# Patient Record
Sex: Male | Born: 1955 | Race: White | Hispanic: No | Marital: Married | Smoking: Current every day smoker
Health system: Southern US, Community
[De-identification: ages and names within clinical notes are randomized; demographics above are authoritative.]

## PROBLEM LIST (undated history)

## (undated) DIAGNOSIS — L03114 Cellulitis of left upper limb: Secondary | ICD-10-CM

## (undated) DIAGNOSIS — R7401 Elevation of levels of liver transaminase levels: Secondary | ICD-10-CM

## (undated) DIAGNOSIS — F1010 Alcohol abuse, uncomplicated: Secondary | ICD-10-CM

## (undated) DIAGNOSIS — F4321 Adjustment disorder with depressed mood: Secondary | ICD-10-CM

## (undated) DIAGNOSIS — K769 Liver disease, unspecified: Secondary | ICD-10-CM

## (undated) DIAGNOSIS — N529 Male erectile dysfunction, unspecified: Secondary | ICD-10-CM

## (undated) DIAGNOSIS — G629 Polyneuropathy, unspecified: Secondary | ICD-10-CM

## (undated) DIAGNOSIS — J439 Emphysema, unspecified: Secondary | ICD-10-CM

## (undated) DIAGNOSIS — R7309 Other abnormal glucose: Secondary | ICD-10-CM

## (undated) DIAGNOSIS — J449 Chronic obstructive pulmonary disease, unspecified: Secondary | ICD-10-CM

## (undated) DIAGNOSIS — R740 Nonspecific elevation of levels of transaminase and lactic acid dehydrogenase [LDH]: Secondary | ICD-10-CM

## (undated) DIAGNOSIS — K219 Gastro-esophageal reflux disease without esophagitis: Secondary | ICD-10-CM

## (undated) DIAGNOSIS — E781 Pure hyperglyceridemia: Secondary | ICD-10-CM

## (undated) DIAGNOSIS — T7840XA Allergy, unspecified, initial encounter: Secondary | ICD-10-CM

## (undated) DIAGNOSIS — Z720 Tobacco use: Secondary | ICD-10-CM

## (undated) DIAGNOSIS — I10 Essential (primary) hypertension: Secondary | ICD-10-CM

## (undated) DIAGNOSIS — F419 Anxiety disorder, unspecified: Secondary | ICD-10-CM

## (undated) HISTORY — DX: Alcohol abuse, uncomplicated: F10.10

## (undated) HISTORY — DX: Cellulitis of left upper limb: L03.114

## (undated) HISTORY — DX: Elevation of levels of liver transaminase levels: R74.01

## (undated) HISTORY — DX: Allergy, unspecified, initial encounter: T78.40XA

## (undated) HISTORY — PX: KNEE SURGERY: SHX244

## (undated) HISTORY — DX: Essential (primary) hypertension: I10

## (undated) HISTORY — DX: Nonspecific elevation of levels of transaminase and lactic acid dehydrogenase (ldh): R74.0

## (undated) HISTORY — DX: Gastro-esophageal reflux disease without esophagitis: K21.9

## (undated) HISTORY — PX: TONSILLECTOMY: SUR1361

## (undated) HISTORY — PX: EYE SURGERY: SHX253

## (undated) HISTORY — DX: Male erectile dysfunction, unspecified: N52.9

## (undated) HISTORY — DX: Pure hyperglyceridemia: E78.1

## (undated) HISTORY — DX: Adjustment disorder with depressed mood: F43.21

## (undated) HISTORY — DX: Tobacco use: Z72.0

## (undated) HISTORY — DX: Other abnormal glucose: R73.09

---

## 2008-11-07 ENCOUNTER — Ambulatory Visit: Payer: Self-pay | Admitting: Internal Medicine

## 2012-07-21 ENCOUNTER — Inpatient Hospital Stay: Payer: Self-pay | Admitting: Internal Medicine

## 2012-07-21 LAB — BASIC METABOLIC PANEL
Co2: 26 mmol/L (ref 21–32)
Creatinine: 0.76 mg/dL (ref 0.60–1.30)
EGFR (African American): >60
Glucose: 97 mg/dL (ref 65–99)
Potassium: 3.9 mmol/L (ref 3.5–5.1)

## 2012-07-21 LAB — CBC
HGB: 15.1 g/dL (ref 13.0–18.0)
RDW: 12.5 % (ref 11.5–14.5)
WBC: 7.9 10*3/uL (ref 3.8–10.6)

## 2012-07-21 LAB — TROPONIN I: Troponin-I: <0.02 ng/mL

## 2014-04-05 HISTORY — PX: SEPTOPLASTY: SUR1290

## 2014-04-09 ENCOUNTER — Ambulatory Visit: Payer: Self-pay | Admitting: Unknown Physician Specialty

## 2014-06-25 NOTE — Discharge Summary (Signed)
PATIENT NAME:  Devin HusbandsLAUGHLIN, Vennie MR#:  [id number] DATE OF BIRTH:  Mar 28, 1955  DATE OF ADMISSION:  07/21/2012 DATE OF DISCHARGE:  07/22/2012  ADMISSION DIAGNOSIS: Acute chronic obstructive pulmonary disease exacerbation.   DISCHARGE DIAGNOSES: 1.  Acute chronic obstructive pulmonary disease exacerbation with acute respiratory failure.  2.  Tobacco dependence.  3.  Otitis externa.   CONSULTS: None.   LABORATORIES AT DISCHARGE: There were no new laboratories. Please refer to the laboratories from the [facility].   HOSPITAL COURSE: This is a very pleasant 59 year old male who presents for shortness of breath. Found to have acute exacerbation of COPD along with otitis externa.   For further details, please refer to Dr. Riley [facility]arw[facility]'s [facility].   Acute COPD exacerbation: The patient was started on IV steroids, nebulizers, O2 as needed, and inhalers. He actually did quite well. His lungs were clear to auscultation without any wheezing, with good air movement. He is not requiring any oxygen. His steroids are being weaned. He will continue his inhalers.   Acute otitis externa: We started Cipro eardrops, which he will need for a total of 7 days, with followup by his primary care physician in about a week.   Tobacco dependence: The patient did not want a nicotine patch at discharge, nor in the hospital. He was encouraged to try to quit to quit smoking.   Anxiety: The patient was discharged with his outpatient medications.   DISCHARGE MEDICATIONS: 1. Paxil 10 mg daily.  2.  Klonopin  0.5 mg b.i.d. p.r.n., anxiety.  3.  Prilosec 40 mg daily.  4.  Multivitamin 1 tablet daily.  5.  Prednisone taper starting at 60 mg taper x 10 mg every 2 days.  6.  Ciprofloxacin/dexamethasone, 4 drops to both ears.    ____________________________ Janyth ContesSital P. Juliene PinaMody, MD spm:dm D: 07/22/2012 14:20:17 ET T: 07/22/2012 14:53:57 ET JOB#: [id number]  cc: Tiant Peixoto P. Juliene PinaMody, MD, <Dictator> Janyth ContesSITAL P Caidence Kaseman MD ELECTRONICALLY SIGNED  07/23/2012 12:24

## 2014-06-25 NOTE — Discharge Summary (Signed)
PATIENT NAME:  Devin Bryant, Devin Bryant MR#:  [id number] DATE OF BIRTH:  [birth year]  DATE OF ADMISSION:  07/21/2012 DATE OF DISCHARGE:  07/22/2012  ADDENDUM:  This is a continuation of transaction [phone].   MEDICATIONS: (Starting again) 1.  Paxil 10 mg daily.  2.  Klonopin 0.5 mg b.i.d. p.r.n. anxiety.  3.  Prilosec 40 mg daily.  4.  Multivitamin 1 tablet daily.  5.  Prednisone starting at 60 mg, taper by 10 mg every 2 days.  6.  Ciprofloxacin dexamethasone 4 drops to both ears x 6 days b.i.d.  7.  Levaquin 500 mg 25 hours x 3 days.  8.  Fluticasone salmeterol 100/50, 1 puff b.i.d.   DISCHARGE DIET: Regular. Discharge consistency regular.   DISCHARGE ACTIVITY: As tolerated.   FOLLOWUP: The patient will need to follow up with Dr. Dossie Arbourrissman in 1 to 2 weeks.    ____________________________ Janyth ContesSital P. Juliene PinaMody, MD spm:cb D: 07/22/2012 14:22:20 ET T: 07/22/2012 15:35:45 ET JOB#: [id number]  cc: Brendy Ficek P. Juliene PinaMody, MD, <Dictator> Janyth ContesSITAL P Stepahnie Campo MD ELECTRONICALLY SIGNED 07/23/2012 12:26

## 2014-06-25 NOTE — H&P (Signed)
PATIENT NAME:  Devin Bryant, Devin Bryant MR#:  [id number] DATE OF BIRTH:  April 22, 1955  DATE OF ADMISSION:  07/21/2012  PRIMARY CARE PHYSICIAN: [facility].   REFERRING PHYSICIAN: Lucrezia Europe, MD   CHIEF COMPLAINT: Increased shortness of breath and wheezing.   HISTORY OF PRESENT ILLNESS: Devin Bryant is a 59 year old pleasant Caucasian male with history of chronic smoking.  He was in his usual state of health until the last couple of days when he started to have cough associated with increased shortness of breath which had exacerbated to develop wheezing and progressive shortness of breath. He has no fever, no chills, no chest pain. The patient was brought to the Emergency Department for evaluation. His chest x-ray was negative for pneumonia.  Treated with bronchodilator therapy.  However, he had partial response to the treatment. His O2 saturation on room air was 87%.   REVIEW OF SYSTEMS: CONSTITUTIONAL: Denies any fever. No chills. No fatigue.  EYES: No blurring of vision. No double vision.  ENT: No hearing impairment. No sore throat. No dysphagia.  CARDIOVASCULAR: No chest pain. No palpitations. No syncope.  RESPIRATORY: Reports shortness of breath, wheezing, progressive dyspnea and cough, mostly dry but with occasional sputum production. No hemoptysis.  GASTROENTEROLOGY:  No abdominal pain. No vomiting. No diarrhea.  GENITOURINARY: No dysuria. No frequency of urination.  MUSCULOSKELETAL: No joint pain or swelling. No muscular pain or swelling.  INTEGUMENTARY: No skin rash. No ulcers.  NEUROLOGY: No focal weakness. No seizure activity. No headache.  PSYCHIATRY: No depression but he has anxiety.  ENDOCRINE: No polyuria or polydipsia. No heat or cold intolerance.   PAST MEDICAL HISTORY: Chronic smoking, anxiety, gastroesophageal reflux disease.   PAST SURGICAL HISTORY: Arthroscopic knee surgery.   FAMILY HISTORY: His mother died at age of 17.  She suffered from diabetes mellitus.  His father died at the age of 72 after having heart attack.   SOCIAL HISTORY: He is married, living with his wife. He owns a [employer].  SOCIAL HABITS: Chronic smoker, 1-1/2 packs per day since age of 35. He drinks vodka, 2 drinks a day. No other drug abuse.   ADMISSION MEDICATIONS:  1.  Paxil 10 mg a day. 2.  Klonopin 0.5 mg twice a day as needed. 3.  Prilosec 40 mg a day. 4.  Multivitamin once a day.   ALLERGIES: No known drug allergies.   PHYSICAL EXAMINATION: VITAL SIGNS: Blood pressure 146/81, respiratory rate 24.  This had improved down to 20 at the time of my examination. Pulse is 80 per minute, temperature 98, oxygen saturation was 87% on room air. With oxygen it was 91 to 93%.  GENERAL APPEARANCE: Middle-aged male lying in bed in no acute distress.  HEAD: No pallor. No icterus. No cyanosis.  EARS: Normal hearing.  The right ear, external auditory meatus, showing redness and debris consistent with otitis externa.  NOSE AND THROAT:  Nose no discharge, no ulcers.  OROPHARYNGEAL:  Normal lips and tongue. No oral thrush. No exudate.  EYE:  Normal iris and conjunctivae. Pupils about 5 mm, equal, round and reactive to light.  NECK: Supple. Trachea at midline. No thyromegaly. No cervical lymphadenopathy. No masses.  HEART: Normal S1 and S2. Distant heart sounds. No murmur was appreciated. No gallop. No carotid bruits.  LUNGS: Normal breathing pattern at time of my examination without use of accessory muscles. No rales but he has diffuse wheezing, both inspiratory and expiratory, but mainly expiratory with prolonged expiratory phase. No rales.  ABDOMEN: Soft without tenderness.  No hepatosplenomegaly. No masses. No hernias.  SKIN: No ulcers. No subcutaneous nodules.  MUSCULOSKELETAL: No joint swelling. No clubbing.  NEUROLOGIC: Cranial nerves II through XII are intact. No focal motor deficit.  PSYCHIATRIC: The patient is alert and oriented x 3. Mood and affect were normal.   LABORATORY  AND DIAGNOSTICS:  Radiology:  Chest x-ray showed hyperinflation of the chest consistent with emphysema. No consultation. No effusion. Heart size was normal.   EKG showed normal rhythm at rate of 80 per minute. No ischemic abnormalities.   Serum glucose 97, BUN 10, creatinine 0.7, sodium 139, potassium 3.9, and calcium 8.9. Troponin less than 0.02. CBC showed white count of 7000, hemoglobin 15, hematocrit 43, and platelet count 201. MCV was slightly elevated at 102 with normal MCHC.   ASSESSMENT: 1.  Acute exacerbation of chronic obstructive pulmonary disease.  2.  Tobacco abuse.  3.  Otitis externa. 4.  Moderate alcoholism.  5.  Anxiety disorder.  6.  Gastroesophageal reflux disease.   PLAN: We will admit the patient to the medical floor, intensify treatment with bronchodilator therapy using DuoNebs q. 4 hours, IV Solu-Medrol, and intravenous Levaquin which will cover for the acute exacerbation of chronic obstructive pulmonary disease and also for his otitis externa. The patient needs to quit smoking and he was counseled with discussion for about 2 minutes and I spoke about modalities of smoking cessation and options. I also had discussion with his wife who was asking about modalities to help him quit smoking. I offered nicotine patch, but the patient declined.  He felt that he would be fine here in the hospital without need for nicotine patch, but he will buy later the electronic cigarette. We need to watch him for any signs of agitation or withdrawal from alcohol, although he feels that he is not drinking much and he did not anticipate that he will have any problems from alcohol withdrawal. Nevertheless, I understood that sometimes he may drink more than just 2 drinks a day. Continue home medications as listed above.   TIME SPENT: In evaluating this patient took more than 55 minutes.  ____________________________ Carney CornersAmir M. Rudene Rearwish, MD amd:sb D: 07/21/2012 06:34:09 ET T: 07/21/2012 08:19:05  ET JOB#: [id number]  cc: Carney CornersAmir M. Rudene Rearwish, MD, <Dictator> Zollie ScaleAMIR M Tam Delisle MD ELECTRONICALLY SIGNED 07/27/2012 23:32

## 2014-06-28 LAB — SURGICAL PATHOLOGY

## 2014-08-21 ENCOUNTER — Emergency Department
Admission: EM | Admit: 2014-08-21 | Discharge: 2014-08-21 | Disposition: A | Discharge status: Home / Self Care | Payer: BLUE CROSS/BLUE SHIELD | Attending: Emergency Medicine | Admitting: Emergency Medicine

## 2014-08-21 ENCOUNTER — Encounter: Payer: Self-pay | Admitting: Emergency Medicine

## 2014-08-21 DIAGNOSIS — S61306A Unspecified open wound of right little finger with damage to nail, initial encounter: Secondary | ICD-10-CM | POA: Insufficient documentation

## 2014-08-21 DIAGNOSIS — X58XXXA Exposure to other specified factors, initial encounter: Secondary | ICD-10-CM | POA: Diagnosis not present

## 2014-08-21 DIAGNOSIS — Z79899 Other long term (current) drug therapy: Secondary | ICD-10-CM | POA: Insufficient documentation

## 2014-08-21 DIAGNOSIS — Y999 Unspecified external cause status: Secondary | ICD-10-CM | POA: Diagnosis not present

## 2014-08-21 DIAGNOSIS — S61209A Unspecified open wound of unspecified finger without damage to nail, initial encounter: Secondary | ICD-10-CM

## 2014-08-21 DIAGNOSIS — Z720 Tobacco use: Secondary | ICD-10-CM | POA: Diagnosis not present

## 2014-08-21 DIAGNOSIS — Y929 Unspecified place or not applicable: Secondary | ICD-10-CM | POA: Insufficient documentation

## 2014-08-21 DIAGNOSIS — Z7951 Long term (current) use of inhaled steroids: Secondary | ICD-10-CM | POA: Diagnosis not present

## 2014-08-21 DIAGNOSIS — Z7982 Long term (current) use of aspirin: Secondary | ICD-10-CM | POA: Insufficient documentation

## 2014-08-21 DIAGNOSIS — Y939 Activity, unspecified: Secondary | ICD-10-CM | POA: Insufficient documentation

## 2014-08-21 DIAGNOSIS — S6991XA Unspecified injury of right wrist, hand and finger(s), initial encounter: Secondary | ICD-10-CM | POA: Diagnosis present

## 2014-08-21 HISTORY — DX: Chronic obstructive pulmonary disease, unspecified: J44.9

## 2014-08-21 MED ORDER — LIDOCAINE HCL (PF) 1 % IJ SOLN
INTRAMUSCULAR | Status: AC
  Filled 2014-08-21: qty 5

## 2014-08-21 NOTE — ED Provider Notes (Signed)
[facility] Emergency Department Provider Note  ____________________________________________  Time seen: Approximately 7:33 AM  I have reviewed the triage vital signs and the nursing notes.   HISTORY  Chief Complaint Laceration    HPI Devin Bryant is a 59 y.o. male presented with a laceration to the fifth digit patient is a papular last night. He said unable to control the bleeding which is small bruising. His police bleeding is secondary to being on aspirin. Patient state his tetanus shot is up-to-date. Patient rated his pain discomfort as a 5/10.  Past Medical History  Diagnosis Date  . COPD (chronic obstructive pulmonary disease)     There are no active problems to display for this patient.   Past Surgical History  Procedure Laterality Date  . Septoplasty      Current Outpatient Rx  Name  Route  Sig  Dispense  Refill  . albuterol (PROVENTIL HFA;VENTOLIN HFA) 108 (90 BASE) MCG/ACT inhaler   Inhalation   Inhale into the lungs every 6 (six) hours as needed for wheezing or shortness of breath.         Marland Kitchen aspirin 81 MG tablet   Oral   Take 81 mg by mouth daily.         . budesonide-formoterol (SYMBICORT) 160-4.5 MCG/ACT inhaler   Inhalation   Inhale 2 puffs into the lungs 2 (two) times daily.         . clonazePAM (KLONOPIN) 0.5 MG tablet   Oral   Take 0.5 mg by mouth 2 (two) times daily as needed for anxiety.         Marland Kitchen omeprazole (PRILOSEC) 20 MG capsule   Oral   Take 20 mg by mouth daily.         Marland Kitchen PARoxetine (PAXIL) 20 MG tablet   Oral   Take 20 mg by mouth daily.           Allergies Review of patient's allergies indicates no known allergies.  No family history on file.  Social History History  Substance Use Topics  . Smoking status: Current Every Day Smoker  . Smokeless tobacco: Not on file  . Alcohol Use: Yes    Review of Systems Constitutional: No fever/chills Eyes: No visual changes. ENT: No sore  throat. Cardiovascular: Denies chest pain. Respiratory: Denies shortness of breath. Gastrointestinal: No abdominal pain.  No nausea, no vomiting.  No diarrhea.  No constipation. Genitourinary: Negative for dysuria. Musculoskeletal: Negative for back pain. Skin: Avulsion injury to the fifth digit right hand. Neurological: Negative for headaches, focal weakness or numbness.  10-point ROS otherwise negative.  ____________________________________________   PHYSICAL EXAM:  VITAL SIGNS: ED Triage Vitals  Enc Vitals Group     BP 08/21/14 0717 145/91 mmHg     Pulse Rate 08/21/14 0717 105     Resp 08/21/14 0717 20     Temp 08/21/14 0717 98.2 F (36.8 C)     Temp Source 08/21/14 0717 Oral     SpO2 08/21/14 0717 96 %     Weight 08/21/14 0717 175 lb (79.379 kg)     Height 08/21/14 0717 5\' 10"  (1.778 m)     Head Cir --      Peak Flow --      Pain Score 08/21/14 0718 5     Pain Loc --      Pain Edu? --      Excl. in GC? --     Constitutional: Alert and oriented. Well appearing and in  no acute distress. Eyes: Conjunctivae are normal. PERRL. EOMI. Head: Atraumatic. Nose: No congestion/rhinnorhea. Mouth/Throat: Mucous membranes are moist.  Oropharynx non-erythematous. Neck: No stridor.  No deformity for nuchal range of motion Hematological/Lymphatic/Immunilogical: No cervical lymphadenopathy. Cardiovascular: Normal rate, regular rhythm. Grossly normal heart sounds.  Good peripheral circulation. Respiratory: Normal respiratory effort.  No retractions. Lungs CTAB. Gastrointestinal: Soft and nontender. No distention. No abdominal bruits. No CVA tenderness. Musculoskeletal: No lower extremity tenderness nor edema.  No joint effusions. Neurologic:  Normal speech and language. No gross focal neurologic deficits are appreciated. Speech is normal. No gait instability. Skin:  Skin is warm, dry and intact. No rash noted. Avulsion injury lateral aspect of the fifth finger Psychiatric: Mood and  affect are normal. Speech and behavior are normal.  ____________________________________________   LABS (all labs ordered are listed, but only abnormal results are displayed)  Labs Reviewed - No data to display ____________________________________________  EKG   ____________________________________________  RADIOLOGY   ____________________________________________   PROCEDURES  Procedure(s) performed: None  Critical Care performed: No  ____________________________________________   INITIAL IMPRESSION / ASSESSMENT AND PLAN / ED COURSE  Pertinent labs & imaging results that were available during my care of the patient were reviewed by me and considered in my medical decision making (see chart for details).  Avulsion injury fifth finger. Area clean, Surgicel applied and pressure dressing. ____________________________________________   FINAL CLINICAL IMPRESSION(S) / ED DIAGNOSES  Final diagnoses:  Avulsion of skin of finger, initial encounter      Joni Reining, PA-C 08/21/14 0756  Joni Reining, PA-C 08/21/14 1610  Darci Current, MD 08/23/14 2246

## 2014-08-21 NOTE — Discharge Instructions (Signed)
Deep Skin Avulsion Change outer dressing daily.  Wear glove when showering.  A deep skin avulsion is when all layers of the skin or parts of body structures have been torn away. This is usually a result of severe injury (trauma). A deep skin avulsion can include damage to important structures beneath the skin such as tendons, ligaments, nerves, or blood vessels.  CAUSES  Many injuries can lead to a deep skin avulsion. These include:   Crush injuries.  Bites.  Falls against jagged surfaces.  Gunshot wounds.  Severe burns and injuries involving dragging (such as those from a bicycle or motorcycle accident). TREATMENT   If the wound is small and there is no damage to vital structures like nerves and blood vessels, the damaged tissues may be removed. Then, the wound can be cleaned thoroughly and closed.  A skin graft may be performed. This is a procedure in which the outer layer of skin is removed from a different part of your body. That skin (skin graft) is used to cover the open wound. This can happen after damaged tissue is removed and repairs are completed.  Your caregiver may onlyapply a bandage (dressing) to the wound. The wound will be kept clean and allowed to heal. Healing can take weeks or months and usually leaves a large scar. This type of treatment is only done if your caregiver feels that skin grafting or a similar procedure would not work. You might need a tetanus shot if:  You cannot remember when you had your last tetanus shot.  You have never had a tetanus shot.  The injury broke your skin. If you got a tetanus shot, your arm may swell, get red, and feel warm to the touch. This is common and not a problem. If you need a tetanus shot and you choose not to have one, there is a rare chance of getting tetanus. Sickness from tetanus can be serious. HOME CARE INSTRUCTIONS   Only take over-the-counter or prescription medicines for pain, discomfort, or fever as directed by  your caregiver.  Gently wash the area with mild soap and water 2 times a day, or as directed. Rinse off the soap. Pat the area dry with a clean towel. Do not rub the wound. This may cause bleeding.  Follow your caregiver's instructions for how often you need to change the dressing.  Apply ointment and a dressing to the wound as directed.  If the dressing sticks, moisten it with soapy water and gently remove it.  Change the bandage right away if it becomes wet, dirty, or starts to smell bad.  Take showers. Do not take tub baths, swim, or do anything that may soak the wound until it is healed.  Use anti-itch medicine as directed by your caregiver. The wound may itch when it is healing. Do not pick or scratch at the wound.  Follow up with your caregiver for stitches (sutures), staple, or skin adhesive strip removal. SEEK MEDICAL CARE IF:   You have redness, swelling, or increasing pain in your wound.  A red streak or line extends away from the wound.  You have pus coming from the wound.  You notice a bad smell coming from thewound or dressing.  The wound breaks open (edges not staying together) after sutures have been removed.  You notice something coming out of the wound, such as a small piece of wood, glass, or metal.  You are unable to properly move a finger or toe if the wound  is on your hand or foot.  You have severe swelling around the wound that causes pain and numbness.  Your arm, hand, leg, or foot changes color. SEEK IMMEDIATE MEDICAL CARE IF:   Your pain becomes severe or is not adequately relieved with pain medicine.  You have a fever.  You have nausea and vomiting for more than 24 hours.  You feel lightheaded, weak, or faint.  You develop chest pain or difficulty breathing. MAKE SURE YOU:   Understand these instructions.  Will watch your condition.  Will get help right away if you are not doing well or get worse. Document Released: 04/17/2006 Document  Revised: 05/14/2011 Document Reviewed: 06/25/2010 [facility] Patient Information 2015 [place], [place]. This information is not intended to replace advice given to you by your health care provider. Make sure you discuss any questions you have with your health care provider.

## 2014-08-21 NOTE — ED Notes (Signed)
Patient to ER with laceration to right 5th finger. States happened last night, unable to control bleeding this am. Denies being on blood thinners other than low dose ASA.

## 2014-10-13 DIAGNOSIS — N529 Male erectile dysfunction, unspecified: Secondary | ICD-10-CM | POA: Insufficient documentation

## 2014-10-13 DIAGNOSIS — T7840XA Allergy, unspecified, initial encounter: Secondary | ICD-10-CM | POA: Insufficient documentation

## 2014-10-13 DIAGNOSIS — R7309 Other abnormal glucose: Secondary | ICD-10-CM | POA: Insufficient documentation

## 2014-10-13 DIAGNOSIS — Z720 Tobacco use: Secondary | ICD-10-CM | POA: Insufficient documentation

## 2014-10-13 DIAGNOSIS — I10 Essential (primary) hypertension: Secondary | ICD-10-CM | POA: Insufficient documentation

## 2014-10-13 DIAGNOSIS — R7401 Elevation of levels of liver transaminase levels: Secondary | ICD-10-CM | POA: Insufficient documentation

## 2014-10-13 DIAGNOSIS — E781 Pure hyperglyceridemia: Secondary | ICD-10-CM | POA: Insufficient documentation

## 2014-10-13 DIAGNOSIS — J449 Chronic obstructive pulmonary disease, unspecified: Secondary | ICD-10-CM | POA: Insufficient documentation

## 2014-10-13 DIAGNOSIS — R740 Nonspecific elevation of levels of transaminase and lactic acid dehydrogenase [LDH]: Secondary | ICD-10-CM

## 2014-10-13 DIAGNOSIS — F4321 Adjustment disorder with depressed mood: Secondary | ICD-10-CM | POA: Insufficient documentation

## 2014-10-15 ENCOUNTER — Ambulatory Visit (INDEPENDENT_AMBULATORY_CARE_PROVIDER_SITE_OTHER): Payer: BLUE CROSS/BLUE SHIELD | Admitting: Family Medicine

## 2014-10-15 ENCOUNTER — Encounter: Payer: Self-pay | Admitting: Family Medicine

## 2014-10-15 VITALS — BP 115/74 | HR 115 | Temp 97.9°F | Ht 70.2 in | Wt 171.0 lb

## 2014-10-15 DIAGNOSIS — F411 Generalized anxiety disorder: Secondary | ICD-10-CM

## 2014-10-15 DIAGNOSIS — J441 Chronic obstructive pulmonary disease with (acute) exacerbation: Secondary | ICD-10-CM | POA: Diagnosis not present

## 2014-10-15 DIAGNOSIS — B370 Candidal stomatitis: Secondary | ICD-10-CM

## 2014-10-15 MED ORDER — ALBUTEROL SULFATE (2.5 MG/3ML) 0.083% IN NEBU: 2.5000 mg | INHALATION_SOLUTION | RESPIRATORY_TRACT | Status: DC | PRN

## 2014-10-15 MED ORDER — CLONAZEPAM 0.5 MG PO TABS: 0.5000 mg | ORAL_TABLET | Freq: Two times a day (BID) | ORAL | Status: DC | PRN

## 2014-10-15 MED ORDER — UMECLIDINIUM BROMIDE 62.5 MCG/INH IN AEPB: 1.0000 | INHALATION_SPRAY | Freq: Every day | RESPIRATORY_TRACT | Status: DC

## 2014-10-15 MED ORDER — DOXYCYCLINE HYCLATE 100 MG PO TABS: 100.0000 mg | ORAL_TABLET | Freq: Two times a day (BID) | ORAL | Status: DC

## 2014-10-15 NOTE — Patient Instructions (Signed)
Start probiotics daily for about the next month Start the new antibiotic Use the new inhaler (sample) if needed while you are being sparing with the Advair Rest and hydration Vitamin C 1000 mg daily while you are ill Call or seek medication attention if needed

## 2014-10-15 NOTE — Progress Notes (Signed)
BP 115/74 mmHg  Pulse 115  Temp(Src) 97.9 F (36.6 C)  Ht 5' 10.2" (1.783 m)  Wt 171 lb (77.565 kg)  BMI 24.40 kg/m2  SpO2 91%   Subjective:    Patient ID: Devin Bryant, male    DOB: 05-Oct-1955, 59 y.o.   MRN: [id number]  HPI: Devin Bryant is a 59 y.o. male  Chief Complaint  Patient presents with  . URI    has been using neb at home  . Medication Refill   He has seen ENT and has thrush all over his vocal cords, it is all over; just covered; ENT started him on medicine He was on a breathing med that increased risk and ENT switched it No cancer and no nodules; getting better now Going on for 90 days or so [employer] made him switch inhalers before this happened He gave him 200 mg of diflucan for 14 days; he asked him to not use COPD medicine He is on the edge of bronchitis; using SABA 2-3 times a day; normally only 1x a week He is having yellow drainage and feels this like this infection No visits to nursing homes or hospitals He requested a refill of his anti-anxiety medicine  Relevant past medical, surgical, family and social history reviewed and updated as indicated. Interim medical history since our last visit reviewed. Allergies and medications reviewed and updated.  Review of Systems  Constitutional: Negative for fever.  HENT: Positive for sore throat, trouble swallowing and voice change.   Respiratory: Positive for cough, shortness of breath and wheezing.   Hematological: Positive for adenopathy (cervical range).   Per HPI unless specifically indicated above     Objective:    BP 115/74 mmHg  Pulse 115  Temp(Src) 97.9 F (36.6 C)  Ht 5' 10.2" (1.783 m)  Wt 171 lb (77.565 kg)  BMI 24.40 kg/m2  SpO2 91%  Wt Readings from Last 3 Encounters:  10/17/14 170 lb (77.111 kg)  10/15/14 171 lb (77.565 kg)  05/10/14 175 lb (79.379 kg)    Physical Exam  Constitutional: He appears well-developed and well-nourished. No distress.  HENT:  Head:  Normocephalic and atraumatic.  Eyes: EOM are normal. Right eye exhibits no discharge. Left eye exhibits no discharge. No scleral icterus.  Neck: Neck supple.  Cardiovascular: Regular rhythm.   No extrasystoles are present. Tachycardia present.   Pulmonary/Chest: Effort normal and breath sounds normal. No accessory muscle usage. No respiratory distress. He has no decreased breath sounds.  Lymphadenopathy:    He has no cervical adenopathy.  Skin: No rash noted. He is not diaphoretic. No pallor.  Psychiatric: His mood appears anxious (slightly anxious in appearance, but good eye contact with examiner).      Assessment & Plan:   Problem List Items Addressed This Visit      Respiratory   COPD with acute exacerbation - Primary    Will start antibiotics and add inhaler to use while he has stopped the inhaled corticosteroid; I opted against oral steroids given the extent of his thrush, and will only use that if absolutely necessary, obviously; he has rescue inhaler at home      Relevant Medications   ADVAIR HFA 115-21 MCG/ACT inhaler   Umeclidinium Bromide (INCRUSE ELLIPTA) 62.5 MCG/INH AEPB   albuterol (PROVENTIL) (2.5 MG/3ML) 0.083% nebulizer solution     Digestive   Oral thrush    Seen and diagnosed by ENT; continue diflucan per ENT's instructions; likely secondary to inhaled corticosteroid which has been stopped  for now      Relevant Medications   fluconazole (DIFLUCAN) 200 MG tablet     Other   Anxiety disorder    He promises he is not getting benzo from any other source; do not mix with other controlled substances; refill provided          Follow up plan: Return if symptoms worsen or fail to improve.

## 2014-10-17 ENCOUNTER — Emergency Department: Payer: BLUE CROSS/BLUE SHIELD

## 2014-10-17 ENCOUNTER — Inpatient Hospital Stay
Admission: EM | Admit: 2014-10-17 | Discharge: 2014-10-19 | DRG: 190 | Disposition: A | Discharge status: Home / Self Care | Payer: BLUE CROSS/BLUE SHIELD | Attending: Internal Medicine | Admitting: Internal Medicine

## 2014-10-17 DIAGNOSIS — F4321 Adjustment disorder with depressed mood: Secondary | ICD-10-CM | POA: Diagnosis present

## 2014-10-17 DIAGNOSIS — J9601 Acute respiratory failure with hypoxia: Secondary | ICD-10-CM

## 2014-10-17 DIAGNOSIS — J441 Chronic obstructive pulmonary disease with (acute) exacerbation: Secondary | ICD-10-CM | POA: Diagnosis present

## 2014-10-17 DIAGNOSIS — F411 Generalized anxiety disorder: Secondary | ICD-10-CM | POA: Insufficient documentation

## 2014-10-17 DIAGNOSIS — I10 Essential (primary) hypertension: Secondary | ICD-10-CM | POA: Diagnosis present

## 2014-10-17 DIAGNOSIS — F17210 Nicotine dependence, cigarettes, uncomplicated: Secondary | ICD-10-CM | POA: Diagnosis present

## 2014-10-17 DIAGNOSIS — J9600 Acute respiratory failure, unspecified whether with hypoxia or hypercapnia: Secondary | ICD-10-CM | POA: Diagnosis present

## 2014-10-17 DIAGNOSIS — Z7982 Long term (current) use of aspirin: Secondary | ICD-10-CM

## 2014-10-17 DIAGNOSIS — B370 Candidal stomatitis: Secondary | ICD-10-CM | POA: Diagnosis present

## 2014-10-17 DIAGNOSIS — E785 Hyperlipidemia, unspecified: Secondary | ICD-10-CM | POA: Diagnosis present

## 2014-10-17 DIAGNOSIS — Z833 Family history of diabetes mellitus: Secondary | ICD-10-CM

## 2014-10-17 DIAGNOSIS — K219 Gastro-esophageal reflux disease without esophagitis: Secondary | ICD-10-CM | POA: Diagnosis present

## 2014-10-17 DIAGNOSIS — Z801 Family history of malignant neoplasm of trachea, bronchus and lung: Secondary | ICD-10-CM

## 2014-10-17 DIAGNOSIS — Z720 Tobacco use: Secondary | ICD-10-CM | POA: Diagnosis present

## 2014-10-17 HISTORY — DX: Anxiety disorder, unspecified: F41.9

## 2014-10-17 LAB — CBC
HCT: 45.1 % (ref 40.0–52.0)
Hemoglobin: 15.2 g/dL (ref 13.0–18.0)
MCH: 36.6 pg — AB (ref 26.0–34.0)
MCHC: 33.7 g/dL (ref 32.0–36.0)
MCV: 108.5 fL — ABNORMAL HIGH (ref 80.0–100.0)
Platelets: 255 10*3/uL (ref 150–440)
RBC: 4.15 MIL/uL — AB (ref 4.40–5.90)
RDW: 14 % (ref 11.5–14.5)
WBC: 9.4 10*3/uL (ref 3.8–10.6)

## 2014-10-17 LAB — COMPREHENSIVE METABOLIC PANEL
ALT: 58 U/L (ref 17–63)
AST: 56 U/L — ABNORMAL HIGH (ref 15–41)
Albumin: 4.2 g/dL (ref 3.5–5.0)
Alkaline Phosphatase: 74 U/L (ref 38–126)
Anion gap: 10 (ref 5–15)
BUN: 6 mg/dL (ref 6–20)
CO2: 29 mmol/L (ref 22–32)
Calcium: 9.2 mg/dL (ref 8.9–10.3)
Chloride: 100 mmol/L — ABNORMAL LOW (ref 101–111)
Creatinine, Ser: 0.79 mg/dL (ref 0.61–1.24)
GLUCOSE: 128 mg/dL — AB (ref 65–99)
Potassium: 4.1 mmol/L (ref 3.5–5.1)
SODIUM: 139 mmol/L (ref 135–145)
Total Bilirubin: 0.8 mg/dL (ref 0.3–1.2)
Total Protein: 7.7 g/dL (ref 6.5–8.1)

## 2014-10-17 LAB — BLOOD GAS, ARTERIAL
ALLENS TEST (PASS/FAIL): POSITIVE — AB
Acid-Base Excess: 0.8 mmol/L (ref 0.0–3.0)
Bicarbonate: 26.6 mEq/L (ref 21.0–28.0)
FIO2: 0.3
O2 SAT: 91.4 %
PATIENT TEMPERATURE: 37
pCO2 arterial: 46 mmHg (ref 32.0–48.0)
pH, Arterial: 7.37 (ref 7.350–7.450)
pO2, Arterial: 64 mmHg — ABNORMAL LOW (ref 83.0–108.0)

## 2014-10-17 LAB — TROPONIN I: Troponin I: <0.03 ng/mL (ref <0.031)

## 2014-10-17 LAB — MAGNESIUM: Magnesium: 1.5 mg/dL — ABNORMAL LOW (ref 1.7–2.4)

## 2014-10-17 MED ORDER — FLUCONAZOLE 100 MG PO TABS
200.0000 mg | ORAL_TABLET | Freq: Every day | ORAL | Status: DC
  Administered 2014-10-17 – 2014-10-19 (×3): 200 mg via ORAL
  Filled 2014-10-17 (×3): qty 2
  Filled 2014-10-17: qty 1

## 2014-10-17 MED ORDER — NICOTINE 14 MG/24HR TD PT24
14.0000 mg | MEDICATED_PATCH | Freq: Every day | TRANSDERMAL | Status: DC
  Administered 2014-10-17 – 2014-10-19 (×3): 14 mg via TRANSDERMAL
  Filled 2014-10-17 (×3): qty 1

## 2014-10-17 MED ORDER — PREDNISONE 20 MG PO TABS
40.0000 mg | ORAL_TABLET | Freq: Every day | ORAL | Status: DC
  Administered 2014-10-19: 40 mg via ORAL
  Filled 2014-10-17: qty 2

## 2014-10-17 MED ORDER — ALBUTEROL SULFATE (2.5 MG/3ML) 0.083% IN NEBU: 3.0000 mL | INHALATION_SOLUTION | Freq: Four times a day (QID) | RESPIRATORY_TRACT | Status: DC | PRN

## 2014-10-17 MED ORDER — ALBUTEROL SULFATE (2.5 MG/3ML) 0.083% IN NEBU: 2.5000 mg | INHALATION_SOLUTION | RESPIRATORY_TRACT | Status: DC | PRN

## 2014-10-17 MED ORDER — HEPARIN SODIUM (PORCINE) 5000 UNIT/ML IJ SOLN
5000.0000 [IU] | Freq: Three times a day (TID) | INTRAMUSCULAR | Status: DC
  Administered 2014-10-17 – 2014-10-19 (×5): 5000 [IU] via SUBCUTANEOUS
  Filled 2014-10-17 (×5): qty 1

## 2014-10-17 MED ORDER — ACETAMINOPHEN 650 MG RE SUPP: 650.0000 mg | Freq: Four times a day (QID) | RECTAL | Status: DC | PRN

## 2014-10-17 MED ORDER — CLONAZEPAM 0.5 MG PO TABS
0.5000 mg | ORAL_TABLET | Freq: Two times a day (BID) | ORAL | Status: DC | PRN
  Administered 2014-10-17 – 2014-10-19 (×5): 0.5 mg via ORAL
  Filled 2014-10-17 (×6): qty 1

## 2014-10-17 MED ORDER — METHYLPREDNISOLONE SODIUM SUCC 125 MG IJ SOLR
60.0000 mg | Freq: Four times a day (QID) | INTRAMUSCULAR | Status: AC
  Administered 2014-10-17 – 2014-10-18 (×4): 60 mg via INTRAVENOUS
  Filled 2014-10-17 (×4): qty 2

## 2014-10-17 MED ORDER — IPRATROPIUM-ALBUTEROL 0.5-2.5 (3) MG/3ML IN SOLN: 3.0000 mL | RESPIRATORY_TRACT | Status: DC | PRN

## 2014-10-17 MED ORDER — GUAIFENESIN-CODEINE 100-10 MG/5ML PO SOLN
10.0000 mL | ORAL | Status: DC | PRN
  Administered 2014-10-17 – 2014-10-18 (×3): 10 mL via ORAL
  Filled 2014-10-17 (×3): qty 10

## 2014-10-17 MED ORDER — ONDANSETRON HCL 4 MG PO TABS: 4.0000 mg | ORAL_TABLET | Freq: Four times a day (QID) | ORAL | Status: DC | PRN

## 2014-10-17 MED ORDER — MOMETASONE FUROATE 0.1 % EX OINT: 1.0000 "application " | TOPICAL_OINTMENT | Freq: Every day | CUTANEOUS | Status: DC | PRN

## 2014-10-17 MED ORDER — ONDANSETRON HCL 4 MG/2ML IJ SOLN: 4.0000 mg | Freq: Four times a day (QID) | INTRAMUSCULAR | Status: DC | PRN

## 2014-10-17 MED ORDER — BUDESONIDE 0.25 MG/2ML IN SUSP
0.2500 mg | Freq: Two times a day (BID) | RESPIRATORY_TRACT | Status: DC
  Administered 2014-10-17 – 2014-10-18 (×2): 0.25 mg via RESPIRATORY_TRACT
  Filled 2014-10-17 (×2): qty 2

## 2014-10-17 MED ORDER — FLUTICASONE PROPIONATE 50 MCG/ACT NA SUSP
2.0000 | Freq: Every day | NASAL | Status: DC
  Administered 2014-10-17 – 2014-10-19 (×4): 2 via NASAL
  Filled 2014-10-17: qty 16

## 2014-10-17 MED ORDER — BENZONATATE 100 MG PO CAPS
100.0000 mg | ORAL_CAPSULE | Freq: Three times a day (TID) | ORAL | Status: DC | PRN
  Administered 2014-10-17: 100 mg via ORAL
  Filled 2014-10-17 (×2): qty 1

## 2014-10-17 MED ORDER — PAROXETINE HCL 20 MG PO TABS
20.0000 mg | ORAL_TABLET | Freq: Every day | ORAL | Status: DC
  Administered 2014-10-17 – 2014-10-19 (×3): 20 mg via ORAL
  Filled 2014-10-17 (×4): qty 1

## 2014-10-17 MED ORDER — IPRATROPIUM-ALBUTEROL 0.5-2.5 (3) MG/3ML IN SOLN
3.0000 mL | Freq: Four times a day (QID) | RESPIRATORY_TRACT | Status: DC
  Administered 2014-10-17 – 2014-10-18 (×3): 3 mL via RESPIRATORY_TRACT
  Filled 2014-10-17 (×3): qty 3

## 2014-10-17 MED ORDER — SODIUM CHLORIDE 0.9 % IJ SOLN: 3.0000 mL | INTRAMUSCULAR | Status: DC | PRN

## 2014-10-17 MED ORDER — ACETAMINOPHEN 325 MG PO TABS: 650.0000 mg | ORAL_TABLET | Freq: Four times a day (QID) | ORAL | Status: DC | PRN

## 2014-10-17 MED ORDER — PANTOPRAZOLE SODIUM 40 MG PO TBEC
40.0000 mg | DELAYED_RELEASE_TABLET | Freq: Every day | ORAL | Status: DC
  Administered 2014-10-17 – 2014-10-19 (×3): 40 mg via ORAL
  Filled 2014-10-17 (×3): qty 1

## 2014-10-17 MED ORDER — SODIUM CHLORIDE 0.9 % IV SOLN: 250.0000 mL | INTRAVENOUS | Status: DC | PRN

## 2014-10-17 MED ORDER — MOMETASONE FURO-FORMOTEROL FUM 100-5 MCG/ACT IN AERO
2.0000 | INHALATION_SPRAY | Freq: Two times a day (BID) | RESPIRATORY_TRACT | Status: DC
  Filled 2014-10-17: qty 8.8

## 2014-10-17 MED ORDER — MOMETASONE FUROATE 0.1 % EX CREA
TOPICAL_CREAM | Freq: Every day | CUTANEOUS | Status: DC | PRN
  Filled 2014-10-17: qty 15

## 2014-10-17 MED ORDER — LORATADINE 10 MG PO TABS
10.0000 mg | ORAL_TABLET | Freq: Every day | ORAL | Status: DC
  Administered 2014-10-17 – 2014-10-19 (×3): 10 mg via ORAL
  Filled 2014-10-17 (×3): qty 1

## 2014-10-17 MED ORDER — MAGNESIUM SULFATE 2 GM/50ML IV SOLN
2.0000 g | Freq: Once | INTRAVENOUS | Status: AC
  Administered 2014-10-17: 2 g via INTRAVENOUS
  Filled 2014-10-17: qty 50

## 2014-10-17 MED ORDER — ASPIRIN EC 81 MG PO TBEC
81.0000 mg | DELAYED_RELEASE_TABLET | Freq: Every day | ORAL | Status: DC
  Administered 2014-10-17 – 2014-10-19 (×3): 81 mg via ORAL
  Filled 2014-10-17 (×3): qty 1

## 2014-10-17 MED ORDER — TIOTROPIUM BROMIDE MONOHYDRATE 18 MCG IN CAPS
18.0000 ug | ORAL_CAPSULE | Freq: Every day | RESPIRATORY_TRACT | Status: DC
  Filled 2014-10-17: qty 5

## 2014-10-17 MED ORDER — UMECLIDINIUM BROMIDE 62.5 MCG/INH IN AEPB: 1.0000 | INHALATION_SPRAY | Freq: Every day | RESPIRATORY_TRACT | Status: DC

## 2014-10-17 MED ORDER — SODIUM CHLORIDE 0.9 % IJ SOLN
3.0000 mL | Freq: Two times a day (BID) | INTRAMUSCULAR | Status: DC
  Administered 2014-10-17 – 2014-10-18 (×3): 3 mL via INTRAVENOUS

## 2014-10-17 MED ORDER — IPRATROPIUM-ALBUTEROL 0.5-2.5 (3) MG/3ML IN SOLN
3.0000 mL | RESPIRATORY_TRACT | Status: AC
  Administered 2014-10-17 (×3): 3 mL via RESPIRATORY_TRACT
  Filled 2014-10-17 (×3): qty 3

## 2014-10-17 MED ORDER — AZITHROMYCIN 250 MG PO TABS
500.0000 mg | ORAL_TABLET | Freq: Every day | ORAL | Status: DC
  Administered 2014-10-17 – 2014-10-19 (×3): 500 mg via ORAL
  Filled 2014-10-17 (×3): qty 2

## 2014-10-17 NOTE — ED Notes (Signed)
Pt c/o of SOB apx 1 month long worsening severely this am. Has taken Levaquin and is currently taking doxycycline outpatient without relief. Hx COPD. Take albuterol treatments at home. Given 125 solumedrol and 2 duonebs PTA by EMS. Rescue squad reports 02 sat 82% initially, placed on NRB with improvement to 98%. Wheezing noted diffusely.

## 2014-10-17 NOTE — Progress Notes (Signed)
Reviewed use of home svn & mdi's.  Pt demonstrated good comprehension of their proper use.

## 2014-10-17 NOTE — Care Management Note (Signed)
Case Management Note  Patient Details  Name: Devin Bryant MRN: [id number] Date of Birth: Jun 12, 1955  Subjective/Objective:     59yo Mr Devin Bryant was admitted 10/17/14 with shortness of breath from a COPD exacerbation. PCP=Dr Lata at [facility]. Pharmacy[facility] [place]. Resides at home with his wife. Reports no home oxygen and no home assistive equipment. Drives himself to his appointments. Per 3 hospital admissions in the past 6 months, will need a COPD GOLD referral. Do not anticipate any home health needs at this time unless he is unable to wean off oxygen prior to discharge.               Action/Plan:   Expected Discharge Date:                  Expected Discharge Plan:     In-House Referral:     Discharge planning Services     Post Acute Care Choice:    Choice offered to:     DME Arranged:    DME Agency:     HH Arranged:    HH Agency:     Status of Service:     Medicare Important Message Given:    Date Medicare IM Given:    Medicare IM give by:    Date Additional Medicare IM Given:    Additional Medicare Important Message give by:     If discussed at Long Length of Stay Meetings, dates discussed:    Additional Comments:  Devin Bryant A, RN 10/17/2014, 5:08 PM

## 2014-10-17 NOTE — Assessment & Plan Note (Signed)
Will start antibiotics and add inhaler to use while he has stopped the inhaled corticosteroid; I opted against oral steroids given the extent of his thrush, and will only use that if absolutely necessary, obviously; he has rescue inhaler at home

## 2014-10-17 NOTE — Assessment & Plan Note (Signed)
Seen and diagnosed by ENT; continue diflucan per ENT's instructions; likely secondary to inhaled corticosteroid which has been stopped for now

## 2014-10-17 NOTE — Assessment & Plan Note (Signed)
He promises he is not getting benzo from any other source; do not mix with other controlled substances; refill provided

## 2014-10-17 NOTE — ED Provider Notes (Signed)
[facility] Emergency Department Provider Note  ____________________________________________  Time seen: Approximately 9:18 AM  I have reviewed the triage vital signs and the nursing notes.   HISTORY  Chief Complaint Shortness of Breath    HPI Devin Bryant is a 59 y.o. male with history of COPD with persistent tobacco abuse but he does not use oxygen at home.  He has had multiple episodes of COPD exacerbations/bronchitisin the last several weeks.  He is currently taking doxycycline.  He uses albuterol nebulizers and inhalers at home and has increased their usage recently but it is not helping.  He reports that his shortness of breath gets severely worse with even minimal exertion.  His wife states that she thinks he has been confused or "altered" because last night he was wandering around the house and left without turning off the alarm which is very out of character for him.  The symptoms today occurred fairly acutely after waking up this morning.  He has a pulse oximeter at home and his oxygen was in the low 80s.  EMS arrived and put him on a nonrebreather which improved the oxygenation to the upper 80s.  He was severely wheezing and retracting.  En route to the hospital he received Solu-Medrol 125 mg IV and 2 DuoNebs.  He is feeling a little bit better now and his oxygenation has improved on the nonrebreather to 98%, but he is still to In the 30s and retracting.  He denies chest pain, nausea/vomiting, and fever/chills.  He has seen his primary care doctor and an ENT doctor recently.   Past Medical History  Diagnosis Date  . Allergy   . Hypertension   . COPD (chronic obstructive pulmonary disease)   . GERD (gastroesophageal reflux disease)   . Adjustment disorder with depressed mood   . ED (erectile dysfunction)   . Tobacco use   . Hypertriglyceridemia   . Elevated glucose   . Elevated serum glutamic pyruvic transaminase (SGPT) level     Patient  Active Problem List   Diagnosis Date Noted  . Acute respiratory failure 10/17/2014  . Oral thrush 10/15/2014  . COPD with acute exacerbation 10/15/2014  . Allergy   . Hypertension   . COPD (chronic obstructive pulmonary disease)   . Adjustment disorder with depressed mood   . ED (erectile dysfunction)   . Tobacco use   . Hypertriglyceridemia   . Elevated glucose   . Elevated serum glutamic pyruvic transaminase (SGPT) level     Past Surgical History  Procedure Laterality Date  . Septoplasty  Feb 2016  . Knee surgery      arthroscopic    Current Outpatient Rx  Name  Route  Sig  Dispense  Refill  . ADVAIR HFA 115-21 MCG/ACT inhaler   Inhalation   Inhale 2 puffs into the lungs 2 (two) times daily.            Dispense as written.   Marland Kitchen albuterol (PROVENTIL HFA;VENTOLIN HFA) 108 (90 BASE) MCG/ACT inhaler   Inhalation   Inhale 2 puffs into the lungs every 6 (six) hours as needed for wheezing or shortness of breath.          Marland Kitchen albuterol (PROVENTIL) (2.5 MG/3ML) 0.083% nebulizer solution   Nebulization   Take 3 mLs (2.5 mg total) by nebulization every 4 (four) hours as needed for wheezing or shortness of breath.   50 mL   3   . aspirin EC 81 MG tablet   Oral  Take 81 mg by mouth daily.         . Cetirizine HCl (ZYRTEC ALLERGY) 10 MG CAPS   Oral   Take 10 mg by mouth daily.         . clonazePAM (KLONOPIN) 0.5 MG tablet   Oral   Take 1 tablet (0.5 mg total) by mouth 2 (two) times daily as needed for anxiety.   45 tablet   0   . doxycycline (VIBRA-TABS) 100 MG tablet   Oral   Take 1 tablet (100 mg total) by mouth 2 (two) times daily.   20 tablet   0   . esomeprazole (NEXIUM) 20 MG capsule   Oral   Take 20 mg by mouth daily at 12 noon.         . fluconazole (DIFLUCAN) 200 MG tablet   Oral   Take 200 mg by mouth daily.         . fluticasone (FLONASE) 50 MCG/ACT nasal spray   Each Nare   Place 2 sprays into both nostrils daily.          .  mometasone (ELOCON) 0.1 % ointment   Topical   Apply 1 application topically daily as needed (for ear.). Apply to each ear 1 drop daily as needed         . Multiple Vitamin (MULTIVITAMIN) tablet   Oral   Take 1 tablet by mouth daily.         Marland Kitchen PARoxetine (PAXIL) 20 MG tablet   Oral   Take 20 mg by mouth daily.         Marland Kitchen Umeclidinium Bromide (INCRUSE ELLIPTA) 62.5 MCG/INH AEPB   Inhalation   Inhale 1 Inhaler into the lungs daily.   1 each   0     Allergies Review of patient's allergies indicates no known allergies.  Family History  Problem Relation Age of Onset  . Diabetes Mother   . Cancer Father     lung    Social History Social History  Substance Use Topics  . Smoking status: Current Every Day Smoker    Types: Cigarettes  . Smokeless tobacco: Never Used  . Alcohol Use: Yes    Review of Systems Constitutional: No fever/chills Eyes: No visual changes. ENT: Is currently using nystatin for thrush Cardiovascular: Denies chest pain. Respiratory: Severe shortness of breath Gastrointestinal: No abdominal pain.  No nausea, no vomiting.  No diarrhea.  No constipation. Genitourinary: Negative for dysuria. Musculoskeletal: Negative for back pain. Skin: Negative for rash. Neurological: Negative for headaches, focal weakness or numbness.  Possible altered mental status recently  10-point ROS otherwise negative.  ____________________________________________   PHYSICAL EXAM:  ED Triage Vitals  Enc Vitals Group     BP 10/17/14 0921 160/91 mmHg     Pulse Rate 10/17/14 0921 116     Resp 10/17/14 0921 32     Temp 10/17/14 0921 98.2 F (36.8 C)     Temp Source 10/17/14 0921 Oral     SpO2 10/17/14 0921 94 %     Weight 10/17/14 0921 170 lb (77.111 kg)     Height 10/17/14 0921 5\' 10"  (1.778 m)     Head Cir --      Peak Flow --      Pain Score --      Pain Loc --      Pain Edu? --      Excl. in GC? --     Constitutional: Alert and oriented but  in moderate  distress with retractions Eyes: Conjunctivae are normal. PERRL. EOMI. Head: Atraumatic. Nose: No congestion/rhinnorhea. Mouth/Throat: Mucous membranes are dry.  Oropharynx non-erythematous. Neck: No stridor. Cardiovascular: Tachycardia in the 120s, regular rhythm. Grossly normal heart sounds.  Good peripheral circulation. Respiratory: Increased respiratory effort with tachypnea in the 30s, retractions, accessory muscle usage.  Severe wheezing throughout lung fields.  Prolonged expiratory phase Gastrointestinal: Soft and nontender. No distention. No abdominal bruits. No CVA tenderness. Musculoskeletal: No lower extremity tenderness nor edema.  No joint effusions. Neurologic:  Normal speech and language. No gross focal neurologic deficits are appreciated.  Skin:  Skin is warm, dry and intact. No rash noted. Psychiatric: Mood and affect are normal. Speech and behavior are normal.  ____________________________________________   LABS (all labs ordered are listed, but only abnormal results are displayed)  Labs Reviewed  CBC - Abnormal; Notable for the following:    RBC 4.15 (*)    MCV 108.5 (*)    MCH 36.6 (*)    All other components within normal limits  COMPREHENSIVE METABOLIC PANEL - Abnormal; Notable for the following:    Chloride 100 (*)    Glucose, Bld 128 (*)    AST 56 (*)    All other components within normal limits  BLOOD GAS, ARTERIAL - Abnormal; Notable for the following:    pO2, Arterial 64 (*)    Allens test (pass/fail) POSITIVE (*)    All other components within normal limits  MAGNESIUM - Abnormal; Notable for the following:    Magnesium 1.5 (*)    All other components within normal limits  TROPONIN I   ____________________________________________  EKG  ED ECG REPORT I, Nasreen Goedecke, the attending physician, personally viewed and interpreted this ECG.  Date: 10/17/2014 EKG Time: 09:23 Rate: 119 Rhythm: Sinus tachycardia QRS Axis: normal Intervals:  normal ST/T Wave abnormalities: Non-specific ST segment / T-wave changes, but no evidence of acute ischemia. Conduction Disutrbances: none Narrative Interpretation: unremarkable  ____________________________________________  RADIOLOGY  Dg Chest Port 1 View  10/17/2014   CLINICAL DATA:  Shortness of breath.  EXAM: PORTABLE CHEST - 1 VIEW  COMPARISON:  None.  FINDINGS: The heart size and mediastinal contours are within normal limits. Both lungs are clear. No pneumothorax or pleural effusion is noted. The visualized skeletal structures are unremarkable.  IMPRESSION: No acute cardiopulmonary abnormality seen.   Electronically Signed   By: Lupita Raider, M.D.   On: 10/17/2014 10:04    ____________________________________________   PROCEDURES  Procedure(s) performed: None  Critical Care performed: Yes, see critical care note(s)   CRITICAL CARE Performed by: Loleta Rose   Total critical care time: 45 minutes  Critical care time was exclusive of separately billable procedures and treating other patients.  Critical care was necessary to treat or prevent imminent or life-threatening deterioration.  Critical care was time spent personally by me on the following activities: development of treatment plan with patient and/or surrogate as well as nursing, discussions with consultants, evaluation of patient's response to treatment, examination of patient, obtaining history from patient or surrogate, ordering and performing treatments and interventions, ordering and review of laboratory studies, ordering and review of radiographic studies, pulse oximetry and re-evaluation of patient's condition.  ____________________________________________   INITIAL IMPRESSION / ASSESSMENT AND PLAN / ED COURSE  Pertinent labs & imaging results that were available during my care of the patient were reviewed by me and considered in my medical decision making (see chart for details).  The patient is in  moderate respiratory distress with retractions and severe wheezing after having to do in the field and Solu-Medrol.  His oxygenation has improved significantly but he was for family hypoxemic at 82% at home.  Given that he has been short of breath for quite some time and does appear somewhat fatigued, I will aggressively treat with the BiPAP per respiratory therapist, obtained an ABG to evaluate a true PCO2 given his possible altered mental status to evaluate for hypercapnia, and we will proceed with treatment for severe COPD exacerbation.  He has no infectious symptoms at this time and is not having any chest pain with no evidence of acute ischemia on EKG.  I am giving him another 3 DuoNeb labs.  He received Solu-Medrol prior to arrival.   (Note that documentation was delayed due to multiple ED patients requiring immediate care.)   Upon reassessment, the patient is breathing more comfortably on BiPAP with very minimal retractions.  He states that subjectively he feels better.  His heart rate has dropped down to around 100.  His ABG was generally reassuring with only slight hypercapnia.  He has less wheezing on auscultation.  I will continue treatment and admitted to the hospital for acute COPD exacerbation/respiratory failure with hypoxemia.   ____________________________________________  FINAL CLINICAL IMPRESSION(S) / ED DIAGNOSES  Final diagnoses:  Acute respiratory failure with hypoxemia  Acute exacerbation of chronic obstructive pulmonary disease (COPD)      NEW MEDICATIONS STARTED DURING THIS VISIT:  New Prescriptions   No medications on file     Loleta Rose, MD 10/17/14 1125

## 2014-10-17 NOTE — Progress Notes (Signed)
Consult received. Chart Reviewed COPD GOLD Full consult to follow within 24 hours by [facility]  A\P - 59 yo with AECOPD 1. Duonebs q6hrs scheduled, q2hr prn 2. IV steroid x 24hrs then oral prednisone 3. Hold inhalers for now 4. pulmicort BID 5. Sputum culture.  6. Cont with Azithromycin 7. Tobacco cessation 8. Bipap QHS, PRN Bipap during the day for 2 hrs in the AM and 2 hrs in the PM.  9. Avoid benzos and narcotics if possible.   Stephanie Acre, MD [facility] Pager [phone] (Please enter 7-digits)

## 2014-10-17 NOTE — H&P (Signed)
[facility] [place] [facility]   PATIENT NAME: Devin Bryant    MR#:  [id number]  DATE OF BIRTH:  1955-05-19  DATE OF ADMISSION:  10/17/2014  PRIMARY CARE PHYSICIAN: No primary care provider on file.   REQUESTING/REFERRING PHYSICIAN: Loleta Rose, MD  CHIEF COMPLAINT:   Chief Complaint  Patient presents with  . Shortness of Breath   shortness of breath for several weeks.  HISTORY OF PRESENT ILLNESS:  Devin Bryant  is a 59 y.o. male with a known history of COPD, hypertension, lipidemia and tobacco abuse. The patient has had multiple episodes of COPD exacerbation with bronchitis in the past several weeks. He has been taking Levaquin and doxycycline intermittently falls past several weeks. But he still has shortness of breath which has been worsening for the past few days. His wife stated that she thought the patient was confused last night. His oxygen level was in the low 80s this morning. EMS put him on nonbreather which improved oxygen to the upper 80s. Since he has severe respiratory distress and wheezing, he was treated with IV Solu-Medrol 125 mg IV and 2 doses of DuoNeb. He is put on BiPAP in the ED. The patient denies any fever, chills, chest pain, palpitation or leg edema.  PAST MEDICAL HISTORY:   Past Medical History  Diagnosis Date  . Allergy   . Hypertension   . COPD (chronic obstructive pulmonary disease)   . GERD (gastroesophageal reflux disease)   . Adjustment disorder with depressed mood   . ED (erectile dysfunction)   . Tobacco use   . Hypertriglyceridemia   . Elevated glucose   . Elevated serum glutamic pyruvic transaminase (SGPT) level     PAST SURGICAL HISTORY:   Past Surgical History  Procedure Laterality Date  . Septoplasty  Feb 2016  . Knee surgery      arthroscopic    SOCIAL HISTORY:   Social History  Substance Use Topics  . Smoking status: Current Every Day Smoker -- 1.50 packs/day for 40 years    Types:  Cigarettes  . Smokeless tobacco: Never Used  . Alcohol Use: Yes    FAMILY HISTORY:   Family History  Problem Relation Age of Onset  . Diabetes Mother   . Cancer Father     lung    DRUG ALLERGIES:  No Known Allergies  REVIEW OF SYSTEMS:  CONSTITUTIONAL: No fever, fatigue or weakness.  EYES: No blurred or double vision.  EARS, NOSE, AND THROAT: No tinnitus or ear pain.  RESPIRATORY: Has cough, shortness of breath, wheezing but no hemoptysis.  CARDIOVASCULAR: No chest pain, orthopnea, edema.  GASTROINTESTINAL: No nausea, vomiting, diarrhea or abdominal pain.  GENITOURINARY: No dysuria, hematuria.  ENDOCRINE: No polyuria, nocturia,  HEMATOLOGY: No anemia, easy bruising or bleeding SKIN: No rash or lesion. MUSCULOSKELETAL: No joint pain or arthritis.   NEUROLOGIC: No tingling, numbness, weakness.  PSYCHIATRY: No anxiety or depression.   MEDICATIONS AT HOME:   Prior to Admission medications   Medication Sig Start Date End Date Taking? Authorizing Provider  ADVAIR HFA 115-21 MCG/ACT inhaler Inhale 2 puffs into the lungs 2 (two) times daily.  10/11/14  Yes Historical Provider, MD  albuterol (PROVENTIL HFA;VENTOLIN HFA) 108 (90 BASE) MCG/ACT inhaler Inhale 2 puffs into the lungs every 6 (six) hours as needed for wheezing or shortness of breath.    Yes Historical Provider, MD  albuterol (PROVENTIL) (2.5 MG/3ML) 0.083% nebulizer solution Take 3 mLs (2.5 mg total) by nebulization every 4 (  four) hours as needed for wheezing or shortness of breath. 10/15/14  Yes Kerman Passey, MD  aspirin EC 81 MG tablet Take 81 mg by mouth daily.   Yes Historical Provider, MD  Cetirizine HCl (ZYRTEC ALLERGY) 10 MG CAPS Take 10 mg by mouth daily.   Yes Historical Provider, MD  clonazePAM (KLONOPIN) 0.5 MG tablet Take 1 tablet (0.5 mg total) by mouth 2 (two) times daily as needed for anxiety. 10/15/14  Yes Kerman Passey, MD  doxycycline (VIBRA-TABS) 100 MG tablet Take 1 tablet (100 mg total) by mouth 2 (two)  times daily. 10/15/14  Yes Kerman Passey, MD  esomeprazole (NEXIUM) 20 MG capsule Take 20 mg by mouth daily at 12 noon.   Yes Historical Provider, MD  fluconazole (DIFLUCAN) 200 MG tablet Take 200 mg by mouth daily.   Yes Historical Provider, MD  fluticasone (FLONASE) 50 MCG/ACT nasal spray Place 2 sprays into both nostrils daily.  07/23/14  Yes Historical Provider, MD  mometasone (ELOCON) 0.1 % ointment Apply 1 application topically daily as needed (for ear.). Apply to each ear 1 drop daily as needed   Yes Historical Provider, MD  Multiple Vitamin (MULTIVITAMIN) tablet Take 1 tablet by mouth daily.   Yes Historical Provider, MD  PARoxetine (PAXIL) 20 MG tablet Take 20 mg by mouth daily.   Yes Historical Provider, MD  Umeclidinium Bromide (INCRUSE ELLIPTA) 62.5 MCG/INH AEPB Inhale 1 Inhaler into the lungs daily. 10/15/14  Yes Kerman Passey, MD      VITAL SIGNS:  Blood pressure 143/90, pulse 112, temperature 98.2 F (36.8 C), temperature source Oral, resp. rate 19, height 5\' 10"  (1.778 m), weight 77.111 kg (170 lb), SpO2 97 %.  PHYSICAL EXAMINATION:  GENERAL:  59 y.o.-year-old patient lying in the bed on BiPAP.  EYES: Pupils equal, round, reactive to light and accommodation. No scleral icterus. Extraocular muscles intact.  HEENT: Head atraumatic, normocephalic. Moist oral mucosa. NECK:  Supple, no jugular venous distention. No thyroid enlargement, no tenderness.  LUNGS: Bilateral expiratory wheezing, no rales,rhonchi or crepitation. No use of accessory muscles of respiration.  CARDIOVASCULAR: S1, S2 normal. No murmurs, rubs, or gallops.  ABDOMEN: Soft, nontender, nondistended. Bowel sounds present. No organomegaly or mass.  EXTREMITIES: No pedal edema, cyanosis, or clubbing.  NEUROLOGIC: Cranial nerves II through XII are intact. Muscle strength 5/5 in all extremities. Sensation intact. Gait not checked.  PSYCHIATRIC: The patient is alert and oriented x 3.  SKIN: No obvious rash, lesion, or  ulcer.   LABORATORY PANEL:   CBC  Recent Labs Lab 10/17/14 0925  WBC 9.4  HGB 15.2  HCT 45.1  PLT 255   ------------------------------------------------------------------------------------------------------------------  Chemistries   Recent Labs Lab 10/17/14 0925  NA 139  K 4.1  CL 100*  CO2 29  GLUCOSE 128*  BUN 6  CREATININE 0.79  CALCIUM 9.2  MG 1.5*  AST 56*  ALT 58  ALKPHOS 74  BILITOT 0.8   ------------------------------------------------------------------------------------------------------------------  Cardiac Enzymes  Recent Labs Lab 10/17/14 0925  TROPONINI <0.03   ------------------------------------------------------------------------------------------------------------------  RADIOLOGY:  Dg Chest Port 1 View  10/17/2014   CLINICAL DATA:  Shortness of breath.  EXAM: PORTABLE CHEST - 1 VIEW  COMPARISON:  None.  FINDINGS: The heart size and mediastinal contours are within normal limits. Both lungs are clear. No pneumothorax or pleural effusion is noted. The visualized skeletal structures are unremarkable.  IMPRESSION: No acute cardiopulmonary abnormality seen.   Electronically Signed   By: Lupita Raider,  M.D.   On: 10/17/2014 10:04    EKG:   Orders placed or performed during the hospital encounter of 10/17/14  . EKG test  . EKG test    IMPRESSION AND PLAN:    Acute respiratory failure with hypoxia COPD exacerbation Hypomagnesemia Hypertension Hyperlipidemia Tobacco abuse  The patient will be admitted to medical floor. Try to wean off BiPAP and changed to oxygen by nasal cannular. Continue nebulizer treatment, start IV Solu-Medrol and Zithromax. Since the patient had 3 admissions for COPD exacerbation in the past 6 months, I will start GOLD protocol and request pulmonary consult. I will give magnesium IV and follow-up magnesium level tomorrow morning. Smoking cessation was counseled for 4-5 minutes and I will give nicotine  patch.  All the records are reviewed and case discussed with ED provider. Management plans discussed with the patient, his wife and daughters and they are in agreement.  CODE STATUS: Full code  TOTAL CRITICAL TIME TAKING CARE OF THIS PATIENT: 58 minutes.    Shaune Pollack M.D on 10/17/2014 at 11:51 AM  Between 7am to 6pm - Pager - [phone]  After 6pm go to [email or web address] - password [email or web address] [facility]  [facility]  Office  [phone]  CC: Primary care physician; No primary care provider on file.

## 2014-10-17 NOTE — ED Notes (Signed)
Admitting MD at bedside.

## 2014-10-18 DIAGNOSIS — J441 Chronic obstructive pulmonary disease with (acute) exacerbation: Secondary | ICD-10-CM

## 2014-10-18 LAB — CBC
HEMATOCRIT: 40.9 % (ref 40.0–52.0)
Hemoglobin: 13.9 g/dL (ref 13.0–18.0)
MCH: 37 pg — ABNORMAL HIGH (ref 26.0–34.0)
MCHC: 34.1 g/dL (ref 32.0–36.0)
MCV: 108.7 fL — AB (ref 80.0–100.0)
Platelets: 235 10*3/uL (ref 150–440)
RBC: 3.76 MIL/uL — AB (ref 4.40–5.90)
RDW: 13.6 % (ref 11.5–14.5)
WBC: 13 10*3/uL — AB (ref 3.8–10.6)

## 2014-10-18 LAB — BASIC METABOLIC PANEL
Anion gap: 11 (ref 5–15)
BUN: 8 mg/dL (ref 6–20)
CHLORIDE: 102 mmol/L (ref 101–111)
CO2: 26 mmol/L (ref 22–32)
Calcium: 8.8 mg/dL — ABNORMAL LOW (ref 8.9–10.3)
Creatinine, Ser: 0.7 mg/dL (ref 0.61–1.24)
GFR calc non Af Amer: >60 mL/min (ref >60)
Glucose, Bld: 154 mg/dL — ABNORMAL HIGH (ref 65–99)
POTASSIUM: 4.4 mmol/L (ref 3.5–5.1)
SODIUM: 139 mmol/L (ref 135–145)

## 2014-10-18 LAB — MAGNESIUM: Magnesium: 2.1 mg/dL (ref 1.7–2.4)

## 2014-10-18 MED ORDER — NICOTINE POLACRILEX 2 MG MT GUM
2.0000 mg | CHEWING_GUM | OROMUCOSAL | Status: DC | PRN
  Filled 2014-10-18: qty 1

## 2014-10-18 MED ORDER — BUDESONIDE 0.5 MG/2ML IN SUSP
0.5000 mg | Freq: Two times a day (BID) | RESPIRATORY_TRACT | Status: DC
  Administered 2014-10-18 – 2014-10-19 (×2): 0.5 mg via RESPIRATORY_TRACT
  Filled 2014-10-18 (×2): qty 2

## 2014-10-18 MED ORDER — ALBUTEROL SULFATE (2.5 MG/3ML) 0.083% IN NEBU
2.5000 mg | INHALATION_SOLUTION | RESPIRATORY_TRACT | Status: DC
  Administered 2014-10-18 – 2014-10-19 (×5): 2.5 mg via RESPIRATORY_TRACT
  Filled 2014-10-18 (×5): qty 3

## 2014-10-18 MED ORDER — TIOTROPIUM BROMIDE MONOHYDRATE 18 MCG IN CAPS
18.0000 ug | ORAL_CAPSULE | Freq: Every day | RESPIRATORY_TRACT | Status: DC
  Administered 2014-10-18 – 2014-10-19 (×2): 18 ug via RESPIRATORY_TRACT
  Filled 2014-10-18: qty 5

## 2014-10-18 MED ORDER — MOMETASONE FURO-FORMOTEROL FUM 200-5 MCG/ACT IN AERO
2.0000 | INHALATION_SPRAY | Freq: Two times a day (BID) | RESPIRATORY_TRACT | Status: DC
  Administered 2014-10-18 – 2014-10-19 (×2): 2 via RESPIRATORY_TRACT
  Filled 2014-10-18: qty 8.8

## 2014-10-18 MED ORDER — CETYLPYRIDINIUM CHLORIDE 0.05 % MT LIQD
7.0000 mL | Freq: Two times a day (BID) | OROMUCOSAL | Status: DC
  Administered 2014-10-18 (×2): 7 mL via OROMUCOSAL

## 2014-10-18 NOTE — Care Management Note (Signed)
Case Management Note  Patient Details  Name: Devin Bryant MRN: [id number] Date of Birth: Apr 25, 1955  Subjective/Objective:                  RNCM consult received for COPD Gold. Patient is from home with his wife. He is independent with mobility, drives and goes to [facility] medical treatment. He states he is able to afford him Rx at [facility]. He is new to O2.   Action/Plan:  RNCM will continue to follow for home O2 needs. RN to assess need for home O2.   Expected Discharge Date:                  Expected Discharge Plan:     In-House Referral:     Discharge planning Services  CM Consult  Post Acute Care Choice:    Choice offered to:  Patient  DME Arranged:    DME Agency:     HH Arranged:    HH Agency:     Status of Service:  Completed, signed off  Medicare Important Message Given:    Date Medicare IM Given:    Medicare IM give by:    Date Additional Medicare IM Given:    Additional Medicare Important Message give by:     If discussed at Long Length of Stay Meetings, dates discussed:    Additional Comments:  Collie Siad, RN 10/18/2014, 11:20 AM

## 2014-10-18 NOTE — Progress Notes (Signed)
[facility] -  at [facility]   PATIENT NAME: Devin Bryant    MR#:  [id number]  DATE OF BIRTH:  28-Sep-1955  SUBJECTIVE:  CHIEF COMPLAINT:   Chief Complaint  Patient presents with  . Shortness of Breath   SOB better. Cough. Wheezing On 3 L O2. REVIEW OF SYSTEMS:    Review of Systems  Constitutional: Positive for malaise/fatigue. Negative for fever and chills.  HENT: Negative for sore throat.   Eyes: Negative for blurred vision, double vision and pain.  Respiratory: Positive for cough, sputum production, shortness of breath and wheezing. Negative for hemoptysis.   Cardiovascular: Negative for chest pain, palpitations, orthopnea and leg swelling.  Gastrointestinal: Negative for heartburn, nausea, vomiting, abdominal pain, diarrhea and constipation.  Genitourinary: Negative for dysuria and hematuria.  Musculoskeletal: Negative for back pain and joint pain.  Skin: Negative for rash.  Neurological: Positive for weakness and headaches. Negative for sensory change, speech change and focal weakness.  Endo/Heme/Allergies: Does not bruise/bleed easily.  Psychiatric/Behavioral: Negative for depression. The patient is not nervous/anxious.       DRUG ALLERGIES:  No Known Allergies  VITALS:  Blood pressure 142/86, pulse 88, temperature 97.5 F (36.4 C), temperature source Oral, resp. rate 18, height  (1.778 m), weight 77.111 kg (170 lb), SpO2 97 %.  PHYSICAL EXAMINATION:   Physical Exam  GENERAL:  59 y.o.-year-old patient lying in the bed with no acute distress.  EYES: Pupils equal, round, reactive to light and accommodation. No scleral icterus. Extraocular muscles intact.  HEENT: Head atraumatic, normocephalic. Oropharynx and nasopharynx clear.  NECK:  Supple, no jugular venous distention. No thyroid enlargement, no tenderness.  LUNGS: Bilateral wheezing CARDIOVASCULAR: S1, S2 normal. No murmurs, rubs, or gallops.  ABDOMEN: Soft,  nontender, nondistended. Bowel sounds present. No organomegaly or mass.  EXTREMITIES: No cyanosis, clubbing or edema b/l.    NEUROLOGIC: Cranial nerves II through XII are intact. No focal Motor or sensory deficits b/l.   PSYCHIATRIC: The patient is alert and oriented x 3.  SKIN: No obvious rash, lesion, or ulcer.    LABORATORY PANEL:   CBC  Recent Labs Lab 10/18/14 0427  WBC 13.0*  HGB 13.9  HCT 40.9  PLT 235   ------------------------------------------------------------------------------------------------------------------  Chemistries   Recent Labs Lab 10/17/14 0925 10/18/14 0427  NA 139 139  K 4.1 4.4  CL 100* 102  CO2 29 26  GLUCOSE 128* 154*  BUN 6 8  CREATININE 0.79 0.70  CALCIUM 9.2 8.8*  MG 1.5* 2.1  AST 56*  --   ALT 58  --   ALKPHOS 74  --   BILITOT 0.8  --    ------------------------------------------------------------------------------------------------------------------  Cardiac Enzymes  Recent Labs Lab 10/17/14 0925  TROPONINI <0.03   ------------------------------------------------------------------------------------------------------------------  RADIOLOGY:  Dg Chest Port 1 View  10/17/2014   CLINICAL DATA:  Shortness of breath.  EXAM: PORTABLE CHEST - 1 VIEW  COMPARISON:  None.  FINDINGS: The heart size and mediastinal contours are within normal limits. Both lungs are clear. No pneumothorax or pleural effusion is noted. The visualized skeletal structures are unremarkable.  IMPRESSION: No acute cardiopulmonary abnormality seen.   Electronically Signed   By: Lupita Raider, M.D.   On: 10/17/2014 10:04     ASSESSMENT AND PLAN:   * COPD exacerbation -IV steroids, Antibiotics - Scheduled Nebulizers - Inhalers -Wean O2 as tolerated - Consult pulmonary if no improvement  * Acute resp failure due to above Wean O2 as tolerated. May  need home O2  * HTN Continue home medications  * Tobacco abuse  All the records are reviewed and case  discussed with Care Management/Social Workerr. Management plans discussed with the patient, family and they are in agreement.  CODE STATUS: FULL  DVT Prophylaxis: SCDs  TOTAL TIME TAKING CARE OF THIS PATIENT: 35 minutes.   POSSIBLE D/C IN 1-2 DAYS, DEPENDING ON CLINICAL CONDITION.   Milagros Loll R M.D on 10/18/2014 at 11:35 AM  Between 7am to 6pm - Pager - [phone]  After 6pm go to [email or web address] - password [email or web address] [facility]  [facility]  Office  [phone]  CC: Primary care physician; No primary care provider on file.

## 2014-10-18 NOTE — Evaluation (Signed)
Physical Therapy Evaluation Patient Details Name: Savvas Roper MRN: [id number] DOB: 1956-02-02 Today's Date: 10/18/2014   History of Present Illness  Kamare Caspers is a 59 y.o. male with a known history of COPD, hypertension, lipidemia and tobacco abuse. The patient has had multiple episodes of COPD exacerbation with bronchitis in the past several weeks. He has been taking Levaquin and doxycycline intermittently falls past several weeks. But he still has shortness of breath which has been worsening for the past few days. His wife stated that she thought the patient was confused last night. His oxygen level was in the low 80s this morning. EMS put him on nonbreather which improved oxygen to the upper 80s. Since he has severe respiratory distress and wheezing, he was treated with IV Solu-Medrol 125 mg IV and 2 doses of DuoNeb. He is put on BiPAP in the ED. The patient denies any fever, chills, chest pain, palpitation or leg edema. At baseline pt is fully indpendent with ADL/IADLs. Pt drives and works in [place] during the week. No falls over the last 12 months.   Clinical Impression  Pt is primarily limited by DOE with SaO2 dropping to 86% on 2L/min during ambulation. Poor cardiopulmonary endurance noted but otherwise strength and stability are excellent. Pt may benefit from pulmonary rehab at discharge to improve endurance and learn adaptive strategies to avoid future exacerbations/admissions. Pt will benefit from skilled PT services to address deficits in strength, balance, and mobility in order to return to full function at home.     Follow Up Recommendations Other (comment) (Pulmonary rehab)    Equipment Recommendations  None recommended by PT    Recommendations for Other Services       Precautions / Restrictions Precautions Precautions: None Restrictions Weight Bearing Restrictions: No      Mobility  Bed Mobility Overal bed mobility: Independent                 Transfers Overall transfer level: Independent Equipment used: None                Ambulation/Gait Ambulation/Gait assistance: Independent Ambulation Distance (Feet): 50 Feet Assistive device: None Gait Pattern/deviations: WFL(Within Functional Limits)   Gait velocity interpretation: >2.62 ft/sec, indicative of independent community ambulator General Gait Details: Good speed, cadence,and stability. Modified DGI 12/12. No evidence for imbalance. Pt becomes short of breath during exertion on 2L/min O2. SaO2 drops to 86% and HR increases to 113 from 100 at rest. Pt takes 30-45 seconds of pursed lip breathing for SaO2 to recover to >90%.  Stairs            Wheelchair Mobility    Modified Rankin (Stroke Patients Only)       Balance Overall balance assessment: Independent                                           Pertinent Vitals/Pain Pain Assessment: No/denies pain    Home Living Family/patient expects to be discharged to:: Private residence Living Arrangements: Spouse/significant other Available Help at Discharge: Family Type of Home: House Home Access: Stairs to enter Entrance Stairs-Rails: Doctor, general practice of Steps: 3 Home Layout: Two level Home Equipment: Grab bars - tub/shower (no assistive device, no BSC)      Prior Function Level of Independence: Independent               Hand Dominance  Extremity/Trunk Assessment   Upper Extremity Assessment: Overall WFL for tasks assessed           Lower Extremity Assessment: Overall WFL for tasks assessed         Communication   Communication: No difficulties  Cognition Arousal/Alertness: Awake/alert Behavior During Therapy: WFL for tasks assessed/performed Overall Cognitive Status: Within Functional Limits for tasks assessed                      General Comments      Exercises        Assessment/Plan    PT Assessment Patient  needs continued PT services  PT Diagnosis Difficulty walking   PT Problem List Cardiopulmonary status limiting activity  PT Treatment Interventions Other (comment);Therapeutic exercise (Cardiopulmonary endurance)   PT Goals (Current goals can be found in the Care Plan section) Acute Rehab PT Goals Patient Stated Goal: "I would like to get home" PT Goal Formulation: With patient/family Time For Goal Achievement: 11/01/14 Potential to Achieve Goals: Good    Frequency Min 2X/week   Barriers to discharge   20 steps from first to second level of home. Bedroom and bathroom on second level    Co-evaluation               End of Session Equipment Utilized During Treatment: Gait belt Activity Tolerance: Patient limited by fatigue (DOE) Patient left: in bed;with family/visitor present           Time: 1100-1115 PT Time Calculation (min) (ACUTE ONLY): 15 min   Charges:   PT Evaluation $Initial PT Evaluation Tier I: 1 Procedure     PT G Codes:       Sharalyn Ink Ewart Carrera PT, DPT   Elyssa Pendelton 10/18/2014, 11:35 AM

## 2014-10-18 NOTE — Progress Notes (Signed)
Initial Nutrition Assessment    INTERVENTION:  Meals and snacks: Cater to pt preferences    NUTRITION DIAGNOSIS:   Inadequate oral intake related to acute illness as evidenced by per patient/family report.    GOAL:   Patient will meet greater than or equal to 90% of their needs    MONITOR:    (Energy intake, Pulmonary profile)  REASON FOR ASSESSMENT:   Consult Assessment of nutrition requirement/status  ASSESSMENT:      Pt admitted with COPD excerbation.  Past Medical History  Diagnosis Date  . Allergy   . Hypertension   . COPD (chronic obstructive pulmonary disease)   . GERD (gastroesophageal reflux disease)   . Adjustment disorder with depressed mood   . ED (erectile dysfunction)   . Tobacco use   . Hypertriglyceridemia   . Elevated glucose   . Elevated serum glutamic pyruvic transaminase (SGPT) level   . Anxiety     Current Nutrition: eating 100% of meals per pt and I and O sheet  Food/Nutrition-Related History: pt reports decreased intake for some time now secondary to respiratory status    Medications: solumedrol  Electrolyte/Renal Profile and Glucose Profile:   Recent Labs Lab 10/17/14 0925 10/18/14 0427  NA 139 139  K 4.1 4.4  CL 100* 102  CO2 29 26  BUN 6 8  CREATININE 0.79 0.70  CALCIUM 9.2 8.8*  MG 1.5* 2.1  GLUCOSE 128* 154*   Protein Profile:  Recent Labs Lab 10/17/14 0925  ALBUMIN 4.2    Gastrointestinal Profile: Last BM: 8/14   Weight Change:3% in the last 5 months    Diet Order:  Diet Heart Room service appropriate?: Yes; Fluid consistency:: Thin  Skin:   reviewed   Height:   Ht Readings from Last 1 Encounters:  10/17/14  (1.778 m)    Weight:   Wt Readings from Last 1 Encounters:  10/17/14 170 lb (77.111 kg)     BMI:  Body mass index is 24.39 kg/(m^2).   EDUCATION NEEDS:   No education needs identified at this time  LOW Care Level  Karishma Unrein B. Freida Busman, RD, LDN [phone] (pager)

## 2014-10-18 NOTE — Evaluation (Signed)
Occupational Therapy Evaluation Patient Details Name: Devin Bryant MRN: [id number] DOB: March 24, 1955 Today's Date: 10/18/2014    History of Present Illness Devin Bryant is a 59 y.o. male with a known history of COPD, hypertension, lipidemia and tobacco abuse. The patient has had multiple episodes of COPD exacerbation with bronchitis in the past several weeks. He has been taking Levaquin and doxycycline intermittently falls past several weeks. But he still has shortness of breath which has been worsening for the past few days. His wife stated that she thought the patient was confused last night. His oxygen level was in the low 80s this morning. EMS put him on nonbreather which improved oxygen to the upper 80s. Since he has severe respiratory distress and wheezing, he was treated with IV Solu-Medrol 125 mg IV and 2 doses of DuoNeb. He is put on BiPAP in the ED. The patient denies any fever, chills, chest pain, palpitation or leg edema. At baseline pt is fully indpendent with ADL/IADLs. Pt drives and works in [place] during the week. No falls over the last 12 months.    Clinical Impression   Patient seen to assess ADL status and provide education in modifications and management of COPD.  Patient was able to move from lying to sitting and then to standing with supervision only.  He had a coughing episode that cause his whole body to tremor while sitting and few minutes to recover.  He presents with a mild resting tremor but is able to function during ADLs despite this.  Rec he consider purchasing a shower chair to have available to sit down if coughing episode comes on to prevent a fall.  Discussed purse lip breathing to manage COPD and how to do task analysis during the day since he is actively working and driving to [place] several times a week.  Patient has good strength in BUEs.  Evaluation and education to manage COPD completed and no further OT needed.    Follow Up Recommendations  No OT follow up    Equipment Recommendations   (rec purchasing a shower chair in case he has strong coughing attack while showering to prevent fall)    Recommendations for Other Services       Precautions / Restrictions Precautions Precautions: None Restrictions Weight Bearing Restrictions: No      Mobility Bed Mobility Overal bed mobility: Independent                Transfers Overall transfer level: Independent Equipment used: None                  Balance Overall balance assessment: Independent                                          ADL Overall ADL's : Modified independent                                       General ADL Comments: Pt able to sit and stand with supervision and reach feet for LB dressing without any difficulty and appears to be at baseline and is slighlty weaker over for ADLs.     Vision     Perception     Praxis      Pertinent Vitals/Pain Pain Assessment: No/denies pain     Hand  Dominance Right   Extremity/Trunk Assessment Upper Extremity Assessment Upper Extremity Assessment: Overall WFL for tasks assessed   Lower Extremity Assessment Lower Extremity Assessment: Overall WFL for tasks assessed       Communication Communication Communication: No difficulties   Cognition Arousal/Alertness: Awake/alert Behavior During Therapy: WFL for tasks assessed/performed Overall Cognitive Status: Within Functional Limits for tasks assessed                     General Comments       Exercises       Shoulder Instructions      Home Living Family/patient expects to be discharged to:: Private residence Living Arrangements: Spouse/significant other Available Help at Discharge: Family Type of Home: House Home Access: Stairs to enter Secretary/administrator of Steps: 3 Entrance Stairs-Rails: Right;Left Home Layout: Two level Alternate Level Stairs-Number of Steps: 20 Alternate Level  Stairs-Rails: Left Bathroom Shower/Tub: Producer, television/film/video: Standard     Home Equipment: Grab bars - tub/shower          Prior Functioning/Environment Level of Independence: Independent             OT Diagnosis: Generalized weakness   OT Problem List:     OT Treatment/Interventions:      OT Goals(Current goals can be found in the care plan section) Acute Rehab OT Goals Patient Stated Goal: "I would like to get home"  OT Frequency:     Barriers to D/C:            Co-evaluation              End of Session    Activity Tolerance: Patient tolerated treatment well Patient left: in bed;with call bell/phone within reach;with family/visitor present   Time: 1030-1058 OT Time Calculation (min): 28 min Charges:  OT General Charges $OT Visit: 1 Procedure OT Evaluation $Initial OT Evaluation Tier I: 1 Procedure OT Treatments $Self Care/Home Management : 8-22 mins G-Codes:    Wofford,Susan 11/11/2014, 1:54 PM    Susanne Borders, OTR/L

## 2014-10-18 NOTE — Consult Note (Signed)
Date: 10/18/2014,   MRN# [id number] Devin Bryant 12-Oct-1955 Code Status:     Code Status Orders        Start     Ordered   10/17/14 1431  Full code   Continuous     10/17/14 1431     Hosp day:@LENGTHOFSTAYDAYS @ Referring MD: @     PCP:      AdmissionWeight: 170 lb (77.111 kg)                 CurrentWeight: 170 lb (77.111 kg) Devin Bryant is a 59 y.o. old male seen in consultation for acute COPD exacerbation.    CHIEF COMPLAINT:   SOB and wheezing with cough   HISTORY OF PRESENT ILLNESS   59 yo white male seen today fro increased WOB and increased SOb with cough adn wheezing for the last several days, patient could NOT walk very far and had generalized weakness and dyspnea  Patient is a long tome smoker 40 pack year abuse, still smokes, patient states that he had been diagnosed with COPD several years ago but does not see a pulmonologist.  Patient states that he feels a little better since admission, patient had been on advair for a long time, recently diagnosed with oral thrush by ENT for hoarseness   PAST MEDICAL HISTORY   Past Medical History  Diagnosis Date  . Allergy   . Hypertension   . COPD (chronic obstructive pulmonary disease)   . GERD (gastroesophageal reflux disease)   . Adjustment disorder with depressed mood   . ED (erectile dysfunction)   . Tobacco use   . Hypertriglyceridemia   . Elevated glucose   . Elevated serum glutamic pyruvic transaminase (SGPT) level   . Anxiety      SURGICAL HISTORY   Past Surgical History  Procedure Laterality Date  . Septoplasty  Feb 2016  . Knee surgery      arthroscopic     FAMILY HISTORY   Family History  Problem Relation Age of Onset  . Diabetes Mother   . Cancer Father     lung     SOCIAL HISTORY   Social History  Substance Use Topics  . Smoking status: Current Every Day Smoker -- 1.50 packs/day for 40 years    Types: Cigarettes  . Smokeless tobacco: Never Used  . Alcohol Use:  Yes     Comment: drinks daily 2-3 cups of wine, beer, liquor     MEDICATIONS    Home Medication:  No current outpatient prescriptions on file.  Current Medication:  Current facility-administered medications:  .  0.9 %  sodium chloride infusion, 250 mL, Intravenous, PRN, Shaune Pollack, MD .  acetaminophen (TYLENOL) tablet 650 mg, 650 mg, Oral, Q6H PRN **OR** acetaminophen (TYLENOL) suppository 650 mg, 650 mg, Rectal, Q6H PRN, Shaune Pollack, MD .  antiseptic oral rinse (CPC / CETYLPYRIDINIUM CHLORIDE 0.05%) solution 7 mL, 7 mL, Mouth Rinse, BID, Charleen Kirks, RN, 7 mL at 10/18/14 1010 .  aspirin EC tablet 81 mg, 81 mg, Oral, Daily, Shaune Pollack, MD, 81 mg at 10/18/14 1008 .  azithromycin [facility]) tablet 500 mg, 500 mg, Oral, Daily, Shaune Pollack, MD, 500 mg at 10/18/14 1008 .  benzonatate (TESSALON) capsule 100 mg, 100 mg, Oral, TID PRN, Shaune Pollack, MD, 100 mg at 10/17/14 1516 .  budesonide (PULMICORT) nebulizer solution 0.25 mg, 0.25 mg, Nebulization, BID, Vishal Mungal, MD, 0.25 mg at 10/18/14 0834 .  clonazePAM (KLONOPIN) tablet 0.5 mg, 0.5 mg, Oral, BID PRN,  Shaune Pollack, MD, 0.5 mg at 10/18/14 1256 .  fluconazole (DIFLUCAN) tablet 200 mg, 200 mg, Oral, Daily, Shaune Pollack, MD, 200 mg at 10/18/14 1008 .  fluticasone (FLONASE) 50 MCG/ACT nasal spray 2 spray, 2 spray, Each Nare, Daily, Shaune Pollack, MD, 2 spray at 10/18/14 1009 .  guaiFENesin-codeine 100-10 MG/5ML solution 10 mL, 10 mL, Oral, Q4H PRN, Wyatt Haste, MD, 10 mL at 10/18/14 1019 .  heparin injection 5,000 Units, 5,000 Units, Subcutaneous, 3 times per day, Shaune Pollack, MD, 5,000 Units at 10/18/14 1256 .  ipratropium-albuterol (DUONEB) 0.5-2.5 (3) MG/3ML nebulizer solution 3 mL, 3 mL, Nebulization, Q6H, Vishal Mungal, MD, 3 mL at 10/18/14 0833 .  ipratropium-albuterol (DUONEB) 0.5-2.5 (3) MG/3ML nebulizer solution 3 mL, 3 mL, Nebulization, Q2H PRN, Vishal Mungal, MD .  loratadine (CLARITIN) tablet 10 mg, 10 mg, Oral, Daily, Shaune Pollack, MD, 10 mg at  10/18/14 1008 .  mometasone (ELOCON) 0.1 % cream, , Topical, Daily PRN, Shaune Pollack, MD .  nicotine (NICODERM CQ - dosed in mg/24 hours) patch 14 mg, 14 mg, Transdermal, Daily, Shaune Pollack, MD, 14 mg at 10/18/14 1010 .  nicotine polacrilex (NICORETTE) gum 2 mg, 2 mg, Oral, PRN, Erin Fulling, MD .  ondansetron (ZOFRAN) tablet 4 mg, 4 mg, Oral, Q6H PRN **OR** ondansetron (ZOFRAN) injection 4 mg, 4 mg, Intravenous, Q6H PRN, Shaune Pollack, MD .  pantoprazole (PROTONIX) EC tablet 40 mg, 40 mg, Oral, Daily, Shaune Pollack, MD, 40 mg at 10/18/14 1009 .  PARoxetine (PAXIL) tablet 20 mg, 20 mg, Oral, Daily, Shaune Pollack, MD, 20 mg at 10/18/14 1008 .  [START ON 10/19/2014] predniSONE (DELTASONE) tablet 40 mg, 40 mg, Oral, Q breakfast, Vishal Mungal, MD .  sodium chloride 0.9 % injection 3 mL, 3 mL, Intravenous, Q12H, Shaune Pollack, MD, 3 mL at 10/18/14 1009 .  sodium chloride 0.9 % injection 3 mL, 3 mL, Intravenous, PRN, Shaune Pollack, MD    ALLERGIES   Review of patient's allergies indicates no known allergies.     REVIEW OF SYSTEMS   Review of Systems  Constitutional: Positive for malaise/fatigue and diaphoresis. Negative for fever, chills and weight loss.  HENT: Positive for congestion and sore throat. Negative for hearing loss.   Eyes: Negative for blurred vision, double vision and photophobia.  Respiratory: Positive for cough, shortness of breath and wheezing. Negative for hemoptysis and sputum production.   Cardiovascular: Negative for chest pain, palpitations, orthopnea and leg swelling.  Gastrointestinal: Negative for heartburn, nausea, vomiting and abdominal pain.  Genitourinary: Negative for dysuria.  Musculoskeletal: Negative for back pain and neck pain.  Skin: Negative for itching and rash.  Neurological: Negative for dizziness, tingling and headaches.  Endo/Heme/Allergies: Negative for environmental allergies.  Psychiatric/Behavioral: The patient is nervous/anxious.      VS: BP 127/77 mmHg  Pulse 101   Temp(Src) 97.6 F (36.4 C) (Oral)  Resp 18  Ht 5\' 10"  (1.778 m)  Wt 170 lb (77.111 kg)  BMI 24.39 kg/m2  SpO2 93%     PHYSICAL EXAM  Physical Exam  Constitutional: He is oriented to person, place, and time. He appears well-developed and well-nourished. He appears distressed.  HENT:  Head: Normocephalic and atraumatic.  Mouth/Throat: Oropharynx is clear and moist.  Eyes: Conjunctivae and EOM are normal. Pupils are equal, round, and reactive to light. No scleral icterus.  Neck: Normal range of motion. Neck supple.  Cardiovascular: Normal rate, regular rhythm and normal heart sounds.   No murmur heard. Pulmonary/Chest: He has wheezes. He  has rales.  Abdominal: Soft. Bowel sounds are normal.  Musculoskeletal: Normal range of motion. He exhibits no edema.  Neurological: He is alert and oriented to person, place, and time.  Skin: Skin is warm. He is diaphoretic.  Psychiatric: He has a normal mood and affect.        LABS    Recent Labs     10/17/14  0925  10/18/14  0427  HGB  15.2  13.9  HCT  45.1  40.9  MCV  108.5*  108.7*  WBC  9.4  13.0*  BUN  6  8  CREATININE  0.79  0.70  GLUCOSE  128*  154*  CALCIUM  9.2  8.8*  ,    No results for input(s): PH in the last 72 hours.  Invalid input(s): PCO2, PO2, BASEEXCESS, BASEDEFICITE, TFT    CULTURE RESULTS   No results found for this or any previous visit (from the past 240 hour(s)).        IMAGING    No results found.       ASSESSMENT/PLAN   59 yo white male admitted for acute COPD exacerbation likely from acute bronchitis with active smoking  1.oxygen as needed 2.start spiriva 3.start dulera 4.albuterol nebs every 4 hrs and Pulmicort nebs bid 5.continue prednisone as prescribed 6.smoking cessation counseled, ordered nicotine gum    I have personally obtained a history, examined the patient, evaluated laboratory and independently reviewed imaging results, formulated the assessment and plan and  placed orders.  The Patient requires high complexity decision making for assessment and support, frequent evaluation and titration of therapies, application of advanced monitoring technologies and extensive interpretation of multiple databases. Time spent with patient 45 minutes.  Patient is satisfied with Plan of action and management.    Lucie Leather, M.D.  [facility]  Medical Director [facility] Medical Director [facility] Cardio-Pulmonary Department

## 2014-10-19 ENCOUNTER — Encounter: Payer: Self-pay | Admitting: Internal Medicine

## 2014-10-19 DIAGNOSIS — J441 Chronic obstructive pulmonary disease with (acute) exacerbation: Secondary | ICD-10-CM | POA: Diagnosis present

## 2014-10-19 DIAGNOSIS — Z720 Tobacco use: Secondary | ICD-10-CM | POA: Diagnosis present

## 2014-10-19 MED ORDER — PREDNISONE 20 MG PO TABS: 40.0000 mg | ORAL_TABLET | Freq: Every day | ORAL | Status: DC

## 2014-10-19 MED ORDER — TIOTROPIUM BROMIDE MONOHYDRATE 18 MCG IN CAPS: 18.0000 ug | ORAL_CAPSULE | Freq: Every day | RESPIRATORY_TRACT | Status: DC

## 2014-10-19 MED ORDER — AZITHROMYCIN 250 MG PO TABS: 250.0000 mg | ORAL_TABLET | Freq: Every day | ORAL | Status: DC

## 2014-10-19 NOTE — Discharge Instructions (Signed)
°  DIET:  °Cardiac diet ° °DISCHARGE CONDITION:  °Stable ° °ACTIVITY:  °Activity as tolerated ° °OXYGEN:  °Home Oxygen: No. °  °Oxygen Delivery: room air ° °DISCHARGE LOCATION:  °home  ° °If you experience worsening of your admission symptoms, develop shortness of breath, life threatening emergency, suicidal or homicidal thoughts you must seek medical attention immediately by calling 911 or calling your MD immediately  if symptoms less severe. ° °You Must read complete instructions/literature along with all the possible adverse reactions/side effects for all the Medicines you take and that have been prescribed to you. Take any new Medicines after you have completely understood and accpet all the possible adverse reactions/side effects.  ° °Please note ° °You were cared for by a hospitalist during your hospital stay. If you have any questions about your discharge medications or the care you received while you were in the hospital after you are discharged, you can call the unit and asked to speak with the hospitalist on call if the hospitalist that took care of you is not available. Once you are discharged, your primary care physician will handle any further medical issues. Please note that NO REFILLS for any discharge medications will be authorized once you are discharged, as it is imperative that you return to your primary care physician (or establish a relationship with a primary care physician if you do not have one) for your aftercare needs so that they can reassess your need for medications and monitor your lab values. ° °QUIT SMOKING °

## 2014-10-19 NOTE — Progress Notes (Signed)
Clinical Child psychotherapist (CSW) received COPD consult. CSW attempted to meet with patient to preform anxiety and depression screen however patient was not in the room. Per RN patient was discharged and already left.   Jetta Lout, LCSWA [phone]

## 2014-10-19 NOTE — Progress Notes (Signed)
O2 sat 90 on rm air. Placed back on 1 L

## 2014-10-27 NOTE — Discharge Summary (Signed)
[facility] [place] at [facility]   PATIENT NAME: Devin Bryant    MR#:  [id number]  DATE OF BIRTH:  06/25/1955  DATE OF ADMISSION:  10/17/2014 ADMITTING PHYSICIAN: Shaune Pollack, MD  DATE OF DISCHARGE: 10/19/2014 11:33 AM  PRIMARY CARE PHYSICIAN: No primary care provider on file.    ADMISSION DIAGNOSIS:  Acute exacerbation of chronic obstructive pulmonary disease (COPD) [J44.1] Acute respiratory failure with hypoxemia [J96.01]  DISCHARGE DIAGNOSIS:  Active Problems:   Acute respiratory failure   COPD exacerbation   Tobacco abuse   SECONDARY DIAGNOSIS:   Past Medical History  Diagnosis Date  . Allergy   . Hypertension   . COPD (chronic obstructive pulmonary disease)   . GERD (gastroesophageal reflux disease)   . Adjustment disorder with depressed mood   . ED (erectile dysfunction)   . Tobacco use   . Hypertriglyceridemia   . Elevated glucose   . Elevated serum glutamic pyruvic transaminase (SGPT) level   . Anxiety      ADMITTING HISTORY  Devin Bryant is a 59 y.o. male with a known history of COPD, hypertension, lipidemia and tobacco abuse. The patient has had multiple episodes of COPD exacerbation with bronchitis in the past several weeks. He has been taking Levaquin and doxycycline intermittently falls past several weeks. But he still has shortness of breath which has been worsening for the past few days. His wife stated that she thought the patient was confused last night. His oxygen level was in the low 80s this morning. EMS put him on nonbreather which improved oxygen to the upper 80s. Since he has severe respiratory distress and wheezing, he was treated with IV Solu-Medrol 125 mg IV and 2 doses of DuoNeb. He is put on BiPAP in the ED. The patient denies any fever, chills, chest pain, palpitation or leg edema.   HOSPITAL COURSE:   * COPD exacerbation -IV steroids, Antibiotics - Scheduled Nebulizers - Inhalers Patient improved well. By  the day of discharge he is back to baseline without any wheezing or shortness of breath. Continued on oral antibiotics, steroids and inhaled nebulizer at home.  * Acute resp failure due to aboveWeaned off oxygen by day of discharge. Resolved.  * HTN Continue home medications  * Tobacco abuse Counseled to quit smoking during the hospital stay.   Stable for discharge home and follow-up with his PCP.   CONSULTS OBTAINED:  Treatment Team:  Stephanie Acre, MD  DRUG ALLERGIES:  No Known Allergies  DISCHARGE MEDICATIONS:   Discharge Medication List as of 10/19/2014 11:02 AM    START taking these medications   Details  azithromycin (ZITHROMAX) 250 MG tablet Take 1 tablet (250 mg total) by mouth daily., Starting 10/19/2014, Until Discontinued, Normal    predniSONE (DELTASONE) 20 MG tablet Take 2 tablets (40 mg total) by mouth daily with breakfast., Starting 10/19/2014, Until Discontinued, Normal    tiotropium (SPIRIVA HANDIHALER) 18 MCG inhalation capsule Place 1 capsule (18 mcg total) into inhaler and inhale daily., Starting 10/19/2014, Until Discontinued, Normal      CONTINUE these medications which have NOT CHANGED   Details  ADVAIR HFA 115-21 MCG/ACT inhaler Inhale 2 puffs into the lungs 2 (two) times daily. , Starting 10/11/2014, Until Discontinued, Historical Med    albuterol (PROVENTIL HFA;VENTOLIN HFA) 108 (90 BASE) MCG/ACT inhaler Inhale 2 puffs into the lungs every 6 (six) hours as needed for wheezing or shortness of breath. , Until Discontinued, Historical Med    albuterol (PROVENTIL) (2.5 MG/3ML)  0.083% nebulizer solution Take 3 mLs (2.5 mg total) by nebulization every 4 (four) hours as needed for wheezing or shortness of breath., Starting 10/15/2014, Until Discontinued, Normal    aspirin EC 81 MG tablet Take 81 mg by mouth daily., Until Discontinued, Historical Med    Cetirizine HCl (ZYRTEC ALLERGY) 10 MG CAPS Take 10 mg by mouth daily., Until Discontinued, Historical Med     clonazePAM (KLONOPIN) 0.5 MG tablet Take 1 tablet (0.5 mg total) by mouth 2 (two) times daily as needed for anxiety., Starting 10/15/2014, Until Discontinued, Print    esomeprazole (NEXIUM) 20 MG capsule Take 20 mg by mouth daily at 12 noon., Until Discontinued, Historical Med    fluconazole (DIFLUCAN) 200 MG tablet Take 200 mg by mouth daily., Until Discontinued, Historical Med    fluticasone (FLONASE) 50 MCG/ACT nasal spray Place 2 sprays into both nostrils daily. , Starting 07/23/2014, Until Discontinued, Historical Med    mometasone (ELOCON) 0.1 % ointment Apply 1 application topically daily as needed (for ear.). Apply to each ear 1 drop daily as needed, Until Discontinued, Historical Med    Multiple Vitamin (MULTIVITAMIN) tablet Take 1 tablet by mouth daily., Until Discontinued, Historical Med    PARoxetine (PAXIL) 20 MG tablet Take 20 mg by mouth daily., Until Discontinued, Historical Med    Umeclidinium Bromide (INCRUSE ELLIPTA) 62.5 MCG/INH AEPB Inhale 1 Inhaler into the lungs daily., Starting 10/15/2014, Until Discontinued, Sample      STOP taking these medications     doxycycline (VIBRA-TABS) 100 MG tablet          Today    VITAL SIGNS:  Blood pressure 123/72, pulse 96, temperature 97.7 F (36.5 C), temperature source Oral, resp. rate 20, height  (1.778 m), weight 77.111 kg (170 lb), SpO2 97 %.  I/O:  No intake or output data in the 24 hours ending 10/27/14 1240  PHYSICAL EXAMINATION:  Physical Exam  GENERAL:  59 y.o.-year-old patient lying in the bed with no acute distress.  LUNGS: Normal breath sounds bilaterally, no wheezing, rales,rhonchi or crepitation. No use of accessory muscles of respiration.  CARDIOVASCULAR: S1, S2 normal. No murmurs, rubs, or gallops.  ABDOMEN: Soft, non-tender, non-distended. Bowel sounds present. No organomegaly or mass.  NEUROLOGIC: Moves all 4 extremities. PSYCHIATRIC: The patient is alert and oriented x 3.  SKIN: No obvious  rash, lesion, or ulcer.   DATA REVIEW:   CBC No results for input(s): WBC, HGB, HCT, PLT in the last 168 hours.  Chemistries  No results for input(s): NA, K, CL, CO2, GLUCOSE, BUN, CREATININE, CALCIUM, MG, AST, ALT, ALKPHOS, BILITOT in the last 168 hours.  Invalid input(s): GFRCGP  Cardiac Enzymes No results for input(s): TROPONINI in the last 168 hours.  Microbiology Results  No results found for this or any previous visit.  RADIOLOGY:  No results found.    Follow up with PCP in 1 week.  Management plans discussed with the patient, family and they are in agreement.  CODE STATUS:   TOTAL TIME TAKING CARE OF THIS PATIENT ON DAY OF DISCHARGE: more than 30 minutes.    Milagros Loll R M.D on 10/27/2014 at 12:40 PM  Between 7am to 6pm - Pager - [phone]  After 6pm go to [email or web address] - password [email or web address] [facility]  [facility]  Office  [phone]  CC: Primary care physician; No primary care provider on file.

## 2014-10-28 ENCOUNTER — Inpatient Hospital Stay: Payer: BLUE CROSS/BLUE SHIELD | Admitting: Family Medicine

## 2014-12-17 ENCOUNTER — Ambulatory Visit: Payer: BLUE CROSS/BLUE SHIELD | Admitting: Family Medicine

## 2015-02-20 ENCOUNTER — Emergency Department
Admission: EM | Admit: 2015-02-20 | Discharge: 2015-02-21 | Disposition: A | Discharge status: Home / Self Care | Payer: BLUE CROSS/BLUE SHIELD | Attending: Emergency Medicine | Admitting: Emergency Medicine

## 2015-02-20 ENCOUNTER — Encounter: Payer: Self-pay | Admitting: Emergency Medicine

## 2015-02-20 DIAGNOSIS — Z008 Encounter for other general examination: Secondary | ICD-10-CM | POA: Diagnosis present

## 2015-02-20 DIAGNOSIS — I10 Essential (primary) hypertension: Secondary | ICD-10-CM | POA: Diagnosis not present

## 2015-02-20 DIAGNOSIS — F1310 Sedative, hypnotic or anxiolytic abuse, uncomplicated: Secondary | ICD-10-CM | POA: Diagnosis not present

## 2015-02-20 DIAGNOSIS — Z79899 Other long term (current) drug therapy: Secondary | ICD-10-CM | POA: Diagnosis not present

## 2015-02-20 DIAGNOSIS — F17210 Nicotine dependence, cigarettes, uncomplicated: Secondary | ICD-10-CM | POA: Diagnosis not present

## 2015-02-20 DIAGNOSIS — Z7951 Long term (current) use of inhaled steroids: Secondary | ICD-10-CM | POA: Diagnosis not present

## 2015-02-20 DIAGNOSIS — Z7952 Long term (current) use of systemic steroids: Secondary | ICD-10-CM | POA: Insufficient documentation

## 2015-02-20 DIAGNOSIS — F1010 Alcohol abuse, uncomplicated: Secondary | ICD-10-CM | POA: Insufficient documentation

## 2015-02-20 DIAGNOSIS — Z7982 Long term (current) use of aspirin: Secondary | ICD-10-CM | POA: Diagnosis not present

## 2015-02-20 DIAGNOSIS — Z792 Long term (current) use of antibiotics: Secondary | ICD-10-CM | POA: Diagnosis not present

## 2015-02-20 LAB — CBC
HCT: 44.4 % (ref 40.0–52.0)
Hemoglobin: 15.3 g/dL (ref 13.0–18.0)
MCH: 37.5 pg — ABNORMAL HIGH (ref 26.0–34.0)
MCHC: 34.5 g/dL (ref 32.0–36.0)
MCV: 108.6 fL — ABNORMAL HIGH (ref 80.0–100.0)
PLATELETS: 196 10*3/uL (ref 150–440)
RBC: 4.09 MIL/uL — AB (ref 4.40–5.90)
RDW: 13.9 % (ref 11.5–14.5)
WBC: 6.2 10*3/uL (ref 3.8–10.6)

## 2015-02-20 LAB — COMPREHENSIVE METABOLIC PANEL
ALK PHOS: 60 U/L (ref 38–126)
ALT: 83 U/L — ABNORMAL HIGH (ref 17–63)
ANION GAP: 10 (ref 5–15)
AST: 77 U/L — ABNORMAL HIGH (ref 15–41)
Albumin: 4.2 g/dL (ref 3.5–5.0)
BILIRUBIN TOTAL: 0.3 mg/dL (ref 0.3–1.2)
BUN: 6 mg/dL (ref 6–20)
CALCIUM: 9.7 mg/dL (ref 8.9–10.3)
CO2: 27 mmol/L (ref 22–32)
Chloride: 103 mmol/L (ref 101–111)
Creatinine, Ser: 0.86 mg/dL (ref 0.61–1.24)
GFR calc non Af Amer: >60 mL/min (ref >60)
Glucose, Bld: 155 mg/dL — ABNORMAL HIGH (ref 65–99)
Potassium: 3.5 mmol/L (ref 3.5–5.1)
SODIUM: 140 mmol/L (ref 135–145)
TOTAL PROTEIN: 7.7 g/dL (ref 6.5–8.1)

## 2015-02-20 LAB — URINE DRUG SCREEN, QUALITATIVE (ARMC ONLY)
AMPHETAMINES, UR SCREEN: NOT DETECTED
Barbiturates, Ur Screen: NOT DETECTED
Benzodiazepine, Ur Scrn: POSITIVE — AB
Cannabinoid 50 Ng, Ur ~~LOC~~: NOT DETECTED
Cocaine Metabolite,Ur ~~LOC~~: NOT DETECTED
MDMA (ECSTASY) UR SCREEN: NOT DETECTED
METHADONE SCREEN, URINE: NOT DETECTED
Opiate, Ur Screen: NOT DETECTED
Phencyclidine (PCP) Ur S: NOT DETECTED
TRICYCLIC, UR SCREEN: POSITIVE — AB

## 2015-02-20 LAB — ETHANOL: Alcohol, Ethyl (B): 120 mg/dL — ABNORMAL HIGH (ref <5)

## 2015-02-20 MED ORDER — THIAMINE HCL 100 MG/ML IJ SOLN
100.0000 mg | Freq: Every day | INTRAMUSCULAR | Status: DC
  Administered 2015-02-20: 100 mg via INTRAVENOUS
  Filled 2015-02-20: qty 2

## 2015-02-20 MED ORDER — LORAZEPAM 2 MG/ML IJ SOLN
1.0000 mg | Freq: Once | INTRAMUSCULAR | Status: AC
  Administered 2015-02-20: 1 mg via INTRAVENOUS

## 2015-02-20 MED ORDER — SODIUM CHLORIDE 0.9 % IV BOLUS (SEPSIS)
1000.0000 mL | Freq: Once | INTRAVENOUS | Status: AC
  Administered 2015-02-20: 1000 mL via INTRAVENOUS

## 2015-02-20 MED ORDER — LORAZEPAM 2 MG PO TABS: 0.0000 mg | ORAL_TABLET | Freq: Two times a day (BID) | ORAL | Status: DC

## 2015-02-20 MED ORDER — LORAZEPAM 2 MG PO TABS: 0.0000 mg | ORAL_TABLET | Freq: Four times a day (QID) | ORAL | Status: DC

## 2015-02-20 MED ORDER — LORAZEPAM 2 MG/ML IJ SOLN
INTRAMUSCULAR | Status: AC
  Administered 2015-02-20: 1 mg via INTRAVENOUS
  Filled 2015-02-20: qty 1

## 2015-02-20 MED ORDER — LORAZEPAM 2 MG/ML IJ SOLN
0.0000 mg | Freq: Two times a day (BID) | INTRAMUSCULAR | Status: DC
Start: 2015-02-20 — End: 2015-02-21

## 2015-02-20 MED ORDER — LORAZEPAM 2 MG/ML IJ SOLN
0.0000 mg | Freq: Four times a day (QID) | INTRAMUSCULAR | Status: DC
  Administered 2015-02-20: 4 mg via INTRAVENOUS
  Administered 2015-02-21: 1 mg via INTRAVENOUS
  Filled 2015-02-20: qty 2

## 2015-02-20 MED ORDER — VITAMIN B-1 100 MG PO TABS: 100.0000 mg | ORAL_TABLET | Freq: Every day | ORAL | Status: DC

## 2015-02-20 NOTE — ED Notes (Signed)

## 2015-02-20 NOTE — ED Notes (Signed)
BEHAVIORAL HEALTH ROUNDING Patient sleeping: No. Patient alert and oriented: yes Behavior appropriate: Yes.  ; If no, describe:  Nutrition and fluids offered: Yes  Toileting and hygiene offered: Yes  Sitter present: not applicable Law enforcement present: No   

## 2015-02-20 NOTE — BH Specialist Note (Signed)
TTS spoke with Mr. Devin Bryant, who reports that he went to [facility] for detox, He states that he was told to that his BAL was too high for admission, He states that in addition, he had all his medications in one bottle and they needed him to have them in the original bottles.  He denied a history of seizures, DTs, or blackouts.  TTS attempted to contact [facility], but was unable to connect with staff at this time.

## 2015-02-20 NOTE — ED Provider Notes (Signed)
[facility] Emergency Department Provider Note   ____________________________________________  Time seen: 2040  I have reviewed the triage vital signs and the nursing notes.   HISTORY  Chief Complaint Medical Clearance   History limited by: Not Limited   HPI Devin Bryant is a 59 y.o. male who presents to the emergency department today because of desires for detox. The patient has been abusing alcohol. His drunk up to one fifth of alcohol a day. He did go to [facility] early today however they did not feel comfortable giving he had an alcohol level 0.21. Additionally he mixes all his medications and to one pill bottle. They sent him to the emergency department for slightly unclear reasons. The patient himself is not complaining of any recent fevers, chest pain or shortness breath. He has never had any complicated withdrawals. He specifically denies any withdrawal seizures or delirium tremens.   Past Medical History  Diagnosis Date  . Allergy   . Hypertension   . COPD (chronic obstructive pulmonary disease) (HCC)   . GERD (gastroesophageal reflux disease)   . Adjustment disorder with depressed mood   . ED (erectile dysfunction)   . Tobacco use   . Hypertriglyceridemia   . Elevated glucose   . Elevated serum glutamic pyruvic transaminase (SGPT) level   . Anxiety     Patient Active Problem List   Diagnosis Date Noted  . COPD exacerbation (HCC) 10/19/2014  . Tobacco abuse 10/19/2014  . Acute respiratory failure (HCC) 10/17/2014  . Anxiety disorder 10/17/2014  . Oral thrush 10/15/2014  . COPD with acute exacerbation (HCC) 10/15/2014  . Allergy   . Hypertension   . COPD (chronic obstructive pulmonary disease) (HCC)   . Adjustment disorder with depressed mood   . ED (erectile dysfunction)   . Tobacco use   . Hypertriglyceridemia   . Elevated glucose   . Elevated serum glutamic pyruvic transaminase (SGPT) level     Past Surgical History  Procedure  Laterality Date  . Septoplasty  Feb 2016  . Knee surgery      arthroscopic    Current Outpatient Rx  Name  Route  Sig  Dispense  Refill  . ADVAIR HFA 115-21 MCG/ACT inhaler   Inhalation   Inhale 2 puffs into the lungs 2 (two) times daily.            Dispense as written.   Marland Kitchen albuterol (PROVENTIL HFA;VENTOLIN HFA) 108 (90 BASE) MCG/ACT inhaler   Inhalation   Inhale 2 puffs into the lungs every 6 (six) hours as needed for wheezing or shortness of breath.          Marland Kitchen albuterol (PROVENTIL) (2.5 MG/3ML) 0.083% nebulizer solution   Nebulization   Take 3 mLs (2.5 mg total) by nebulization every 4 (four) hours as needed for wheezing or shortness of breath.   50 mL   3   . aspirin EC 81 MG tablet   Oral   Take 81 mg by mouth daily.         Marland Kitchen azithromycin (ZITHROMAX) 250 MG tablet   Oral   Take 1 tablet (250 mg total) by mouth daily.   5 each   0   . Cetirizine HCl (ZYRTEC ALLERGY) 10 MG CAPS   Oral   Take 10 mg by mouth daily.         . clonazePAM (KLONOPIN) 0.5 MG tablet   Oral   Take 1 tablet (0.5 mg total) by mouth 2 (two) times daily as  needed for anxiety.   45 tablet   0   . esomeprazole (NEXIUM) 20 MG capsule   Oral   Take 20 mg by mouth daily at 12 noon.         . fluconazole (DIFLUCAN) 200 MG tablet   Oral   Take 200 mg by mouth daily.         . fluticasone (FLONASE) 50 MCG/ACT nasal spray   Each Nare   Place 2 sprays into both nostrils daily.          . mometasone (ELOCON) 0.1 % ointment   Topical   Apply 1 application topically daily as needed (for ear.). Apply to each ear 1 drop daily as needed         . Multiple Vitamin (MULTIVITAMIN) tablet   Oral   Take 1 tablet by mouth daily.         Marland Kitchen. PARoxetine (PAXIL) 20 MG tablet   Oral   Take 20 mg by mouth daily.         . predniSONE (DELTASONE) 20 MG tablet   Oral   Take 2 tablets (40 mg total) by mouth daily with breakfast.   8 tablet   0   . tiotropium (SPIRIVA HANDIHALER) 18  MCG inhalation capsule   Inhalation   Place 1 capsule (18 mcg total) into inhaler and inhale daily.   30 capsule   0   . Umeclidinium Bromide (INCRUSE ELLIPTA) 62.5 MCG/INH AEPB   Inhalation   Inhale 1 Inhaler into the lungs daily.   1 each   0     Allergies Review of patient's allergies indicates no known allergies.  Family History  Problem Relation Age of Onset  . Diabetes Mother   . Cancer Father     lung    Social History Social History  Substance Use Topics  . Smoking status: Current Every Day Smoker -- 1.50 packs/day for 40 years    Types: Cigarettes  . Smokeless tobacco: Never Used  . Alcohol Use: Yes     Comment: drinks daily 6-7 cups of wine, beer, liquor    Review of Systems  Constitutional: Negative for fever. Cardiovascular: Negative for chest pain. Respiratory: Negative for shortness of breath. Gastrointestinal: Negative for abdominal pain, vomiting and diarrhea. Genitourinary: Negative for dysuria. Musculoskeletal: Negative for back pain. Skin: Negative for rash. Neurological: Negative for headaches, focal weakness or numbness.   10-point ROS otherwise negative.  ____________________________________________   PHYSICAL EXAM:  VITAL SIGNS: ED Triage Vitals  Enc Vitals Group     BP 02/20/15 1913 147/101 mmHg     Pulse Rate 02/20/15 1913 135     Resp 02/20/15 1913 18     Temp 02/20/15 1913 98 F (36.7 C)     Temp Source 02/20/15 1913 Oral     SpO2 02/20/15 1913 96 %     Weight 02/20/15 1913 180 lb (81.647 kg)     Height 02/20/15 1913 5\' 10"  (1.778 m)     Head Cir --      Peak Flow --      Pain Score 02/20/15 1914 4   Constitutional: Alert and oriented. Well appearing and in no distress. Slightly tremulous Eyes: Conjunctivae are normal. PERRL. Normal extraocular movements. ENT   Head: Normocephalic and atraumatic.   Nose: No congestion/rhinnorhea.   Mouth/Throat: Mucous membranes are moist.   Neck: No  stridor. Hematological/Lymphatic/Immunilogical: No cervical lymphadenopathy. Cardiovascular: Normal rate, regular rhythm.  No murmurs, rubs, or gallops. Respiratory:  Normal respiratory effort without tachypnea nor retractions. Breath sounds are clear and equal bilaterally. No wheezes/rales/rhonchi. Gastrointestinal: Soft and nontender. No distention.  Genitourinary: Deferred Musculoskeletal: Normal range of motion in all extremities. No joint effusions.  No lower extremity tenderness nor edema. Neurologic:  Normal speech and language. No gross focal neurologic deficits are appreciated.  Skin:  Skin is warm, dry and intact. No rash noted. Psychiatric: Mood and affect are normal. Speech and behavior are normal. Patient exhibits appropriate insight and judgment.  ____________________________________________    LABS (pertinent positives/negatives)  Labs Reviewed  COMPREHENSIVE METABOLIC PANEL - Abnormal; Notable for the following:    Glucose, Bld 155 (*)    AST 77 (*)    ALT 83 (*)    All other components within normal limits  ETHANOL - Abnormal; Notable for the following:    Alcohol, Ethyl (B) 120 (*)    All other components within normal limits  CBC - Abnormal; Notable for the following:    RBC 4.09 (*)    MCV 108.6 (*)    MCH 37.5 (*)    All other components within normal limits  URINE DRUG SCREEN, QUALITATIVE ([facility] ONLY) - Abnormal; Notable for the following:    Tricyclic, Ur Screen POSITIVE (*)    Benzodiazepine, Ur Scrn POSITIVE (*)    All other components within normal limits    ____________________________________________   EKG  I, Phineas Semen, attending physician, personally viewed and interpreted this EKG  EKG Time: 1922 Rate: 126 Rhythm: sinus tachycardia Axis: normal Intervals: qtc 437 QRS: narrow, q waves V1 ST changes: no st elevation Impression: abnormal ekg ____________________________________________     RADIOLOGY  None    ____________________________________________   PROCEDURES  Procedure(s) performed: None  Critical Care performed: No  ____________________________________________   INITIAL IMPRESSION / ASSESSMENT AND PLAN / ED COURSE  Pertinent labs & imaging results that were available during my care of the patient were reviewed by me and considered in my medical decision making (see chart for details).  Patient with history of alcohol abuse was sent here by [facility] for unclear reasons. The patient is tachycardic here likely secondary to dehydration and alcohol abuse. Alcohol level 120 today. Mildly elevated AST and ALT likely secondary to alcohol use. Physical exam is relatively benign just some slight tremors. Patient has no history of complicated withdrawal. Will give the patient some fluids to try to improve his heart rate. Will see what other options for detox are available.  ____________________________________________   FINAL CLINICAL IMPRESSION(S) / ED DIAGNOSES  Final diagnoses:  Alcohol abuse     Phineas Semen, MD 02/22/15 1511

## 2015-02-20 NOTE — ED Notes (Signed)
Pt presents tonight for detox; says he's a daily drinker of 6-7 liquor drinks or wine; went to [facility] today for admission for detox but was denied on account his blood alcohol level was 0.21; sent here for medical clearance; pt ambulatory with steady gait; talking in complete coherent sentences; pt says feeling "very jittery"; says he can only go about 4-5 hours before feeling withdrawal symptoms

## 2015-02-21 MED ORDER — SODIUM CHLORIDE 0.9 % IV BOLUS (SEPSIS)
1000.0000 mL | Freq: Once | INTRAVENOUS | Status: AC
Start: 2015-02-21 — End: 2015-02-21
  Administered 2015-02-21: 1000 mL via INTRAVENOUS

## 2015-02-21 MED ORDER — LORAZEPAM 2 MG PO TABS
2.0000 mg | ORAL_TABLET | Freq: Once | ORAL | Status: DC
  Filled 2015-02-21: qty 1

## 2015-02-21 MED ORDER — LORAZEPAM 2 MG/ML IJ SOLN
INTRAMUSCULAR | Status: AC
  Administered 2015-02-21: 1 mg via INTRAVENOUS
  Filled 2015-02-21: qty 1

## 2015-02-21 MED ORDER — CHLORDIAZEPOXIDE HCL 25 MG PO CAPS: ORAL_CAPSULE | ORAL | Status: DC

## 2015-02-21 NOTE — ED Notes (Signed)
BEHAVIORAL HEALTH ROUNDING  Patient sleeping: No.  Patient alert and oriented: yes  Behavior appropriate: Yes. ; If no, describe:  Nutrition and fluids offered: Yes  Toileting and hygiene offered: Yes  Sitter present: not applicable, Q 15 min safety rounds and observation via security camera. Law enforcement present: Yes [facility] ENVIRONMENTAL ASSESSMENT  Potentially harmful objects out of patient reach: Yes.  Personal belongings secured: Yes.  Patient dressed in hospital provided attire only: Yes.  Plastic bags out of patient reach: Yes.  Patient care equipment (cords, cables, call bells, lines, and drains) shortened, removed, or accounted for: Yes.  Equipment and supplies removed from bottom of stretcher: Yes.  Potentially toxic materials out of patient reach: Yes.  Sharps container removed or out of patient reach: Yes.

## 2015-02-21 NOTE — Discharge Instructions (Signed)
Please hold the Klonopin until after the librium taper is finished. Please seek medical attention for any high fevers, chest pain, shortness of breath, change in behavior, persistent vomiting, bloody stool or any other new or concerning symptoms.   Alcohol Use Disorder Alcohol use disorder is a mental disorder. It is not a one-time incident of heavy drinking. Alcohol use disorder is the excessive and uncontrollable use of alcohol over time that leads to problems with functioning in one or more areas of daily living. People with this disorder risk harming themselves and others when they drink to excess. Alcohol use disorder also can cause other mental disorders, such as mood and anxiety disorders, and serious physical problems. People with alcohol use disorder often misuse other drugs.  Alcohol use disorder is common and widespread. Some people with this disorder drink alcohol to cope with or escape from negative life events. Others drink to relieve chronic pain or symptoms of mental illness. People with a family history of alcohol use disorder are at higher risk of losing control and using alcohol to excess.  Drinking too much alcohol can cause injury, accidents, and health problems. One drink can be too much when you are:  Working.  Pregnant or breastfeeding.  Taking medicines. Ask your doctor.  Driving or planning to drive. SYMPTOMS  Signs and symptoms of alcohol use disorder may include the following:   Consumption ofalcohol inlarger amounts or over a longer period of time than intended.  Multiple unsuccessful attempts to cutdown or control alcohol use.   A great deal of time spent obtaining alcohol, using alcohol, or recovering from the effects of alcohol (hangover).  A strong desire or urge to use alcohol (cravings).   Continued use of alcohol despite problems at work, school, or home because of alcohol use.   Continued use of alcohol despite problems in relationships because  of alcohol use.  Continued use of alcohol in situations when it is physically hazardous, such as driving a car.  Continued use of alcohol despite awareness of a physical or psychological problem that is likely related to alcohol use. Physical problems related to alcohol use can involve the brain, heart, liver, stomach, and intestines. Psychological problems related to alcohol use include intoxication, depression, anxiety, psychosis, delirium, and dementia.   The need for increased amounts of alcohol to achieve the same desired effect, or a decreased effect from the consumption of the same amount of alcohol (tolerance).  Withdrawal symptoms upon reducing or stopping alcohol use, or alcohol use to reduce or avoid withdrawal symptoms. Withdrawal symptoms include:  Racing heart.  Hand tremor.  Difficulty sleeping.  Nausea.  Vomiting.  Hallucinations.  Restlessness.  Seizures. DIAGNOSIS Alcohol use disorder is diagnosed through an assessment by your health care provider. Your health care provider may start by asking three or four questions to screen for excessive or problematic alcohol use. To confirm a diagnosis of alcohol use disorder, at least two symptoms must be present within a 84-month period. The severity of alcohol use disorder depends on the number of symptoms:  Mild--two or three.  Moderate--four or five.  Severe--six or more. Your health care provider may perform a physical exam or use results from lab tests to see if you have physical problems resulting from alcohol use. Your health care provider may refer you to a mental health professional for evaluation. TREATMENT  Some people with alcohol use disorder are able to reduce their alcohol use to low-risk levels. Some people with alcohol use disorder need  to quit drinking alcohol. When necessary, mental health professionals with specialized training in substance use treatment can help. Your health care provider can help you  decide how severe your alcohol use disorder is and what type of treatment you need. The following forms of treatment are available:   Detoxification. Detoxification involves the use of prescription medicines to prevent alcohol withdrawal symptoms in the first week after quitting. This is important for people with a history of symptoms of withdrawal and for heavy drinkers who are likely to have withdrawal symptoms. Alcohol withdrawal can be dangerous and, in severe cases, cause death. Detoxification is usually provided in a hospital or in-patient substance use treatment facility.  Counseling or talk therapy. Talk therapy is provided by substance use treatment counselors. It addresses the reasons people use alcohol and ways to keep them from drinking again. The goals of talk therapy are to help people with alcohol use disorder find healthy activities and ways to cope with life stress, to identify and avoid triggers for alcohol use, and to handle cravings, which can cause relapse.  Medicines.Different medicines can help treat alcohol use disorder through the following actions:  Decrease alcohol cravings.  Decrease the positive reward response felt from alcohol use.  Produce an uncomfortable physical reaction when alcohol is used (aversion therapy).  Support groups. Support groups are run by people who have quit drinking. They provide emotional support, advice, and guidance. These forms of treatment are often combined. Some people with alcohol use disorder benefit from intensive combination treatment provided by specialized substance use treatment centers. Both inpatient and outpatient treatment programs are available.   This information is not intended to replace advice given to you by your health care provider. Make sure you discuss any questions you have with your health care provider.   Document Released: 03/29/2004 Document Revised: 03/12/2014 Document Reviewed: 05/29/2012 Elsevier Interactive  Patient Education [employer]2016 [employer]

## 2015-02-21 NOTE — ED Notes (Signed)
BEHAVIORAL HEALTH ROUNDING Patient sleeping: Yes.   Patient alert and oriented: not applicable SLEEPING Behavior appropriate: Yes.  ; If no, describe: SLEEPING Nutrition and fluids offered: No SLEEPING Toileting and hygiene offered: NoSLEEPING Sitter present: not applicable, Q 15 min safety rounds and observation via security camera. Law enforcement present: Yes [facility] 

## 2015-02-21 NOTE — BH Specialist Note (Signed)
The [facility] attempted to contact [employer]. The office was closed at this time.  The TTS created a list of facilities [place] that [employer] partners with for Detox/inpatient treatment for substance abuse and provided the list to Mr. Eileen StanfordLauglin and his wife.  The TTS further gave instruction to contact [employer] for additional information and resources in the morning after 8:00 a.m..Marland Kitchen

## 2015-03-11 ENCOUNTER — Encounter (HOSPITAL_COMMUNITY): Payer: Self-pay | Admitting: Emergency Medicine

## 2015-03-11 ENCOUNTER — Inpatient Hospital Stay (HOSPITAL_COMMUNITY)
Admission: EM | Admit: 2015-03-11 | Discharge: 2015-03-14 | DRG: 897 | Payer: BLUE CROSS/BLUE SHIELD | Attending: Internal Medicine | Admitting: Internal Medicine

## 2015-03-11 DIAGNOSIS — F4321 Adjustment disorder with depressed mood: Secondary | ICD-10-CM | POA: Diagnosis present

## 2015-03-11 DIAGNOSIS — F10239 Alcohol dependence with withdrawal, unspecified: Secondary | ICD-10-CM | POA: Diagnosis not present

## 2015-03-11 DIAGNOSIS — F17210 Nicotine dependence, cigarettes, uncomplicated: Secondary | ICD-10-CM | POA: Diagnosis present

## 2015-03-11 DIAGNOSIS — Z23 Encounter for immunization: Secondary | ICD-10-CM

## 2015-03-11 DIAGNOSIS — Z7982 Long term (current) use of aspirin: Secondary | ICD-10-CM | POA: Diagnosis not present

## 2015-03-11 DIAGNOSIS — J449 Chronic obstructive pulmonary disease, unspecified: Secondary | ICD-10-CM | POA: Diagnosis present

## 2015-03-11 DIAGNOSIS — Z720 Tobacco use: Secondary | ICD-10-CM | POA: Diagnosis not present

## 2015-03-11 DIAGNOSIS — F10230 Alcohol dependence with withdrawal, uncomplicated: Secondary | ICD-10-CM | POA: Diagnosis present

## 2015-03-11 DIAGNOSIS — F419 Anxiety disorder, unspecified: Secondary | ICD-10-CM | POA: Diagnosis present

## 2015-03-11 DIAGNOSIS — R112 Nausea with vomiting, unspecified: Secondary | ICD-10-CM | POA: Diagnosis present

## 2015-03-11 DIAGNOSIS — F10939 Alcohol use, unspecified with withdrawal, unspecified: Secondary | ICD-10-CM | POA: Diagnosis present

## 2015-03-11 DIAGNOSIS — K219 Gastro-esophageal reflux disease without esophagitis: Secondary | ICD-10-CM | POA: Diagnosis present

## 2015-03-11 DIAGNOSIS — J438 Other emphysema: Secondary | ICD-10-CM

## 2015-03-11 DIAGNOSIS — R000 Tachycardia, unspecified: Secondary | ICD-10-CM | POA: Diagnosis present

## 2015-03-11 DIAGNOSIS — I10 Essential (primary) hypertension: Secondary | ICD-10-CM | POA: Diagnosis present

## 2015-03-11 DIAGNOSIS — F10930 Alcohol use, unspecified with withdrawal, uncomplicated: Secondary | ICD-10-CM

## 2015-03-11 LAB — ETHANOL: Alcohol, Ethyl (B): <5 mg/dL (ref <5)

## 2015-03-11 LAB — CBC WITH DIFFERENTIAL/PLATELET
BASOS ABS: 0.1 10*3/uL (ref 0.0–0.1)
Basophils Relative: 1 %
Eosinophils Absolute: 0 10*3/uL (ref 0.0–0.7)
Eosinophils Relative: 0 %
HEMATOCRIT: 43.7 % (ref 39.0–52.0)
Hemoglobin: 14.8 g/dL (ref 13.0–17.0)
LYMPHS PCT: 6 %
Lymphs Abs: 0.6 10*3/uL — ABNORMAL LOW (ref 0.7–4.0)
MCH: 36.6 pg — ABNORMAL HIGH (ref 26.0–34.0)
MCHC: 33.9 g/dL (ref 30.0–36.0)
MCV: 108.2 fL — AB (ref 78.0–100.0)
MONO ABS: 0.5 10*3/uL (ref 0.1–1.0)
Monocytes Relative: 4 %
NEUTROS ABS: 9.2 10*3/uL — AB (ref 1.7–7.7)
Neutrophils Relative %: 89 %
Platelets: 242 10*3/uL (ref 150–400)
RBC: 4.04 MIL/uL — ABNORMAL LOW (ref 4.22–5.81)
RDW: 12.9 % (ref 11.5–15.5)
WBC: 10.4 10*3/uL (ref 4.0–10.5)

## 2015-03-11 LAB — COMPREHENSIVE METABOLIC PANEL
ALT: 54 U/L (ref 17–63)
AST: 55 U/L — AB (ref 15–41)
Albumin: 3.9 g/dL (ref 3.5–5.0)
Alkaline Phosphatase: 74 U/L (ref 38–126)
Anion gap: 13 (ref 5–15)
BILIRUBIN TOTAL: 0.7 mg/dL (ref 0.3–1.2)
BUN: 7 mg/dL (ref 6–20)
CALCIUM: 8.9 mg/dL (ref 8.9–10.3)
CO2: 25 mmol/L (ref 22–32)
CREATININE: 0.82 mg/dL (ref 0.61–1.24)
Chloride: 100 mmol/L — ABNORMAL LOW (ref 101–111)
GFR calc Af Amer: >60 mL/min (ref >60)
Glucose, Bld: 149 mg/dL — ABNORMAL HIGH (ref 65–99)
POTASSIUM: 4.1 mmol/L (ref 3.5–5.1)
Sodium: 138 mmol/L (ref 135–145)
TOTAL PROTEIN: 7.7 g/dL (ref 6.5–8.1)

## 2015-03-11 LAB — MAGNESIUM: MAGNESIUM: 1.8 mg/dL (ref 1.7–2.4)

## 2015-03-11 LAB — PHOSPHORUS: Phosphorus: 1.6 mg/dL — ABNORMAL LOW (ref 2.5–4.6)

## 2015-03-11 MED ORDER — ALBUTEROL SULFATE (2.5 MG/3ML) 0.083% IN NEBU: 2.5000 mg | INHALATION_SOLUTION | RESPIRATORY_TRACT | Status: DC | PRN

## 2015-03-11 MED ORDER — LORAZEPAM 2 MG/ML IJ SOLN
1.0000 mg | Freq: Once | INTRAMUSCULAR | Status: AC
  Administered 2015-03-11: 1 mg via INTRAVENOUS
  Filled 2015-03-11: qty 1

## 2015-03-11 MED ORDER — THIAMINE HCL 100 MG/ML IJ SOLN
100.0000 mg | Freq: Every day | INTRAMUSCULAR | Status: DC
  Administered 2015-03-11: 100 mg via INTRAVENOUS
  Filled 2015-03-11: qty 2

## 2015-03-11 MED ORDER — THIAMINE HCL 100 MG/ML IJ SOLN
100.0000 mg | Freq: Every day | INTRAMUSCULAR | Status: DC
  Filled 2015-03-11 (×4): qty 1

## 2015-03-11 MED ORDER — LORAZEPAM 2 MG/ML IJ SOLN: 1.0000 mg | INTRAMUSCULAR | Status: DC | PRN

## 2015-03-11 MED ORDER — FLURANDRENOLIDE 0.05 % EX LOTN: 1.0000 "application " | TOPICAL_LOTION | Freq: Two times a day (BID) | CUTANEOUS | Status: DC

## 2015-03-11 MED ORDER — THEOPHYLLINE ER 300 MG PO TB12
300.0000 mg | ORAL_TABLET | Freq: Two times a day (BID) | ORAL | Status: DC
  Administered 2015-03-11 – 2015-03-14 (×6): 300 mg via ORAL
  Filled 2015-03-11 (×7): qty 1

## 2015-03-11 MED ORDER — LORAZEPAM 2 MG/ML IJ SOLN: 1.0000 mg | Freq: Four times a day (QID) | INTRAMUSCULAR | Status: DC | PRN

## 2015-03-11 MED ORDER — ONDANSETRON HCL 4 MG PO TABS: 4.0000 mg | ORAL_TABLET | Freq: Four times a day (QID) | ORAL | Status: DC | PRN

## 2015-03-11 MED ORDER — MOMETASONE FUROATE 0.1 % EX OINT: 1.0000 "application " | TOPICAL_OINTMENT | Freq: Every day | CUTANEOUS | Status: DC | PRN

## 2015-03-11 MED ORDER — PANTOPRAZOLE SODIUM 40 MG PO TBEC
40.0000 mg | DELAYED_RELEASE_TABLET | Freq: Every day | ORAL | Status: DC
  Administered 2015-03-11 – 2015-03-14 (×4): 40 mg via ORAL
  Filled 2015-03-11 (×4): qty 1

## 2015-03-11 MED ORDER — SODIUM CHLORIDE 0.9 % IV SOLN
INTRAVENOUS | Status: DC
  Administered 2015-03-11 – 2015-03-14 (×4): via INTRAVENOUS

## 2015-03-11 MED ORDER — ENOXAPARIN SODIUM 40 MG/0.4ML ~~LOC~~ SOLN
40.0000 mg | SUBCUTANEOUS | Status: DC
  Administered 2015-03-12 – 2015-03-13 (×2): 40 mg via SUBCUTANEOUS
  Filled 2015-03-11 (×4): qty 0.4

## 2015-03-11 MED ORDER — FLUCONAZOLE 200 MG PO TABS: 200.0000 mg | ORAL_TABLET | Freq: Every day | ORAL | Status: DC | PRN

## 2015-03-11 MED ORDER — SODIUM CHLORIDE 0.9 % IJ SOLN
3.0000 mL | Freq: Two times a day (BID) | INTRAMUSCULAR | Status: DC
  Administered 2015-03-12 – 2015-03-13 (×2): 3 mL via INTRAVENOUS

## 2015-03-11 MED ORDER — ONDANSETRON HCL 4 MG/2ML IJ SOLN: 4.0000 mg | Freq: Four times a day (QID) | INTRAMUSCULAR | Status: DC | PRN

## 2015-03-11 MED ORDER — ASPIRIN EC 81 MG PO TBEC
81.0000 mg | DELAYED_RELEASE_TABLET | Freq: Every day | ORAL | Status: DC
  Administered 2015-03-11 – 2015-03-14 (×4): 81 mg via ORAL
  Filled 2015-03-11 (×4): qty 1

## 2015-03-11 MED ORDER — ONDANSETRON HCL 4 MG/2ML IJ SOLN
4.0000 mg | Freq: Once | INTRAMUSCULAR | Status: AC
  Administered 2015-03-11: 4 mg via INTRAVENOUS
  Filled 2015-03-11: qty 2

## 2015-03-11 MED ORDER — LORAZEPAM 1 MG PO TABS: 0.0000 mg | ORAL_TABLET | Freq: Two times a day (BID) | ORAL | Status: DC

## 2015-03-11 MED ORDER — VITAMIN B-1 100 MG PO TABS
100.0000 mg | ORAL_TABLET | Freq: Every day | ORAL | Status: DC
  Administered 2015-03-11 – 2015-03-14 (×4): 100 mg via ORAL
  Filled 2015-03-11 (×4): qty 1

## 2015-03-11 MED ORDER — FLUTICASONE PROPIONATE 50 MCG/ACT NA SUSP
1.0000 | Freq: Every day | NASAL | Status: DC
  Administered 2015-03-12 – 2015-03-14 (×3): 1 via NASAL
  Filled 2015-03-11: qty 16

## 2015-03-11 MED ORDER — FOLIC ACID 1 MG PO TABS
1.0000 mg | ORAL_TABLET | Freq: Every day | ORAL | Status: DC
  Administered 2015-03-11 – 2015-03-14 (×4): 1 mg via ORAL
  Filled 2015-03-11 (×4): qty 1

## 2015-03-11 MED ORDER — PAROXETINE HCL 20 MG PO TABS
20.0000 mg | ORAL_TABLET | Freq: Every day | ORAL | Status: DC
  Administered 2015-03-11 – 2015-03-14 (×4): 20 mg via ORAL
  Filled 2015-03-11 (×4): qty 1

## 2015-03-11 MED ORDER — LORAZEPAM 2 MG/ML IJ SOLN
0.0000 mg | Freq: Four times a day (QID) | INTRAMUSCULAR | Status: DC
  Administered 2015-03-11: 2 mg via INTRAVENOUS

## 2015-03-11 MED ORDER — SODIUM CHLORIDE 0.9 % IV BOLUS (SEPSIS)
1000.0000 mL | Freq: Once | INTRAVENOUS | Status: AC
  Administered 2015-03-11: 1000 mL via INTRAVENOUS

## 2015-03-11 MED ORDER — HYDRALAZINE HCL 20 MG/ML IJ SOLN
5.0000 mg | INTRAMUSCULAR | Status: DC | PRN
  Administered 2015-03-14: 5 mg via INTRAVENOUS
  Filled 2015-03-11: qty 1

## 2015-03-11 MED ORDER — LORAZEPAM 1 MG PO TABS: 0.0000 mg | ORAL_TABLET | Freq: Four times a day (QID) | ORAL | Status: DC

## 2015-03-11 MED ORDER — LORAZEPAM 1 MG PO TABS
1.0000 mg | ORAL_TABLET | Freq: Four times a day (QID) | ORAL | Status: DC | PRN
  Administered 2015-03-12 – 2015-03-13 (×3): 1 mg via ORAL
  Filled 2015-03-11 (×3): qty 1

## 2015-03-11 MED ORDER — TIOTROPIUM BROMIDE MONOHYDRATE 18 MCG IN CAPS
18.0000 ug | ORAL_CAPSULE | Freq: Every day | RESPIRATORY_TRACT | Status: DC
  Administered 2015-03-12 – 2015-03-14 (×3): 18 ug via RESPIRATORY_TRACT
  Filled 2015-03-11: qty 5

## 2015-03-11 MED ORDER — ADULT MULTIVITAMIN W/MINERALS CH
1.0000 | ORAL_TABLET | Freq: Every day | ORAL | Status: DC
  Administered 2015-03-11 – 2015-03-14 (×4): 1 via ORAL
  Filled 2015-03-11 (×4): qty 1

## 2015-03-11 MED ORDER — DESOXIMETASONE 0.25 % EX LIQD
Freq: Two times a day (BID) | CUTANEOUS | Status: DC | PRN
Start: 2015-03-11 — End: 2015-03-11

## 2015-03-11 MED ORDER — MOMETASONE FURO-FORMOTEROL FUM 100-5 MCG/ACT IN AERO
2.0000 | INHALATION_SPRAY | Freq: Two times a day (BID) | RESPIRATORY_TRACT | Status: DC
Start: 2015-03-11 — End: 2015-03-14
  Administered 2015-03-12 – 2015-03-14 (×5): 2 via RESPIRATORY_TRACT
  Filled 2015-03-11: qty 8.8

## 2015-03-11 MED ORDER — ALBUTEROL SULFATE HFA 108 (90 BASE) MCG/ACT IN AERS: 2.0000 | INHALATION_SPRAY | Freq: Four times a day (QID) | RESPIRATORY_TRACT | Status: DC | PRN

## 2015-03-11 MED ORDER — INFLUENZA VAC SPLIT QUAD 0.5 ML IM SUSY
0.5000 mL | PREFILLED_SYRINGE | INTRAMUSCULAR | Status: AC
  Administered 2015-03-12: 0.5 mL via INTRAMUSCULAR
  Filled 2015-03-11 (×3): qty 0.5

## 2015-03-11 MED ORDER — LORAZEPAM 2 MG/ML IJ SOLN
0.0000 mg | Freq: Two times a day (BID) | INTRAMUSCULAR | Status: DC
  Filled 2015-03-11: qty 1

## 2015-03-11 MED ORDER — VITAMIN B-1 100 MG PO TABS: 100.0000 mg | ORAL_TABLET | Freq: Every day | ORAL | Status: DC

## 2015-03-11 NOTE — ED Notes (Signed)
MD at bedside. 

## 2015-03-11 NOTE — ED Notes (Signed)
Bed: WA02 Expected date:  Expected time:  Means of arrival:  Comments: Hold for ETOH detox from [facility]

## 2015-03-11 NOTE — ED Notes (Signed)
Pt from home reports that [facility] and told he needed to be seen elsewhere for his conditions. Pt reports that he drinks 3 L and unknown amount of Vodka with his rx'd meds. Pt reports that he has been a heavy drinker for 40 years that his drinking has escalated over the years. Pt spouse reports that he drinks all day until he "passes out" and then wakes up in the morning a starts again/ Pt spouse adds that he is not eating lately. Pt states that he came in today because "I am ready." Pt is A&O and in NAD

## 2015-03-11 NOTE — ED Provider Notes (Signed)
CSN: [id number]     Arrival date & time 03/11/15  1144 History   First MD Initiated Contact with Patient 03/11/15 1204     Chief Complaint  Patient presents with  . Drug / Alcohol Assessment     (Consider location/radiation/quality/duration/timing/severity/associated sxs/prior Treatment) HPI  60 year old male who presents with concern for alcohol withdrawal. History of alcohol dependence, COPD, hypertension hyperlipidemia. States that he had presented to [facility] today for alcohol detox, but they were concerned about the severity of alcohol withdrawal that he was having so sent him to the ED for evaluation. He states that he normally drinks about 2 bottles of wine and up to one fifth of alcohol a day. States that his last drink was at around 3 AM today. This morning has also developed nausea and vomiting, tremors, and anxiety. Denies any seizures, hallucinations, suicidal or homicidal ideation. States that he has not had prior history of seizures with withdrawal or delirium tremens.  Past Medical History  Diagnosis Date  . Allergy   . Hypertension   . COPD (chronic obstructive pulmonary disease) (HCC)   . GERD (gastroesophageal reflux disease)   . Adjustment disorder with depressed mood   . ED (erectile dysfunction)   . Tobacco use   . Hypertriglyceridemia   . Elevated glucose   . Elevated serum glutamic pyruvic transaminase (SGPT) level   . Anxiety    Past Surgical History  Procedure Laterality Date  . Septoplasty  Feb 2016  . Knee surgery      arthroscopic   Family History  Problem Relation Age of Onset  . Diabetes Mother   . Cancer Father     lung   Social History  Substance Use Topics  . Smoking status: Current Every Day Smoker -- 1.50 packs/day for 40 years    Types: Cigarettes  . Smokeless tobacco: Never Used  . Alcohol Use: Yes     Comment: drinks daily 6-7 cups of wine, beer, liquor    Review of Systems 10/14 systems reviewed and are negative other than  those stated in the HPI    Allergies  Review of patient's allergies indicates no known allergies.  Home Medications   Prior to Admission medications   Medication Sig Start Date End Date Taking? Authorizing Provider  ADVAIR HFA 115-21 MCG/ACT inhaler Inhale 2 puffs into the lungs 2 (two) times daily.  10/11/14  Yes Historical Provider, MD  albuterol (PROVENTIL HFA;VENTOLIN HFA) 108 (90 BASE) MCG/ACT inhaler Inhale 2 puffs into the lungs every 6 (six) hours as needed for wheezing or shortness of breath.    Yes Historical Provider, MD  albuterol (PROVENTIL) (2.5 MG/3ML) 0.083% nebulizer solution Take 3 mLs (2.5 mg total) by nebulization every 4 (four) hours as needed for wheezing or shortness of breath. 10/15/14  Yes Kerman PasseyMelinda P Lada, MD  aspirin EC 81 MG tablet Take 81 mg by mouth daily.   Yes Historical Provider, MD  Cetirizine HCl (ZYRTEC ALLERGY) 10 MG CAPS Take 10 mg by mouth daily.   Yes Historical Provider, MD  clonazePAM (KLONOPIN) 0.5 MG tablet Take 1 tablet (0.5 mg total) by mouth 2 (two) times daily as needed for anxiety. 10/15/14  Yes Kerman PasseyMelinda P Lada, MD  esomeprazole (NEXIUM) 20 MG capsule Take 20 mg by mouth daily at 12 noon.   Yes Historical Provider, MD  fluconazole (DIFLUCAN) 200 MG tablet Take 200 mg by mouth daily as needed (thrus/ mouth pain.). Take 1 tablet daily= 200mg  for 14 days if needed for  thrush or mouth pain   Yes Historical Provider, MD  fluticasone (FLONASE) 50 MCG/ACT nasal spray Place 1 spray into both nostrils daily.  07/23/14  Yes Historical Provider, MD  HYDROcodone-homatropine (HYCODAN) 5-1.5 MG/5ML syrup Take 5 mLs by mouth every 6 (six) hours as needed. cough 12/28/14  Yes Historical Provider, MD  mometasone (ELOCON) 0.1 % ointment Apply 1 application topically daily as needed (for ear irritation/eczema in ear). Apply to each ear 1 drop daily as needed   Yes Historical Provider, MD  Multiple Vitamin (MULTIVITAMIN) tablet Take 1 tablet by mouth daily.   Yes Historical  Provider, MD  nystatin (MYCOSTATIN) 100000 UNIT/ML suspension Take 5 mLs by mouth 4 (four) times daily as needed. thrush 02/08/15  Yes Historical Provider, MD  PARoxetine (PAXIL) 20 MG tablet Take 20 mg by mouth daily.   Yes Historical Provider, MD  theophylline (THEODUR) 300 MG 12 hr tablet Take 300 mg by mouth 2 (two) times daily. 03/02/15  Yes Historical Provider, MD  tiotropium (SPIRIVA HANDIHALER) 18 MCG inhalation capsule Place 1 capsule (18 mcg total) into inhaler and inhale daily. 10/19/14  Yes Srikar Sudini, MD  azithromycin (ZITHROMAX) 250 MG tablet Take 1 tablet (250 mg total) by mouth daily. Patient not taking: Reported on 03/11/2015 10/19/14   Milagros Loll, MD  chlordiazePOXIDE (LIBRIUM) 25 MG capsule Day 1 - 50 mg PO every 6 hours Day 2 - 25 mg PO every 6 hours Day 3 - 25 mg PO BID Day 4 - 25 mg PO at night Patient not taking: Reported on 03/11/2015 02/21/15   Phineas Semen, MD  CORDRAN 0.05 % lotion Apply 1 application topically 2 (two) times daily. Apply to face 03/09/15   Historical Provider, MD  Desoximetasone (TOPICORT SPRAY EX) Apply 1 application topically 2 (two) times daily as needed (scorisis/irritation over body. Use all over except for on face.). Reported on 03/11/2015    Historical Provider, MD  predniSONE (DELTASONE) 20 MG tablet Take 2 tablets (40 mg total) by mouth daily with breakfast. Patient not taking: Reported on 03/11/2015 10/19/14   Milagros Loll, MD  Umeclidinium Bromide (INCRUSE ELLIPTA) 62.5 MCG/INH AEPB Inhale 1 Inhaler into the lungs daily. Patient not taking: Reported on 03/11/2015 10/15/14   Kerman Passey, MD   BP 145/86 mmHg  Pulse 111  Temp(Src) 98.1 F (36.7 C) (Oral)  Resp 13  SpO2 93% Physical Exam  Physical Exam  Nursing note and vitals reviewed. Constitutional: Well developed, well nourished, non-toxic, and in no acute distress, anxious appearing and tremulous Head: Normocephalic and atraumatic.  Mouth/Throat: Oropharynx is clear and moist.  Neck:  Normal range of motion. Neck supple.  Cardiovascular: Tachycardic rate and regular rhythm.   Pulmonary/Chest: Effort normal and breath sounds normal.  Abdominal: Soft. There is no tenderness. There is no rebound and no guarding.  Musculoskeletal: Normal range of motion.  Neurological: Alert, no facial droop, fluent speech, moves all extremities symmetrically Skin: Skin is warm and dry.  Psychiatric: Cooperative '  ED Course  Procedures (including critical care time) Labs Review Labs Reviewed  CBC WITH DIFFERENTIAL/PLATELET - Abnormal; Notable for the following:    RBC 4.04 (*)    MCV 108.2 (*)    MCH 36.6 (*)    Neutro Abs 9.2 (*)    Lymphs Abs 0.6 (*)    All other components within normal limits  COMPREHENSIVE METABOLIC PANEL - Abnormal; Notable for the following:    Chloride 100 (*)    Glucose, Bld 149 (*)  AST 55 (*)    All other components within normal limits  PHOSPHORUS - Abnormal; Notable for the following:    Phosphorus 1.6 (*)    All other components within normal limits  MAGNESIUM  ETHANOL    Imaging Review No results found. I have personally reviewed and evaluated these images and lab results as part of my medical decision-making.   EKG Interpretation   Date/Time:  Friday March 11 2015 12:00:43 EST Ventricular Rate:  129 PR Interval:  157 QRS Duration: 89 QT Interval:  302 QTC Calculation: 442 R Axis:   87 Text Interpretation:  Sinus tachycardia Borderline right axis deviation  Baseline wander in lead(s) V6 Aside from tachycardia no significant change   Confirmed by LIU MD, DANA ([id number]) on 03/11/2015 12:04:20 PM      MDM   Final diagnoses:  Alcohol withdrawal, uncomplicated (HCC)    Presenting with alcohol withdrawal. Is tremulous and anxious on presentation with tachycardia to 127. Is slightly hypertensive. With initial CT was score of 12 and received 2 g of Ativan to good effect. Has received 1 L of fluids as well. Heart rate downtrending to  110s, and he is sleepy but easily arousable and conversational. States that he feels significantly improved after treatment with Ativan. No significant metabolic or ultralight derangements noted on blood work. Discussed with Dr. Izola Price who will admit for alcohol withdrawal.    Lavera Guise, MD 03/11/15 1354

## 2015-03-11 NOTE — ED Notes (Signed)
Pt still very shaky. Per Admitting give 1 mg and continue to follow CIWA score 12

## 2015-03-11 NOTE — H&P (Addendum)
[facility] History and Physical  Devin HusbandsDavid Bryant ZOX:[id number] DOB: [birth year] DOA: 03/11/2015  Referring physician: ED physician,  Dr. Verdie MosherLiu  PCP: Baruch GoutyMelinda Lada, MD   Chief Complaint: alcohol withdrawal   HPI:  60 year old male with known alcohol use, COPD, presented earlier in the day to [facility] but was referred to the ED due significant withdrawal with nausea and vomiting, tremors. Pt reports he typically drinks about 2 bottles of wine and up to one fifth of alcohol a day. States that his last drink was at around 3 AM today. This morning has also developed nausea and vomiting, tremors, and anxiety. Denies any seizures, hallucinations, suicidal or homicidal ideation.   In ED, pt with tremors but hemodynamically stable, VSS except tachycardia up to 127. Blood work unremarkable. [facility] asked to admit to telemetry unit for further evaluation.   Assessment and Plan:  Principal Problem:   Alcohol withdrawal (HCC) - admit to telemetry unit - place on CIWA protocol - place on IVF, antiemetics as needed   Active Problems:   Tobacco abuse - counseled on cessation     COPD (chronic obstructive pulmonary disease) (HCC) - stable oxygen saturation  - allow BD's as needed   DVT prophylaxis - Lovenox SQ  Radiological Exams on Admission: No results found.   Code Status: Full Family Communication: Pt at bedside Disposition Plan: Admit for further evaluation    Devin Binderskra Devin Bryant Devin Bryant [phone]   Review of Systems:  Constitutional: Negative for fever, chills and malaise/fatigue. Negative for diaphoresis.  HENT: Negative for hearing loss, ear pain, nosebleeds, congestion Eyes: Negative for blurred vision, double vision, photophobia, pain, discharge and redness.  Respiratory: Negative for cough, hemoptysis, sputum production, shortness of breath, wheezing and stridor.   Cardiovascular: Negative for chest pain, palpitations, orthopnea, claudication and leg swelling.  Gastrointestinal:  Negative for abdominal pain.  Genitourinary: Negative for dysuria, urgency, frequency, hematuria and flank pain.  Musculoskeletal: Negative for myalgias, back pain, joint pain and falls.  Skin: Negative for itching and rash.  Neurological: Negative for dizziness and weakness.  Endo/Heme/Allergies: Negative for environmental allergies and polydipsia. Does not bruise/bleed easily.  Psychiatric/Behavioral: Negative for suicidal ideas. The patient is not nervous/anxious.      Past Medical History  Diagnosis Date  . Allergy   . Hypertension   . COPD (chronic obstructive pulmonary disease) (HCC)   . GERD (gastroesophageal reflux disease)   . Adjustment disorder with depressed mood   . ED (erectile dysfunction)   . Tobacco use   . Hypertriglyceridemia   . Elevated glucose   . Elevated serum glutamic pyruvic transaminase (SGPT) level   . Anxiety     Past Surgical History  Procedure Laterality Date  . Septoplasty  Feb 2016  . Knee surgery      arthroscopic    Social History:  reports that he has been smoking Cigarettes.  He has a 60 pack-year smoking history. He has never used smokeless tobacco. He reports that he drinks alcohol. He reports that he does not use illicit drugs.  No Known Allergies  Family History  Problem Relation Age of Onset  . Diabetes Mother   . Cancer Father     lung    Medication Sig  ADVAIR HFA 115-21 MCG/ACT inhaler Inhale 2 puffs into the lungs 2 (two) times daily.   albuterol (PROVENTIL HFA;VENTOLIN HFA) 108 (90 BASE) MCG/ACT inhaler Inhale 2 puffs into the lungs every 6 (six) hours as needed for wheezing or shortness of breath.   albuterol (PROVENTIL) (  2.5 MG/3ML) 0.083% nebulizer solution Take 3 mLs (2.5 mg total) by nebulization every 4 (four) hours as needed for wheezing or shortness of breath.  aspirin EC 81 MG tablet Take 81 mg by mouth daily.  Cetirizine HCl (ZYRTEC ALLERGY) 10 MG CAPS Take 10 mg by mouth daily.  clonazePAM (KLONOPIN) 0.5 MG  tablet Take 1 tablet (0.5 mg total) by mouth 2 (two) times daily as needed for anxiety.  esomeprazole (NEXIUM) 20 MG capsule Take 20 mg by mouth daily at 12 noon.  fluconazole (DIFLUCAN) 200 MG tablet Take 200 mg by mouth daily as needed (thrus/ mouth pain.). Take 1 tablet daily= 200mg  for 14 days if needed for thrush or mouth pain  fluticasone (FLONASE) 50 MCG/ACT nasal spray Place 1 spray into both nostrils daily.   HYDROcodone-homatropine (HYCODAN) 5-1.5 MG/5ML syrup Take 5 mLs by mouth every 6 (six) hours as needed. cough  mometasone (ELOCON) 0.1 % ointment Apply 1 application topically daily as needed (for ear irritation/eczema in ear). Apply to each ear 1 drop daily as needed  Multiple Vitamin (MULTIVITAMIN) tablet Take 1 tablet by mouth daily.  nystatin (MYCOSTATIN) 100000 UNIT/ML suspension Take 5 mLs by mouth 4 (four) times daily as needed. thrush  PARoxetine (PAXIL) 20 MG tablet Take 20 mg by mouth daily.  theophylline (THEODUR) 300 MG 12 hr tablet Take 300 mg by mouth 2 (two) times daily.  tiotropium (SPIRIVA HANDIHALER) 18 MCG inhalation capsule Place 1 capsule (18 mcg total) into inhaler and inhale daily.  azithromycin (ZITHROMAX) 250 MG tablet Take 1 tablet (250 mg total) by mouth daily. Patient not taking: Reported on 03/11/2015  chlordiazePOXIDE (LIBRIUM) 25 MG capsule Day 1 - 50 mg PO every 6 hours Day 2 - 25 mg PO every 6 hours Day 3 - 25 mg PO BID Day 4 - 25 mg PO at night Patient not taking: Reported on 03/11/2015  CORDRAN 0.05 % lotion Apply 1 application topically 2 (two) times daily. Apply to face  Desoximetasone (TOPICORT SPRAY EX) Apply 1 application topically 2 (two) times daily as needed (scorisis/irritation over body. Use all over except for on face.). Reported on 03/11/2015  predniSONE (DELTASONE) 20 MG tablet Take 2 tablets (40 mg total) by mouth daily with breakfast. Patient not taking: Reported on 03/11/2015  Umeclidinium Bromide (INCRUSE ELLIPTA) 62.5 MCG/INH AEPB  Inhale 1 Inhaler into the lungs daily. Patient not taking: Reported on 03/11/2015    Physical Exam: Filed Vitals:   03/11/15 1225 03/11/15 1226 03/11/15 1300 03/11/15 1330  BP: 162/92 162/92 150/80 145/86  Pulse: 127 127 118 111  Temp:      TempSrc:      Resp:   18 13  SpO2:   91% 93%    Physical Exam  Constitutional: Appears well-developed and well-nourished. No distress.  HENT: Normocephalic. External right and left ear normal. Dry MM Eyes: Conjunctivae and EOM are normal. PERRLA, no scleral icterus.  Neck: Normal ROM. Neck supple. No JVD. No tracheal deviation. No thyromegaly.  CVS: Regular rhythm, tachycardic, S1/S2 +, no murmurs, no gallops, no carotid bruit.  Pulmonary: Effort and breath sounds normal, no stridor, rhonchi, wheezes, rales.  Abdominal: Soft. BS +,  no distension, tenderness, rebound or guarding.  Musculoskeletal: Normal range of motion. No edema and no tenderness.  Lymphadenopathy: No lymphadenopathy noted, cervical, inguinal. Neuro: Alert. Normal reflexes, muscle tone coordination. Resting tremor noted  Skin: Skin is warm and dry. No rash noted. Not diaphoretic. No erythema. No pallor.  Psychiatric: Normal mood and  affect. Behavior, judgment, thought content normal.   Labs on Admission:  Basic Metabolic Panel:  Recent Labs Lab 03/11/15 1234  NA 138  K 4.1  CL 100*  CO2 25  GLUCOSE 149*  BUN 7  CREATININE 0.82  CALCIUM 8.9  MG 1.8  PHOS 1.6*   Liver Function Tests:  Recent Labs Lab 03/11/15 1234  AST 55*  ALT 54  ALKPHOS 74  BILITOT 0.7  PROT 7.7  ALBUMIN 3.9   CBC:  Recent Labs Lab 03/11/15 1234  WBC 10.4  NEUTROABS 9.2*  HGB 14.8  HCT 43.7  MCV 108.2*  PLT 242   EKG: Normal sinus rhythm, tachycardia   If 7PM-7AM, please contact night-coverage [email or web address] Password [email or web address] 03/11/2015, 1:56 PM

## 2015-03-12 LAB — CBC
HCT: 40.7 % (ref 39.0–52.0)
HEMOGLOBIN: 13.3 g/dL (ref 13.0–17.0)
MCH: 36.1 pg — AB (ref 26.0–34.0)
MCHC: 32.7 g/dL (ref 30.0–36.0)
MCV: 110.6 fL — AB (ref 78.0–100.0)
Platelets: 217 10*3/uL (ref 150–400)
RBC: 3.68 MIL/uL — AB (ref 4.22–5.81)
RDW: 13 % (ref 11.5–15.5)
WBC: 6.6 10*3/uL (ref 4.0–10.5)

## 2015-03-12 LAB — BASIC METABOLIC PANEL
ANION GAP: 6 (ref 5–15)
BUN: 6 mg/dL (ref 6–20)
CHLORIDE: 105 mmol/L (ref 101–111)
CO2: 30 mmol/L (ref 22–32)
CREATININE: 0.81 mg/dL (ref 0.61–1.24)
Calcium: 8.4 mg/dL — ABNORMAL LOW (ref 8.9–10.3)
GFR calc non Af Amer: >60 mL/min (ref >60)
GLUCOSE: 100 mg/dL — AB (ref 65–99)
Potassium: 4.7 mmol/L (ref 3.5–5.1)
Sodium: 141 mmol/L (ref 135–145)

## 2015-03-12 MED ORDER — K PHOS MONO-SOD PHOS DI & MONO 155-852-130 MG PO TABS
250.0000 mg | ORAL_TABLET | Freq: Two times a day (BID) | ORAL | Status: DC
  Administered 2015-03-12 – 2015-03-13 (×3): 250 mg via ORAL
  Filled 2015-03-12 (×4): qty 1

## 2015-03-12 MED ORDER — ZOLPIDEM TARTRATE 5 MG PO TABS
5.0000 mg | ORAL_TABLET | Freq: Once | ORAL | Status: AC
  Administered 2015-03-13: 5 mg via ORAL
  Filled 2015-03-12: qty 1

## 2015-03-12 MED ORDER — NICOTINE 21 MG/24HR TD PT24
21.0000 mg | MEDICATED_PATCH | Freq: Every day | TRANSDERMAL | Status: DC
  Administered 2015-03-12 – 2015-03-14 (×3): 21 mg via TRANSDERMAL
  Filled 2015-03-12 (×3): qty 1

## 2015-03-12 NOTE — Progress Notes (Signed)
Patient ID: Devin HusbandsDavid Hedman, male   DOB: Feb 15, 1956, 60 y.o.   MRN: [id number]  [facility] PROGRESS NOTE  Devin HusbandsDavid Lorio [id number] DOB: Feb 15, 1956 DOA: 03/11/2015 PCP: Baruch GoutyMelinda Lada, MD   Brief narrative:    60 year old male with known alcohol use, COPD, presented earlier in the day to [facility] but was referred to the ED due significant withdrawal with nausea and vomiting, tremors. Pt reports he typically drinks about 2 bottles of wine and up to one fifth of alcohol a day. States that his last drink was at around 3 AM today. This morning has also developed nausea and vomiting, tremors, and anxiety. Denies any seizures, hallucinations, suicidal or homicidal ideation.   In ED, pt with tremors but hemodynamically stable, VSS except tachycardia up to 127. Blood work unremarkable. [facility] asked to admit to telemetry unit for further evaluation.   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  Family Communication:  plan of care discussed with the patient Disposition Plan: Home when off CIWA  IV access:  Peripheral IV  Procedures and diagnostic studies:    No results found.  Medical Consultants:  None   Other Consultants:  None  IAnti-Infectives:   None  Debbora PrestoMAGICK-Lilya Smitherman, MD  [facility] Pager [phone]  If 7PM-7AM, please contact night-coverage [email or web address] Password [email or web address] 03/12/2015, 12:27 PM   LOS: 1 day    HPI/Subjective: No events overnight.   Objective: Filed Vitals:   03/11/15 1845 03/11/15 2153 03/12/15 0453 03/12/15 0846  BP: 155/86 156/81 134/76   Pulse:  96 86   Temp:  98.2 F (36.8 C) 97.9 F (36.6 C)   TempSrc:  Oral Oral   Resp:  20 16   Height:      Weight:      SpO2:  92% 96% 92%    Intake/Output Summary (Last 24 hours) at 03/12/15 1227 Last data filed at 03/12/15 0454  Gross per 24 hour  Intake      0 ml  Output    300 ml  Net   -300 ml    Exam:   General:  Pt is alert, follows commands appropriately,shaky at rest   Cardiovascular: Regular rate and rhythm, S1/S2, no murmurs, no rubs, no gallops  Respiratory: Clear to auscultation bilaterally, no wheezing, no crackles, no rhonchi  Abdomen: Soft, non tender, non distended, bowel sounds present, no guarding   Data Reviewed: Basic Metabolic Panel:  Recent Labs Lab 03/11/15 1234 03/12/15 0519  NA 138 141  K 4.1 4.7  CL 100* 105  CO2 25 30  GLUCOSE 149* 100*  BUN 7 6  CREATININE 0.82 0.81  CALCIUM 8.9 8.4*  MG 1.8  --   PHOS 1.6*  --    Liver Function Tests:  Recent Labs Lab 03/11/15 1234  AST 55*  ALT 54  ALKPHOS 74  BILITOT 0.7  PROT 7.7  ALBUMIN 3.9   CBC:  Recent Labs  Lab 03/11/15 1234 03/12/15 0519  WBC 10.4 6.6  NEUTROABS 9.2*  --   HGB 14.8 13.3  HCT 43.7 40.7  MCV 108.2* 110.6*  PLT 242 217   Scheduled Meds: . aspirin EC  81 mg Oral Daily  . enoxaparin  injection  40 mg Subcutaneous Q24H  . fluticasone  1 spray Each Nare Daily  . folic acid  1 mg Oral Daily  . mometasone-formoterol  2 puff Inhalation BID  . pantoprazole  40 mg Oral Daily  . PARoxetine  20 mg Oral Daily  . theophylline  300 mg Oral BID  . thiamine  100 mg Oral Daily  . tiotropium  18 mcg Inhalation Daily   Continuous Infusions: . sodium chloride 75 mL/hr at 03/12/15 [phone]

## 2015-03-13 LAB — COMPREHENSIVE METABOLIC PANEL
ALT: 41 U/L (ref 17–63)
AST: 43 U/L — ABNORMAL HIGH (ref 15–41)
Albumin: 3.3 g/dL — ABNORMAL LOW (ref 3.5–5.0)
Alkaline Phosphatase: 62 U/L (ref 38–126)
Anion gap: 11 (ref 5–15)
BILIRUBIN TOTAL: 0.6 mg/dL (ref 0.3–1.2)
BUN: 5 mg/dL — ABNORMAL LOW (ref 6–20)
CHLORIDE: 102 mmol/L (ref 101–111)
CO2: 29 mmol/L (ref 22–32)
CREATININE: 0.79 mg/dL (ref 0.61–1.24)
Calcium: 8.8 mg/dL — ABNORMAL LOW (ref 8.9–10.3)
Glucose, Bld: 105 mg/dL — ABNORMAL HIGH (ref 65–99)
Potassium: 3.5 mmol/L (ref 3.5–5.1)
Sodium: 142 mmol/L (ref 135–145)
TOTAL PROTEIN: 6.4 g/dL — AB (ref 6.5–8.1)

## 2015-03-13 LAB — CBC
HCT: 41.9 % (ref 39.0–52.0)
Hemoglobin: 13.9 g/dL (ref 13.0–17.0)
MCH: 36 pg — ABNORMAL HIGH (ref 26.0–34.0)
MCHC: 33.2 g/dL (ref 30.0–36.0)
MCV: 108.5 fL — AB (ref 78.0–100.0)
PLATELETS: 207 10*3/uL (ref 150–400)
RBC: 3.86 MIL/uL — AB (ref 4.22–5.81)
RDW: 12.7 % (ref 11.5–15.5)
WBC: 6.4 10*3/uL (ref 4.0–10.5)

## 2015-03-13 LAB — PHOSPHORUS: PHOSPHORUS: 4.1 mg/dL (ref 2.5–4.6)

## 2015-03-13 MED ORDER — ADULT MULTIVITAMIN W/MINERALS CH: 1.0000 | ORAL_TABLET | Freq: Every day | ORAL | Status: DC

## 2015-03-13 MED ORDER — LORAZEPAM 2 MG/ML IJ SOLN
0.0000 mg | Freq: Four times a day (QID) | INTRAMUSCULAR | Status: DC
Start: 2015-03-13 — End: 2015-03-14
  Administered 2015-03-13 – 2015-03-14 (×3): 1 mg via INTRAVENOUS
  Administered 2015-03-14: 2 mg via INTRAVENOUS
  Filled 2015-03-13 (×4): qty 1

## 2015-03-13 MED ORDER — THIAMINE HCL 100 MG/ML IJ SOLN: 100.0000 mg | Freq: Every day | INTRAMUSCULAR | Status: DC

## 2015-03-13 MED ORDER — ZOLPIDEM TARTRATE 5 MG PO TABS
5.0000 mg | ORAL_TABLET | Freq: Every evening | ORAL | Status: DC | PRN
  Administered 2015-03-13: 5 mg via ORAL
  Filled 2015-03-13: qty 1

## 2015-03-13 MED ORDER — FOLIC ACID 1 MG PO TABS: 1.0000 mg | ORAL_TABLET | Freq: Every day | ORAL | Status: DC

## 2015-03-13 MED ORDER — LORAZEPAM 2 MG/ML IJ SOLN: 0.0000 mg | Freq: Two times a day (BID) | INTRAMUSCULAR | Status: DC

## 2015-03-13 MED ORDER — LORAZEPAM 2 MG/ML IJ SOLN: 1.0000 mg | Freq: Four times a day (QID) | INTRAMUSCULAR | Status: DC | PRN

## 2015-03-13 MED ORDER — VITAMIN B-1 100 MG PO TABS: 100.0000 mg | ORAL_TABLET | Freq: Every day | ORAL | Status: DC

## 2015-03-13 MED ORDER — LORAZEPAM 1 MG PO TABS: 1.0000 mg | ORAL_TABLET | Freq: Four times a day (QID) | ORAL | Status: DC | PRN

## 2015-03-13 NOTE — Progress Notes (Signed)
Patient ID: Devin Bryant, male   DOB: 1955/11/11, 60 y.o.   MRN: [id number]  [facility] PROGRESS NOTE  Devin Bryant [id number] DOB: 06-10-1955 DOA: 03/11/2015 PCP: Baruch Gouty, MD   Brief narrative:    60 year old male with known alcohol use, COPD, presented earlier in the day to [facility] but was referred to the ED due significant withdrawal with nausea and vomiting, tremors. Pt reports he typically drinks about 2 bottles of wine and up to one fifth of alcohol a day. States that his last drink was at around 3 AM today. This morning has also developed nausea and vomiting, tremors, and anxiety. Denies any seizures, hallucinations, suicidal or homicidal ideation.   In ED, pt with tremors but hemodynamically stable, VSS except tachycardia up to 127. Blood work unremarkable. [facility] asked to admit to telemetry unit for further evaluation.   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  Family Communication:  plan of care discussed with the patient Disposition Plan: Home 03/14/2015  IV access:  Peripheral IV  Procedures and diagnostic studies:    No results found.  Medical Consultants:  None   Other Consultants:  None  IAnti-Infectives:   None  Debbora Presto, MD  [facility] Pager [phone]  If 7PM-7AM, please contact night-coverage [email or web address] Password [email or web address] 03/13/2015, 11:01  AM   LOS: 2 days   HPI/Subjective: No events overnight. Still shaky  Objective: Filed Vitals:   03/12/15 1745 03/12/15 2134 03/13/15 0546 03/13/15 0832  BP:  142/70 155/99   Pulse:  92 86   Temp:  98.6 F (37 C) 97.8 F (36.6 C)   TempSrc:  Oral Oral   Resp:  18 18   Height:      Weight: 80 kg (176 lb 5.9 oz)     SpO2:  96% 98% 96%    Intake/Output Summary (Last 24 hours) at 03/13/15 1101 Last data filed at 03/13/15 0600  Gross per 24 hour  Intake   1200 ml  Output    400 ml  Net    800 ml    Exam:   General:  Pt is alert, follows commands appropriately,shaky at rest   Cardiovascular: Regular rate and rhythm, S1/S2, no murmurs, no rubs, no gallops  Respiratory: Clear to auscultation bilaterally, no wheezing, no crackles, no rhonchi  Abdomen: Soft, non tender, non distended, bowel sounds present, no guarding   Data Reviewed: Basic Metabolic Panel:  Recent Labs Lab 03/11/15 1234 03/12/15 0519 03/13/15 0522  NA 138 141 142  K 4.1 4.7 3.5  CL 100* 105 102  CO2 25 30 29   GLUCOSE 149* 100* 105*  BUN 7 6 5*  CREATININE 0.82 0.81 0.79  CALCIUM 8.9 8.4* 8.8*  MG 1.8  --   --   PHOS 1.6*  --  4.1   Liver Function Tests:  Recent Labs Lab 03/11/15 1234 03/13/15 0522  AST 55* 43*  ALT 54 41  ALKPHOS 74 62  BILITOT 0.7 0.6  PROT 7.7 6.4*  ALBUMIN 3.9 3.3*   CBC:  Recent Labs Lab 03/11/15 1234 03/12/15 0519 03/13/15 0522  WBC 10.4 6.6 6.4  NEUTROABS 9.2*  --   --   HGB 14.8 13.3 13.9  HCT 43.7 40.7 41.9  MCV 108.2* 110.6* 108.5*  PLT 242 217 207   Scheduled Meds: . aspirin EC  81 mg Oral Daily  . enoxaparin  injection  40 mg Subcutaneous Q24H  . fluticasone  1 spray Each Nare Daily  . folic acid  1 mg Oral Daily  . mometasone-formoterol  2 puff Inhalation BID  . pantoprazole  40 mg Oral Daily  . PARoxetine  20 mg Oral Daily  . theophylline  300 mg Oral BID  . thiamine  100 mg Oral Daily  . tiotropium  18 mcg Inhalation Daily    Continuous Infusions: . sodium chloride 50 mL/hr at 03/12/15 1423

## 2015-03-13 NOTE — Progress Notes (Signed)
Earlier wife wanted someone to fax [facility] to check to see if they will still accept patient.  Message left with social worker on call.

## 2015-03-14 DIAGNOSIS — F10230 Alcohol dependence with withdrawal, uncomplicated: Principal | ICD-10-CM

## 2015-03-14 LAB — CBC
HCT: 40.6 % (ref 39.0–52.0)
Hemoglobin: 13.8 g/dL (ref 13.0–17.0)
MCH: 36.1 pg — ABNORMAL HIGH (ref 26.0–34.0)
MCHC: 34 g/dL (ref 30.0–36.0)
MCV: 106.3 fL — ABNORMAL HIGH (ref 78.0–100.0)
PLATELETS: 197 10*3/uL (ref 150–400)
RBC: 3.82 MIL/uL — AB (ref 4.22–5.81)
RDW: 12.6 % (ref 11.5–15.5)
WBC: 6.2 10*3/uL (ref 4.0–10.5)

## 2015-03-14 LAB — COMPREHENSIVE METABOLIC PANEL
ALBUMIN: 3.5 g/dL (ref 3.5–5.0)
ALT: 39 U/L (ref 17–63)
AST: 43 U/L — AB (ref 15–41)
Alkaline Phosphatase: 59 U/L (ref 38–126)
Anion gap: 8 (ref 5–15)
BUN: 5 mg/dL — AB (ref 6–20)
CHLORIDE: 104 mmol/L (ref 101–111)
CO2: 29 mmol/L (ref 22–32)
CREATININE: 0.81 mg/dL (ref 0.61–1.24)
Calcium: 9.1 mg/dL (ref 8.9–10.3)
GFR calc Af Amer: >60 mL/min (ref >60)
GFR calc non Af Amer: >60 mL/min (ref >60)
Glucose, Bld: 109 mg/dL — ABNORMAL HIGH (ref 65–99)
Potassium: 3.5 mmol/L (ref 3.5–5.1)
SODIUM: 141 mmol/L (ref 135–145)
Total Bilirubin: 0.9 mg/dL (ref 0.3–1.2)
Total Protein: 6.7 g/dL (ref 6.5–8.1)

## 2015-03-14 MED ORDER — CLONAZEPAM 0.5 MG PO TABS: 0.5000 mg | ORAL_TABLET | Freq: Two times a day (BID) | ORAL | Status: DC | PRN

## 2015-03-14 MED ORDER — ZOLPIDEM TARTRATE 5 MG PO TABS: 5.0000 mg | ORAL_TABLET | Freq: Every evening | ORAL | Status: DC | PRN

## 2015-03-14 MED ORDER — PAROXETINE HCL 40 MG PO TABS: 40.0000 mg | ORAL_TABLET | Freq: Every day | ORAL | Status: DC

## 2015-03-14 NOTE — Clinical Social Work Note (Addendum)
10:00am- CSW faxed pt information to [facility]- admissions [phone] in attempt to place pt. Per admissions department, they are holding a bed for the pt. CSW will continue to follow.   11:25am- Pt was accepted by Do[facility]andra at [facility]. Admission time is scheduled for 12:30pm. Pt's daughter is on her way from [place], [place] to provide transportation. ETA is noon.     Etta QuillSarah Gonzalez-[place], LCSW [phone] Hospital psychiatric & 5E, 5W 31-35 Licensed Clinical Social Worker

## 2015-03-14 NOTE — Discharge Instructions (Signed)
Alcohol Withdrawal  When a person who drinks a lot of alcohol stops drinking, he or she may go through alcohol withdrawal. Alcohol withdrawal causes problems. It can make you feel:  · Tired (fatigued).  · Sad (depressed).  · Fearful (anxious).  · Grouchy (irritable).  · Not hungry.  · Sick to your stomach (nauseous).  · Shaky.  It can also make you have:  · Nightmares.  · Trouble sleeping.  · Trouble thinking clearly.  · Mood swings.  · Clammy skin.  · Very bad sweating.  · A very fast heartbeat.  · Shaking that you cannot control (tremor).  · Having a fever.  · A fit of movements that you cannot control (seizure).  · Confusion.  · Throwing up (vomiting).  · Feeling or seeing things that are not there (hallucinations).  HOME CARE  · Take medicines and vitamins only as told by your doctor.  · Do not drink alcohol.  · Have someone around in case you need help.  · Drink enough fluid to keep your pee (urine) clear or pale yellow.  · Think about joining a group to help you stop drinking.  GET HELP IF:  · Your problems get worse.  · Your problems do not go away.  · You cannot eat or drink without throwing up.  · You are having a hard time not drinking alcohol.  · You cannot stop drinking alcohol.  GET HELP RIGHT AWAY IF:  · You feel your heart beating differently than usual.  · Your chest hurts.  · You have trouble breathing.  · You have very bad problems, like:    A fever.    A fit of movements that you cannot control.    Being very confused.    Feeling or seeing things that are not there.     This information is not intended to replace advice given to you by your health care provider. Make sure you discuss any questions you have with your health care provider.     Document Released: 08/08/2007 Document Revised: 03/12/2014 Document Reviewed: 12/08/2013  Elsevier Interactive Patient Education ©2016 Elsevier Inc.

## 2015-03-14 NOTE — Clinical Social Work Note (Signed)
CSW met with pt and daughter at bedside. Pt has changed his mind about [facility]; he no longer wants to go. Pt states he wants to go to [facility] in [place], [place]. Pt states he has been there before and spoke to the director today who states he can come tomorrow. Pt has requested the CSW fax over his d/c summary to [facility] for staff to review.   CSW explains the risk of going home instead of going directly to [facility]. Pt was polite, but refuses to go to [facility] for treatment. He will stay with his daughter and wife until going to [place] tomorrow morning. CSW explains that he hasn't been officially accepted at [facility]; pt states he understands this risk. CSW agrees to fax d/c summary today.    Gonzalez-Graham, LCSW [phone] Hospital psychiatric & 5E, 5W 31-35 Licensed Clinical Social Worker   

## 2015-03-14 NOTE — Discharge Summary (Addendum)
Physician Discharge Summary  Devin Bryant ZOX:[id number] DOB: 1956-01-10 DOA: 03/11/2015  PCP: Baruch Gouty, MD  Admit date: 03/11/2015 Discharge date: 03/14/2015  Recommendations for Outpatient Follow-up:  1. Pt will need to follow up with PCP in 2-3 weeks post discharge 2. Please obtain BMP to evaluate electrolytes and kidney function, liver function tests   Discharge Diagnoses:  Principal Problem:   Alcohol withdrawal (HCC) Active Problems:   Tobacco abuse   COPD (chronic obstructive pulmonary disease) (HCC)  Discharge Condition: Stable  Diet recommendation: Heart healthy diet discussed in details    Brief narrative:    60 year old male with known alcohol use, COPD, presented earlier in the day to [facility] but was referred to the ED due significant withdrawal with nausea and vomiting, tremors. Pt reports he typically drinks about 2 bottles of wine and up to one fifth of alcohol a day. States that his last drink was at around 3 AM today. This morning has also developed nausea and vomiting, tremors, and anxiety. Denies any seizures, hallucinations, suicidal or homicidal ideation.   In ED, pt with tremors but hemodynamically stable, VSS except tachycardia up to 127. Blood work unremarkable. TRH asked to admit to telemetry unit for further evaluation.   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  Family Communication: plan of care discussed with the  patient Disposition Plan: Home vs [facility]   IV access:  Peripheral IV  Procedures and diagnostic studies:    Imaging Results    No results found.    Medical Consultants:  None   Other Consultants:  None  IAnti-Infectives:   None     Discharge Exam: Filed Vitals:   03/13/15 2127 03/14/15 0605  BP: 158/91 159/99  Pulse: 102 84  Temp: 98.3 F (36.8 C) 98 F (36.7 C)  Resp: 18 19   Filed Vitals:   03/13/15 1432 03/13/15 2127 03/14/15 0605 03/14/15 0608  BP: 157/87 158/91 159/99   Pulse: 103 102 84   Temp: 97.9 F (36.6 C) 98.3 F (36.8 C) 98 F (36.7 C)   TempSrc: Oral Oral Oral   Resp: 18 18 19    Height:      Weight:      SpO2: 97% 96% 96% 96%    General: Pt is alert, follows commands appropriately, not in acute distress Cardiovascular: Regular rate and rhythm, S1/S2 +, no murmurs, no rubs, no gallops Respiratory: Clear to auscultation bilaterally, no wheezing, no crackles, no rhonchi Abdominal: Soft, non tender, non distended, bowel sounds +, no guarding Extremities: no edema, no cyanosis, pulses palpable bilaterally DP and PT Neuro: Grossly nonfocal  Discharge Instructions  Discharge Instructions    Diet - low sodium heart healthy    Complete by:  As directed      Increase activity slowly    Complete by:  As directed             Medication List    STOP taking these medications  azithromycin 250 MG tablet  Commonly known as:  ZITHROMAX     chlordiazePOXIDE 25 MG capsule  Commonly known as:  LIBRIUM     fluconazole 200 MG tablet  Commonly known as:  DIFLUCAN     HYDROcodone-homatropine 5-1.5 MG/5ML syrup  Commonly known as:  HYCODAN     predniSONE 20 MG tablet  Commonly known as:  DELTASONE      TAKE these medications        ADVAIR HFA 115-21 MCG/ACT inhaler  Generic drug:  fluticasone-salmeterol  Inhale 2 puffs into the lungs 2 (two) times daily.     albuterol 108 (90 Base) MCG/ACT inhaler  Commonly  known as:  PROVENTIL HFA;VENTOLIN HFA  Inhale 2 puffs into the lungs every 6 (six) hours as needed for wheezing or shortness of breath.     albuterol (2.5 MG/3ML) 0.083% nebulizer solution  Commonly known as:  PROVENTIL  Take 3 mLs (2.5 mg total) by nebulization every 4 (four) hours as needed for wheezing or shortness of breath.     aspirin EC 81 MG tablet  Take 81 mg by mouth daily.     clonazePAM 0.5 MG tablet  Commonly known as:  KLONOPIN  Take 1 tablet (0.5 mg total) by mouth 2 (two) times daily as needed for anxiety.     CORDRAN 0.05 % lotion  Generic drug:  flurandrenolide  Apply 1 application topically 2 (two) times daily. Apply to face     esomeprazole 20 MG capsule  Commonly known as:  NEXIUM  Take 20 mg by mouth daily at 12 noon.     fluticasone 50 MCG/ACT nasal spray  Commonly known as:  FLONASE  Place 1 spray into both nostrils daily.     mometasone 0.1 % ointment  Commonly known as:  ELOCON  Apply 1 application topically daily as needed (for ear irritation/eczema in ear). Apply to each ear 1 drop daily as needed     multivitamin tablet  Take 1 tablet by mouth daily.     nystatin 100000 UNIT/ML suspension  Commonly known as:  MYCOSTATIN  Take 5 mLs by mouth 4 (four) times daily as needed. thrush     PARoxetine 40 MG tablet  Commonly known as:  PAXIL  Take 1 tablet (40 mg total) by mouth daily.     theophylline 300 MG 12 hr tablet  Commonly known as:  THEODUR  Take 300 mg by mouth 2 (two) times daily.     tiotropium 18 MCG inhalation capsule  Commonly known as:  SPIRIVA HANDIHALER  Place 1 capsule (18 mcg total) into inhaler and inhale daily.     TOPICORT SPRAY EX  Apply 1 application topically 2 (two) times daily as needed (scorisis/irritation over body. Use all over except for on face.). Reported on 03/11/2015     Umeclidinium Bromide 62.5 MCG/INH Aepb  Commonly known as:  INCRUSE ELLIPTA  Inhale 1 Inhaler into the lungs daily.     zolpidem 5 MG  tablet  Commonly known as:  AMBIEN  Take 1 tablet (5 mg total) by mouth at bedtime as needed for sleep.     ZYRTEC ALLERGY 10 MG Caps  Generic drug:  Cetirizine HCl  Take 10 mg by mouth daily.            Follow-up Information    Follow up with Baruch GoutyMelinda Lada, MD.   Specialty:  Family Medicine   Contact information:   [address] [place] [place] [address] [phone]  Call Debbora Presto, MD.   Specialty:  Internal Medicine   Why:  As needed call my cell phone [phone]   Contact information:   [address] [place] [address] [phone]        The results of significant diagnostics from this hospitalization (including imaging, microbiology, ancillary and laboratory) are listed below for reference.     Microbiology: No results found for this or any previous visit (from the past 240 hour(s)).   Labs: Basic Metabolic Panel:  Recent Labs Lab 03/11/15 1234 03/12/15 0519 03/13/15 0522 03/14/15 0548  NA 138 141 142 141  K 4.1 4.7 3.5 3.5  CL 100* 105 102 104  CO2 25 30 29 29   GLUCOSE 149* 100* 105* 109*  BUN 7 6 5* 5*  CREATININE 0.82 0.81 0.79 0.81  CALCIUM 8.9 8.4* 8.8* 9.1  MG 1.8  --   --   --   PHOS 1.6*  --  4.1  --    Liver Function Tests:  Recent Labs Lab 03/11/15 1234 03/13/15 0522 03/14/15 0548  AST 55* 43* 43*  ALT 54 41 39  ALKPHOS 74 62 59  BILITOT 0.7 0.6 0.9  PROT 7.7 6.4* 6.7  ALBUMIN 3.9 3.3* 3.5   CBC:  Recent Labs Lab 03/11/15 1234 03/12/15 0519 03/13/15 0522 03/14/15 0548  WBC 10.4 6.6 6.4 6.2  NEUTROABS 9.2*  --   --   --   HGB 14.8 13.3 13.9 13.8  HCT 43.7 40.7 41.9 40.6  MCV 108.2* 110.6* 108.5* 106.3*  PLT 242 217 207 197    SIGNED: Time coordinating discharge: 30 minutes  MAGICK-MYERS, ISKRA, MD  [facility] 03/14/2015, 9:08 AM Pager [phone]  If 7PM-7AM, please contact night-coverage [email or web address] Password [email or web address]

## 2015-03-17 ENCOUNTER — Telehealth: Payer: Self-pay | Admitting: Family Medicine

## 2015-03-17 NOTE — Telephone Encounter (Signed)
This was not a phone encounter, but wanted this communication documented in the MEDICAL RECORD NUMBER Patient Demographics     Patient Name Sex DOB SSN Address Phone    Devin Bryant, Devin Bryant Male November 28, 1955 [id number] [address] [place] [id number] [phone] [facility](Home) [phone] (Mobile)      RE: Thank you so much for caring for my patient  Received: 5 days ago    Dorothea OgleIskra M Myers, MD  Kerman PasseyMelinda P Lada, MD           Thank you for letting me know.  iskra myers       Previous Messages     ----- Message -----   From: Kerman PasseyMelinda P Lada, MD   Sent: 03/11/2015  9:29 PM    To: Dorothea OgleIskra M Myers, MD  Subject: Thank you so much for caring for my patient    Greetings! Thank you for caring for Mr. Ludwig LeanLaughlin. I see that he is getting help for alcoholism. I have not seen him since August, so I'll be looking forward to helping him in the office when he is released.   If you would, please remove the clonazepam from his current med list. I have not prescribed that for him since August, and I will not be refilling that, obviously, when he is discharged. I just don't want him to be continued on that when he leaves with anyone expecting me to pick that back up. If he does need some sort of benzo taper because of alcohol detox, please have psychiatry manage that.   I'll be back in the office on Monday, reachable at [phone], or feel free to write me back.   [place],  [place]

## 2015-04-04 ENCOUNTER — Encounter: Payer: Self-pay | Admitting: Family Medicine

## 2015-04-04 ENCOUNTER — Ambulatory Visit (INDEPENDENT_AMBULATORY_CARE_PROVIDER_SITE_OTHER): Payer: BLUE CROSS/BLUE SHIELD | Admitting: Family Medicine

## 2015-04-04 VITALS — BP 143/79 | HR 104 | Temp 97.8°F | Ht 70.0 in | Wt 176.0 lb

## 2015-04-04 DIAGNOSIS — F411 Generalized anxiety disorder: Secondary | ICD-10-CM

## 2015-04-04 DIAGNOSIS — D7589 Other specified diseases of blood and blood-forming organs: Secondary | ICD-10-CM

## 2015-04-04 DIAGNOSIS — Z23 Encounter for immunization: Secondary | ICD-10-CM

## 2015-04-04 DIAGNOSIS — F1020 Alcohol dependence, uncomplicated: Secondary | ICD-10-CM

## 2015-04-04 DIAGNOSIS — F1021 Alcohol dependence, in remission: Secondary | ICD-10-CM

## 2015-04-04 NOTE — Progress Notes (Signed)
BP 143/79 mmHg  Pulse 104  Temp(Src) 97.8 F (36.6 C)  Ht 5\' 10"  (1.778 m)  Wt 176 lb (79.833 kg)  BMI 25.25 kg/m2  SpO2 98%   Subjective:    Patient ID: Devin Bryant, male    DOB: [birth year], 60 y.o.   MRN: [id number]  HPI: Devin Bryant is a 60 y.o. male  Chief Complaint  Patient presents with  . Alcohol Problem    he states he just went thru rehab, but still drinking every day  . URI    just started a couple days ago, he has COPD  . Referral    he'd like to be referred to a psychiatrist with knowledge in addiction  . Immunizations    he'd like get a teatnus shot today   He has been having cold symptoms; little stiffness in the joints, no bad body aches; nasal congestion; no night sweats or fever at home   He was at [facility] for alcoholism, and then went to [place], [place] and could only stay for 10 days; he says he has had one slip; that occurred when he left [facility] while waiting to get into [place]; he is dealing with issues; sleeping like a baby on the zolpidem; he does not have a psychiatrist now, was getting medicine from psych at [facility]; he has huge issue with anxiety; still getting klonopin and ambien from doctor at [facility]; at [facility], they added gabepentin; he is changing insurances Wednesday  He got the clonazepam and ambien from the [facility] system doctor; I explained that I had written to her and did not want to continue him on any controlled substance and that would have to come through psychiatrist after discharge Ambien is from Dr. Danie Binder, filled 03/14/15, 30 pills 8 pills remaining, long oblong red pills Clonazepam 0.5 mg from Dr. Sherlon Handing filled 03/14/15, 60 pills with 16.5 pills remaining Gabapentin 300 mg BID now, filled 03/21/14, 60 caps  He has had just one slip in the last 30 days; March 14, 2015 He got very depressed with the diagnosis of COPD, got depressed with that dx She increased the paxil to 40 mg and that  is working well he says  He is aware of risk of overdose, unintentional fatal overdose if mixing ambien plus clonazepam with alcohol  Depression screen [facility] 2/9 04/04/2015  Decreased Interest 2  Down, Depressed, Hopeless 1  PHQ - 2 Score 3  Altered sleeping 0  Tired, decreased energy 0  Change in appetite 0  Feeling bad or failure about yourself  1  Trouble concentrating 0  Moving slowly or fidgety/restless 0  Suicidal thoughts 0  PHQ-9 Score 4  Difficult doing work/chores Not difficult at all   GAD 7 : Generalized Anxiety Score 04/04/2015  Nervous, Anxious, on Edge 2  Control/stop worrying 2  Worry too much - different things 2  Trouble relaxing 2  Restless 1  Easily annoyed or irritable 1  Afraid - awful might happen 1  Total GAD 7 Score 11  Anxiety Difficulty Somewhat difficult   Relevant past medical, surgical, family and social history reviewed and updated as indicated Interim medical history since our last visit reviewed.  Allergies and medications reviewed and updated.  Review of Systems Per HPI unless specifically indicated above     Objective:    BP 143/79 mmHg  Pulse 104  Temp(Src) 97.8 F (36.6 C)  Ht 5\' 10"  (1.778 m)  Wt 176 lb (79.833 kg)  BMI  25.25 kg/m2  SpO2 98%  Wt Readings from Last 3 Encounters:  04/04/15 176 lb (79.833 kg)  03/12/15 176 lb 5.9 oz (80 kg)  02/20/15 180 lb (81.647 kg)    Physical Exam  Constitutional: He appears well-developed and well-nourished.  Eyes: EOM are normal. No scleral icterus.  Neck: No JVD present.  Cardiovascular: Regular rhythm.  Tachycardia present.   Pulmonary/Chest: Effort normal and breath sounds normal.  Abdominal: He exhibits no distension.  Musculoskeletal: He exhibits no edema.  Neurological: He is alert.  Skin: Skin is warm.  Psychiatric: His mood appears anxious. His speech is not delayed and not slurred. He is not agitated, not hyperactive, not slowed and not withdrawn. Cognition and memory are  normal. He does not exhibit a depressed mood.    Results for orders placed or performed during the hospital encounter of 03/11/15  CBC with Differential  Result Value Ref Range   WBC 10.4 4.0 - 10.5 K/uL   RBC 4.04 (L) 4.22 - 5.81 MIL/uL   Hemoglobin 14.8 13.0 - 17.0 g/dL   HCT 16.1 09.6 - 04.5 %   MCV 108.2 (H) 78.0 - 100.0 fL   MCH 36.6 (H) 26.0 - 34.0 pg   MCHC 33.9 30.0 - 36.0 g/dL   RDW 40.9 81.1 - 91.4 %   Platelets 242 150 - 400 K/uL   Neutrophils Relative % 89 %   Neutro Abs 9.2 (H) 1.7 - 7.7 K/uL   Lymphocytes Relative 6 %   Lymphs Abs 0.6 (L) 0.7 - 4.0 K/uL   Monocytes Relative 4 %   Monocytes Absolute 0.5 0.1 - 1.0 K/uL   Eosinophils Relative 0 %   Eosinophils Absolute 0.0 0.0 - 0.7 K/uL   Basophils Relative 1 %   Basophils Absolute 0.1 0.0 - 0.1 K/uL  Comprehensive metabolic panel  Result Value Ref Range   Sodium 138 135 - 145 mmol/L   Potassium 4.1 3.5 - 5.1 mmol/L   Chloride 100 (L) 101 - 111 mmol/L   CO2 25 22 - 32 mmol/L   Glucose, Bld 149 (H) 65 - 99 mg/dL   BUN 7 6 - 20 mg/dL   Creatinine, Ser 7.82 0.61 - 1.24 mg/dL   Calcium 8.9 8.9 - 95.6 mg/dL   Total Protein 7.7 6.5 - 8.1 g/dL   Albumin 3.9 3.5 - 5.0 g/dL   AST 55 (H) 15 - 41 U/L   ALT 54 17 - 63 U/L   Alkaline Phosphatase 74 38 - 126 U/L   Total Bilirubin 0.7 0.3 - 1.2 mg/dL   GFR calc non Af Amer >60 >60 mL/min   GFR calc Af Amer >60 >60 mL/min   Anion gap 13 5 - 15  Magnesium  Result Value Ref Range   Magnesium 1.8 1.7 - 2.4 mg/dL  Phosphorus  Result Value Ref Range   Phosphorus 1.6 (L) 2.5 - 4.6 mg/dL  Ethanol  Result Value Ref Range   Alcohol, Ethyl (B) <5 <5 mg/dL  Basic metabolic panel  Result Value Ref Range   Sodium 141 135 - 145 mmol/L   Potassium 4.7 3.5 - 5.1 mmol/L   Chloride 105 101 - 111 mmol/L   CO2 30 22 - 32 mmol/L   Glucose, Bld 100 (H) 65 - 99 mg/dL   BUN 6 6 - 20 mg/dL   Creatinine, Ser 2.13 0.61 - 1.24 mg/dL   Calcium 8.4 (L) 8.9 - 10.3 mg/dL   GFR calc non Af  Amer >60 >60 mL/min  GFR calc Af Amer >60 >60 mL/min   Anion gap 6 5 - 15  CBC  Result Value Ref Range   WBC 6.6 4.0 - 10.5 K/uL   RBC 3.68 (L) 4.22 - 5.81 MIL/uL   Hemoglobin 13.3 13.0 - 17.0 g/dL   HCT 16.1 09.6 - 04.5 %   MCV 110.6 (H) 78.0 - 100.0 fL   MCH 36.1 (H) 26.0 - 34.0 pg   MCHC 32.7 30.0 - 36.0 g/dL   RDW 40.9 81.1 - 91.4 %   Platelets 217 150 - 400 K/uL  Comprehensive metabolic panel  Result Value Ref Range   Sodium 142 135 - 145 mmol/L   Potassium 3.5 3.5 - 5.1 mmol/L   Chloride 102 101 - 111 mmol/L   CO2 29 22 - 32 mmol/L   Glucose, Bld 105 (H) 65 - 99 mg/dL   BUN 5 (L) 6 - 20 mg/dL   Creatinine, Ser 7.82 0.61 - 1.24 mg/dL   Calcium 8.8 (L) 8.9 - 10.3 mg/dL   Total Protein 6.4 (L) 6.5 - 8.1 g/dL   Albumin 3.3 (L) 3.5 - 5.0 g/dL   AST 43 (H) 15 - 41 U/L   ALT 41 17 - 63 U/L   Alkaline Phosphatase 62 38 - 126 U/L   Total Bilirubin 0.6 0.3 - 1.2 mg/dL   GFR calc non Af Amer >60 >60 mL/min   GFR calc Af Amer >60 >60 mL/min   Anion gap 11 5 - 15  CBC  Result Value Ref Range   WBC 6.4 4.0 - 10.5 K/uL   RBC 3.86 (L) 4.22 - 5.81 MIL/uL   Hemoglobin 13.9 13.0 - 17.0 g/dL   HCT 95.6 21.3 - 08.6 %   MCV 108.5 (H) 78.0 - 100.0 fL   MCH 36.0 (H) 26.0 - 34.0 pg   MCHC 33.2 30.0 - 36.0 g/dL   RDW 57.8 46.9 - 62.9 %   Platelets 207 150 - 400 K/uL  Phosphorus  Result Value Ref Range   Phosphorus 4.1 2.5 - 4.6 mg/dL  CBC  Result Value Ref Range   WBC 6.2 4.0 - 10.5 K/uL   RBC 3.82 (L) 4.22 - 5.81 MIL/uL   Hemoglobin 13.8 13.0 - 17.0 g/dL   HCT 52.8 41.3 - 24.4 %   MCV 106.3 (H) 78.0 - 100.0 fL   MCH 36.1 (H) 26.0 - 34.0 pg   MCHC 34.0 30.0 - 36.0 g/dL   RDW 01.0 27.2 - 53.6 %   Platelets 197 150 - 400 K/uL  Comprehensive metabolic panel  Result Value Ref Range   Sodium 141 135 - 145 mmol/L   Potassium 3.5 3.5 - 5.1 mmol/L   Chloride 104 101 - 111 mmol/L   CO2 29 22 - 32 mmol/L   Glucose, Bld 109 (H) 65 - 99 mg/dL   BUN 5 (L) 6 - 20 mg/dL   Creatinine,  Ser 6.44 0.61 - 1.24 mg/dL   Calcium 9.1 8.9 - 03.4 mg/dL   Total Protein 6.7 6.5 - 8.1 g/dL   Albumin 3.5 3.5 - 5.0 g/dL   AST 43 (H) 15 - 41 U/L   ALT 39 17 - 63 U/L   Alkaline Phosphatase 59 38 - 126 U/L   Total Bilirubin 0.9 0.3 - 1.2 mg/dL   GFR calc non Af Amer >60 >60 mL/min   GFR calc Af Amer >60 >60 mL/min   Anion gap 8 5 - 15      Assessment & Plan:  Problem List Items Addressed This Visit      Other   Anxiety disorder    Continue 40 mg Paxil; patient was discharged from [facility] on a benzodiazepine that I had no intention of continuing as an outpatient and I communicated that to the hospitalist caring for him while he was hospitalized; I need to get him in to an addiction specialist; I want to talk with the doctor who cared for him at [facility] to see if continuing two different controlled substances were indeed part of the plan, and did he receive those during his stay there; discussed risk of fatal, unintentional overdose if he drinks alcohol and takes ambien plus/minus clonazepam; even mixing the ambien plus clonazepam can result in fatal OD; patient has enough medicine to last until next week; in the meantime, we will try to get him in to an addiction specialist and communicate with his psych at [facility]      Alcoholism in recovery [facility]) - Primary    Sobriety date of January 9th per patient; he needs to get in to work with an addiction/recovery specialist; I am reluctant to continue any controlled substances, and I showed him the communication I had with his hospitalist; I need to talk to the psychiatrist who treated him in [place] to see if continuing the [place] and clonazepam were supposed to be part of his treatment plan; I am putting in a high level referral to an addiction psychiatrist      Relevant Orders   Ambulatory referral to Psychiatry   Macrocytosis without anemia    Numbers already starting to improve in the hospital; reviewed with him, explained  toxicity of the alcohol       Other Visit Diagnoses    Need for Tdap vaccination        offered, given today    Relevant Orders    Tdap vaccine greater than or equal to 7yo IM (Completed)        Follow up plan: Return in about 7 days (around 04/11/2015) for follow-up.  An after-visit summary was printed and given to the patient at check-out.  Please see the patient instructions which may contain other information and recommendations beyond what is mentioned above in the assessment and plan.

## 2015-04-04 NOTE — Patient Instructions (Addendum)
I need permission to speak with treating addiction specialist/counselor at [facility] about medicine and plans at discharge please, Devin Bryant, so please get release; I JUST want permission to speak with psychiatrist/counselor about discharge plans and medicines and to get a discharge summary, discharge instructions  I have put in an urgent referral to an addiction specialist / psychiatrist here  I will see you back in one week for reassessment if you have not been seen by psychiatrist to take over your medicines You have 8 days of medicines left in your bottles, so you will not run out of this medicine before your next appointment  I encourage you to quit drinking, and I am here to help you There is a walk-in alcohol counselor who can meet with you right now; his name is Rob and he works at [employer] Address: [address], [place], [place] [address]  Phone: [phone]  [facility] AA Meeting List  Including [place] and [place]  HOTLINE: [phone]  [facility] in [place] takes private insurance and you can walk-in there today or any time Address: [address] [place], [place] [address]  Phone: [phone]   Alcohol Abuse and Nutrition Alcohol abuse is any pattern of alcohol consumption that harms your health, relationships, or work. Alcohol abuse can affect how your body breaks down and absorbs nutrients from food by causing your liver to work abnormally. Additionally, many people who abuse alcohol do not eat enough carbohydrates, protein, fat, vitamins, and minerals. This can cause poor nutrition (malnutrition) and a lack of nutrients (nutrient deficiencies), which can lead to further complications. Nutrients that are commonly lacking (deficient) among people who abuse alcohol include:  Vitamins.  Vitamin A. This is stored in your liver. It is important for your vision, metabolism, and ability to fight off infections (immunity).  B  vitamins. These include vitamins such as folate, thiamin, and niacin. These are important in new cell growth and maintenance.  Vitamin C. This plays an important role in iron absorption, wound healing, and immunity.  Vitamin D. This is produced by your liver, but you can also get vitamin D from food. Vitamin D is necessary for your body to absorb and use calcium.  Minerals.  Calcium. This is important for your bones and your heart and blood vessel (cardiovascular) function.  Iron. This is important for blood, muscle, and nervous system functioning.  Magnesium. This plays an important role in muscle and nerve function, and it helps to control blood sugar and blood pressure.  Zinc. This is important for the normal function of your nervous system and digestive system (gastrointestinal tract). Nutrition is an essential component of therapy for alcohol abuse. Your health care provider or dietitian will work with you to design a plan that can help restore nutrients to your body and prevent potential complications. WHAT IS MY PLAN? Your dietitian may develop a specific diet plan that is based on your condition and any other complications you may have. A diet plan will commonly include:  A balanced diet.  Grains: 6-8 oz per day.  Vegetables: 2-3 cups per day.  Fruits: 1-2 cups per day.  Meat and other protein: 5-6 oz per day.  Dairy: 2-3 cups per day.  Vitamin and mineral supplements. WHAT DO I NEED TO KNOW ABOUT ALCOHOL AND NUTRITION?  Consume foods that are high in antioxidants, such as grapes, berries, nuts, green tea, and dark green and orange vegetables. This can help to counteract some of  the stress that is placed on your liver by consuming alcohol.  Avoid food and drinks that are high in fat and sugar. Foods such as sugared soft drinks, salty snack foods, and candy contain empty calories. This means that they lack important nutrients such as protein, fiber, and vitamins.  Eat  frequent meals and snacks. Try to eat 5-6 small meals each day.  Eat a variety of fresh fruits and vegetables each day. This will help you get plenty of water, fiber, and vitamins in your diet.  Drink plenty of water and other clear fluids. Try to drink at least 48-64 oz (1.5-2 L) of water per day.  If you are a vegetarian, eat a variety of protein-rich foods. Pair whole grains with plant-based proteins at meals and snacks to obtain the greatest nutrient benefit from your food. For example, eat rice with beans, put peanut butter on whole-grain toast, or eat oatmeal with sunflower seeds.  Soak beans and whole grains overnight before cooking. This can help your body to absorb the nutrients more easily.  Include foods fortified with vitamins and minerals in your diet. Commonly fortified foods include milk, orange juice, cereal, and bread.  If you are malnourished, your dietitian may recommend a high-protein, high-calorie diet. This may include:  2,000-3,000 calories (kilocalories) per day.  70-100 grams of protein per day.  Your health care provider may recommend a complete nutritional supplement beverage. This can help to restore calories, protein, and vitamins to your body. Depending on your condition, you may be advised to consume this instead of or in addition to meals.  Limit your intake of caffeine. Replace drinks like coffee and black tea with decaffeinated coffee and herbal tea.  Eat a variety of foods that are high in omega fatty acids. These include fish, nuts and seeds, and soybeans. These foods may help your liver to recover and may also stabilize your mood.  Certain medicines may cause changes in your appetite, taste, and weight. Work with your health care provider and dietitian to make any adjustments to your medicines and diet plan.  Include other healthy lifestyle choices in your daily routine.  Be physically active.  Get enough sleep.  Spend time doing activities that  you enjoy.  If you are unable to take in enough food and calories by mouth, your health care provider may recommend a feeding tube. This is a tube that passes through your nose and throat, directly into your stomach. Nutritional supplement beverages can be given to you through the feeding tube to help you get the nutrients you need.  Take vitamin or mineral supplements as recommended by your health care provider. WHAT FOODS CAN I EAT? Grains Enriched pasta. Enriched rice. Fortified whole-grain bread. Fortified whole-grain cereal. Barley. Brown rice. Quinoa. Millet. Vegetables All fresh, frozen, and canned vegetables. Spinach. Kale. Artichoke. Carrots. Winter squash and pumpkin. Sweet potatoes. Broccoli. Cabbage. Cucumbers. Tomatoes. Sweet peppers. Green beans. Peas. Corn. Fruits All fresh and frozen fruits. Berries. Grapes. Mango. Papaya. Guava. Cherries. Apples. Bananas. Peaches. Plums. Pineapple. Watermelon. Cantaloupe. Oranges. Avocado. Meats and Other Protein Sources Beef liver. Lean beef. Pork. Fresh and canned chicken. Fresh fish. Oysters. Sardines. Canned tuna. Shrimp. Eggs with yolks. Nuts and seeds. Peanut butter. Beans and lentils. Soybeans. Tofu. Dairy Whole, low-fat, and nonfat milk. Whole, low-fat, and nonfat yogurt. Cottage cheese. Sour cream. Hard and soft cheeses. Beverages Water. Herbal tea. Decaffeinated coffee. Decaffeinated green tea. 100% fruit juice. 100% vegetable juice. Instant breakfast shakes. Condiments Ketchup. Mayonnaise. Mustard. Salad  dressing. Barbecue sauce. Sweets and Desserts Sugar-free ice cream. Sugar-free pudding. Sugar-free gelatin. Fats and Oils Butter. Vegetable oil, flaxseed oil, olive oil, and walnut oil. Other Complete nutrition shakes. Protein bars. Sugar-free gum. The items listed above may not be a complete list of recommended foods or beverages. Contact your dietitian for more options. WHAT FOODS ARE NOT RECOMMENDED? Grains Sugar-sweetened  breakfast cereals. Flavored instant oatmeal. Fried breads. Vegetables Breaded or deep-fried vegetables. Fruits Dried fruit with added sugar. Candied fruit. Canned fruit in syrup. Meats and Other Protein Sources Breaded or deep-fried meats. Dairy Flavored milks. Fried cheese curds or fried cheese sticks. Beverages Alcohol. Sugar-sweetened soft drinks. Sugar-sweetened tea. Caffeinated coffee and tea. Condiments Sugar. Honey. Agave nectar. Molasses. Sweets and Desserts Chocolate. Cake. Cookies. Candy. Other Potato chips. Pretzels. Salted nuts. Candied nuts. The items listed above may not be a complete list of foods and beverages to avoid. Contact your dietitian for more information.   This information is not intended to replace advice given to you by your health care provider. Make sure you discuss any questions you have with your health care provider.   Document Released: 12/14/2004 Document Revised: 03/12/2014 Document Reviewed: 09/22/2013 Elsevier Interactive Patient Education 2016 Elsevier Inc. Generalized Anxiety Disorder Generalized anxiety disorder (GAD) is a mental disorder. It interferes with life functions, including relationships, work, and school. GAD is different from normal anxiety, which everyone experiences at some point in their lives in response to specific life events and activities. Normal anxiety actually helps [place] prepare for and get through these life events and activities. Normal anxiety goes away after the event or activity is over.  GAD causes anxiety that is not necessarily related to specific events or activities. It also causes excess anxiety in proportion to specific events or activities. The anxiety associated with GAD is also difficult to control. GAD can vary from mild to severe. People with severe GAD can have intense waves of anxiety with physical symptoms (panic attacks).  SYMPTOMS The anxiety and worry associated with GAD are difficult to control. This  anxiety and worry are related to many life events and activities and also occur more days than not for 6 months or longer. People with GAD also have three or more of the following symptoms (one or more in children):  Restlessness.   Fatigue.  Difficulty concentrating.   Irritability.  Muscle tension.  Difficulty sleeping or unsatisfying sleep. DIAGNOSIS GAD is diagnosed through an assessment by your health care provider. Your health care provider will ask you questions aboutyour mood,physical symptoms, and events in your life. Your health care provider may ask you about your medical history and use of alcohol or drugs, including prescription medicines. Your health care provider may also do a physical exam and blood tests. Certain medical conditions and the use of certain substances can cause symptoms similar to those associated with GAD. Your health care provider may refer you to a mental health specialist for further evaluation. TREATMENT The following therapies are usually used to treat GAD:   Medication. Antidepressant medication usually is prescribed for long-term daily control. Antianxiety medicines may be added in severe cases, especially when panic attacks occur.   Talk therapy (psychotherapy). Certain types of talk therapy can be helpful in treating GAD by providing support, education, and guidance. A form of talk therapy called cognitive behavioral therapy can teach you healthy ways to think about and react to daily life events and activities.  Stress managementtechniques. These include yoga, meditation, and exercise and can be  very helpful when they are practiced regularly. A mental health specialist can help determine which treatment is best for you. Some people see improvement with one therapy. However, other people require a combination of therapies.   This information is not intended to replace advice given to you by your health care provider. Make sure you discuss any  questions you have with your health care provider.   Document Released: 06/16/2012 Document Revised: 03/12/2014 Document Reviewed: 06/16/2012 Elsevier Interactive Patient Education [employer]

## 2015-04-11 ENCOUNTER — Telehealth: Payer: Self-pay | Admitting: Family Medicine

## 2015-04-11 ENCOUNTER — Encounter: Payer: Self-pay | Admitting: Family Medicine

## 2015-04-11 ENCOUNTER — Ambulatory Visit (INDEPENDENT_AMBULATORY_CARE_PROVIDER_SITE_OTHER): Payer: BLUE CROSS/BLUE SHIELD | Admitting: Family Medicine

## 2015-04-11 VITALS — BP 123/79 | HR 116 | Temp 97.5°F | Wt 176.0 lb

## 2015-04-11 DIAGNOSIS — F1021 Alcohol dependence, in remission: Secondary | ICD-10-CM

## 2015-04-11 DIAGNOSIS — D7589 Other specified diseases of blood and blood-forming organs: Secondary | ICD-10-CM | POA: Insufficient documentation

## 2015-04-11 DIAGNOSIS — R740 Nonspecific elevation of levels of transaminase and lactic acid dehydrogenase [LDH]: Secondary | ICD-10-CM

## 2015-04-11 DIAGNOSIS — F1020 Alcohol dependence, uncomplicated: Secondary | ICD-10-CM

## 2015-04-11 DIAGNOSIS — J441 Chronic obstructive pulmonary disease with (acute) exacerbation: Secondary | ICD-10-CM | POA: Diagnosis not present

## 2015-04-11 DIAGNOSIS — R7401 Elevation of levels of liver transaminase levels: Secondary | ICD-10-CM

## 2015-04-11 MED ORDER — CLONAZEPAM 0.5 MG PO TABS: 0.5000 mg | ORAL_TABLET | Freq: Two times a day (BID) | ORAL | Status: DC | PRN

## 2015-04-11 MED ORDER — AZITHROMYCIN 250 MG PO TABS: ORAL_TABLET | ORAL | Status: DC

## 2015-04-11 MED ORDER — ZOLPIDEM TARTRATE 5 MG PO TABS: 5.0000 mg | ORAL_TABLET | Freq: Every evening | ORAL | Status: DC | PRN

## 2015-04-11 NOTE — Telephone Encounter (Signed)
[facility] psychiatrist called me back; she says to stop the Ambien after these 3 pills; continue the clonazepam I still need to talk with the other psychiatrist if possible [place]

## 2015-04-11 NOTE — Assessment & Plan Note (Signed)
Start azithromycin which has worked well for patient in the past; may continue neb SABA; continue other inhalers; call if getting worse; rest, hydration, vit C, green tea

## 2015-04-11 NOTE — Assessment & Plan Note (Signed)
Check CBC 

## 2015-04-11 NOTE — Assessment & Plan Note (Signed)
Encouragement given; will be talking to addiction specialist soon, I hope; 3 days of medicine refills given, but he is aware that I do not want to do any harm, but also realize that I can't have him withdraw and possibly suffer a seizure

## 2015-04-11 NOTE — Assessment & Plan Note (Signed)
Numbers already starting to improve in the hospital; reviewed with him, explained toxicity of the alcohol

## 2015-04-11 NOTE — Assessment & Plan Note (Signed)
Check today 

## 2015-04-11 NOTE — Telephone Encounter (Signed)
I need to have records from [place] and I need to be able to speak to the psychiatrist who cared for him during his stay in January Patient has a 1:00 pm appointment today I really need these to happen BEFORE I see the patient

## 2015-04-11 NOTE — Assessment & Plan Note (Signed)
Continue 40 mg Paxil; patient was discharged from [facility] on a benzodiazepine that I had no intention of continuing as an outpatient and I communicated that to the hospitalist caring for him while he was hospitalized; I need to get him in to an addiction specialist; I want to talk with the doctor who cared for him at [facility] to see if continuing two different controlled substances were indeed part of the plan, and did he receive those during his stay there; discussed risk of fatal, unintentional overdose if he drinks alcohol and takes ambien plus/minus clonazepam; even mixing the ambien plus clonazepam can result in fatal OD; patient has enough medicine to last until next week; in the meantime, we will try to get him in to an addiction specialist and communicate with his psych at [facility]

## 2015-04-11 NOTE — Progress Notes (Signed)
BP 123/79 mmHg  Pulse 116  Temp(Src) 97.5 F (36.4 C)  Wt 176 lb (79.833 kg)  SpO2 96%   Subjective:    Patient ID: Devin Bryant, male    DOB: [birth year], 60 y.o.   MRN: [id number]  HPI: Devin Bryant is a 60 y.o. male  Chief Complaint  Patient presents with  . Alcoholism in Recovery    follow up  . Anxiety    follow up   Cough with lots of yellow green sputum; no body aches, COPD; using nebulizer; head congestion; wheezing; no fevers  No decongestants, did have some coffee and did neb 2 hours ago Usually heart rate around 100 anyway; no palpitations Plain zyrtec  GAD 7 : Generalized Anxiety Score 04/11/2015 04/04/2015  Nervous, Anxious, on Edge 2 2  Control/stop worrying 2 2  Worry too much - different things 2 2  Trouble relaxing 2 2  Restless 1 1  Easily annoyed or irritable 1 1  Afraid - awful might happen 1 1  Total GAD 7 Score 11 11  Anxiety Difficulty Somewhat difficult Somewhat difficult   On paroxetine 40 mg daily for about 2 weeks now; does not have appt yet with psychiatrist and I have not heard anything from [place] psych[place]rehab program  Lung doctor does check theophylline level; he sees him for COPD; has had exacerbations of COPD in the past  He is not getting any feeling of wanting to take more of the benzo than prescribed; he does believe it is helping calm his anxiety; no alcohol since last visit  Relevant past medical, surgical, family and social history reviewed and updated as indicated. Interim medical history since our last visit reviewed. Allergies and medications reviewed and updated.  Review of Systems Per HPI unless specifically indicated above     Objective:    BP 123/79 mmHg  Pulse 116  Temp(Src) 97.5 F (36.4 C)  Wt 176 lb (79.833 kg)  SpO2 96%  Wt Readings from Last 3 Encounters:  04/11/15 176 lb (79.833 kg)  04/04/15 176 lb (79.833 kg)  03/12/15 176 lb 5.9 oz (80 kg)  Recheck vitals:  O2 96%, heart rate 110  Physical Exam   Constitutional: He appears well-developed and well-nourished.  HENT:  Right Ear: Tympanic membrane is not erythematous.  Left Ear: Tympanic membrane is not erythematous.  Nose: No rhinorrhea.  Mouth/Throat: Oropharynx is clear and moist and mucous membranes are normal.  Eyes: EOM are normal. No scleral icterus.  Neck: No JVD present.  Cardiovascular: Regular rhythm.   No extrasystoles are present. Tachycardia present.   Pulmonary/Chest: Effort normal. No accessory muscle usage. No respiratory distress. He has wheezes.  Occasional cough; no focal rhonchi  Abdominal: He exhibits no distension.  Musculoskeletal: He exhibits no edema.  Lymphadenopathy:    He has no cervical adenopathy.  Neurological: He is alert.  Skin: Skin is warm. No bruising and no ecchymosis noted. He is not diaphoretic. No pallor.  Psychiatric: His mood appears anxious. His affect is not blunt and not inappropriate. His speech is not delayed and not slurred. He is not agitated, not hyperactive, not slowed and not withdrawn. Cognition and memory are normal. He does not exhibit a depressed mood.  Good eye contact      Assessment & Plan:   Problem List Items Addressed This Visit      Respiratory   COPD with acute exacerbation (HCC) - Primary    Start azithromycin which has worked well for patient in the past;  may continue neb SABA; continue other inhalers; call if getting worse; rest, hydration, vit C, green tea      Relevant Medications   azithromycin (ZITHROMAX) 250 MG tablet     Other   Elevated serum glutamic pyruvic transaminase (SGPT) level    Check today      Relevant Orders   ALT (Completed)   AST (Completed)   Alcoholism in recovery [facility])    Encouragement given; will be talking to addiction specialist soon, I hope; 3 days of medicine refills given, but he is aware that I do not want to do any harm, but also realize that I can't have him withdraw and possibly suffer a seizure      Macrocytosis  without anemia    Check CBC      Relevant Orders   CBC with Differential/Platelet (Completed)      Follow up plan: Return in about 2 weeks (around 04/25/2015) for follow-up.  Meds ordered this encounter  Medications  . azithromycin (ZITHROMAX) 250 MG tablet    Sig: Two pills by mouth today, and then one pill daily for four more days    Dispense:  6 tablet    Refill:  0  . zolpidem (AMBIEN) 5 MG tablet    Sig: Take 1 tablet (5 mg total) by mouth at bedtime as needed for sleep.    Dispense:  3 tablet    Refill:  0  . clonazePAM (KLONOPIN) 0.5 MG tablet    Sig: Take 1 tablet (0.5 mg total) by mouth 2 (two) times daily as needed for anxiety.    Dispense:  6 tablet    Refill:  0   An after-visit summary was printed and given to the patient at check-out.  Please see the patient instructions which may contain other information and recommendations beyond what is mentioned above in the assessment and plan.

## 2015-04-11 NOTE — Patient Instructions (Addendum)
Try vitamin C (orange juice if not diabetic or vitamin C tablets) and drink green tea to help your immune system during your illness Get plenty of rest and hydration Start the new antibiotic We'll contact you just as soon as we hear from my colleagues Keep up your efforts, one day at a time I do encourage you to quit smoking Call [phone] to sign up for smoking cessation classes You can call 1-800-QUIT-NOW to talk with a smoking cessation coach Chronic Obstructive Pulmonary Disease Exacerbation Chronic obstructive pulmonary disease (COPD) is a common lung problem. In COPD, the flow of air from the lungs is limited. COPD exacerbations are times that breathing gets worse and you need extra treatment. Without treatment they can be life threatening. If they happen often, your lungs can become more damaged. If your COPD gets worse, your doctor may treat you with:  Medicines.  Oxygen.  Different ways to clear your airway, such as using a mask. HOME CARE  Do not smoke.  Avoid tobacco smoke and other things that bother your lungs.  If given, take your antibiotic medicine as told. Finish the medicine even if you start to feel better.  Only take medicines as told by your doctor.  Drink enough fluids to keep your pee (urine) clear or pale yellow (unless your doctor has told you not to).  Use a cool mist machine (vaporizer).  If you use oxygen or a machine that turns liquid medicine into a mist (nebulizer), continue to use them as told.  Keep up with shots (vaccinations) as told by your doctor.  Exercise regularly.  Eat healthy foods.  Keep all doctor visits as told. GET HELP RIGHT AWAY IF:  You are very short of breath and it gets worse.  You have trouble talking.  You have bad chest pain.  You have blood in your spit (sputum).  You have a fever.  You keep throwing up (vomiting).  You feel weak, or you pass out (faint).  You feel confused.  You keep getting  worse. MAKE SURE YOU:  Understand these instructions.  Will watch your condition.  Will get help right away if you are not doing well or get worse.   This information is not intended to replace advice given to you by your health care provider. Make sure you discuss any questions you have with your health care provider.   Document Released: 02/08/2011 Document Revised: 03/12/2014 Document Reviewed: 10/24/2012 Elsevier Interactive Patient Education [employer]

## 2015-04-11 NOTE — Assessment & Plan Note (Signed)
Sobriety date of January 9th per patient; he needs to get in to work with an addiction/recovery specialist; I am reluctant to continue any controlled substances, and I showed him the communication I had with his hospitalist; I need to talk to the psychiatrist who treated him in [place] to see if continuing the [place] and clonazepam were supposed to be part of his treatment plan; I am putting in a high level referral to an addiction psychiatrist

## 2015-04-11 NOTE — Telephone Encounter (Signed)
I spoke with [facility], they did get our release. They have let the psychiatrist know and they will call me back.

## 2015-04-12 LAB — CBC WITH DIFFERENTIAL/PLATELET
Basophils Absolute: 0.1 10*3/uL (ref 0.0–0.2)
Basos: 1 %
EOS (ABSOLUTE): 0.5 10*3/uL — AB (ref 0.0–0.4)
Eos: 4 %
Hematocrit: 44.4 % (ref 37.5–51.0)
Hemoglobin: 15 g/dL (ref 12.6–17.7)
IMMATURE GRANULOCYTES: 1 %
Immature Grans (Abs): 0.1 10*3/uL (ref 0.0–0.1)
Lymphocytes Absolute: 1.8 10*3/uL (ref 0.7–3.1)
Lymphs: 16 %
MCH: 34.9 pg — ABNORMAL HIGH (ref 26.6–33.0)
MCHC: 33.8 g/dL (ref 31.5–35.7)
MCV: 103 fL — ABNORMAL HIGH (ref 79–97)
MONOS ABS: 1.1 10*3/uL — AB (ref 0.1–0.9)
Monocytes: 9 %
NEUTROS PCT: 69 %
Neutrophils Absolute: 8.1 10*3/uL — ABNORMAL HIGH (ref 1.4–7.0)
PLATELETS: 254 10*3/uL (ref 150–379)
RBC: 4.3 x10E6/uL (ref 4.14–5.80)
RDW: 13.4 % (ref 12.3–15.4)
WBC: 11.5 10*3/uL — AB (ref 3.4–10.8)

## 2015-04-12 LAB — ALT: ALT: 26 IU/L (ref 0–44)

## 2015-04-12 LAB — AST: AST: 26 IU/L (ref 0–40)

## 2015-04-14 NOTE — Telephone Encounter (Signed)
It sounds like we're not getting anywhere with the phone call Please request records -- I need a discharge medication list and a discharge summary please

## 2015-04-24 NOTE — Telephone Encounter (Signed)
Please see note below and request records; thank you

## 2015-04-25 ENCOUNTER — Ambulatory Visit: Payer: BLUE CROSS/BLUE SHIELD | Admitting: Family Medicine

## 2015-04-27 ENCOUNTER — Encounter: Payer: Self-pay | Admitting: Family Medicine

## 2015-05-02 NOTE — Telephone Encounter (Signed)
Spoke with Misty Stanley and asked that they send [place] a copy of his medical records. She stated she'd look into it and call me back.

## 2015-05-05 NOTE — Telephone Encounter (Signed)
[facility] called back and stated that they needed the faxed release again. [phone]) Upon looking in patient's chart, it doesn't seem as if the release form was scanned into his chart.

## 2015-05-09 NOTE — Telephone Encounter (Signed)
The original release was supposed to have been obtained by Almira CoasterGina at the Jan 30th visit Previous note on Feb 6th below says [facility] actually got our release that we faxed (?)  Ask patient to come back by and sign another release if we don't have one, and we can't find it and they can't find it; I just really would like to get the information I requested; thank you

## 2015-05-10 NOTE — Telephone Encounter (Signed)
Left message for patient to call. We need [facility] to [facility].

## 2015-05-12 NOTE — Telephone Encounter (Signed)
Patient notified, he will come by and sign a release.

## 2015-05-22 ENCOUNTER — Other Ambulatory Visit: Payer: Self-pay | Admitting: Family Medicine

## 2015-05-23 NOTE — Telephone Encounter (Signed)
Left message to call.

## 2015-05-23 NOTE — Telephone Encounter (Signed)
Patient was referred to psychiatrist in January; please ask him to get refills of the Paxil now from his psychiatrist; we hope he is doing well; thanks

## 2015-05-25 MED ORDER — PAROXETINE HCL 40 MG PO TABS: 40.0000 mg | ORAL_TABLET | Freq: Every day | ORAL | Status: DC

## 2015-05-25 NOTE — Telephone Encounter (Signed)
He no showed for our appt on Feb 20th and we sent a letter asking him to reschedule asap Did he get the letter Put him on for next week and I'll approve limited paxil

## 2015-05-25 NOTE — Telephone Encounter (Signed)
I spoke with patient, he has not been able to get in to see psych yet. I gave him the numbers of [facility], [facility], [facility], and [facility]. He's going to call them and get an appointment. Can you refill his paxil until he can be seen?

## 2015-05-27 NOTE — Telephone Encounter (Signed)
Left message to call. I put him on the schedule for 05/31/15 at 8:30am.

## 2015-05-30 NOTE — Telephone Encounter (Signed)
Patient has appointment scheduled for tomorrow morning.

## 2015-05-31 ENCOUNTER — Ambulatory Visit: Payer: Self-pay | Admitting: Family Medicine

## 2015-05-31 NOTE — Telephone Encounter (Signed)
Left message to call.

## 2015-06-01 ENCOUNTER — Encounter: Payer: Self-pay | Admitting: Family Medicine

## 2015-06-27 ENCOUNTER — Emergency Department: Payer: BLUE CROSS/BLUE SHIELD

## 2015-06-27 ENCOUNTER — Inpatient Hospital Stay
Admission: EM | Admit: 2015-06-27 | Discharge: 2015-06-29 | DRG: 189 | Disposition: A | Discharge status: Home / Self Care | Payer: BLUE CROSS/BLUE SHIELD | Attending: Internal Medicine | Admitting: Internal Medicine

## 2015-06-27 ENCOUNTER — Encounter: Payer: Self-pay | Admitting: Emergency Medicine

## 2015-06-27 DIAGNOSIS — Z801 Family history of malignant neoplasm of trachea, bronchus and lung: Secondary | ICD-10-CM | POA: Diagnosis not present

## 2015-06-27 DIAGNOSIS — K219 Gastro-esophageal reflux disease without esophagitis: Secondary | ICD-10-CM | POA: Diagnosis present

## 2015-06-27 DIAGNOSIS — F419 Anxiety disorder, unspecified: Secondary | ICD-10-CM | POA: Diagnosis present

## 2015-06-27 DIAGNOSIS — Z825 Family history of asthma and other chronic lower respiratory diseases: Secondary | ICD-10-CM

## 2015-06-27 DIAGNOSIS — Z7951 Long term (current) use of inhaled steroids: Secondary | ICD-10-CM

## 2015-06-27 DIAGNOSIS — J441 Chronic obstructive pulmonary disease with (acute) exacerbation: Secondary | ICD-10-CM

## 2015-06-27 DIAGNOSIS — Z716 Tobacco abuse counseling: Secondary | ICD-10-CM

## 2015-06-27 DIAGNOSIS — R0902 Hypoxemia: Secondary | ICD-10-CM | POA: Diagnosis present

## 2015-06-27 DIAGNOSIS — Z8249 Family history of ischemic heart disease and other diseases of the circulatory system: Secondary | ICD-10-CM

## 2015-06-27 DIAGNOSIS — J9621 Acute and chronic respiratory failure with hypoxia: Principal | ICD-10-CM | POA: Diagnosis present

## 2015-06-27 DIAGNOSIS — F17210 Nicotine dependence, cigarettes, uncomplicated: Secondary | ICD-10-CM | POA: Diagnosis present

## 2015-06-27 DIAGNOSIS — I10 Essential (primary) hypertension: Secondary | ICD-10-CM | POA: Diagnosis present

## 2015-06-27 DIAGNOSIS — J9620 Acute and chronic respiratory failure, unspecified whether with hypoxia or hypercapnia: Secondary | ICD-10-CM | POA: Diagnosis present

## 2015-06-27 DIAGNOSIS — Z9889 Other specified postprocedural states: Secondary | ICD-10-CM

## 2015-06-27 DIAGNOSIS — Z7982 Long term (current) use of aspirin: Secondary | ICD-10-CM | POA: Diagnosis not present

## 2015-06-27 DIAGNOSIS — Z833 Family history of diabetes mellitus: Secondary | ICD-10-CM

## 2015-06-27 DIAGNOSIS — Z5181 Encounter for therapeutic drug level monitoring: Secondary | ICD-10-CM | POA: Diagnosis not present

## 2015-06-27 HISTORY — DX: Emphysema, unspecified: J43.9

## 2015-06-27 LAB — CBC WITH DIFFERENTIAL/PLATELET
Basophils Absolute: 0.1 10*3/uL (ref 0–0.1)
Basophils Relative: 1 %
EOS PCT: 10 %
Eosinophils Absolute: 0.9 10*3/uL — ABNORMAL HIGH (ref 0–0.7)
HEMATOCRIT: 44.3 % (ref 40.0–52.0)
Hemoglobin: 15 g/dL (ref 13.0–18.0)
LYMPHS PCT: 14 %
Lymphs Abs: 1.3 10*3/uL (ref 1.0–3.6)
MCH: 35.1 pg — AB (ref 26.0–34.0)
MCHC: 33.8 g/dL (ref 32.0–36.0)
MCV: 103.8 fL — AB (ref 80.0–100.0)
MONO ABS: 1.1 10*3/uL — AB (ref 0.2–1.0)
MONOS PCT: 12 %
NEUTROS ABS: 5.7 10*3/uL (ref 1.4–6.5)
Neutrophils Relative %: 63 %
Platelets: 203 10*3/uL (ref 150–440)
RBC: 4.26 MIL/uL — ABNORMAL LOW (ref 4.40–5.90)
RDW: 15.2 % — AB (ref 11.5–14.5)
WBC: 9.1 10*3/uL (ref 3.8–10.6)

## 2015-06-27 LAB — BASIC METABOLIC PANEL
ANION GAP: 10 (ref 5–15)
BUN: 8 mg/dL (ref 6–20)
CO2: 29 mmol/L (ref 22–32)
Calcium: 9.4 mg/dL (ref 8.9–10.3)
Chloride: 100 mmol/L — ABNORMAL LOW (ref 101–111)
Creatinine, Ser: 0.73 mg/dL (ref 0.61–1.24)
GFR calc Af Amer: >60 mL/min (ref >60)
GFR calc non Af Amer: >60 mL/min (ref >60)
GLUCOSE: 123 mg/dL — AB (ref 65–99)
POTASSIUM: 4.2 mmol/L (ref 3.5–5.1)
SODIUM: 139 mmol/L (ref 135–145)

## 2015-06-27 LAB — TROPONIN I: Troponin I: <0.03 ng/mL (ref <0.031)

## 2015-06-27 MED ORDER — GABAPENTIN 300 MG PO CAPS
300.0000 mg | ORAL_CAPSULE | Freq: Two times a day (BID) | ORAL | Status: DC
  Administered 2015-06-27 – 2015-06-29 (×5): 300 mg via ORAL
  Filled 2015-06-27 (×6): qty 1

## 2015-06-27 MED ORDER — ACETAMINOPHEN 325 MG PO TABS: 650.0000 mg | ORAL_TABLET | Freq: Four times a day (QID) | ORAL | Status: DC | PRN

## 2015-06-27 MED ORDER — HYDROCODONE-ACETAMINOPHEN 5-325 MG PO TABS: 1.0000 | ORAL_TABLET | ORAL | Status: DC | PRN

## 2015-06-27 MED ORDER — IPRATROPIUM-ALBUTEROL 0.5-2.5 (3) MG/3ML IN SOLN
3.0000 mL | RESPIRATORY_TRACT | Status: DC
  Administered 2015-06-27 – 2015-06-28 (×7): 3 mL via RESPIRATORY_TRACT
  Filled 2015-06-27 (×7): qty 3

## 2015-06-27 MED ORDER — METHYLPREDNISOLONE SODIUM SUCC 125 MG IJ SOLR
60.0000 mg | Freq: Two times a day (BID) | INTRAMUSCULAR | Status: DC
  Administered 2015-06-28 – 2015-06-29 (×3): 60 mg via INTRAVENOUS
  Filled 2015-06-27 (×3): qty 2

## 2015-06-27 MED ORDER — GUAIFENESIN-CODEINE 100-10 MG/5ML PO SOLN
5.0000 mL | ORAL | Status: DC | PRN
  Administered 2015-06-27 – 2015-06-29 (×5): 5 mL via ORAL
  Filled 2015-06-27 (×5): qty 5

## 2015-06-27 MED ORDER — ZOLPIDEM TARTRATE 5 MG PO TABS
5.0000 mg | ORAL_TABLET | Freq: Every evening | ORAL | Status: DC | PRN
  Administered 2015-06-27 – 2015-06-28 (×2): 5 mg via ORAL
  Filled 2015-06-27 (×2): qty 1

## 2015-06-27 MED ORDER — DOCUSATE SODIUM 100 MG PO CAPS
100.0000 mg | ORAL_CAPSULE | Freq: Two times a day (BID) | ORAL | Status: DC
  Administered 2015-06-27 – 2015-06-28 (×3): 100 mg via ORAL
  Filled 2015-06-27 (×4): qty 1

## 2015-06-27 MED ORDER — LEVOFLOXACIN IN D5W 500 MG/100ML IV SOLN
500.0000 mg | INTRAVENOUS | Status: DC
  Filled 2015-06-27: qty 100

## 2015-06-27 MED ORDER — MOMETASONE FURO-FORMOTEROL FUM 200-5 MCG/ACT IN AERO
2.0000 | INHALATION_SPRAY | Freq: Two times a day (BID) | RESPIRATORY_TRACT | Status: DC
  Administered 2015-06-27 – 2015-06-29 (×4): 2 via RESPIRATORY_TRACT
  Filled 2015-06-27: qty 8.8

## 2015-06-27 MED ORDER — NICOTINE 14 MG/24HR TD PT24
14.0000 mg | MEDICATED_PATCH | Freq: Every day | TRANSDERMAL | Status: DC
  Administered 2015-06-27 – 2015-06-28 (×2): 14 mg via TRANSDERMAL
  Filled 2015-06-27 (×2): qty 1

## 2015-06-27 MED ORDER — LEVOFLOXACIN IN D5W 500 MG/100ML IV SOLN
500.0000 mg | Freq: Once | INTRAVENOUS | Status: AC
  Administered 2015-06-27: 500 mg via INTRAVENOUS
  Filled 2015-06-27: qty 100

## 2015-06-27 MED ORDER — LORATADINE 10 MG PO TABS
10.0000 mg | ORAL_TABLET | Freq: Every day | ORAL | Status: DC
  Administered 2015-06-28 – 2015-06-29 (×2): 10 mg via ORAL
  Filled 2015-06-27 (×2): qty 1

## 2015-06-27 MED ORDER — ONDANSETRON HCL 4 MG PO TABS: 4.0000 mg | ORAL_TABLET | Freq: Four times a day (QID) | ORAL | Status: DC | PRN

## 2015-06-27 MED ORDER — ONDANSETRON HCL 4 MG/2ML IJ SOLN: 4.0000 mg | Freq: Four times a day (QID) | INTRAMUSCULAR | Status: DC | PRN

## 2015-06-27 MED ORDER — ALBUTEROL (5 MG/ML) CONTINUOUS INHALATION SOLN
5.0000 mg/h | INHALATION_SOLUTION | RESPIRATORY_TRACT | Status: DC
  Filled 2015-06-27: qty 20

## 2015-06-27 MED ORDER — TIOTROPIUM BROMIDE MONOHYDRATE 18 MCG IN CAPS
18.0000 ug | ORAL_CAPSULE | Freq: Every day | RESPIRATORY_TRACT | Status: DC
  Administered 2015-06-28 – 2015-06-29 (×2): 18 ug via RESPIRATORY_TRACT
  Filled 2015-06-27: qty 5

## 2015-06-27 MED ORDER — FLUTICASONE PROPIONATE 50 MCG/ACT NA SUSP
1.0000 | Freq: Every day | NASAL | Status: DC
  Administered 2015-06-28 – 2015-06-29 (×2): 1 via NASAL
  Filled 2015-06-27: qty 16

## 2015-06-27 MED ORDER — METHYLPREDNISOLONE SODIUM SUCC 125 MG IJ SOLR
60.0000 mg | Freq: Once | INTRAMUSCULAR | Status: AC
  Administered 2015-06-27: 60 mg via INTRAVENOUS
  Filled 2015-06-27: qty 2

## 2015-06-27 MED ORDER — CLONAZEPAM 0.5 MG PO TABS
0.5000 mg | ORAL_TABLET | Freq: Every day | ORAL | Status: DC
  Filled 2015-06-27: qty 1

## 2015-06-27 MED ORDER — PANTOPRAZOLE SODIUM 40 MG PO TBEC
40.0000 mg | DELAYED_RELEASE_TABLET | Freq: Every day | ORAL | Status: DC
  Administered 2015-06-28 – 2015-06-29 (×2): 40 mg via ORAL
  Filled 2015-06-27 (×2): qty 1

## 2015-06-27 MED ORDER — PAROXETINE HCL 20 MG PO TABS
20.0000 mg | ORAL_TABLET | Freq: Every day | ORAL | Status: DC
  Administered 2015-06-28 – 2015-06-29 (×2): 20 mg via ORAL
  Filled 2015-06-27 (×2): qty 1

## 2015-06-27 MED ORDER — IPRATROPIUM-ALBUTEROL 0.5-2.5 (3) MG/3ML IN SOLN
RESPIRATORY_TRACT | Status: AC
  Administered 2015-06-27: 3 mL
  Filled 2015-06-27: qty 6

## 2015-06-27 MED ORDER — BISACODYL 5 MG PO TBEC: 5.0000 mg | DELAYED_RELEASE_TABLET | Freq: Every day | ORAL | Status: DC | PRN

## 2015-06-27 MED ORDER — ASPIRIN EC 81 MG PO TBEC
81.0000 mg | DELAYED_RELEASE_TABLET | Freq: Every day | ORAL | Status: DC
  Administered 2015-06-28 – 2015-06-29 (×2): 81 mg via ORAL
  Filled 2015-06-27 (×2): qty 1

## 2015-06-27 MED ORDER — ENOXAPARIN SODIUM 40 MG/0.4ML ~~LOC~~ SOLN
30.0000 mg | SUBCUTANEOUS | Status: DC
  Administered 2015-06-27: 30 mg via SUBCUTANEOUS
  Filled 2015-06-27: qty 0.4

## 2015-06-27 MED ORDER — THEOPHYLLINE ER 300 MG PO TB12
300.0000 mg | ORAL_TABLET | Freq: Two times a day (BID) | ORAL | Status: DC
  Administered 2015-06-27 – 2015-06-29 (×5): 300 mg via ORAL
  Filled 2015-06-27 (×6): qty 1

## 2015-06-27 MED ORDER — ACETAMINOPHEN 650 MG RE SUPP: 650.0000 mg | Freq: Four times a day (QID) | RECTAL | Status: DC | PRN

## 2015-06-27 NOTE — ED Notes (Signed)
Pt continuous duoneb complete, pt placed on 4L nasal cannula.  Pt O2 sat 94% at this time.

## 2015-06-27 NOTE — ED Provider Notes (Signed)
[facility] Emergency Department Provider Note   ____________________________________________  Time seen: Upon ED arrival by EMS I have reviewed the triage vital signs and the triage nursing note.  HISTORY  Chief Complaint Shortness of Breath   Historian Patient  HPI Devin Bryant is a 60 y.o. male with a history of COPD, who wears home oxygen as needed, reports increased wheezing and trouble breathing since Thursday, about 4 days ago at this point. He started wearing home oxygen and increased to 5 L over the weekend. No specific chest pain. No cardiac history. No lower extremity swelling. No fevers. Coughing which is nonproductive. States in the past Augmentin has worked for bronchitis, and Z-Pak has not worked.  Nothing really makes it worse or better. He took an albuterol treatment at home before EMS arrived, and he received 3 DuoNeb treatments on the way to the hospital and had respiratory rate improvement from 45 down to 30, and EMS reports that when they came to him he was wearing 5 L nasal cannula and satting 85%.  Symptoms are moderate to severe.    Past Medical History  Diagnosis Date  . Allergy   . Hypertension   . COPD (chronic obstructive pulmonary disease) (HCC)   . GERD (gastroesophageal reflux disease)   . Adjustment disorder with depressed mood   . ED (erectile dysfunction)   . Tobacco use   . Hypertriglyceridemia   . Elevated glucose   . Elevated serum glutamic pyruvic transaminase (SGPT) level   . Anxiety   . Emphysema lung [facility])     Patient Active Problem List   Diagnosis Date Noted  . Macrocytosis without anemia 04/11/2015  . Alcoholism in recovery (HCC) 04/04/2015  . Alcohol withdrawal (HCC) 03/11/2015  . COPD exacerbation (HCC) 10/19/2014  . Tobacco abuse 10/19/2014  . Anxiety disorder 10/17/2014  . COPD with acute exacerbation (HCC) 10/15/2014  . Allergy   . Hypertension   . COPD (chronic obstructive pulmonary disease)  (HCC)   . Adjustment disorder with depressed mood   . ED (erectile dysfunction)   . Tobacco use   . Hypertriglyceridemia   . Elevated glucose   . Elevated serum glutamic pyruvic transaminase (SGPT) level     Past Surgical History  Procedure Laterality Date  . Septoplasty  Feb 2016  . Knee surgery      arthroscopic    Current Outpatient Rx  Name  Route  Sig  Dispense  Refill  . ADVAIR HFA 115-21 MCG/ACT inhaler   Inhalation   Inhale 2 puffs into the lungs 2 (two) times daily.            Dispense as written.   Marland Kitchen albuterol (PROVENTIL HFA;VENTOLIN HFA) 108 (90 BASE) MCG/ACT inhaler   Inhalation   Inhale 2 puffs into the lungs every 6 (six) hours as needed for wheezing or shortness of breath.          Marland Kitchen albuterol (PROVENTIL) (2.5 MG/3ML) 0.083% nebulizer solution   Nebulization   Take 3 mLs (2.5 mg total) by nebulization every 4 (four) hours as needed for wheezing or shortness of breath.   50 mL   3   . aspirin EC 81 MG tablet   Oral   Take 81 mg by mouth daily.         . Cetirizine HCl (ZYRTEC ALLERGY) 10 MG CAPS   Oral   Take 10 mg by mouth daily.         . Clobetasol Propionate 0.05 %  shampoo   Topical   Apply 1 application topically as needed.          . clonazePAM (KLONOPIN) 0.5 MG tablet   Oral   Take 1 tablet (0.5 mg total) by mouth 2 (two) times daily as needed for anxiety.   6 tablet   0   . CORDRAN 0.05 % lotion   Topical   Apply 1 application topically 2 (two) times daily. Reported on 04/04/2015           Dispense as written.   . Desoximetasone (TOPICORT SPRAY EX)   Topical   Apply 1 application topically 2 (two) times daily as needed (scorisis/irritation over body. Use all over except for on face.). Reported on 04/04/2015         . esomeprazole (NEXIUM) 20 MG capsule   Oral   Take 20 mg by mouth daily at 12 noon.         . fluticasone (FLONASE) 50 MCG/ACT nasal spray   Each Nare   Place 1 spray into both nostrils daily.           Marland Kitchen. gabapentin (NEURONTIN) 300 MG capsule   Oral   Take 300 mg by mouth 2 (two) times daily.         Marland Kitchen. ketoconazole (NIZORAL) 2 % cream      1 APPLICATION APPLY ON THE SKIN DAILY      3   . mometasone (ELOCON) 0.1 % ointment   Topical   Apply 1 application topically daily as needed (for ear irritation/eczema in ear). Apply to each ear 1 drop daily as needed         . Multiple Vitamin (MULTIVITAMIN) tablet   Oral   Take 1 tablet by mouth daily.         Marland Kitchen. nystatin (MYCOSTATIN) 100000 UNIT/ML suspension   Oral   Take 5 mLs by mouth 4 (four) times daily as needed. thrush      13   . PARoxetine (PAXIL) 20 MG tablet   Oral   Take 20 mg by mouth daily.         . theophylline (THEODUR) 300 MG 12 hr tablet   Oral   Take 300 mg by mouth 2 (two) times daily.      11   . tiotropium (SPIRIVA HANDIHALER) 18 MCG inhalation capsule   Inhalation   Place 1 capsule (18 mcg total) into inhaler and inhale daily.   30 capsule   0   . zolpidem (AMBIEN) 5 MG tablet   Oral   Take 1 tablet (5 mg total) by mouth at bedtime as needed for sleep.   3 tablet   0     Allergies Review of patient's allergies indicates no known allergies.  Family History  Problem Relation Age of Onset  . Diabetes Mother   . Cancer Father     lung  . Heart disease Father   . Heart attack Father   . COPD Sister   . Heart disease Brother   . Heart attack Brother   . COPD Brother   . Hypertension Neg Hx   . Stroke Neg Hx     Social History Social History  Substance Use Topics  . Smoking status: Current Every Day Smoker -- 1.00 packs/day for 40 years    Types: Cigarettes  . Smokeless tobacco: Never Used  . Alcohol Use: No     Comment: none since Mar 14, 2015    Review of Systems  Constitutional: Negative for fever. Eyes: Negative for visual changes. ENT: Negative for sore throat. Cardiovascular: Negative for chest pain. Respiratory: Positive for shortness of  breath. Gastrointestinal: Negative for abdominal pain, vomiting and diarrhea. Genitourinary: Negative for dysuria. Musculoskeletal: Negative for back pain. Skin: Negative for rash. Neurological: Negative for headache. 10 point Review of Systems otherwise negative ____________________________________________   PHYSICAL EXAM:  VITAL SIGNS: ED Triage Vitals  Enc Vitals Group     BP --      Pulse --      Resp --      Temp --      Temp src --      SpO2 --      Weight --      Height --      Head Cir --      Peak Flow --      Pain Score --      Pain Loc --      Pain Edu? --      Excl. in GC? --      Constitutional: Alert and oriented. Well appearing and in no distress. HEENT   Head: Normocephalic and atraumatic.      Eyes: Conjunctivae are normal. PERRL. Normal extraocular movements.      Ears:         Nose: No congestion/rhinnorhea.   Mouth/Throat: Mucous membranes are moist.   Neck: No stridor. Cardiovascular/Chest: Tachycardic, regular rhythm.  No murmurs, rubs, or gallops. Respiratory: Moderate respiratory distress, talking in few word persons. Severe wheezing in all fields, and expiratory. No rhonchi or rales noted. Gastrointestinal: Soft. No distention, no guarding, no rebound. Nontender.    Genitourinary/rectal:Deferred Musculoskeletal: Nontender with normal range of motion in all extremities. No joint effusions.  No lower extremity tenderness.  No edema. Neurologic:  Normal speech and language. No gross or focal neurologic deficits are appreciated. Skin:  Skin is warm, dry and intact. No rash noted. Psychiatric: Mood and affect are normal. Speech and behavior are normal. Patient exhibits appropriate insight and judgment.  ____________________________________________   EKG I, Governor Rooks, MD, the attending physician have personally viewed and interpreted all ECGs.  111 bpm. Sinus tachycardia. Narrow QRS. Normal axis. Nonspecific  T-wave ____________________________________________  LABS (pertinent positives/negatives)  Basic metabolic panel without significant abnormalities White blood count 9.1, hemoglobin 50.0 white count with 3 Troponin less than 0.03  ____________________________________________  RADIOLOGY All Xrays were viewed by me. Imaging interpreted by Radiologist.  Chest: No acute findings __________________________________________  PROCEDURES  Procedure(s) performed: None  Critical Care performed: CRITICAL CARE Performed by: Governor Rooks   Total critical care time: 30 minutes  Critical care time was exclusive of separately billable procedures and treating other patients.  Critical care was necessary to treat or prevent imminent or life-threatening deterioration.  Critical care was time spent personally by me on the following activities: development of treatment plan with patient and/or surrogate as well as nursing, discussions with consultants, evaluation of patient's response to treatment, examination of patient, obtaining history from patient or surrogate, ordering and performing treatments and interventions, ordering and review of laboratory studies, ordering and review of radiographic studies, pulse oximetry and re-evaluation of patient's condition.   ____________________________________________   ED COURSE / ASSESSMENT AND PLAN  Pertinent labs & imaging results that were available during my care of the patient were reviewed by me and considered in my medical decision making (see chart for details).  Patient received 62.5 mg of Solu-Medrol in route with EMS, I will add  60 mg additional IV dose.  Patient on a nonrebreather upon arrival, and satting 100%. Given his history of COPD Casimiro Needle oxygenation is somewhere between 90 and 94%, patient will be moved back to nasal cannula to see how he tolerates.  Given the still increased respiratory rate and severe wheezing I am cannot place  the patient on continuous albuterol nebulizer.  He is not reporting cardiac symptoms or history of coronary artery disease/heart issues such as congestive heart failure.  ----------------------------------------- 1:17 PM on 06/27/2015 -----------------------------------------  Chest x-ray without signs of pneumonia. Patient ready for hospital admission.    CONSULTATIONS:  Hospitalist for admission.  Patient / Family / Caregiver informed of clinical course, medical decision-making process, and agree with plan.     ___________________________________________   FINAL CLINICAL IMPRESSION(S) / ED DIAGNOSES   Final diagnoses:  COPD exacerbation (HCC)  Hypoxia              Note: This dictation was prepared with Dragon dictation. Any transcriptional errors that result from this process are unintentional   Governor Rooks, MD 06/27/15 1320

## 2015-06-27 NOTE — Progress Notes (Signed)
ANTIBIOTIC CONSULT NOTE - INITIAL  Pharmacy Consult for Levaquin  Indication: CAP  No Known Allergies  Patient Measurements: Height: 5\' 10"  (177.8 cm) Weight: 180 lb (81.647 kg) IBW/kg (Calculated) : 73 Adjusted Body Weight:   Vital Signs: Temp: 97.7 F (36.5 C) (04/24 1540) Temp Source: Oral (04/24 1540) BP: 164/95 mmHg (04/24 1540) Pulse Rate: 105 (04/24 1540) Intake/Output from previous day:   Intake/Output from this shift:    Labs:  Recent Labs  06/27/15 1122  WBC 9.1  HGB 15.0  PLT 203  CREATININE 0.73   Estimated Creatinine Clearance: 98.9 mL/min (by C-G formula based on Cr of 0.73). No results for input(s): VANCOTROUGH, VANCOPEAK, VANCORANDOM, GENTTROUGH, GENTPEAK, GENTRANDOM, TOBRATROUGH, TOBRAPEAK, TOBRARND, AMIKACINPEAK, AMIKACINTROU, AMIKACIN in the last 72 hours.   Microbiology: No results found for this or any previous visit (from the past 720 hour(s)).  Medical History: Past Medical History  Diagnosis Date  . Allergy   . Hypertension   . COPD (chronic obstructive pulmonary disease) (HCC)   . GERD (gastroesophageal reflux disease)   . Adjustment disorder with depressed mood   . ED (erectile dysfunction)   . Tobacco use   . Hypertriglyceridemia   . Elevated glucose   . Elevated serum glutamic pyruvic transaminase (SGPT) level   . Anxiety   . Emphysema lung (HCC)     Medications:  Prescriptions prior to admission  Medication Sig Dispense Refill Last Dose  . ADVAIR HFA 115-21 MCG/ACT inhaler Inhale 2 puffs into the lungs 2 (two) times daily.    Past Week at Unknown time  . albuterol (PROVENTIL HFA;VENTOLIN HFA) 108 (90 BASE) MCG/ACT inhaler Inhale 2 puffs into the lungs every 6 (six) hours as needed for wheezing or shortness of breath.    prn at prn  . albuterol (PROVENTIL) (2.5 MG/3ML) 0.083% nebulizer solution Take 3 mLs (2.5 mg total) by nebulization every 4 (four) hours as needed for wheezing or shortness of breath. 50 mL 3 prn at prn  .  Cetirizine HCl (ZYRTEC ALLERGY) 10 MG CAPS Take 10 mg by mouth daily.   prn at prn  . Clobetasol Propionate 0.05 % shampoo Apply 1 application topically as needed.    prn at prn  . clonazePAM (KLONOPIN) 0.5 MG tablet Take 1 tablet (0.5 mg total) by mouth 2 (two) times daily as needed for anxiety. 6 tablet 0 prn at prn  . CORDRAN 0.05 % lotion Apply 1 application topically 2 (two) times daily. Reported on 04/04/2015   prn at prn  . Desoximetasone (TOPICORT SPRAY EX) Apply 1 application topically 2 (two) times daily as needed (scorisis/irritation over body. Use all over except for on face.). Reported on 04/04/2015   prn at prn  . esomeprazole (NEXIUM) 20 MG capsule Take 20 mg by mouth daily at 12 noon.   06/27/2015 at Unknown time  . fluticasone (FLONASE) 50 MCG/ACT nasal spray Place 1 spray into both nostrils daily.    06/27/2015 at Unknown time  . gabapentin (NEURONTIN) 300 MG capsule Take 300 mg by mouth 2 (two) times daily.   06/27/2015 at Unknown time  . ketoconazole (NIZORAL) 2 % cream 1 APPLICATION APPLY ON THE SKIN DAILY  3 prn at prn  . mometasone (ELOCON) 0.1 % ointment Apply 1 application topically daily as needed (for ear irritation/eczema in ear). Apply to each ear 1 drop daily as needed   prn at prn  . Multiple Vitamin (MULTIVITAMIN) tablet Take 1 tablet by mouth daily.   06/27/2015 at Unknown  time  . nystatin (MYCOSTATIN) 100000 UNIT/ML suspension Take 5 mLs by mouth 4 (four) times daily as needed. thrush  13 prn at prn  . PARoxetine (PAXIL) 20 MG tablet Take 20 mg by mouth daily.   06/27/2015 at Unknown time  . theophylline (THEODUR) 300 MG 12 hr tablet Take 300 mg by mouth 2 (two) times daily.  11 06/27/2015 at Unknown time  . tiotropium (SPIRIVA HANDIHALER) 18 MCG inhalation capsule Place 1 capsule (18 mcg total) into inhaler and inhale daily. 30 capsule 0 06/27/2015 at Unknown time  . zolpidem (AMBIEN) 5 MG tablet Take 1 tablet (5 mg total) by mouth at bedtime as needed for sleep. 3 tablet 0  Past Month at Unknown time  . aspirin EC 81 MG tablet Take 81 mg by mouth daily. Reported on 06/27/2015   Not Taking at Unknown time   Assessment: CrCl = 98.9 ml/min  Goal of Therapy:  resolution of infection  Plan:  Expected duration 7 days with resolution of temperature and/or normalization of WBC   Levaquin 500 mg IV X 1 ordered to be given on 4/24 followed by levaquin 500 mg IV Q24H to start on 4/25 @ 18:00.   Devin Bryant D 06/27/2015,5:18 PM

## 2015-06-27 NOTE — ED Notes (Signed)
Pt in via EMS with complaints of shortness of breath since Thursday; pt with home albuterol treatments and O2 PRN, pt using 5L home O2 since Thursday with worsening shortness of breath over the last few days.  Pt with hx of COPD, emphysema.  Pt receiving third duoneb upon arrival.  Pt placed on 6L nasal cannula post duo neb to maintain sat of 92%.  Pt A/Ox4, hypertensive, tachypneic upon arrival.  MD at bedside.

## 2015-06-27 NOTE — H&P (Signed)
[facility] - [place] at [facility]   PATIENT NAME: Devin Bryant    MR#:  [id number]  DATE OF BIRTH:  May 20, 1955  DATE OF ADMISSION:  06/27/2015  PRIMARY CARE PHYSICIAN: Baruch Gouty, MD   REQUESTING/REFERRING PHYSICIAN: Dr. Governor Rooks  CHIEF COMPLAINT: Shortness of breath   Chief Complaint  Patient presents with  . Shortness of Breath    HISTORY OF PRESENT ILLNESS:  Devin Bryant  is a 60 y.o. male with a known history of COPD, emphysema on 5 L of oxygen came in because of shortness of breath, wheezing. Has been having shortness of breath for last 4 days did not get better with nebulizers, oxygen saturations were in low 80s on 5 L of oxygen. At home. Today shortness of breath got worse unable to breathe ,so he called ambulance. Patient received the results of nebulizers in the ER. Now he says he feels little better than when he came in and able to talk  Full sentences.  PAST MEDICAL HISTORY:   Past Medical History  Diagnosis Date  . Allergy   . Hypertension   . COPD (chronic obstructive pulmonary disease) (HCC)   . GERD (gastroesophageal reflux disease)   . Adjustment disorder with depressed mood   . ED (erectile dysfunction)   . Tobacco use   . Hypertriglyceridemia   . Elevated glucose   . Elevated serum glutamic pyruvic transaminase (SGPT) level   . Anxiety   . Emphysema lung (HCC)     PAST SURGICAL HISTOIRY:   Past Surgical History  Procedure Laterality Date  . Septoplasty  Feb 2016  . Knee surgery      arthroscopic    SOCIAL HISTORY:   Social History  Substance Use Topics  . Smoking status: Current Every Day Smoker -- 1.00 packs/day for 40 years    Types: Cigarettes  . Smokeless tobacco: Never Used  . Alcohol Use: No     Comment: none since Mar 14, 2015    FAMILY HISTORY:   Family History  Problem Relation Age of Onset  . Diabetes Mother   . Cancer Father     lung  . Heart disease Father   . Heart attack Father   .  COPD Sister   . Heart disease Brother   . Heart attack Brother   . COPD Brother   . Hypertension Neg Hx   . Stroke Neg Hx     DRUG ALLERGIES:  No Known Allergies  REVIEW OF SYSTEMS:  CONSTITUTIONAL: No fever, fatigue or weakness.  EYES: No blurred or double vision.  EARS, NOSE, AND THROAT: No tinnitus or ear pain.  RESPIRATORY:  cough, shortness of breath, wheezing for 4 days  CARDIOVASCULAR: No chest pain, orthopnea, edema.  GASTROINTESTINAL: No nausea, vomiting, diarrhea or abdominal pain.  GENITOURINARY: No dysuria, hematuria.  ENDOCRINE: No polyuria, nocturia,  HEMATOLOGY: No anemia, easy bruising or bleeding SKIN: No rash or lesion. MUSCULOSKELETAL: No joint pain or arthritis.   NEUROLOGIC: No tingling, numbness, weakness.  PSYCHIATRY: No anxiety or depression.   MEDICATIONS AT HOME:   Prior to Admission medications   Medication Sig Start Date End Date Taking? Authorizing Provider  ADVAIR HFA 115-21 MCG/ACT inhaler Inhale 2 puffs into the lungs 2 (two) times daily.  10/11/14  Yes Historical Provider, MD  albuterol (PROVENTIL HFA;VENTOLIN HFA) 108 (90 BASE) MCG/ACT inhaler Inhale 2 puffs into the lungs every 6 (six) hours as needed for wheezing or shortness of breath.    Yes Historical  Provider, MD  albuterol (PROVENTIL) (2.5 MG/3ML) 0.083% nebulizer solution Take 3 mLs (2.5 mg total) by nebulization every 4 (four) hours as needed for wheezing or shortness of breath. 10/15/14  Yes Kerman PasseyMelinda P Lada, MD  aspirin EC 81 MG tablet Take 81 mg by mouth daily.   Yes Historical Provider, MD  Cetirizine HCl (ZYRTEC ALLERGY) 10 MG CAPS Take 10 mg by mouth daily.   Yes Historical Provider, MD  Clobetasol Propionate 0.05 % shampoo Apply 1 application topically as needed.  03/31/15  Yes Historical Provider, MD  clonazePAM (KLONOPIN) 0.5 MG tablet Take 1 tablet (0.5 mg total) by mouth 2 (two) times daily as needed for anxiety. 04/11/15  Yes Kerman PasseyMelinda P Lada, MD  CORDRAN 0.05 % lotion Apply 1  application topically 2 (two) times daily. Reported on 04/04/2015 03/09/15  Yes Historical Provider, MD  Desoximetasone (TOPICORT SPRAY EX) Apply 1 application topically 2 (two) times daily as needed (scorisis/irritation over body. Use all over except for on face.). Reported on 04/04/2015   Yes Historical Provider, MD  esomeprazole (NEXIUM) 20 MG capsule Take 20 mg by mouth daily at 12 noon.   Yes Historical Provider, MD  fluticasone (FLONASE) 50 MCG/ACT nasal spray Place 1 spray into both nostrils daily.  07/23/14  Yes Historical Provider, MD  gabapentin (NEURONTIN) 300 MG capsule Take 300 mg by mouth 2 (two) times daily. 03/22/15  Yes Historical Provider, MD  ketoconazole (NIZORAL) 2 % cream 1 APPLICATION APPLY ON THE SKIN DAILY 03/30/15  Yes Historical Provider, MD  mometasone (ELOCON) 0.1 % ointment Apply 1 application topically daily as needed (for ear irritation/eczema in ear). Apply to each ear 1 drop daily as needed   Yes Historical Provider, MD  Multiple Vitamin (MULTIVITAMIN) tablet Take 1 tablet by mouth daily.   Yes Historical Provider, MD  nystatin (MYCOSTATIN) 100000 UNIT/ML suspension Take 5 mLs by mouth 4 (four) times daily as needed. thrush 02/08/15  Yes Historical Provider, MD  PARoxetine (PAXIL) 20 MG tablet Take 20 mg by mouth daily.   Yes Historical Provider, MD  theophylline (THEODUR) 300 MG 12 hr tablet Take 300 mg by mouth 2 (two) times daily. 03/02/15  Yes Historical Provider, MD  tiotropium (SPIRIVA HANDIHALER) 18 MCG inhalation capsule Place 1 capsule (18 mcg total) into inhaler and inhale daily. 10/19/14  Yes Srikar Sudini, MD  zolpidem (AMBIEN) 5 MG tablet Take 1 tablet (5 mg total) by mouth at bedtime as needed for sleep. 04/11/15  Yes Kerman PasseyMelinda P Lada, MD      VITAL SIGNS:  Blood pressure 142/86, pulse 102, temperature 97.7 F (36.5 C), temperature source Oral, resp. rate 17, height 5\' 10"  (1.778 m), weight 81.647 kg (180 lb), SpO2 93 %.  PHYSICAL EXAMINATION:  GENERAL:  60  y.o.-year-old patient lying in the bed with no acute distress.  EYES: Pupils equal, round, reactive to light and accommodation. No scleral icterus. Extraocular muscles intact.  HEENT: Head atraumatic, normocephalic. Oropharynx and nasopharynx clear.  NECK:  Supple, no jugular venous distention. No thyroid enlargement, no tenderness.  LUNGS: Faint expiratory wheezing bilaterally. No rales,rhonchi or crepitation. No use of accessory muscles of respiration.  CARDIOVASCULAR: S1, S2 normal. No murmurs, rubs, or gallops.  ABDOMEN: Soft, nontender, nondistended. Bowel sounds present. No organomegaly or mass.  EXTREMITIES: No pedal edema, cyanosis, or clubbing.  NEUROLOGIC: Cranial nerves II through XII are intact. Muscle strength 5/5 in all extremities. Sensation intact. Gait not checked.  PSYCHIATRIC: The patient is alert and oriented x 3.  SKIN: No obvious rash, lesion, or ulcer.   LABORATORY PANEL:   CBC  Recent Labs Lab 06/27/15 1122  WBC 9.1  HGB 15.0  HCT 44.3  PLT 203   ------------------------------------------------------------------------------------------------------------------  Chemistries   Recent Labs Lab 06/27/15 1122  NA 139  K 4.2  CL 100*  CO2 29  GLUCOSE 123*  BUN 8  CREATININE 0.73  CALCIUM 9.4   ------------------------------------------------------------------------------------------------------------------  Cardiac Enzymes  Recent Labs Lab 06/27/15 1122  TROPONINI <0.03   ------------------------------------------------------------------------------------------------------------------  RADIOLOGY:  Dg Chest Port 1 View  06/27/2015  CLINICAL DATA:  Shortness of breath for 4 days EXAM: PORTABLE CHEST 1 VIEW COMPARISON:  10/17/2014 FINDINGS: Hyperinflation consistent with COPD. The heart size and vascular pattern are normal. No consolidation or effusion. IMPRESSION: No acute findings Electronically Signed   By: Esperanza Heir M.D.   On: 06/27/2015  11:55    EKG:   Orders placed or performed during the hospital encounter of 06/27/15  . ED EKG  . ED EKG  . EKG 12-Lead  . EKG 12-Lead   sinus tachycardia 1 11 bpm  IMPRESSION AND PLAN:  #1 acute on chronic respiratory failure secondary to COPD exacerbation: Admit, continue oxygen to keep sats more than 90%, continue D on nebs every 4 hours, IV Solu-Medrol, empiric antibiotics with Levaquin. Follow closely secondary to severe emphysema and advanced lung disease. #2 GERD: Continue PPIs. #3 hypertension: 4, anxiety: Continue Klonopin 5 chronic respiratory failure, emphysema: Continue theophylline that he was taking before, patient follows up with pulmonology Dr. Meredeth Ide.  All the records are reviewed and case discussed with ED provider. Management plans discussed with the patient, family and they are in agreement.  CODE STATUS: Full  TOTAL TIME TAKING CARE OF THIS PATIENT: .    Katha Hamming M.D on 06/27/2015 at 2:37 PM  Between 7am to 6pm - Pager - [phone]  After 6pm go to [email or web address] - password [email or web address]  [facility]  Office  [phone]  CC: Primary care physician; Baruch Gouty, MD  Note: This dictation was prepared with Dragon dictation along with smaller phrase technology. Any transcriptional errors that result from this process are unintentional.

## 2015-06-27 NOTE — Progress Notes (Signed)
Pt c/o of cough and requesting meds. Dr.Willis notified and orders placed.

## 2015-06-28 LAB — BASIC METABOLIC PANEL WITH GFR
Anion gap: 12 (ref 5–15)
BUN: 10 mg/dL (ref 6–20)
CO2: 28 mmol/L (ref 22–32)
Calcium: 9.4 mg/dL (ref 8.9–10.3)
Chloride: 99 mmol/L — ABNORMAL LOW (ref 101–111)
Creatinine, Ser: 0.77 mg/dL (ref 0.61–1.24)
GFR calc Af Amer: >60 mL/min
GFR calc non Af Amer: >60 mL/min
Glucose, Bld: 128 mg/dL — ABNORMAL HIGH (ref 65–99)
Potassium: 3.8 mmol/L (ref 3.5–5.1)
Sodium: 139 mmol/L (ref 135–145)

## 2015-06-28 LAB — CBC
HCT: 42.6 % (ref 40.0–52.0)
Hemoglobin: 14.6 g/dL (ref 13.0–18.0)
MCH: 35.7 pg — ABNORMAL HIGH (ref 26.0–34.0)
MCHC: 34.3 g/dL (ref 32.0–36.0)
MCV: 104.3 fL — ABNORMAL HIGH (ref 80.0–100.0)
Platelets: 188 K/uL (ref 150–440)
RBC: 4.09 MIL/uL — ABNORMAL LOW (ref 4.40–5.90)
RDW: 14.9 % — ABNORMAL HIGH (ref 11.5–14.5)
WBC: 10.7 K/uL — ABNORMAL HIGH (ref 3.8–10.6)

## 2015-06-28 LAB — GLUCOSE, CAPILLARY: GLUCOSE-CAPILLARY: 132 mg/dL — AB (ref 65–99)

## 2015-06-28 MED ORDER — CLONAZEPAM 0.5 MG PO TABS
0.5000 mg | ORAL_TABLET | Freq: Two times a day (BID) | ORAL | Status: DC | PRN
  Administered 2015-06-28 (×2): 0.5 mg via ORAL
  Filled 2015-06-28: qty 1

## 2015-06-28 MED ORDER — ENOXAPARIN SODIUM 40 MG/0.4ML ~~LOC~~ SOLN
40.0000 mg | SUBCUTANEOUS | Status: DC
  Administered 2015-06-28: 40 mg via SUBCUTANEOUS
  Filled 2015-06-28: qty 0.4

## 2015-06-28 MED ORDER — IPRATROPIUM-ALBUTEROL 0.5-2.5 (3) MG/3ML IN SOLN: 3.0000 mL | Freq: Four times a day (QID) | RESPIRATORY_TRACT | Status: DC | PRN

## 2015-06-28 MED ORDER — IPRATROPIUM-ALBUTEROL 0.5-2.5 (3) MG/3ML IN SOLN
3.0000 mL | Freq: Three times a day (TID) | RESPIRATORY_TRACT | Status: DC
  Administered 2015-06-28 – 2015-06-29 (×2): 3 mL via RESPIRATORY_TRACT
  Filled 2015-06-28 (×2): qty 3

## 2015-06-28 MED ORDER — LEVOFLOXACIN 500 MG PO TABS
500.0000 mg | ORAL_TABLET | Freq: Every day | ORAL | Status: DC
  Administered 2015-06-28: 500 mg via ORAL
  Filled 2015-06-28 (×2): qty 1

## 2015-06-28 MED ORDER — IPRATROPIUM-ALBUTEROL 0.5-2.5 (3) MG/3ML IN SOLN: 3.0000 mL | Freq: Four times a day (QID) | RESPIRATORY_TRACT | Status: DC

## 2015-06-28 NOTE — Progress Notes (Signed)
Pt. Scored a 4 on the RT assessment protocol. The nebulizer treatments will be changed to TID and Q4 prn.

## 2015-06-28 NOTE — Progress Notes (Signed)
[facility] - [place] at [facility]   PATIENT NAME: Devin HusbandsDavid Gapinski    MR#:  [id number]  DATE OF BIRTH:  06-10-1955  SUBJECTIVE:  CHIEF COMPLAINT:   Chief Complaint  Patient presents with  . Shortness of Breath   Still cough, wheezing and shortness of breath REVIEW OF SYSTEMS:  CONSTITUTIONAL: No fever, fatigue or weakness.  EYES: No blurred or double vision.  EARS, NOSE, AND THROAT: No tinnitus or ear pain.  RESPIRATORY: has cough, shortness of breath, wheezing, no hemoptysis.  CARDIOVASCULAR: No chest pain, orthopnea, edema.  GASTROINTESTINAL: No nausea, vomiting, diarrhea or abdominal pain.  GENITOURINARY: No dysuria, hematuria.  ENDOCRINE: No polyuria, nocturia,  HEMATOLOGY: No anemia, easy bruising or bleeding SKIN: No rash or lesion. MUSCULOSKELETAL: No joint pain or arthritis.   NEUROLOGIC: No tingling, numbness, weakness.  PSYCHIATRY: No anxiety or depression.   DRUG ALLERGIES:  No Known Allergies  VITALS:  Blood pressure 140/78, pulse 99, temperature 97 F (36.1 C), temperature source Oral, resp. rate 20, height 5\' 10"  (1.778 m), weight 82.827 kg (182 lb 9.6 oz), SpO2 94 %.  PHYSICAL EXAMINATION:  GENERAL:  60 y.o.-year-old patient lying in the bed with no acute distress.  EYES: Pupils equal, round, reactive to light and accommodation. No scleral icterus. Extraocular muscles intact.  HEENT: Head atraumatic, normocephalic. Oropharynx and nasopharynx clear.  NECK:  Supple, no jugular venous distention. No thyroid enlargement, no tenderness.  LUNGS:  diminished breath sounds bilaterally, expiratory  wheezing, no rales,rhonchi or crepitation. No use of accessory muscles of respiration.  CARDIOVASCULAR: S1, S2 normal. No murmurs, rubs, or gallops.  ABDOMEN: Soft, nontender, nondistended. Bowel sounds present. No organomegaly or mass.  EXTREMITIES: No pedal edema, cyanosis, or clubbing.  NEUROLOGIC: Cranial nerves II through XII are intact.  Muscle strength 5/5 in all extremities. Sensation intact. Gait not checked.  PSYCHIATRIC: The patient is alert and oriented x 3.  SKIN: No obvious rash, lesion, or ulcer.    LABORATORY PANEL:   CBC  Recent Labs Lab 06/28/15 0354  WBC 10.7*  HGB 14.6  HCT 42.6  PLT 188   ------------------------------------------------------------------------------------------------------------------  Chemistries   Recent Labs Lab 06/28/15 0354  NA 139  K 3.8  CL 99*  CO2 28  GLUCOSE 128*  BUN 10  CREATININE 0.77  CALCIUM 9.4   ------------------------------------------------------------------------------------------------------------------  Cardiac Enzymes  Recent Labs Lab 06/27/15 1122  TROPONINI <0.03   ------------------------------------------------------------------------------------------------------------------  RADIOLOGY:  Dg Chest Port 1 View  06/27/2015  CLINICAL DATA:  Shortness of breath for 4 days EXAM: PORTABLE CHEST 1 VIEW COMPARISON:  10/17/2014 FINDINGS: Hyperinflation consistent with COPD. The heart size and vascular pattern are normal. No consolidation or effusion. IMPRESSION: No acute findings Electronically Signed   By: Esperanza Heiraymond  Rubner M.D.   On: 06/27/2015 11:55    EKG:   Orders placed or performed during the hospital encounter of 06/27/15  . ED EKG  . ED EKG  . EKG 12-Lead  . EKG 12-Lead    ASSESSMENT AND PLAN:   #1 acute on chronic respiratory failure secondary to COPD exacerbation and emphysema: Continue oxygen by nasal cannula, duonebs every 4 hours, IV Solu-Medrol, Levaquin. Continue theophylline and Spiriva. Robitussin when necessary.   #2 GERD: Continue PPIs. #3 hypertension: 4, anxiety: Continue Klonopin when necessary.    tobacco abuse. Smoking cessation was counseled, give nicotine patch.    All the records are reviewed and case discussed with Care Management/Social Workerr. Management plans discussed with the patient, family and  they are in agreement.  CODE STATUS: full code   TOTAL TIME TAKING CARE OF THIS PATIENT:39 minutes.  Greater than 50% time was spent on coordination of care and face-to-face counseling.  POSSIBLE D/C IN 3 DAYS, DEPENDING ON CLINICAL CONDITION.   Shaune Pollack M.D on 06/28/2015 at 2:02 PM  Between 7am to 6pm - Pager - [phone]  After 6pm go to [email or web address] - password [email or web address] [facility]  [place] [facility]  Office  [phone]  CC: Primary care physician; Baruch Gouty, MD

## 2015-06-29 LAB — GLUCOSE, CAPILLARY: Glucose-Capillary: 136 mg/dL — ABNORMAL HIGH (ref 65–99)

## 2015-06-29 MED ORDER — GUAIFENESIN-CODEINE 100-10 MG/5ML PO SOLN: 5.0000 mL | ORAL | Status: DC | PRN

## 2015-06-29 MED ORDER — ZOLPIDEM TARTRATE 5 MG PO TABS: 5.0000 mg | ORAL_TABLET | Freq: Every evening | ORAL | Status: DC | PRN

## 2015-06-29 MED ORDER — PREDNISONE 10 MG PO TABS: ORAL_TABLET | ORAL | Status: DC

## 2015-06-29 NOTE — Discharge Summary (Signed)
[facility] Physicians - [place] at [facility]   PATIENT NAME: Devin Bryant    MR#:  [id number]  DATE OF BIRTH:  11/08/1955  DATE OF ADMISSION:  06/27/2015 ADMITTING PHYSICIAN: Katha Hamming, MD  DATE OF DISCHARGE: 06/29/2015 11:34 AM  PRIMARY CARE PHYSICIAN: Baruch Gouty, MD    ADMISSION DIAGNOSIS:  Hypoxia [R09.02] COPD exacerbation (HCC) [J44.1]   DISCHARGE DIAGNOSIS:    acute on chronic respiratory failure secondary to COPD exacerbation and emphysema SECONDARY DIAGNOSIS:   Past Medical History  Diagnosis Date  . Allergy   . Hypertension   . COPD (chronic obstructive pulmonary disease) (HCC)   . GERD (gastroesophageal reflux disease)   . Adjustment disorder with depressed mood   . ED (erectile dysfunction)   . Tobacco use   . Hypertriglyceridemia   . Elevated glucose   . Elevated serum glutamic pyruvic transaminase (SGPT) level   . Anxiety   . Emphysema lung [facility])     HOSPITAL COURSE:   #1 acute on chronic respiratory failure secondary to COPD exacerbation and emphysema: He was treated with oxygen by nasal cannula, duonebs every 4 hours, IV Solu-Medrol, Levaquin. Continue theophylline and Spiriva. Robitussin when necessary. His symptoms is much better, oxygen by nasal cannular was weaned down to 2 L by nasal cannula.  IV Solu-Medrol was discontinued and changed to prednisone taper.  #2 GERD: Continue PPIs. #3 hypertension: 4, anxiety: Continue Klonopin when necessary.   tobacco abuse. Smoking cessation was counseled, give nicotine patch.   DISCHARGE CONDITIONS:   Stable, discharged home today.  CONSULTS OBTAINED:     DRUG ALLERGIES:  No Known Allergies  DISCHARGE MEDICATIONS:   Discharge Medication List as of 06/29/2015  8:53 AM    START taking these medications   Details  guaiFENesin-codeine 100-10 MG/5ML syrup Take 5 mLs by mouth every 4 (four) hours as needed for cough., Starting 06/29/2015, Until Discontinued, Print     predniSONE (DELTASONE) 10 MG tablet 40 mg po daily for 2 days, 20 mg po daily for 2 days, 10 mg po daily for 2 days,, Print      CONTINUE these medications which have CHANGED   Details  zolpidem (AMBIEN) 5 MG tablet Take 1 tablet (5 mg total) by mouth at bedtime as needed for sleep., Starting 06/29/2015, Until Discontinued, Print      CONTINUE these medications which have NOT CHANGED   Details  ADVAIR HFA 115-21 MCG/ACT inhaler Inhale 2 puffs into the lungs 2 (two) times daily. , Starting 10/11/2014, Until Discontinued, Historical Med    albuterol (PROVENTIL HFA;VENTOLIN HFA) 108 (90 BASE) MCG/ACT inhaler Inhale 2 puffs into the lungs every 6 (six) hours as needed for wheezing or shortness of breath. , Until Discontinued, Historical Med    albuterol (PROVENTIL) (2.5 MG/3ML) 0.083% nebulizer solution Take 3 mLs (2.5 mg total) by nebulization every 4 (four) hours as needed for wheezing or shortness of breath., Starting 10/15/2014, Until Discontinued, Normal    Cetirizine HCl (ZYRTEC ALLERGY) 10 MG CAPS Take 10 mg by mouth daily., Until Discontinued, Historical Med    Clobetasol Propionate 0.05 % shampoo Apply 1 application topically as needed. , Starting 03/31/2015, Until Discontinued, Historical Med    clonazePAM (KLONOPIN) 0.5 MG tablet Take 1 tablet (0.5 mg total) by mouth 2 (two) times daily as needed for anxiety., Starting 04/11/2015, Until Discontinued, Print    CORDRAN 0.05 % lotion Apply 1 application topically 2 (two) times daily. Reported on 04/04/2015, Starting 03/09/2015, Until Discontinued, Historical Med  Desoximetasone (TOPICORT SPRAY EX) Apply 1 application topically 2 (two) times daily as needed (scorisis/irritation over body. Use all over except for on face.). Reported on 04/04/2015, Until Discontinued, Historical Med    esomeprazole (NEXIUM) 20 MG capsule Take 20 mg by mouth daily at 12 noon., Until Discontinued, Historical Med    fluticasone (FLONASE) 50 MCG/ACT nasal spray  Place 1 spray into both nostrils daily. , Starting 07/23/2014, Until Discontinued, Historical Med    gabapentin (NEURONTIN) 300 MG capsule Take 300 mg by mouth 2 (two) times daily., Starting 03/22/2015, Until Discontinued, Historical Med    ketoconazole (NIZORAL) 2 % cream 1 APPLICATION APPLY ON THE SKIN DAILY, Historical Med    mometasone (ELOCON) 0.1 % ointment Apply 1 application topically daily as needed (for ear irritation/eczema in ear). Apply to each ear 1 drop daily as needed, Until Discontinued, Historical Med    Multiple Vitamin (MULTIVITAMIN) tablet Take 1 tablet by mouth daily., Until Discontinued, Historical Med    nystatin (MYCOSTATIN) 100000 UNIT/ML suspension Take 5 mLs by mouth 4 (four) times daily as needed. thrush, Starting 02/08/2015, Until Discontinued, Historical Med    PARoxetine (PAXIL) 20 MG tablet Take 20 mg by mouth daily., Until Discontinued, Historical Med    theophylline (THEODUR) 300 MG 12 hr tablet Take 300 mg by mouth 2 (two) times daily., Starting 03/02/2015, Until Discontinued, Historical Med    tiotropium (SPIRIVA HANDIHALER) 18 MCG inhalation capsule Place 1 capsule (18 mcg total) into inhaler and inhale daily., Starting 10/19/2014, Until Discontinued, Normal    aspirin EC 81 MG tablet Take 81 mg by mouth daily. Reported on 06/27/2015, Until Discontinued, Historical Med         DISCHARGE INSTRUCTIONS:    If you experience worsening of your admission symptoms, develop shortness of breath, life threatening emergency, suicidal or homicidal thoughts you must seek medical attention immediately by calling 911 or calling your MD immediately  if symptoms less severe.  You Must read complete instructions/literature along with all the possible adverse reactions/side effects for all the Medicines you take and that have been prescribed to you. Take any new Medicines after you have completely understood and accept all the possible adverse reactions/side effects.    Please note  You were cared for by a hospitalist during your hospital stay. If you have any questions about your discharge medications or the care you received while you were in the hospital after you are discharged, you can call the unit and asked to speak with the hospitalist on call if the hospitalist that took care of you is not available. Once you are discharged, your primary care physician will handle any further medical issues. Please note that NO REFILLS for any discharge medications will be authorized once you are discharged, as it is imperative that you return to your primary care physician (or establish a relationship with a primary care physician if you do not have one) for your aftercare needs so that they can reassess your need for medications and monitor your lab values.    Today   SUBJECTIVE   Has cough, but no wheezing or shortness of breath. On oxygen and it cannula 2 L.   VITAL SIGNS:  Blood pressure 143/95, pulse 93, temperature 97.6 F (36.4 C), temperature source Oral, resp. rate 24, height  (1.778 m), weight 81.194 kg (179 lb), SpO2 90 %.  I/O:   Intake/Output Summary (Last 24 hours) at 06/29/15 1445 Last data filed at 06/29/15 0900  Gross per 24 hour  Intake    240 ml  Output   2550 ml  Net  -2310 ml    PHYSICAL EXAMINATION:  GENERAL:  60 y.o.-year-old patient lying in the bed with no acute distress.  EYES: Pupils equal, round, reactive to light and accommodation. No scleral icterus. Extraocular muscles intact.  HEENT: Head atraumatic, normocephalic. Oropharynx and nasopharynx clear.  NECK:  Supple, no jugular venous distention. No thyroid enlargement, no tenderness.  LUNGS: Diminished  breath sounds bilaterally, no wheezing, rales,rhonchi or crepitation. No use of accessory muscles of respiration.  CARDIOVASCULAR: S1, S2 normal. No murmurs, rubs, or gallops.  ABDOMEN: Soft, non-tender, non-distended. Bowel sounds present. No organomegaly or mass.   EXTREMITIES: No pedal edema, cyanosis, or clubbing.  NEUROLOGIC: Cranial nerves II through XII are intact. Muscle strength 5/5 in all extremities. Sensation intact. Gait not checked.  PSYCHIATRIC: The patient is alert and oriented x 3.  SKIN: No obvious rash, lesion, or ulcer.   DATA REVIEW:   CBC  Recent Labs Lab 06/28/15 0354  WBC 10.7*  HGB 14.6  HCT 42.6  PLT 188    Chemistries   Recent Labs Lab 06/28/15 0354  NA 139  K 3.8  CL 99*  CO2 28  GLUCOSE 128*  BUN 10  CREATININE 0.77  CALCIUM 9.4    Cardiac Enzymes  Recent Labs Lab 06/27/15 1122  TROPONINI <0.03    Microbiology Results  No results found for this or any previous visit.  RADIOLOGY:  No results found.      Management plans discussed with the patient, family and they are in agreement.  CODE STATUS:  Code Status History    Date Active Date Inactive Code Status Order ID Comments User Context   06/27/2015  2:36 PM 06/29/2015  2:34 PM Full Code [id number]  Katha HammingSnehalatha Konidena, MD ED   03/11/2015  5:29 PM 03/14/2015  3:59 PM Full Code [id number]  Dorothea OgleIskra M Myers, MD Inpatient   10/17/2014  2:31 PM 10/19/2014  2:34 PM Full Code [id number]  Shaune PollackQing Karam Dunson, MD Inpatient      TOTAL TIME TAKING CARE OF THIS PATIENT:35 minutes.    Shaune Pollackhen, Natan Hartog M.D on 06/29/2015 at 2:45 PM  Between 7am to 6pm - Pager - [phone]  After 6pm go to [email or web address] - password [email or web address] [facility]  [place] [facility] [place] [facility]  Office  [phone]  CC: Primary care physician; Baruch GoutyMelinda Lada, MD

## 2015-06-29 NOTE — Progress Notes (Signed)
IV was removed. Discharge instructions, follow-up appointments, and prescriptions were provided to the pt. All questions answered. The pt walked downstairs to meet daughter.

## 2015-06-29 NOTE — Discharge Instructions (Signed)
Heart healthy diet. °Activity as tolerated. °Continue home O2 [place] 2L. °

## 2015-06-29 NOTE — Care Management (Signed)
Patient admitted with acute on chronic respiratory failure. Patient to be discharged today.  Patient states that he ah chronic O2 through [employer].  Patient has portable tanks at bedside for discharge.

## 2015-07-12 ENCOUNTER — Ambulatory Visit: Payer: BLUE CROSS/BLUE SHIELD | Admitting: Family Medicine

## 2015-08-19 ENCOUNTER — Encounter: Payer: BLUE CROSS/BLUE SHIELD | Admitting: Family Medicine

## 2015-08-22 ENCOUNTER — Other Ambulatory Visit: Payer: Self-pay | Admitting: Family Medicine

## 2015-08-22 ENCOUNTER — Ambulatory Visit
Admission: RE | Admit: 2015-08-22 | Discharge: 2015-08-22 | Disposition: A | Discharge status: Home / Self Care | Payer: BLUE CROSS/BLUE SHIELD | Source: Ambulatory Visit | Attending: Family Medicine | Admitting: Family Medicine

## 2015-08-22 DIAGNOSIS — M7989 Other specified soft tissue disorders: Secondary | ICD-10-CM

## 2015-10-05 ENCOUNTER — Emergency Department: Payer: BLUE CROSS/BLUE SHIELD

## 2015-10-05 ENCOUNTER — Encounter: Payer: Self-pay | Admitting: Emergency Medicine

## 2015-10-05 ENCOUNTER — Inpatient Hospital Stay
Admission: EM | Admit: 2015-10-05 | Discharge: 2015-10-10 | DRG: 433 | Disposition: A | Discharge status: Home Health | Payer: BLUE CROSS/BLUE SHIELD | Attending: Internal Medicine | Admitting: Internal Medicine

## 2015-10-05 DIAGNOSIS — Z811 Family history of alcohol abuse and dependence: Secondary | ICD-10-CM

## 2015-10-05 DIAGNOSIS — Z7982 Long term (current) use of aspirin: Secondary | ICD-10-CM

## 2015-10-05 DIAGNOSIS — K7011 Alcoholic hepatitis with ascites: Secondary | ICD-10-CM | POA: Diagnosis present

## 2015-10-05 DIAGNOSIS — I248 Other forms of acute ischemic heart disease: Secondary | ICD-10-CM | POA: Diagnosis present

## 2015-10-05 DIAGNOSIS — E785 Hyperlipidemia, unspecified: Secondary | ICD-10-CM | POA: Diagnosis present

## 2015-10-05 DIAGNOSIS — Z833 Family history of diabetes mellitus: Secondary | ICD-10-CM | POA: Diagnosis not present

## 2015-10-05 DIAGNOSIS — K219 Gastro-esophageal reflux disease without esophagitis: Secondary | ICD-10-CM | POA: Diagnosis present

## 2015-10-05 DIAGNOSIS — R778 Other specified abnormalities of plasma proteins: Secondary | ICD-10-CM

## 2015-10-05 DIAGNOSIS — F17210 Nicotine dependence, cigarettes, uncomplicated: Secondary | ICD-10-CM | POA: Diagnosis present

## 2015-10-05 DIAGNOSIS — K7460 Unspecified cirrhosis of liver: Secondary | ICD-10-CM | POA: Diagnosis present

## 2015-10-05 DIAGNOSIS — L409 Psoriasis, unspecified: Secondary | ICD-10-CM | POA: Diagnosis present

## 2015-10-05 DIAGNOSIS — R531 Weakness: Secondary | ICD-10-CM

## 2015-10-05 DIAGNOSIS — F329 Major depressive disorder, single episode, unspecified: Secondary | ICD-10-CM

## 2015-10-05 DIAGNOSIS — E860 Dehydration: Secondary | ICD-10-CM | POA: Diagnosis present

## 2015-10-05 DIAGNOSIS — E871 Hypo-osmolality and hyponatremia: Secondary | ICD-10-CM | POA: Diagnosis present

## 2015-10-05 DIAGNOSIS — G4700 Insomnia, unspecified: Secondary | ICD-10-CM | POA: Diagnosis present

## 2015-10-05 DIAGNOSIS — B370 Candidal stomatitis: Secondary | ICD-10-CM | POA: Diagnosis present

## 2015-10-05 DIAGNOSIS — R17 Unspecified jaundice: Secondary | ICD-10-CM | POA: Diagnosis present

## 2015-10-05 DIAGNOSIS — K7682 Hepatic encephalopathy: Secondary | ICD-10-CM

## 2015-10-05 DIAGNOSIS — F32A Depression, unspecified: Secondary | ICD-10-CM

## 2015-10-05 DIAGNOSIS — Z8249 Family history of ischemic heart disease and other diseases of the circulatory system: Secondary | ICD-10-CM | POA: Diagnosis not present

## 2015-10-05 DIAGNOSIS — Z825 Family history of asthma and other chronic lower respiratory diseases: Secondary | ICD-10-CM

## 2015-10-05 DIAGNOSIS — F411 Generalized anxiety disorder: Secondary | ICD-10-CM | POA: Diagnosis present

## 2015-10-05 DIAGNOSIS — F10239 Alcohol dependence with withdrawal, unspecified: Secondary | ICD-10-CM

## 2015-10-05 DIAGNOSIS — K760 Fatty (change of) liver, not elsewhere classified: Secondary | ICD-10-CM | POA: Diagnosis present

## 2015-10-05 DIAGNOSIS — F419 Anxiety disorder, unspecified: Secondary | ICD-10-CM

## 2015-10-05 DIAGNOSIS — J449 Chronic obstructive pulmonary disease, unspecified: Secondary | ICD-10-CM | POA: Diagnosis present

## 2015-10-05 DIAGNOSIS — F418 Other specified anxiety disorders: Secondary | ICD-10-CM | POA: Diagnosis present

## 2015-10-05 DIAGNOSIS — Z801 Family history of malignant neoplasm of trachea, bronchus and lung: Secondary | ICD-10-CM

## 2015-10-05 DIAGNOSIS — E876 Hypokalemia: Secondary | ICD-10-CM | POA: Diagnosis present

## 2015-10-05 DIAGNOSIS — K7290 Hepatic failure, unspecified without coma: Secondary | ICD-10-CM | POA: Diagnosis present

## 2015-10-05 DIAGNOSIS — E872 Acidosis, unspecified: Secondary | ICD-10-CM

## 2015-10-05 DIAGNOSIS — I10 Essential (primary) hypertension: Secondary | ICD-10-CM | POA: Diagnosis present

## 2015-10-05 DIAGNOSIS — Z79899 Other long term (current) drug therapy: Secondary | ICD-10-CM | POA: Diagnosis not present

## 2015-10-05 DIAGNOSIS — Z809 Family history of malignant neoplasm, unspecified: Secondary | ICD-10-CM

## 2015-10-05 DIAGNOSIS — R7989 Other specified abnormal findings of blood chemistry: Secondary | ICD-10-CM

## 2015-10-05 DIAGNOSIS — Z716 Tobacco abuse counseling: Secondary | ICD-10-CM

## 2015-10-05 LAB — CBC
HEMATOCRIT: 36.7 % — AB (ref 40.0–52.0)
HEMOGLOBIN: 12.7 g/dL — AB (ref 13.0–18.0)
MCH: 41.3 pg — AB (ref 26.0–34.0)
MCHC: 34.5 g/dL (ref 32.0–36.0)
MCV: 119.7 fL — AB (ref 80.0–100.0)
Platelets: 165 10*3/uL (ref 150–440)
RBC: 3.07 MIL/uL — AB (ref 4.40–5.90)
RDW: 18.5 % — ABNORMAL HIGH (ref 11.5–14.5)
WBC: 13.1 10*3/uL — ABNORMAL HIGH (ref 3.8–10.6)

## 2015-10-05 LAB — COMPREHENSIVE METABOLIC PANEL
ALT: 237 U/L — ABNORMAL HIGH (ref 17–63)
ANION GAP: 15 (ref 5–15)
AST: 435 U/L — ABNORMAL HIGH (ref 15–41)
Albumin: 2.7 g/dL — ABNORMAL LOW (ref 3.5–5.0)
Alkaline Phosphatase: 353 U/L — ABNORMAL HIGH (ref 38–126)
BUN: <5 mg/dL — ABNORMAL LOW (ref 6–20)
CHLORIDE: 89 mmol/L — AB (ref 101–111)
CO2: 26 mmol/L (ref 22–32)
Calcium: 9 mg/dL (ref 8.9–10.3)
Creatinine, Ser: <0.3 mg/dL — ABNORMAL LOW (ref 0.61–1.24)
Glucose, Bld: 126 mg/dL — ABNORMAL HIGH (ref 65–99)
Potassium: 2.7 mmol/L — CL (ref 3.5–5.1)
SODIUM: 130 mmol/L — AB (ref 135–145)
Total Bilirubin: 23.9 mg/dL (ref 0.3–1.2)
Total Protein: 7.3 g/dL (ref 6.5–8.1)

## 2015-10-05 LAB — URINALYSIS COMPLETE WITH MICROSCOPIC (ARMC ONLY)
Bacteria, UA: NONE SEEN
RBC / HPF: NONE SEEN RBC/hpf (ref 0–5)
Specific Gravity, Urine: 1.025 (ref 1.005–1.030)

## 2015-10-05 LAB — LACTIC ACID, PLASMA
LACTIC ACID, VENOUS: 2 mmol/L — AB (ref 0.5–1.9)
Lactic Acid, Venous: 1.5 mmol/L (ref 0.5–1.9)

## 2015-10-05 LAB — TROPONIN I
Troponin I: 0.06 ng/mL (ref <0.03)
Troponin I: 0.03 ng/mL (ref <0.03)
Troponin I: 0.04 ng/mL (ref <0.03)

## 2015-10-05 LAB — PROTIME-INR
INR: 0.98
Prothrombin Time: 13 seconds (ref 11.4–15.2)

## 2015-10-05 LAB — ETHANOL

## 2015-10-05 LAB — THEOPHYLLINE LEVEL: Theophylline Lvl: 7 — ABNORMAL LOW (ref 8.0–20.0)

## 2015-10-05 LAB — MAGNESIUM: Magnesium: 1.3 mg/dL — ABNORMAL LOW (ref 1.7–2.4)

## 2015-10-05 LAB — LIPASE, BLOOD: LIPASE: 66 U/L — AB (ref 11–51)

## 2015-10-05 LAB — APTT: aPTT: 33 seconds (ref 24–36)

## 2015-10-05 MED ORDER — FLUOCINONIDE 0.05 % EX OINT: TOPICAL_OINTMENT | Freq: Every day | CUTANEOUS | Status: DC | PRN

## 2015-10-05 MED ORDER — ZOLPIDEM TARTRATE 5 MG PO TABS
5.0000 mg | ORAL_TABLET | Freq: Every evening | ORAL | Status: DC | PRN
  Administered 2015-10-06 – 2015-10-07 (×2): 5 mg via ORAL
  Filled 2015-10-05 (×2): qty 1

## 2015-10-05 MED ORDER — LORAZEPAM 2 MG/ML IJ SOLN
1.0000 mg | Freq: Four times a day (QID) | INTRAMUSCULAR | Status: DC | PRN
  Administered 2015-10-06 – 2015-10-08 (×4): 1 mg via INTRAVENOUS
  Filled 2015-10-05 (×3): qty 1

## 2015-10-05 MED ORDER — FOLIC ACID 1 MG PO TABS
1.0000 mg | ORAL_TABLET | Freq: Every day | ORAL | Status: DC
  Administered 2015-10-05 – 2015-10-10 (×6): 1 mg via ORAL
  Filled 2015-10-05 (×7): qty 1

## 2015-10-05 MED ORDER — ADULT MULTIVITAMIN W/MINERALS CH
1.0000 | ORAL_TABLET | Freq: Every day | ORAL | Status: DC
  Administered 2015-10-05 – 2015-10-10 (×6): 1 via ORAL
  Filled 2015-10-05 (×7): qty 1

## 2015-10-05 MED ORDER — NICOTINE 14 MG/24HR TD PT24
14.0000 mg | MEDICATED_PATCH | Freq: Every day | TRANSDERMAL | Status: DC
  Administered 2015-10-05 – 2015-10-09 (×5): 14 mg via TRANSDERMAL
  Filled 2015-10-05 (×6): qty 1

## 2015-10-05 MED ORDER — VITAMIN B-1 100 MG PO TABS
100.0000 mg | ORAL_TABLET | Freq: Every day | ORAL | Status: DC
  Administered 2015-10-05 – 2015-10-10 (×5): 100 mg via ORAL
  Filled 2015-10-05 (×5): qty 1

## 2015-10-05 MED ORDER — LORAZEPAM 2 MG/ML IJ SOLN
1.0000 mg | Freq: Four times a day (QID) | INTRAMUSCULAR | Status: DC | PRN
  Administered 2015-10-05: 1 mg via INTRAVENOUS
  Filled 2015-10-05: qty 1

## 2015-10-05 MED ORDER — MOMETASONE FUROATE 0.1 % EX OINT: 1.0000 "application " | TOPICAL_OINTMENT | Freq: Every day | CUTANEOUS | Status: DC | PRN

## 2015-10-05 MED ORDER — SODIUM CHLORIDE 0.9 % IV SOLN
INTRAVENOUS | Status: DC
  Administered 2015-10-05 – 2015-10-06 (×2): via INTRAVENOUS

## 2015-10-05 MED ORDER — MAGNESIUM SULFATE 2 GM/50ML IV SOLN: 2.0000 g | Freq: Once | INTRAVENOUS | Status: DC

## 2015-10-05 MED ORDER — PANTOPRAZOLE SODIUM 40 MG PO TBEC
40.0000 mg | DELAYED_RELEASE_TABLET | Freq: Every day | ORAL | Status: DC
  Administered 2015-10-05 – 2015-10-10 (×6): 40 mg via ORAL
  Filled 2015-10-05 (×6): qty 1

## 2015-10-05 MED ORDER — THEOPHYLLINE ER 300 MG PO TB12
300.0000 mg | ORAL_TABLET | Freq: Two times a day (BID) | ORAL | Status: DC
  Administered 2015-10-05 – 2015-10-10 (×10): 300 mg via ORAL
  Filled 2015-10-05 (×12): qty 1

## 2015-10-05 MED ORDER — TIOTROPIUM BROMIDE MONOHYDRATE 18 MCG IN CAPS
18.0000 ug | ORAL_CAPSULE | Freq: Every day | RESPIRATORY_TRACT | Status: DC
  Administered 2015-10-06 – 2015-10-10 (×5): 18 ug via RESPIRATORY_TRACT
  Filled 2015-10-05: qty 5

## 2015-10-05 MED ORDER — HEPARIN SODIUM (PORCINE) 5000 UNIT/ML IJ SOLN
5000.0000 [IU] | Freq: Three times a day (TID) | INTRAMUSCULAR | Status: DC
  Administered 2015-10-05 – 2015-10-07 (×6): 5000 [IU] via SUBCUTANEOUS
  Filled 2015-10-05 (×6): qty 1

## 2015-10-05 MED ORDER — POTASSIUM CHLORIDE 10 MEQ/100ML IV SOLN
10.0000 meq | INTRAVENOUS | Status: AC
  Administered 2015-10-05 (×4): 10 meq via INTRAVENOUS
  Filled 2015-10-05 (×5): qty 100

## 2015-10-05 MED ORDER — FLUTICASONE PROPIONATE 50 MCG/ACT NA SUSP
1.0000 | Freq: Every day | NASAL | Status: DC
  Administered 2015-10-06 – 2015-10-10 (×5): 1 via NASAL
  Filled 2015-10-05: qty 16

## 2015-10-05 MED ORDER — LORAZEPAM 1 MG PO TABS: 1.0000 mg | ORAL_TABLET | Freq: Four times a day (QID) | ORAL | Status: DC | PRN

## 2015-10-05 MED ORDER — LORAZEPAM 2 MG/ML IJ SOLN
0.5000 mg | Freq: Once | INTRAMUSCULAR | Status: DC
  Filled 2015-10-05 (×2): qty 1

## 2015-10-05 MED ORDER — POTASSIUM CHLORIDE 10 MEQ/100ML IV SOLN
10.0000 meq | Freq: Once | INTRAVENOUS | Status: AC
  Administered 2015-10-05: 23:00:00 10 meq via INTRAVENOUS
  Filled 2015-10-05: qty 100

## 2015-10-05 MED ORDER — NYSTATIN 100000 UNIT/ML MT SUSP
5.0000 mL | Freq: Four times a day (QID) | OROMUCOSAL | Status: DC
  Administered 2015-10-05 – 2015-10-10 (×17): 500000 [IU] via ORAL
  Filled 2015-10-05 (×16): qty 5

## 2015-10-05 MED ORDER — LORAZEPAM 1 MG PO TABS
1.0000 mg | ORAL_TABLET | ORAL | Status: DC | PRN
  Administered 2015-10-06 – 2015-10-08 (×3): 1 mg via ORAL
  Filled 2015-10-05 (×3): qty 1

## 2015-10-05 MED ORDER — POTASSIUM CHLORIDE CRYS ER 20 MEQ PO TBCR
40.0000 meq | EXTENDED_RELEASE_TABLET | Freq: Once | ORAL | Status: AC
  Administered 2015-10-05: 40 meq via ORAL
  Filled 2015-10-05: qty 2

## 2015-10-05 MED ORDER — ENOXAPARIN SODIUM 40 MG/0.4ML ~~LOC~~ SOLN: 40.0000 mg | SUBCUTANEOUS | Status: DC

## 2015-10-05 MED ORDER — CLONAZEPAM 0.5 MG PO TABS
0.5000 mg | ORAL_TABLET | Freq: Two times a day (BID) | ORAL | Status: DC | PRN
  Administered 2015-10-06 – 2015-10-07 (×3): 0.5 mg via ORAL
  Filled 2015-10-05 (×3): qty 1

## 2015-10-05 MED ORDER — ONE-DAILY MULTI VITAMINS PO TABS: 1.0000 | ORAL_TABLET | Freq: Every day | ORAL | Status: DC

## 2015-10-05 MED ORDER — MOMETASONE FURO-FORMOTEROL FUM 200-5 MCG/ACT IN AERO
2.0000 | INHALATION_SPRAY | Freq: Two times a day (BID) | RESPIRATORY_TRACT | Status: DC
  Administered 2015-10-05 – 2015-10-10 (×10): 2 via RESPIRATORY_TRACT
  Filled 2015-10-05: qty 8.8

## 2015-10-05 MED ORDER — MAGNESIUM SULFATE 2 GM/50ML IV SOLN
2.0000 g | Freq: Once | INTRAVENOUS | Status: AC
  Administered 2015-10-05: 2 g via INTRAVENOUS
  Filled 2015-10-05 (×2): qty 50

## 2015-10-05 MED ORDER — SODIUM CHLORIDE 0.9% FLUSH
3.0000 mL | Freq: Two times a day (BID) | INTRAVENOUS | Status: DC
  Administered 2015-10-05 – 2015-10-10 (×8): 3 mL via INTRAVENOUS

## 2015-10-05 MED ORDER — POTASSIUM CHLORIDE 10 MEQ/100ML IV SOLN
10.0000 meq | INTRAVENOUS | Status: DC
  Filled 2015-10-05 (×5): qty 100

## 2015-10-05 MED ORDER — LORATADINE 10 MG PO TABS
10.0000 mg | ORAL_TABLET | Freq: Every day | ORAL | Status: DC
  Administered 2015-10-06 – 2015-10-10 (×5): 10 mg via ORAL
  Filled 2015-10-05 (×5): qty 1

## 2015-10-05 MED ORDER — ALBUTEROL SULFATE (2.5 MG/3ML) 0.083% IN NEBU: 2.5000 mg | INHALATION_SOLUTION | RESPIRATORY_TRACT | Status: DC | PRN

## 2015-10-05 MED ORDER — THIAMINE HCL 100 MG/ML IJ SOLN
100.0000 mg | Freq: Every day | INTRAMUSCULAR | Status: DC
  Administered 2015-10-07: 11:00:00 100 mg via INTRAVENOUS
  Filled 2015-10-05: qty 2

## 2015-10-05 MED ORDER — PAROXETINE HCL 20 MG PO TABS
20.0000 mg | ORAL_TABLET | Freq: Every day | ORAL | Status: DC
  Administered 2015-10-05 – 2015-10-08 (×4): 20 mg via ORAL
  Filled 2015-10-05 (×5): qty 1

## 2015-10-05 MED ORDER — CLOBETASOL PROPIONATE 0.05 % EX SHAM: 1.0000 "application " | MEDICATED_SHAMPOO | CUTANEOUS | Status: DC | PRN

## 2015-10-05 MED ORDER — TRIAMCINOLONE ACETONIDE 0.1 % EX OINT
TOPICAL_OINTMENT | Freq: Every day | CUTANEOUS | Status: DC | PRN
  Filled 2015-10-05: qty 15

## 2015-10-05 NOTE — ED Notes (Signed)
DG at bedside. 

## 2015-10-05 NOTE — ED Notes (Signed)
Patient's daughter reports that patient has been drinking "heavily" for the past three years with an increase over the past 6 months.  States patient will most likely not admit to drinking and wishes that patient not be told she provided this information.

## 2015-10-05 NOTE — ED Provider Notes (Addendum)
[facility] Emergency Department Provider Note   ____________________________________________   First MD Initiated Contact with Patient 10/05/15 1206     (approximate)  I have reviewed the triage vital signs and the nursing notes.   HISTORY  Chief Complaint abdominal distention and Anorexia    HPI Devin Bryant is a 60 y.o. male who comes in complaining of jaundiced for a week not feeling well for about 4 weeks and a lot of belly distention. He also reports his right foot has been swollen and then getting better off and on for approximately a year. Patient reports his stool was white about a day ago.   Past Medical History:  Diagnosis Date  . Adjustment disorder with depressed mood   . Allergy   . Anxiety   . COPD (chronic obstructive pulmonary disease) (HCC)   . ED (erectile dysfunction)   . Elevated glucose   . Elevated serum glutamic pyruvic transaminase (SGPT) level   . Emphysema lung (HCC)   . GERD (gastroesophageal reflux disease)   . Hypertension   . Hypertriglyceridemia   . Tobacco use     Patient Active Problem List   Diagnosis Date Noted  . Acute on chronic respiratory failure (HCC) 06/27/2015  . Macrocytosis without anemia 04/11/2015  . Alcoholism in recovery (HCC) 04/04/2015  . Alcohol withdrawal (HCC) 03/11/2015  . COPD exacerbation (HCC) 10/19/2014  . Tobacco abuse 10/19/2014  . Anxiety disorder 10/17/2014  . COPD with acute exacerbation (HCC) 10/15/2014  . Allergy   . Hypertension   . COPD (chronic obstructive pulmonary disease) (HCC)   . Adjustment disorder with depressed mood   . ED (erectile dysfunction)   . Tobacco use   . Hypertriglyceridemia   . Elevated glucose   . Elevated serum glutamic pyruvic transaminase (SGPT) level     Past Surgical History:  Procedure Laterality Date  . KNEE SURGERY     arthroscopic  . SEPTOPLASTY  Feb 2016    Prior to Admission medications   Medication Sig Start Date End Date  Taking? Authorizing Provider  ADVAIR HFA 115-21 MCG/ACT inhaler Inhale 2 puffs into the lungs 2 (two) times daily.  10/11/14   Historical Provider, MD  albuterol (PROVENTIL HFA;VENTOLIN HFA) 108 (90 BASE) MCG/ACT inhaler Inhale 2 puffs into the lungs every 6 (six) hours as needed for wheezing or shortness of breath.     Historical Provider, MD  albuterol (PROVENTIL) (2.5 MG/3ML) 0.083% nebulizer solution Take 3 mLs (2.5 mg total) by nebulization every 4 (four) hours as needed for wheezing or shortness of breath. 10/15/14   Kerman Passey, MD  aspirin EC 81 MG tablet Take 81 mg by mouth daily. Reported on 06/27/2015    Historical Provider, MD  Cetirizine HCl (ZYRTEC ALLERGY) 10 MG CAPS Take 10 mg by mouth daily.    Historical Provider, MD  Clobetasol Propionate 0.05 % shampoo Apply 1 application topically as needed.  03/31/15   Historical Provider, MD  clonazePAM (KLONOPIN) 0.5 MG tablet Take 1 tablet (0.5 mg total) by mouth 2 (two) times daily as needed for anxiety. 04/11/15   Kerman Passey, MD  CORDRAN 0.05 % lotion Apply 1 application topically 2 (two) times daily. Reported on 04/04/2015 03/09/15   Historical Provider, MD  Desoximetasone (TOPICORT SPRAY EX) Apply 1 application topically 2 (two) times daily as needed (scorisis/irritation over body. Use all over except for on face.). Reported on 04/04/2015    Historical Provider, MD  esomeprazole (NEXIUM) 20 MG capsule Take  20 mg by mouth daily at 12 noon.    Historical Provider, MD  fluticasone (FLONASE) 50 MCG/ACT nasal spray Place 1 spray into both nostrils daily.  07/23/14   Historical Provider, MD  gabapentin (NEURONTIN) 300 MG capsule Take 300 mg by mouth 2 (two) times daily. 03/22/15   Historical Provider, MD  guaiFENesin-codeine 100-10 MG/5ML syrup Take 5 mLs by mouth every 4 (four) hours as needed for cough. 06/29/15   Shaune Pollack, MD  ketoconazole (NIZORAL) 2 % cream 1 APPLICATION APPLY ON THE SKIN DAILY 03/30/15   Historical Provider, MD  mometasone  (ELOCON) 0.1 % ointment Apply 1 application topically daily as needed (for ear irritation/eczema in ear). Apply to each ear 1 drop daily as needed    Historical Provider, MD  Multiple Vitamin (MULTIVITAMIN) tablet Take 1 tablet by mouth daily.    Historical Provider, MD  nystatin (MYCOSTATIN) 100000 UNIT/ML suspension Take 5 mLs by mouth 4 (four) times daily as needed. thrush 02/08/15   Historical Provider, MD  PARoxetine (PAXIL) 20 MG tablet Take 20 mg by mouth daily.    Historical Provider, MD  predniSONE (DELTASONE) 10 MG tablet 40 mg po daily for 2 days, 20 mg po daily for 2 days, 10 mg po daily for 2 days, 06/29/15   Shaune Pollack, MD  theophylline (THEODUR) 300 MG 12 hr tablet Take 300 mg by mouth 2 (two) times daily. 03/02/15   Historical Provider, MD  tiotropium (SPIRIVA HANDIHALER) 18 MCG inhalation capsule Place 1 capsule (18 mcg total) into inhaler and inhale daily. 10/19/14   Srikar Sudini, MD  zolpidem (AMBIEN) 5 MG tablet Take 1 tablet (5 mg total) by mouth at bedtime as needed for sleep. 06/29/15   Shaune Pollack, MD    Allergies Review of patient's allergies indicates no known allergies.  Family History  Problem Relation Age of Onset  . Diabetes Mother   . Cancer Father     lung  . Heart disease Father   . Heart attack Father   . COPD Sister   . Heart disease Brother   . Heart attack Brother   . COPD Brother   . Hypertension Neg Hx   . Stroke Neg Hx     Social History Social History  Substance Use Topics  . Smoking status: Current Every Day Smoker    Packs/day: 1.00    Years: 40.00    Types: Cigarettes  . Smokeless tobacco: Never Used  . Alcohol use Yes     Comment: daughter states patient has been drinking heavily for the past three years with increased drinking over the past 6 months.    Review of Systems Constitutional: No fever/chills Eyes: No visual changes. ENT: No sore throat. Cardiovascular: Denies chest pain. Respiratory: Chronic shortness of breath due to  COPD per patient. Gastrointestinal: See history of present illness Genitourinary: Negative for dysuria. Musculoskeletal: Negative for back pain. Skin: Negative for rash. Neurological: Negative for headaches, focal weakness or numbness.  10-point ROS otherwise negative.  ____________________________________________   PHYSICAL EXAM:  VITAL SIGNS: ED Triage Vitals  Enc Vitals Group     BP 10/05/15 1132 (!) 148/93     Pulse Rate 10/05/15 1132 (!) 132     Resp 10/05/15 1132 20     Temp 10/05/15 1132 98.6 F (37 C)     Temp src --      SpO2 10/05/15 1132 96 %     Weight 10/05/15 1132 (!) 1482 lb (672.2 kg)  Height 10/05/15 1132  (1.778 m)     Head Circumference --      Peak Flow --      Pain Score 10/05/15 1133 0     Pain Loc --      Pain Edu? --      Excl. in GC? --     Constitutional: Alert and oriented. Well appearing and in no acute distress. Eyes: Conjunctivae are normal. PERRL. EOMI.John Tillis Head: Atraumatic. Nose: No congestion/rhinnorhea. Mouth/Throat: Mucous membranes are moist.  Oropharynx non-erythematous. Neck: No stridor. Cardiovascular: Normal rate, regular rhythm. Grossly normal heart sounds.  Good peripheral circulation. Respiratory: Normal respiratory effort.  No retractions. Lungs CTAB. Gastrointestinal: Soft and nontender to palpation but feels uncomfortable due to the size patient reports.  distention. No abdominal bruits. No CVA tenderness. Musculoskeletal: No lower extremity tenderness nor edema.  No joint effusions. Neurologic:  Normal speech and language. No gross focal neurologic deficits are appreciated. No gait instability. Skin:  Skin is warm, dry and intact. No rash noted. John test Psychiatric: Mood and affect are normal. Speech and behavior are normal.  ____________________________________________   LABS (all labs ordered are listed, but only abnormal results are displayed)  Labs Reviewed  LIPASE, BLOOD - Abnormal; Notable for  the following:       Result Value   Lipase 66 (*)    All other components within normal limits  COMPREHENSIVE METABOLIC PANEL - Abnormal; Notable for the following:    Sodium 130 (*)    Potassium 2.7 (*)    Chloride 89 (*)    Glucose, Bld 126 (*)    BUN <5 (*)    Creatinine, Ser <0.30 (*)    Albumin 2.7 (*)    AST 435 (*)    ALT 237 (*)    Alkaline Phosphatase 353 (*)    Total Bilirubin 23.9 (*)    All other components within normal limits  CBC - Abnormal; Notable for the following:    WBC 13.1 (*)    RBC 3.07 (*)    Hemoglobin 12.7 (*)    HCT 36.7 (*)    MCV 119.7 (*)    MCH 41.3 (*)    RDW 18.5 (*)    All other components within normal limits  URINALYSIS COMPLETEWITH MICROSCOPIC ([facility] ONLY) - Abnormal; Notable for the following:    Color, Urine BROWN (*)    APPearance CLOUDY (*)    Glucose, UA   (*)    Value: TEST NOT REPORTED DUE TO COLOR INTERFERENCE OF URINE PIGMENT   Bilirubin Urine   (*)    Value: TEST NOT REPORTED DUE TO COLOR INTERFERENCE OF URINE PIGMENT   Ketones, ur   (*)    Value: TEST NOT REPORTED DUE TO COLOR INTERFERENCE OF URINE PIGMENT   Hgb urine dipstick   (*)    Value: TEST NOT REPORTED DUE TO COLOR INTERFERENCE OF URINE PIGMENT   Protein, ur   (*)    Value: TEST NOT REPORTED DUE TO COLOR INTERFERENCE OF URINE PIGMENT   Nitrite   (*)    Value: TEST NOT REPORTED DUE TO COLOR INTERFERENCE OF URINE PIGMENT   Leukocytes, UA   (*)    Value: TEST NOT REPORTED DUE TO COLOR INTERFERENCE OF URINE PIGMENT   Squamous Epithelial / LPF 0-5 (*)    All other components within normal limits  LACTIC ACID, PLASMA - Abnormal; Notable for the following:    Lactic Acid, Venous 2.0 (*)  All other components within normal limits  TROPONIN I - Abnormal; Notable for the following:    Troponin I 0.06 (*)    All other components within normal limits  ETHANOL  LACTIC ACID, PLASMA  PROTIME-INR   ____________________________________________  EKG  EKG read and  interpreted by me shows sinus tachycardia rate of 126 appears to be a small amount of ST depression inferiorly otherwise no acute changes ____________________________________________  RADIOLOGY CLINICAL DATA:  Weakness.  Abdominal pain, distention  EXAM: ABDOMEN ULTRASOUND COMPLETE  COMPARISON:  None.  FINDINGS: Gallbladder: Mild wall thickening, 5 mm. No visible stones. Negative sonographic Murphy's.  Common bile duct: Diameter: Normal caliber, 3 mm  Liver: Increased echotexture compatible with fatty infiltration or intrinsic liver disease. Liver appears enlarged, with a craniocaudal length of 19 cm.  IVC: No abnormality visualized.  Pancreas: Not well visualized due to overlying bowel gas.  Spleen: Size and appearance within normal limits.  Right Kidney: Length: 12.2 cm. Echogenicity within normal limits. No mass or hydronephrosis visualized.  Left Kidney: Length: 12.0 cm. Echogenicity within normal limits. No mass or hydronephrosis visualized.  Abdominal aorta: Mildly ectatic at 2.9 cm within the mid aorta.  Other findings: None.  IMPRESSION: Diffusely increased echotexture throughout the liver compatible with fatty infiltration or intrinsic liver disease. Liver appears enlarged.  Mild gallbladder wall thickening without visible stones or sonographic Murphy's sign. This may be related to liver disease.   Electronically Signed   By: Charlett Nose M.D.   On: 10/05/2015 13:54  ____________________________________________   PROCEDURES  Procedure(s) performed:   Procedures  Critical Care performed:  ____________________________________________   INITIAL IMPRESSION / ASSESSMENT AND PLAN / ED COURSE  Pertinent labs & imaging results that were available during my care of the patient were reviewed by me and considered in my medical decision making (see chart for details).    Clinical Course      ____________________________________________   FINAL CLINICAL IMPRESSION(S) / ED DIAGNOSES  Final diagnoses:  Elevated troponin  Jaundice      NEW MEDICATIONS STARTED DURING THIS VISIT:  New Prescriptions   No medications on file     Note:  This document was prepared using Dragon voice recognition software and may include unintentional dictation errors.    Arnaldo Natal, MD 10/05/15 [phone] Family comes out to tell me about the drinking. Patient is getting a little shaky they said. Patient has had withdrawal before. I have are to put in the see what but I will give him a little bit of Ativan now.   Arnaldo Natal, MD 10/05/15 [phone]

## 2015-10-05 NOTE — ED Notes (Signed)
Patient transported to Ultrasound 

## 2015-10-05 NOTE — ED Notes (Addendum)
Dr Darnelle Catalan notified of K 2.7, bilirubin 23.9

## 2015-10-05 NOTE — ED Triage Notes (Signed)
Patient states "I have felt terrible for four weeks.  I have been jaundiced for a week and I have an extremely distended belly".  Heart rate has been elevated.

## 2015-10-05 NOTE — H&P (Signed)
[facility] [place] at [facility]   PATIENT NAME: Devin Bryant    MR#:  [id number]  DATE OF BIRTH:  [birth year]  DATE OF ADMISSION:  10/05/2015  PRIMARY CARE PHYSICIAN: Baruch Gouty, MD. Patient sees Dr. Hilda Blades at [place] [facility].  REQUESTING/REFERRING PHYSICIAN: Dr Dorothea Glassman  CHIEF COMPLAINT:   Chief Complaint  Patient presents with  . abdominal distention  . Anorexia    HISTORY OF PRESENT ILLNESS:  Devin Bryant  is a 60 y.o. male. He states for the last 4 weeks he has not been feeling good. He has no energy, no appetite, not sleeping well. He is also had distention in his abdomen but he lost 8 pounds. His legs have been swollen. He's had no bowel movement in 3 days but when he does have a bowel movement it's white. He's been lethargic. Over the last week his family has noticed him being jaundiced. He drinks 5 alcohol drinks a day and sometimes can go through withdrawal if he doesn't drink. In the ER he was found to be jaundiced with a total bilirubin of 23.9. Hospitalist services were contacted for further evaluation.  PAST MEDICAL HISTORY:   Past Medical History:  Diagnosis Date  . Adjustment disorder with depressed mood   . Allergy   . Anxiety   . COPD (chronic obstructive pulmonary disease) (HCC)   . ED (erectile dysfunction)   . Elevated glucose   . Elevated serum glutamic pyruvic transaminase (SGPT) level   . Emphysema lung (HCC)   . GERD (gastroesophageal reflux disease)   . Hypertension   . Hypertriglyceridemia   . Tobacco use     PAST SURGICAL HISTORY:   Past Surgical History:  Procedure Laterality Date  . KNEE SURGERY     arthroscopic  . SEPTOPLASTY  Feb 2016  . TONSILLECTOMY      SOCIAL HISTORY:   Social History  Substance Use Topics  . Smoking status: Current Every Day Smoker    Packs/day: 0.50    Years: 40.00    Types: Cigarettes  . Smokeless tobacco: Never Used  . Alcohol use Yes     Comment: daughter  states patient has been drinking heavily for the past three years with increased drinking over the past 6 months.    FAMILY HISTORY:   Family History  Problem Relation Age of Onset  . Diabetes Mother   . CAD Mother   . Cancer Father     lung  . Heart disease Father   . Heart attack Father   . COPD Sister   . Heart disease Brother   . Heart attack Brother   . COPD Brother   . Hypertension Neg Hx   . Stroke Neg Hx     DRUG ALLERGIES:  No Known Allergies  REVIEW OF SYSTEMS:  CONSTITUTIONAL: No fever. Positive for chills and sweats. Positive for weight loss. Positive for fatigue and weakness.  EYES: No blurred or double vision. Wears glasses. EARS, NOSE, AND THROAT: No tinnitus or ear pain. No sore throat. Positive for runny nose RESPIRATORY: Occasional cough, positive for shortness of breath, no wheezing or hemoptysis.  CARDIOVASCULAR: No chest pain, orthopnea, edema.  GASTROINTESTINAL: Positive for nausea and vomiting last week, no diarrhea or abdominal pain. No blood in bowel movements. Positive for abdominal distention and constipation. GENITOURINARY: No dysuria, hematuria.  ENDOCRINE: No polyuria, nocturia,  HEMATOLOGY: No anemia, easy bruising or bleeding SKIN: No rash or lesion. MUSCULOSKELETAL: No joint pain or arthritis.  NEUROLOGIC: No tingling, numbness, weakness. Positive for dizziness. PSYCHIATRY: Positive for anxiety.   MEDICATIONS AT HOME:   Prior to Admission medications   Medication Sig Start Date End Date Taking? Authorizing Provider  ADVAIR HFA 115-21 MCG/ACT inhaler Inhale 2 puffs into the lungs 2 (two) times daily.  10/11/14   Historical Provider, MD  albuterol (PROVENTIL HFA;VENTOLIN HFA) 108 (90 BASE) MCG/ACT inhaler Inhale 2 puffs into the lungs every 6 (six) hours as needed for wheezing or shortness of breath.     Historical Provider, MD  albuterol (PROVENTIL) (2.5 MG/3ML) 0.083% nebulizer solution Take 3 mLs (2.5 mg total) by nebulization every 4  (four) hours as needed for wheezing or shortness of breath. 10/15/14   Kerman Passey, MD  aspirin EC 81 MG tablet Take 81 mg by mouth daily. Reported on 06/27/2015    Historical Provider, MD  Cetirizine HCl (ZYRTEC ALLERGY) 10 MG CAPS Take 10 mg by mouth daily.    Historical Provider, MD  Clobetasol Propionate 0.05 % shampoo Apply 1 application topically as needed.  03/31/15   Historical Provider, MD  clonazePAM (KLONOPIN) 0.5 MG tablet Take 1 tablet (0.5 mg total) by mouth 2 (two) times daily as needed for anxiety. 04/11/15   Kerman Passey, MD  CORDRAN 0.05 % lotion Apply 1 application topically 2 (two) times daily. Reported on 04/04/2015 03/09/15   Historical Provider, MD  Desoximetasone (TOPICORT SPRAY EX) Apply 1 application topically 2 (two) times daily as needed (scorisis/irritation over body. Use all over except for on face.). Reported on 04/04/2015    Historical Provider, MD  esomeprazole (NEXIUM) 20 MG capsule Take 20 mg by mouth daily at 12 noon.    Historical Provider, MD  fluticasone (FLONASE) 50 MCG/ACT nasal spray Place 1 spray into both nostrils daily.  07/23/14   Historical Provider, MD  gabapentin (NEURONTIN) 300 MG capsule Take 300 mg by mouth 2 (two) times daily. 03/22/15   Historical Provider, MD  guaiFENesin-codeine 100-10 MG/5ML syrup Take 5 mLs by mouth every 4 (four) hours as needed for cough. 06/29/15   Shaune Pollack, MD  ketoconazole (NIZORAL) 2 % cream 1 APPLICATION APPLY ON THE SKIN DAILY 03/30/15   Historical Provider, MD  mometasone (ELOCON) 0.1 % ointment Apply 1 application topically daily as needed (for ear irritation/eczema in ear). Apply to each ear 1 drop daily as needed    Historical Provider, MD  Multiple Vitamin (MULTIVITAMIN) tablet Take 1 tablet by mouth daily.    Historical Provider, MD  nystatin (MYCOSTATIN) 100000 UNIT/ML suspension Take 5 mLs by mouth 4 (four) times daily as needed. thrush 02/08/15   Historical Provider, MD  PARoxetine (PAXIL) 20 MG tablet Take 20 mg by  mouth daily.    Historical Provider, MD  predniSONE (DELTASONE) 10 MG tablet 40 mg po daily for 2 days, 20 mg po daily for 2 days, 10 mg po daily for 2 days, 06/29/15   Shaune Pollack, MD  theophylline (THEODUR) 300 MG 12 hr tablet Take 300 mg by mouth 2 (two) times daily. 03/02/15   Historical Provider, MD  tiotropium (SPIRIVA HANDIHALER) 18 MCG inhalation capsule Place 1 capsule (18 mcg total) into inhaler and inhale daily. 10/19/14   Srikar Sudini, MD  zolpidem (AMBIEN) 5 MG tablet Take 1 tablet (5 mg total) by mouth at bedtime as needed for sleep. 06/29/15   Shaune Pollack, MD      VITAL SIGNS:  Blood pressure 129/77, pulse (!) 119, temperature 98.6 F (37 C), resp. rate  16, height 5\' 10"  (1.778 m), weight (!) 672.2 kg (1482 lb), SpO2 94 %. The patient's weight was entered wrong in the computer system. PHYSICAL EXAMINATION:  GENERAL:  60 y.o.-year-old patient lying in the bed with no acute distress.  EYES: Pupils equal, round, reactive to light and accommodation. Positive for scleral icterus. Extraocular muscles intact.  HEENT: Head atraumatic, normocephalic. Oropharynx and nasopharynx clear. Slight thrush seen NECK:  Supple, no jugular venous distention. No thyroid enlargement, no tenderness.  LUNGS: Decreased breath sounds bilaterally, no wheezing, rales,rhonchi or crepitation. No use of accessory muscles of respiration.  CARDIOVASCULAR: S1, S2 normal. No murmurs, rubs, or gallops.  ABDOMEN: Soft, nontender, distended. Bowel sounds present. Positive for hepatomegaly. EXTREMITIES: 2+ pedal edema. No cyanosis, or clubbing.  NEUROLOGIC: Cranial nerves II through XII are intact. Muscle strength 5/5 in all extremities. Sensation intact. Gait not checked.  PSYCHIATRIC: The patient is alert and oriented x 3.  SKIN: Jaundiced throughout. Patient also has psoriasis face and arms.  LABORATORY PANEL:   CBC  Recent Labs Lab 10/05/15 1156  WBC 13.1*  HGB 12.7*  HCT 36.7*  PLT 165    ------------------------------------------------------------------------------------------------------------------  Chemistries   Recent Labs Lab 10/05/15 1156  NA 130*  K 2.7*  CL 89*  CO2 26  GLUCOSE 126*  BUN <5*  CREATININE <0.30*  CALCIUM 9.0  MG 1.3*  AST 435*  ALT 237*  ALKPHOS 353*  BILITOT 23.9*   ------------------------------------------------------------------------------------------------------------------  Cardiac Enzymes  Recent Labs Lab 10/05/15 1156  TROPONINI 0.06*   ------------------------------------------------------------------------------------------------------------------  RADIOLOGY:  US Abdomen Complete  Result Date: 10/05/2015 CLINICAL DATA:  Weakness.  Abdominal pain, distention EXAM: ABDOMEN ULTRASOUND COMPLETE COMPARISON:  None. FINDINGS: Gallbladder: Mild wall thickening, 5 mm. No visible stones. Negative sonographic Murphy's. Common bile duct: Diameter: Normal caliber, 3 mm Liver: Increased echotexture compatible with fatty infiltration or intrinsic liver disease. Liver appears enlarged, with a craniocaudal length of 19 cm. IVC: No abnormality visualized. Pancreas: Not well visualized due to overlying bowel gas. Spleen: Size and appearance within normal limits. Right Kidney: Length: 12.2 cm. Echogenicity within normal limits. No mass or hydronephrosis visualized. Left Kidney: Length: 12.0 cm. Echogenicity within normal limits. No mass or hydronephrosis visualized. Abdominal aorta: Mildly ectatic at 2.9 cm within the mid aorta. Other findings: None. IMPRESSION: Diffusely increased echotexture throughout the liver compatible with fatty infiltration or intrinsic liver disease. Liver appears enlarged. Mild gallbladder wall thickening without visible stones or sonographic Murphy's sign. This may be related to liver disease. Electronically Signed   By: Charlett Nose M.D.   On: 10/05/2015 13:54   Dg Chest Portable 1 View  Result Date:  10/05/2015 CLINICAL DATA:  Jaundiced, distended abdomen. Symptoms for 4 weeks. EXAM: PORTABLE CHEST 1 VIEW COMPARISON:  None. FINDINGS: The heart size and mediastinal contours are within normal limits. Both lungs are clear. The visualized skeletal structures are unremarkable. IMPRESSION: No active disease. Electronically Signed   By: Elsie Stain M.D.   On: 10/05/2015 13:16    EKG:   Sinus tachycardia 126 bpm, left atrial enlargement, PVCs.  IMPRESSION AND PLAN:   1. Painless jaundice. Ultrasound of the abdomen showing no visible stones in the ducts. Likely fatty infiltration of liver or intrinsic liver disease. I will get an MRI of the abdomen including MRCP to further evaluate the liver, pancreas and ducts. This also can be due to alcoholic liver disease. I advised the patient he cannot drink any longer. 2. Alcoholic hepatitis. Continued alcohol abuse. Check acute hepatitis  profiles. If MRI of the abdomen comes back negative for stones or mass I will start prednisolone 40 mg daily for alcoholic liver disease. Patient advised that he can no longer drink. In on PT INR and PTT.  Ciwa protocol ordered. 3. Hypokalemia replace potassium IV and orally. Check a magnesium and replace that also. 4. Hyponatremia. Start normal saline. Likely dehydration related. 5. Elevated liver function test. Likely related to alcohol continue to trend. Check hepatitis profiles. 6. COPD. Continue inhalers and nebulizer treatments. 7. Psoriasis the patient states that he takes [place] at home. 8. Anxiety depression continue Paxil and when necessary Klonopin. 9. Thrush nystatin swish and swallow 10. Tobacco abuse smoking cessation counseling done 3 minutes by me and nicotine patch applied 11. Lactic acidosis. Likely because of all the things that are going on. Try to hold off on antibiotics if possible. 12. Elevated troponin likely demand ischemia from everything that's going on. Check a few more troponins. Try to hold off  on aspirin just in case procedure needed. Patient not complaining of any chest pain.  All the records are reviewed and case discussed with ED provider. Management plans discussed with the patient, family and they are in agreement.  CODE STATUS: Full code  TOTAL TIME TAKING CARE OF THIS PATIENT: 55 minutes.    Alford Highland M.D on 10/05/2015 at 3:20 PM  Between 7am to 6pm - Pager - [phone]  After 6pm call admission pager [phone]  [facility] Office  [phone]  CC: Primary care physician; Baruch Gouty, MD

## 2015-10-06 ENCOUNTER — Inpatient Hospital Stay: Payer: BLUE CROSS/BLUE SHIELD

## 2015-10-06 LAB — CBC
HCT: 30.8 % — ABNORMAL LOW (ref 40.0–52.0)
Hemoglobin: 11 g/dL — ABNORMAL LOW (ref 13.0–18.0)
MCH: 42.9 pg — ABNORMAL HIGH (ref 26.0–34.0)
MCHC: 35.7 g/dL (ref 32.0–36.0)
MCV: 120.2 fL — AB (ref 80.0–100.0)
PLATELETS: 145 10*3/uL — AB (ref 150–440)
RBC: 2.56 MIL/uL — AB (ref 4.40–5.90)
RDW: 18.5 % — AB (ref 11.5–14.5)
WBC: 10.8 10*3/uL — AB (ref 3.8–10.6)

## 2015-10-06 LAB — COMPREHENSIVE METABOLIC PANEL
ALBUMIN: 2.2 g/dL — AB (ref 3.5–5.0)
ALT: 178 U/L — ABNORMAL HIGH (ref 17–63)
AST: 321 U/L — AB (ref 15–41)
Alkaline Phosphatase: 292 U/L — ABNORMAL HIGH (ref 38–126)
Anion gap: 7 (ref 5–15)
BUN: 6 mg/dL (ref 6–20)
CHLORIDE: 98 mmol/L — AB (ref 101–111)
CO2: 30 mmol/L (ref 22–32)
Calcium: 8.2 mg/dL — ABNORMAL LOW (ref 8.9–10.3)
Creatinine, Ser: <0.3 mg/dL — ABNORMAL LOW (ref 0.61–1.24)
Glucose, Bld: 93 mg/dL (ref 65–99)
POTASSIUM: 3.5 mmol/L (ref 3.5–5.1)
SODIUM: 135 mmol/L (ref 135–145)
Total Bilirubin: 21.8 mg/dL (ref 0.3–1.2)
Total Protein: 6 g/dL — ABNORMAL LOW (ref 6.5–8.1)

## 2015-10-06 LAB — MAGNESIUM: MAGNESIUM: 1.7 mg/dL (ref 1.7–2.4)

## 2015-10-06 LAB — HEPATITIS PANEL, ACUTE
HEP A IGM: NEGATIVE
HEP B C IGM: NEGATIVE
HEP B S AG: NEGATIVE

## 2015-10-06 LAB — PHOSPHORUS: PHOSPHORUS: UNDETERMINED mg/dL (ref 2.5–4.6)

## 2015-10-06 LAB — POTASSIUM: POTASSIUM: 3.2 mmol/L — AB (ref 3.5–5.1)

## 2015-10-06 MED ORDER — GADOBENATE DIMEGLUMINE 529 MG/ML IV SOLN
20.0000 mL | Freq: Once | INTRAVENOUS | Status: AC | PRN
  Administered 2015-10-06: 11:00:00 17 mL via INTRAVENOUS

## 2015-10-06 MED ORDER — POTASSIUM CHLORIDE 10 MEQ/100ML IV SOLN
10.0000 meq | INTRAVENOUS | Status: AC
  Administered 2015-10-06 – 2015-10-07 (×4): 10 meq via INTRAVENOUS
  Filled 2015-10-06 (×4): qty 100

## 2015-10-06 MED ORDER — SODIUM CHLORIDE 0.9 % IV SOLN
INTRAVENOUS | Status: DC
  Administered 2015-10-06 – 2015-10-07 (×2): via INTRAVENOUS

## 2015-10-06 NOTE — Progress Notes (Signed)
Pharmacy Consult for electrolyte supplementation Indication: hypokalemia  No Known Allergies  Patient Measurements: Height: 5\' 10"  (177.8 cm) Weight: 181 lb 8 oz (82.3 kg) IBW/kg (Calculated) : 73  Vital Signs: Temp: 98.1 F (36.7 C) (08/03 1356) Temp Source: Oral (08/03 1356) BP: 135/85 (08/03 1356) Pulse Rate: 103 (08/03 1356) Intake/Output from previous day: 08/02 0701 - 08/03 0700 In: 613.3 [I.V.:613.3] Out: -  Intake/Output from this shift: No intake/output data recorded.  Labs:  Recent Labs  10/05/15 1137 10/05/15 1156 10/06/15 0453  WBC  --  13.1* 10.8*  HGB  --  12.7* 11.0*  HCT  --  36.7* 30.8*  PLT  --  165 145*  APTT 33  --   --   INR 0.98  --   --      Recent Labs  10/05/15 1156 10/06/15 0453  NA 130* 135  K 2.7* 3.5  CL 89* 98*  CO2 26 30  GLUCOSE 126* 93  BUN <5* 6  CREATININE <0.30* <0.30*  CALCIUM 9.0 8.2*  MG 1.3*  --   PROT 7.3 6.0*  ALBUMIN 2.7* 2.2*  AST 435* 321*  ALT 237* 178*  ALKPHOS 353* 292*  BILITOT 23.9* 21.8*   CrCl cannot be calculated (This lab value cannot be used to calculate CrCl because it is not a number: <0.30).   No results for input(s): GLUCAP in the last 72 hours.  Assessment: Pharmacy consulted to monitor/replace electrolytes in this 60 year old male with hypokalemia admitted for alcoholic hepatitis  Plan:  Potassium and magnesium replaced overnight. Morning sample hemolyzed. Will recheck electrolytes this evening and replace as needed  Dayzee Trower C 10/06/2015,2:21 PM

## 2015-10-06 NOTE — Progress Notes (Signed)
[facility] - [place] at [facility]   PATIENT NAME: Devin Bryant    MR#:  [id number]  DATE OF BIRTH:  02-12-1956  SUBJECTIVE:  CHIEF COMPLAINT:   Chief Complaint  Patient presents with  . abdominal distention  . Anorexia  Feels better than yesterday.  Still feeling weak REVIEW OF SYSTEMS:  Review of Systems  Constitutional: Positive for malaise/fatigue. Negative for chills, fever and weight loss.  HENT: Negative for nosebleeds and sore throat.   Eyes: Negative for blurred vision.  Respiratory: Negative for cough, shortness of breath and wheezing.   Cardiovascular: Negative for chest pain, orthopnea, leg swelling and PND.  Gastrointestinal: Negative for abdominal pain, constipation, diarrhea, heartburn, nausea and vomiting.  Genitourinary: Negative for dysuria and urgency.  Musculoskeletal: Negative for back pain.  Skin: Negative for rash.  Neurological: Positive for weakness. Negative for dizziness, speech change, focal weakness and headaches.  Endo/Heme/Allergies: Does not bruise/bleed easily.  Psychiatric/Behavioral: Negative for depression.    DRUG ALLERGIES:  No Known Allergies VITALS:  Blood pressure 140/80, pulse (!) 105, temperature 99.3 F (37.4 C), temperature source Oral, resp. rate 18, height  (1.778 m), weight 82.3 kg (181 lb 8 oz), SpO2 97 %. PHYSICAL EXAMINATION:  Physical Exam  Constitutional: He is oriented to person, place, and time and well-developed, well-nourished, and in no distress.  HENT:  Head: Normocephalic and atraumatic.  Eyes: Conjunctivae and EOM are normal. Pupils are equal, round, and reactive to light.  Neck: Normal range of motion. Neck supple. No tracheal deviation present. No thyromegaly present.  Cardiovascular: Normal rate, regular rhythm and normal heart sounds.   Pulmonary/Chest: Effort normal and breath sounds normal. No respiratory distress. He has no wheezes. He exhibits no tenderness.  Abdominal: Soft.  Bowel sounds are normal. He exhibits no distension. There is no tenderness.  Musculoskeletal: Normal range of motion.  Neurological: He is alert and oriented to person, place, and time. No cranial nerve deficit.  Skin: Skin is warm, dry and intact. No rash noted.  Jaundiced throughout. Patient also has psoriasis face and arms  Psychiatric: Mood and affect normal.  Nursing note and vitals reviewed.  LABORATORY PANEL:   CBC  Recent Labs Lab 10/06/15 0453  WBC 10.8*  HGB 11.0*  HCT 30.8*  PLT 145*   ------------------------------------------------------------------------------------------------------------------ Chemistries   Recent Labs Lab 10/05/15 1156 10/06/15 0453  NA 130* 135  K 2.7* 3.5  CL 89* 98*  CO2 26 30  GLUCOSE 126* 93  BUN <5* 6  CREATININE <0.30* <0.30*  CALCIUM 9.0 8.2*  MG 1.3*  --   AST 435* 321*  ALT 237* 178*  ALKPHOS 353* 292*  BILITOT 23.9* 21.8*   RADIOLOGY:  US Abdomen Complete  Result Date: 10/05/2015 CLINICAL DATA:  Weakness.  Abdominal pain, distention EXAM: ABDOMEN ULTRASOUND COMPLETE COMPARISON:  None. FINDINGS: Gallbladder: Mild wall thickening, 5 mm. No visible stones. Negative sonographic Murphy's. Common bile duct: Diameter: Normal caliber, 3 mm Liver: Increased echotexture compatible with fatty infiltration or intrinsic liver disease. Liver appears enlarged, with a craniocaudal length of 19 cm. IVC: No abnormality visualized. Pancreas: Not well visualized due to overlying bowel gas. Spleen: Size and appearance within normal limits. Right Kidney: Length: 12.2 cm. Echogenicity within normal limits. No mass or hydronephrosis visualized. Left Kidney: Length: 12.0 cm. Echogenicity within normal limits. No mass or hydronephrosis visualized. Abdominal aorta: Mildly ectatic at 2.9 cm within the mid aorta. Other findings: None. IMPRESSION: Diffusely increased echotexture throughout the liver compatible with  fatty infiltration or intrinsic liver  disease. Liver appears enlarged. Mild gallbladder wall thickening without visible stones or sonographic Murphy's sign. This may be related to liver disease. Electronically Signed   By: Charlett Nose M.D.   On: 10/05/2015 13:54   Dg Chest Portable 1 View  Result Date: 10/05/2015 CLINICAL DATA:  Jaundiced, distended abdomen. Symptoms for 4 weeks. EXAM: PORTABLE CHEST 1 VIEW COMPARISON:  None. FINDINGS: The heart size and mediastinal contours are within normal limits. Both lungs are clear. The visualized skeletal structures are unremarkable. IMPRESSION: No active disease. Electronically Signed   By: Elsie Stain M.D.   On: 10/05/2015 13:16   Mr Roe Coombs W/wo Cm/mrcp  Result Date: 10/06/2015 CLINICAL DATA:  60 year old male with history of generalize mall lays for the past 4 weeks, during which time he has lost 8 pounds. Leg swelling. White bowel movements. Lethargy. Jaundice. Alcohol abuse. Elevated total bilirubin. EXAM: MRI ABDOMEN WITHOUT AND WITH CONTRAST (INCLUDING MRCP) TECHNIQUE: Multiplanar multisequence MR imaging of the abdomen was performed both before and after the administration of intravenous contrast. Heavily T2-weighted images of the biliary and pancreatic ducts were obtained, and three-dimensional MRCP images were rendered by post processing. CONTRAST:  63mL MULTIHANCE GADOBENATE DIMEGLUMINE 529 MG/ML IV SOLN COMPARISON:  No priors. FINDINGS: Comment: Study is limited by considerable patient respiratory motion. Lower chest: Signal intensity in the right lower lobe favored to reflect subsegmental atelectasis. Trace right pleural effusion lying dependently. Hepatobiliary: Diffuse loss of signal intensity throughout the hepatic parenchyma, compatible with hepatic steatosis. Liver is enlarged measuring 27.7 cm craniocaudally. No suspicious cystic or solid hepatic lesions. No intra or extrahepatic biliary ductal dilatation noted on MRCP images. Common bile duct measures 5 mm in the porta hepatis. No  filling defects in the common bile duct to suggest choledocholithiasis. Gallbladder is normal in appearance. Pancreas: No pancreatic mass. No pancreatic ductal dilatation. No pancreatic or peripancreatic fluid or inflammatory changes. Spleen: Unremarkable.  Specifically, normal in size. Adrenals/Urinary Tract: Bilateral kidneys and bilateral adrenal glands are normal in appearance. No hydroureteronephrosis in the visualized abdomen. Stomach/Bowel: Visualized portions are unremarkable. Vascular/Lymphatic: No aneurysm noted in the visualized portions of the abdomen. Portal vein is patent and borderline dilated measuring 14 mm in diameter. Other: Trace volume of ascites. Musculoskeletal: No aggressive osseous lesions are noted in the visualized portions of the skeleton. IMPRESSION: 1. Hepatomegaly with hepatic steatosis. 2. No suspicious hepatic lesions. 3. No evidence of biliary tract obstruction. 4. Trace volume of ascites. 5. Trace right pleural effusion with probable subsegmental atelectasis in the right lower lobe. Electronically Signed   By: Trudie Reed M.D.   On: 10/06/2015 10:53   ASSESSMENT AND PLAN:  60 year old male with a known history of anxiety, COPD, GERD is admitted for  1. Painless jaundice. Ultrasound of the abdomen showing no visible stones in the ducts. Likely fatty infiltration of liver or intrinsic liver disease. - MRI of the abdomen shows Hepatomegaly with hepatic steatosis.  GI consult  2. Alcoholic hepatitis. Continued alcohol abuse.  Pending hepatitis profiles. - will start prednisolone 40 mg daily for alcoholic liver disease. Patient advised that he can no longer drink. Ciwa protocol ordered.  3. Hypokalemia - Replete and recheck  4. Hyponatremia: - Resolved with IV hydration, Likely dehydration related.  5. Elevated liver function test. Likely related to alcohol continue to trend.  Pending hepatitis profiles.  6. COPD. Continue inhalers and nebulizer treatments.  7.  Psoriasis: Continue [place] at home.  8. Anxiety/depression: continue Paxil and when  necessary Klonopin.  9. Thrush: nystatin swish and swallow  10. Tobacco abuse smoking cessation counseling done 3 minutes by Dr Renae Gloss and nicotine patch applied  11. Lactic acidosis: resolved  12. Elevated troponin likely demand ischemia     All the records are reviewed and case discussed with Care Management/Social Worker. Management plans discussed with the patient, family and they are in agreement.  CODE STATUS: Full code  TOTAL TIME TAKING CARE OF THIS PATIENT: 35 minutes.   More than 50% of the time was spent in counseling/coordination of care: YES  POSSIBLE D/C IN 1-2 DAYS, DEPENDING ON CLINICAL CONDITION.   [facility], Daaiel Starlin M.D on 10/06/2015 at 11:57 AM  Between 7am to 6pm - Pager - [phone]  After 6pm go to [email or web address] - Social research officer, government  [facility] [place] [facility]  Office  [phone]  CC: Primary care physician; Baruch Gouty, MD  Note: This dictation was prepared with Dragon dictation along with smaller phrase technology. Any transcriptional errors that result from this process are unintentional.

## 2015-10-06 NOTE — Progress Notes (Addendum)
Pharmacy Consult for electrolyte supplementation Indication: hypokalemia  No Known Allergies  Patient Measurements: Height: 5\' 10"  (177.8 cm) Weight: 181 lb 8 oz (82.3 kg) IBW/kg (Calculated) : 73  Vital Signs: Temp: 98.1 F (36.7 C) (08/03 1356) Temp Source: Oral (08/03 1356) BP: 135/85 (08/03 1356) Pulse Rate: 103 (08/03 1356) Intake/Output from previous day: 08/02 0701 - 08/03 0700 In: 613.3 [I.V.:613.3] Out: -  Intake/Output from this shift: No intake/output data recorded.  Labs:  Recent Labs  10/05/15 1137 10/05/15 1156 10/06/15 0453  WBC  --  13.1* 10.8*  HGB  --  12.7* 11.0*  HCT  --  36.7* 30.8*  PLT  --  165 145*  APTT 33  --   --   INR 0.98  --   --      Recent Labs  10/05/15 1156 10/06/15 0453 10/06/15 1824  NA 130* 135  --   K 2.7* 3.5 3.2*  CL 89* 98*  --   CO2 26 30  --   GLUCOSE 126* 93  --   BUN <5* 6  --   CREATININE <0.30* <0.30*  --   CALCIUM 9.0 8.2*  --   MG 1.3*  --  1.7  PHOS  --   --   UNABLE TO REPORT DUE TO ICTERUS  PROT 7.3 6.0*  --   ALBUMIN 2.7* 2.2*  --   AST 435* 321*  --   ALT 237* 178*  --   ALKPHOS 353* 292*  --   BILITOT 23.9* 21.8*  --    CrCl cannot be calculated (This lab value cannot be used to calculate CrCl because it is not a number: <0.30).   No results for input(s): GLUCAP in the last 72 hours.  Assessment: Pharmacy consulted to monitor/replace electrolytes in this 60 year old male with hypokalemia admitted for alcoholic hepatitis  Plan:  Potassium and magnesium replaced overnight. Morning sample hemolyzed. Will recheck electrolytes this evening and replace as needed  8/3 @ 18:00 :  K = 3.2, Mag = 1.7, Phos = "unable to be reported" due to icterus.   Will order KCl 10 mEq IV X 4 and recheck K on 8/4 with AM labs.   Robbins,Jason D 10/06/2015,7:51 PM  0804 0448 K 3, Mg and phos not reported. Ordered potassium chloride 40 mEq IV x 1, will recheck electrolytes after infusion. Marilouise Densmore A. [place],  [place].D., BCPS

## 2015-10-07 LAB — CBC
HEMATOCRIT: 31.4 % — AB (ref 40.0–52.0)
Hemoglobin: 11 g/dL — ABNORMAL LOW (ref 13.0–18.0)
MCH: 43.2 pg — ABNORMAL HIGH (ref 26.0–34.0)
MCHC: 35.1 g/dL (ref 32.0–36.0)
MCV: 123 fL — ABNORMAL HIGH (ref 80.0–100.0)
Platelets: 148 10*3/uL — ABNORMAL LOW (ref 150–440)
RBC: 2.55 MIL/uL — AB (ref 4.40–5.90)
RDW: 18.6 % — AB (ref 11.5–14.5)
WBC: 12.8 10*3/uL — AB (ref 3.8–10.6)

## 2015-10-07 LAB — COMPREHENSIVE METABOLIC PANEL
ALBUMIN: 2.1 g/dL — AB (ref 3.5–5.0)
ALT: 141 U/L — AB (ref 17–63)
AST: 242 U/L — AB (ref 15–41)
Alkaline Phosphatase: 265 U/L — ABNORMAL HIGH (ref 38–126)
Anion gap: 7 (ref 5–15)
BILIRUBIN TOTAL: 20.6 mg/dL — AB (ref 0.3–1.2)
CHLORIDE: 101 mmol/L (ref 101–111)
CO2: 24 mmol/L (ref 22–32)
Calcium: 7.7 mg/dL — ABNORMAL LOW (ref 8.9–10.3)
Creatinine, Ser: <0.3 mg/dL — ABNORMAL LOW (ref 0.61–1.24)
GLUCOSE: 80 mg/dL (ref 65–99)
POTASSIUM: 3 mmol/L — AB (ref 3.5–5.1)
Sodium: 132 mmol/L — ABNORMAL LOW (ref 135–145)
TOTAL PROTEIN: 5.8 g/dL — AB (ref 6.5–8.1)

## 2015-10-07 LAB — BASIC METABOLIC PANEL
Anion gap: 7 (ref 5–15)
CHLORIDE: 102 mmol/L (ref 101–111)
CO2: 23 mmol/L (ref 22–32)
Calcium: 7.8 mg/dL — ABNORMAL LOW (ref 8.9–10.3)
Creatinine, Ser: <0.3 mg/dL — ABNORMAL LOW (ref 0.61–1.24)
GLUCOSE: 78 mg/dL (ref 65–99)
POTASSIUM: 3.1 mmol/L — AB (ref 3.5–5.1)
Sodium: 132 mmol/L — ABNORMAL LOW (ref 135–145)

## 2015-10-07 MED ORDER — PREDNISONE 20 MG PO TABS
30.0000 mg | ORAL_TABLET | Freq: Every day | ORAL | Status: DC
  Administered 2015-10-08 – 2015-10-10 (×3): 30 mg via ORAL
  Filled 2015-10-07 (×3): qty 1

## 2015-10-07 MED ORDER — ENOXAPARIN SODIUM 40 MG/0.4ML ~~LOC~~ SOLN
40.0000 mg | SUBCUTANEOUS | Status: DC
  Administered 2015-10-07 – 2015-10-09 (×3): 40 mg via SUBCUTANEOUS
  Filled 2015-10-07 (×3): qty 0.4

## 2015-10-07 MED ORDER — SODIUM CHLORIDE 0.45 % IV SOLN
Freq: Once | INTRAVENOUS | Status: AC
  Administered 2015-10-07: 12:00:00 via INTRAVENOUS
  Filled 2015-10-07 (×2): qty 500

## 2015-10-07 MED ORDER — SPIRONOLACTONE 25 MG PO TABS
25.0000 mg | ORAL_TABLET | Freq: Every day | ORAL | Status: DC
  Administered 2015-10-07 – 2015-10-10 (×4): 25 mg via ORAL
  Filled 2015-10-07 (×4): qty 1

## 2015-10-07 MED ORDER — POTASSIUM CHLORIDE CRYS ER 20 MEQ PO TBCR
40.0000 meq | EXTENDED_RELEASE_TABLET | Freq: Once | ORAL | Status: AC
  Administered 2015-10-07: 40 meq via ORAL
  Filled 2015-10-07: qty 2

## 2015-10-07 MED ORDER — GABAPENTIN 300 MG PO CAPS
300.0000 mg | ORAL_CAPSULE | Freq: Two times a day (BID) | ORAL | Status: DC
  Administered 2015-10-07 – 2015-10-10 (×7): 300 mg via ORAL
  Filled 2015-10-07 (×7): qty 1

## 2015-10-07 NOTE — Plan of Care (Signed)
Problem: Health Behavior/Discharge Planning: Goal: Ability to manage health-related needs will improve Outcome: Not Progressing Pt with elevated CIWA at beginning of shift but then slept for most of the night.  CIWA during day to determine ability to manage health-related needs.

## 2015-10-07 NOTE — Care Management (Signed)
Admitted to this facility with the diagnosis of jaundice. Lives with wife, Dois Davenport [phone]), Chronic home oxygen through [employer] x 1 year. Dr. Sherie Don is primary care physician at [facility] in [place]. Counseling for alcohol abuse through primary care physician in the past. Prescriptions are filled at [facility] in [place] CIWA  Precautions continue Gwenette Greet RN MSN CCM Care Management [phone]

## 2015-10-07 NOTE — Progress Notes (Signed)
Initial Nutrition Assessment  DOCUMENTATION CODES:   Not applicable  INTERVENTION:  -Monitor intake and cater to pt preferences -Encouraged lean sources of protein and low sodium food choices.  Discussed options with pt. Will need follow-up information.     NUTRITION DIAGNOSIS:   Inadequate oral intake related to poor appetite as evidenced by per patient/family report.    GOAL:   Patient will meet greater than or equal to 90% of their needs    MONITOR:   PO intake, Labs  REASON FOR ASSESSMENT:   Malnutrition Screening Tool    ASSESSMENT:      Pt admitted with abdominal pain, distention, jaundice, etoh use. GI consulted. Pt also with alcoholic hepatitis, hypokalemia, hyponatremia, elevated liver function  Past Medical History:  Diagnosis Date  . Adjustment disorder with depressed mood   . Allergy   . Anxiety   . COPD (chronic obstructive pulmonary disease) (HCC)   . ED (erectile dysfunction)   . Elevated glucose   . Elevated serum glutamic pyruvic transaminase (SGPT) level   . Emphysema lung (HCC)   . GERD (gastroesophageal reflux disease)   . Hypertension   . Hypertriglyceridemia   . Tobacco use    Pt reports no appetite for about 4 weeks prior to admission, eating bites.  No taste for food, reports abdominal distention.    Medications reviewed:folic acid, MVI, nystatin, b1, NS at 19ml/hr Labs reviewed: Na 132, K 3.1, glucose 148  Nutrition-Focused physical exam completed. Findings are no fat depletion, moderate in thigh area other areas normal muscle depletion, and no edema.     Diet Order:  Diet regular Room service appropriate? Yes; Fluid consistency: Thin  Skin:  Reviewed, no issues  Last BM:  8/2  Height:   Ht Readings from Last 1 Encounters:  10/05/15 5\' 10"  (1.778 m)    Weight: Pt reports wt loss of 6 pounds during the last 4 weeks but noted wt gain per wt encounters. (3% wt loss in the last 4 weeks)  Wt Readings from Last 1  Encounters:  10/05/15 181 lb 8 oz (82.3 kg)    Ideal Body Weight:     BMI:  Body mass index is 26.04 kg/m.  Estimated Nutritional Needs:   Kcal:  0600-4599 kcal/d  Protein:  98-123 g/d  Fluid:  >/= 2 L/d  EDUCATION NEEDS:   No education needs identified at this time  Veto Macqueen B. Freida Busman, RD, LDN [phone] (pager) Weekend/On-Call pager [phone])

## 2015-10-07 NOTE — Consult Note (Signed)
Patient with alcoholism with wine and vodka.  He has very elevated LFT's and jaundice.  I talked with him about the damage he is doing to his liver and cutting his life short.  Agree with oral steroids for alcohol hepatitis.  Will follow with you.

## 2015-10-07 NOTE — Progress Notes (Addendum)
Pharmacy Consult for electrolyte supplementation Indication: hypokalemia  No Known Allergies  Patient Measurements: Height: 5\' 10"  (177.8 cm) Weight: 181 lb 8 oz (82.3 kg) IBW/kg (Calculated) : 73  Vital Signs: Temp: 98 F (36.7 C) (08/04 1317) Temp Source: Oral (08/04 1317) BP: 131/77 (08/04 1317) Pulse Rate: 110 (08/04 1317) Intake/Output from previous day: 08/03 0701 - 08/04 0700 In: 725 [I.V.:725] Out: 200 [Urine:200] Intake/Output from this shift: No intake/output data recorded.  Labs:  Recent Labs  10/05/15 1137 10/05/15 1156 10/06/15 0453 10/07/15 0448  WBC  --  13.1* 10.8* 12.8*  HGB  --  12.7* 11.0* 11.0*  HCT  --  36.7* 30.8* 31.4*  PLT  --  165 145* 148*  APTT 33  --   --   --   INR 0.98  --   --   --      Recent Labs  10/05/15 1156 10/06/15 0453 10/06/15 1824 10/07/15 0448 10/07/15 0728  NA 130* 135  --  132* 132*  K 2.7* 3.5 3.2* 3.0* 3.1*  CL 89* 98*  --  101 102  CO2 26 30  --  24 23  GLUCOSE 126* 93  --  80 78  BUN <5* 6  --  <5* <5*  CREATININE <0.30* <0.30*  --  <0.30* <0.30*  CALCIUM 9.0 8.2*  --  7.7* 7.8*  MG 1.3*  --  1.7  --   --   PHOS  --   --   UNABLE TO REPORT DUE TO ICTERUS  --   --   PROT 7.3 6.0*  --  5.8*  --   ALBUMIN 2.7* 2.2*  --  2.1*  --   AST 435* 321*  --  242*  --   ALT 237* 178*  --  141*  --   ALKPHOS 353* 292*  --  265*  --   BILITOT 23.9* 21.8*  --  20.6*  --    CrCl cannot be calculated (This lab value cannot be used to calculate CrCl because it is not a number: <0.30).   No results for input(s): GLUCAP in the last 72 hours.  Assessment: Pharmacy consulted to monitor/replace electrolytes in this 60 year old male with hypokalemia admitted for alcoholic hepatitis  Plan:  Potassium and magnesium replaced overnight. Morning sample hemolyzed. Will recheck electrolytes this evening and replace as needed  8/3 @ 18:00 :  K = 3.2, Mag = 1.7, Phos = "unable to be reported" due to icterus.   Will order KCl 10  mEq IV X 4 and recheck K on 8/4 with AM labs.   Dominik Lauricella D 10/07/2015,8:03 PM  0804 0448 K 3, Mg and phos not reported. Ordered potassium chloride 40 mEq IV x 1, will recheck electrolytes after infusion. Nathan A. [place], [place].D., BCPS  [phone] Electrolytes not reported , presumably due to elevated bilirubin.   Will recheck electrolytes on 8/5 with AM labs.

## 2015-10-07 NOTE — Progress Notes (Signed)
[facility] [place] [facility]   PATIENT NAME: Devin Bryant    MR#:  [id number]  DATE OF BIRTH:  1955-07-24  SUBJECTIVE:  CHIEF COMPLAINT:   Chief Complaint  Patient presents with  . abdominal distention  . Anorexia  Slowly improving. Was somewhat agitated y'day and started on CIWA, calm now. REVIEW OF SYSTEMS:  Review of Systems  Constitutional: Positive for malaise/fatigue. Negative for chills, fever and weight loss.  HENT: Negative for nosebleeds and sore throat.   Eyes: Negative for blurred vision.  Respiratory: Negative for cough, shortness of breath and wheezing.   Cardiovascular: Negative for chest pain, orthopnea, leg swelling and PND.  Gastrointestinal: Negative for abdominal pain, constipation, diarrhea, heartburn, nausea and vomiting.  Genitourinary: Negative for dysuria and urgency.  Musculoskeletal: Negative for back pain.  Skin: Negative for rash.  Neurological: Positive for weakness. Negative for dizziness, speech change, focal weakness and headaches.  Endo/Heme/Allergies: Does not bruise/bleed easily.  Psychiatric/Behavioral: Negative for depression.    DRUG ALLERGIES:  No Known Allergies VITALS:  Blood pressure 131/77, pulse (!) 110, temperature 98 F (36.7 C), temperature source Oral, resp. rate 18, height 5\' 10"  (1.778 m), weight 82.3 kg (181 lb 8 oz), SpO2 97 %. PHYSICAL EXAMINATION:  Physical Exam  Constitutional: He is oriented to person, place, and time and well-developed, well-nourished, and in no distress.  HENT:  Head: Normocephalic and atraumatic.  Eyes: Conjunctivae and EOM are normal. Pupils are equal, round, and reactive to light.  Neck: Normal range of motion. Neck supple. No tracheal deviation present. No thyromegaly present.  Cardiovascular: Normal rate, regular rhythm and normal heart sounds.   Pulmonary/Chest: Effort normal and breath sounds normal. No respiratory distress. He has no wheezes. He exhibits no  tenderness.  Abdominal: Soft. Bowel sounds are normal. He exhibits no distension. There is no tenderness.  Musculoskeletal: Normal range of motion.  Neurological: He is alert and oriented to person, place, and time. No cranial nerve deficit.  Skin: Skin is warm, dry and intact. No rash noted.  Jaundiced throughout. Patient also has psoriasis face and arms  Psychiatric: Mood and affect normal.  Nursing note and vitals reviewed.  LABORATORY PANEL:   CBC  Recent Labs Lab 10/07/15 0448  WBC 12.8*  HGB 11.0*  HCT 31.4*  PLT 148*   ------------------------------------------------------------------------------------------------------------------ Chemistries   Recent Labs Lab 10/06/15 1824 10/07/15 0448 10/07/15 0728  NA  --  132* 132*  K 3.2* 3.0* 3.1*  CL  --  101 102  CO2  --  24 23  GLUCOSE  --  80 78  BUN  --  <5* <5*  CREATININE  --  <0.30* <0.30*  CALCIUM  --  7.7* 7.8*  MG 1.7  --   --   AST  --  242*  --   ALT  --  141*  --   ALKPHOS  --  265*  --   BILITOT  --  20.6*  --    RADIOLOGY:  No results found. ASSESSMENT AND PLAN:  60 year old male with a known history of anxiety, COPD, GERD is admitted for  1. Painless jaundice. Ultrasound of the abdomen showing no visible stones in the ducts. Likely fatty infiltration of liver or intrinsic liver disease. - MRI of the abdomen shows Hepatomegaly with hepatic steatosis. Appreciate GI consult input - likely will need outpt Hepatology eval - LFTs improving slowly - trending down (T bili 23.9--> 20.6, AST 435-->242).   2. Alcoholic hepatitis. Continued  alcohol abuse.  Neg hepatitis panel. -  Patient advised that he can no longer drink. Ciwa protocol ordered. - started aldactone  qd for ascites/LE edema.  - also Prednisone  qd for alcoholic hepatitis per GI  3. Hypokalemia - Replete and recheck - check mg  4. Hyponatremia: - Resolved with IV hydration, Likely dehydration related.  5. Elevated liver  function test. Likely related to alcohol continue to trend.  Pending hepatitis profiles.  6. COPD. Continue inhalers and nebulizer treatments.  7. Psoriasis: Continue Otezla at home.  8. Anxiety/depression: continue Paxil and when necessary Klonopin.  9. Thrush: nystatin swish and swallow  10. Tobacco abuse smoking cessation counseling done 3 minutes by Dr Renae Gloss and nicotine patch applied  11. Lactic acidosis: resolved  12. Elevated troponin likely demand ischemia  13. Alcohol withdrawal: On CIWA    All the records are reviewed and case discussed with Care Management/Social Worker. Management plans discussed with the patient, family and they are in agreement.  CODE STATUS: Full code  TOTAL TIME TAKING CARE OF THIS PATIENT: 35 minutes.   More than 50% of the time was spent in counseling/coordination of care: YES  POSSIBLE D/C IN 1-2 DAYS, DEPENDING ON CLINICAL CONDITION.   [facility], Devin Bryant M.D on 10/07/2015 at 3:11 PM  Between 7am to 6pm - Pager - [phone]  After 6pm go to [email or web address] - Social research officer, government  [facility]  Office  [phone]  CC: Primary care physician; Devin Gouty, MD  Note: This dictation was prepared with Dragon dictation along with smaller phrase technology. Any transcriptional errors that result from this process are unintentional.

## 2015-10-07 NOTE — Consult Note (Signed)
GI Inpatient Consult Note  Reason for Consult:   Attending Requesting Consult:  History of Present Illness: Devin Bryant is a 60 y.o. male seen for evaluation of elevated LFTS at the request of Dr. Sherryll Burger. PMhx of COPD, anxiety, GERD, HTN, HLD.   He was admitted on 8/2 for abdominal distention and jaundice.   He reports about 4 weeks ago developing diffuse malaise, fatigue, poor appetite, mild nausea. Associated with 6 lb weight loss despite feeling very distended in abdominal area. He denies any abdominal pain. He had 2 days of vomiting 2 weeks ago but this resolved quickly. Generally eating less than normal over past few weeks.  About 1 week ago he developed jaundice, this was first episode of jaundice. He also had a day of white stools 4 days ago which prompted him to come to ER. No chronic GI complaints except GERD which is well controlled on Neixum.   He usually drinks 5-6 drinks/night - mainly wine & liquor. 30+ year alcohol abuse history. Has been to substance abuse treatment prior but quickly relapsed. Reports increased stress related to work over past 6 months and drinking more than normal for him. He denies any heavy tylenol use, recent ABX, Iv/intranasal drug use, new OTC/herbal supplements. Only new medication is Otezla for psoriasis, began few weeks ago. Father had cirrhosis from alcohol.    Hx of COPD - wears O2 at night and sometime in mornings. History of edema in lower extremities although none since admitted.   Last Colonoscopy: None prior  Last Endoscopy: None prior    Past Medical History:  Past Medical History:  Diagnosis Date  . Adjustment disorder with depressed mood   . Allergy   . Anxiety   . COPD (chronic obstructive pulmonary disease) (HCC)   . ED (erectile dysfunction)   . Elevated glucose   . Elevated serum glutamic pyruvic transaminase (SGPT) level   . Emphysema lung (HCC)   . GERD (gastroesophageal reflux disease)   . Hypertension   .  Hypertriglyceridemia   . Tobacco use     Problem List: Patient Active Problem List   Diagnosis Date Noted  . Jaundice 10/05/2015  . Acute on chronic respiratory failure (HCC) 06/27/2015  . Macrocytosis without anemia 04/11/2015  . Alcoholism in recovery (HCC) 04/04/2015  . Alcohol withdrawal (HCC) 03/11/2015  . COPD exacerbation (HCC) 10/19/2014  . Tobacco abuse 10/19/2014  . Anxiety disorder 10/17/2014  . COPD with acute exacerbation (HCC) 10/15/2014  . Allergy   . Hypertension   . COPD (chronic obstructive pulmonary disease) (HCC)   . Adjustment disorder with depressed mood   . ED (erectile dysfunction)   . Tobacco use   . Hypertriglyceridemia   . Elevated glucose   . Elevated serum glutamic pyruvic transaminase (SGPT) level     Past Surgical History: Past Surgical History:  Procedure Laterality Date  . KNEE SURGERY     arthroscopic  . SEPTOPLASTY  Feb 2016  . TONSILLECTOMY      Allergies: No Known Allergies  Home Medications: Prescriptions Prior to Admission  Medication Sig Dispense Refill Last Dose  . ADVAIR HFA 115-21 MCG/ACT inhaler Inhale 2 puffs into the lungs 2 (two) times daily.    Past Week at Unknown time  . albuterol (PROVENTIL HFA;VENTOLIN HFA) 108 (90 BASE) MCG/ACT inhaler Inhale 2 puffs into the lungs every 6 (six) hours as needed for wheezing or shortness of breath.    prn at prn  . albuterol (PROVENTIL) (2.5 MG/3ML) 0.083% nebulizer solution  Take 3 mLs (2.5 mg total) by nebulization every 4 (four) hours as needed for wheezing or shortness of breath. 50 mL 3 prn at prn  . aspirin EC 81 MG tablet Take 81 mg by mouth daily. Reported on 06/27/2015   Not Taking at Unknown time  . Cetirizine HCl (ZYRTEC ALLERGY) 10 MG CAPS Take 10 mg by mouth daily.   prn at prn  . Clobetasol Propionate 0.05 % shampoo Apply 1 application topically as needed.    prn at prn  . clonazePAM (KLONOPIN) 0.5 MG tablet Take 1 tablet (0.5 mg total) by mouth 2 (two) times daily as needed  for anxiety. 6 tablet 0 prn at prn  . CORDRAN 0.05 % lotion Apply 1 application topically 2 (two) times daily. Reported on 04/04/2015   prn at prn  . Desoximetasone (TOPICORT SPRAY EX) Apply 1 application topically 2 (two) times daily as needed (scorisis/irritation over body. Use all over except for on face.). Reported on 04/04/2015   prn at prn  . esomeprazole (NEXIUM) 20 MG capsule Take 20 mg by mouth daily at 12 noon.   06/27/2015 at Unknown time  . fluticasone (FLONASE) 50 MCG/ACT nasal spray Place 1 spray into both nostrils daily.    06/27/2015 at Unknown time  . gabapentin (NEURONTIN) 300 MG capsule Take 300 mg by mouth 2 (two) times daily.   06/27/2015 at Unknown time  . guaiFENesin-codeine 100-10 MG/5ML syrup Take 5 mLs by mouth every 4 (four) hours as needed for cough. 180 mL 0   . ketoconazole (NIZORAL) 2 % cream 1 APPLICATION APPLY ON THE SKIN DAILY  3 prn at prn  . mometasone (ELOCON) 0.1 % ointment Apply 1 application topically daily as needed (for ear irritation/eczema in ear). Apply to each ear 1 drop daily as needed   prn at prn  . Multiple Vitamin (MULTIVITAMIN) tablet Take 1 tablet by mouth daily.   06/27/2015 at Unknown time  . nystatin (MYCOSTATIN) 100000 UNIT/ML suspension Take 5 mLs by mouth 4 (four) times daily as needed. thrush  13 prn at prn  . PARoxetine (PAXIL) 20 MG tablet Take 20 mg by mouth daily.   06/27/2015 at Unknown time  . predniSONE (DELTASONE) 10 MG tablet 40 mg po daily for 2 days, 20 mg po daily for 2 days, 10 mg po daily for 2 days, 14 tablet 0   . theophylline (THEODUR) 300 MG 12 hr tablet Take 300 mg by mouth 2 (two) times daily.  11 06/27/2015 at Unknown time  . tiotropium (SPIRIVA HANDIHALER) 18 MCG inhalation capsule Place 1 capsule (18 mcg total) into inhaler and inhale daily. 30 capsule 0 06/27/2015 at Unknown time  . zolpidem (AMBIEN) 5 MG tablet Take 1 tablet (5 mg total) by mouth at bedtime as needed for sleep. 6 tablet 0    Home medication reconciliation  was completed with the patient.   Scheduled Inpatient Medications:   . fluticasone  1 spray Each Nare Daily  . folic acid  1 mg Oral Daily  . heparin subcutaneous  5,000 Units Subcutaneous Q8H  . loratadine  10 mg Oral Daily  . LORazepam  0.5 mg Intravenous Once  . mometasone-formoterol  2 puff Inhalation BID  . multivitamin with minerals  1 tablet Oral Daily  . nicotine  14 mg Transdermal Daily  . nystatin  5 mL Oral QID  . pantoprazole  40 mg Oral Daily  . PARoxetine  20 mg Oral Daily  . potassium chloride 40 mEq  in sodium chloride 0.45% 500 mL infusion   Intravenous Once  . sodium chloride flush  3 mL Intravenous Q12H  . theophylline  300 mg Oral BID  . thiamine  100 mg Oral Daily   Or  . thiamine  100 mg Intravenous Daily  . tiotropium  18 mcg Inhalation Daily    Continuous Inpatient Infusions:   . sodium chloride 50 mL/hr at 10/06/15 1808    PRN Inpatient Medications:  albuterol, clonazePAM, LORazepam **OR** LORazepam, triamcinolone ointment, zolpidem  Family History: family history includes CAD in his mother; COPD in his brother and sister; Cancer in his father; Diabetes in his mother; Heart attack in his brother and father; Heart disease in his brother and father. Father w/ alcoholic cirrhosis.    Social History:   reports that he has been smoking Cigarettes.  He has a 20.00 pack-year smoking history. He has never used smokeless tobacco. He reports that he drinks alcohol. He reports that he uses drugs, including Marijuana.   Review of Systems: Constitutional: Weight is stable.  Eyes: No changes in vision. ENT: No oral lesions, sore throat.  GI: see HPI.  Heme/Lymph: No easy bruising.  CV: No chest pain.  GU: No hematuria.  Integumentary: No rashes.  Neuro: No headaches.  Psych: No depression/anxiety.  Endocrine: No heat/cold intolerance.  Allergic/Immunologic: No urticaria.  Resp: No cough, SOB.  Musculoskeletal: No joint swelling.    Physical  Examination: BP (!) 150/97 (BP Location: Right Arm)   Pulse (!) 116   Temp 99.3 F (37.4 C) (Oral)   Resp 16   Ht  (1.778 m)   Wt 82.3 kg (181 lb 8 oz)   SpO2 97%   BMI 26.04 kg/m  Gen: NAD, alert and oriented x 4, obvious jaundice.  HEENT: PEERLA, EOMI, Neck: supple, no JVD or thyromegaly Chest: CTA bilaterally, no wheezes, crackles, or other adventitious sounds CV: RRR, no m/g/c/r Abd: NT, ND, +BS in all four quadrants; no HSM, guarding, ridigity, or rebound tenderness. Minimal abd distention.  Ext: no edema, well perfused with 2+ pulses, Skin: no rash or lesions noted Lymph: no LAD  Data: Lab Results  Component Value Date   WBC 12.8 (H) 10/07/2015   HGB 11.0 (L) 10/07/2015   HCT 31.4 (L) 10/07/2015   MCV 123.0 (H) 10/07/2015   PLT 148 (L) 10/07/2015    Recent Labs Lab 10/05/15 1156 10/06/15 0453 10/07/15 0448  HGB 12.7* 11.0* 11.0*   Lab Results  Component Value Date   NA 132 (L) 10/07/2015   K 3.1 (L) 10/07/2015   CL 102 10/07/2015   CO2 23 10/07/2015   BUN <5 (L) 10/07/2015   CREATININE <0.30 (L) 10/07/2015   Lab Results  Component Value Date   ALT 141 (H) 10/07/2015   AST 242 (H) 10/07/2015   ALKPHOS 265 (H) 10/07/2015   BILITOT 20.6 (HH) 10/07/2015    Recent Labs Lab 10/05/15 1137  APTT 33  INR 0.98    MRCP - IMPRESSION: 1. Hepatomegaly with hepatic steatosis. 2. No suspicious hepatic lesions. 3. No evidence of biliary tract obstruction. 4. Trace volume of ascites. 5. Trace right pleural effusion with probable subsegmental atelectasis in the right lower lobe.  Complete Abd Ultrasound: Diffusely increased echotexture throughout the liver compatible with fatty infiltration or intrinsic liver disease. Liver appears enlarged. Mild gallbladder wall thickening without visible stones or sonographic Murphy's sign.   Assessment/Plan: Mr. Amedee is a 60 y.o. male admitted for LFTS.   1. Abnormal  LFTS - trending down (T bili 23.9--> 20.6,  AST 435-->242). AST was 2x ALT on admission supporting alcoholic liver disease. INR normal. Borderline thrombocytopenia. Viral hepatitis panel normal. MELD 19. DF on admission was 32 but has decreased to 28, per MAR prednisolone not started. Will start prednisone 30mg . Alcohol abstinence critical to long term survival.   2. Ascites/lower extremity edema - traces ascites on MRCP and he reports significant swelling in his bilateral extremities, hesitant for lasix given hypokalemia, will start aldactone 25mg , can increase as needed.    Recommendations:  1. Follow LFTS and INR daily 2. Start aldactone 25mg  qd for ascites/LE edema.  3. Prednisone 30mg  qd for alcoholic hepatitis   *Case discussed and treatment plan formulated w/ Dr. Mechele Collin.   Thank you for the consult. Please call with questions or concerns.  Bluford Kaufmann, PA-C [facility] GI

## 2015-10-08 ENCOUNTER — Inpatient Hospital Stay
Admit: 2015-10-08 | Discharge: 2015-10-08 | Disposition: A | Discharge status: Home / Self Care | Payer: BLUE CROSS/BLUE SHIELD | Attending: Internal Medicine | Admitting: Internal Medicine

## 2015-10-08 DIAGNOSIS — F10239 Alcohol dependence with withdrawal, unspecified: Secondary | ICD-10-CM

## 2015-10-08 LAB — BASIC METABOLIC PANEL
Anion gap: 6 (ref 5–15)
CALCIUM: 7.9 mg/dL — AB (ref 8.9–10.3)
CHLORIDE: 106 mmol/L (ref 101–111)
CO2: 23 mmol/L (ref 22–32)
Glucose, Bld: 112 mg/dL — ABNORMAL HIGH (ref 65–99)
Potassium: 3.5 mmol/L (ref 3.5–5.1)
SODIUM: 135 mmol/L (ref 135–145)

## 2015-10-08 LAB — COMPREHENSIVE METABOLIC PANEL
ALBUMIN: 2.2 g/dL — AB (ref 3.5–5.0)
ALT: 132 U/L — AB (ref 17–63)
ANION GAP: 8 (ref 5–15)
AST: 212 U/L — ABNORMAL HIGH (ref 15–41)
Alkaline Phosphatase: 301 U/L — ABNORMAL HIGH (ref 38–126)
BUN: <5 mg/dL — ABNORMAL LOW (ref 6–20)
CHLORIDE: 102 mmol/L (ref 101–111)
CO2: 24 mmol/L (ref 22–32)
Calcium: 7.9 mg/dL — ABNORMAL LOW (ref 8.9–10.3)
Creatinine, Ser: <0.3 mg/dL — ABNORMAL LOW (ref 0.61–1.24)
GLUCOSE: 111 mg/dL — AB (ref 65–99)
Potassium: 3 mmol/L — ABNORMAL LOW (ref 3.5–5.1)
SODIUM: 134 mmol/L — AB (ref 135–145)
Total Bilirubin: 22.7 mg/dL (ref 0.3–1.2)
Total Protein: 6.1 g/dL — ABNORMAL LOW (ref 6.5–8.1)

## 2015-10-08 LAB — CBC
HCT: 33.5 % — ABNORMAL LOW (ref 40.0–52.0)
HEMOGLOBIN: 11.5 g/dL — AB (ref 13.0–18.0)
MCH: 42.6 pg — AB (ref 26.0–34.0)
MCHC: 34.4 g/dL (ref 32.0–36.0)
MCV: 123.8 fL — ABNORMAL HIGH (ref 80.0–100.0)
PLATELETS: 154 10*3/uL (ref 150–440)
RBC: 2.71 MIL/uL — AB (ref 4.40–5.90)
RDW: 18.9 % — ABNORMAL HIGH (ref 11.5–14.5)
WBC: 11.7 10*3/uL — ABNORMAL HIGH (ref 3.8–10.6)

## 2015-10-08 LAB — AMMONIA: AMMONIA: 66 umol/L — AB (ref 9–35)

## 2015-10-08 MED ORDER — PAROXETINE HCL 10 MG PO TABS
10.0000 mg | ORAL_TABLET | Freq: Every day | ORAL | Status: DC
  Administered 2015-10-09 – 2015-10-10 (×2): 10 mg via ORAL
  Filled 2015-10-08 (×2): qty 1

## 2015-10-08 MED ORDER — MAGNESIUM OXIDE 400 (241.3 MG) MG PO TABS
200.0000 mg | ORAL_TABLET | Freq: Two times a day (BID) | ORAL | Status: AC
  Administered 2015-10-08 (×2): 200 mg via ORAL
  Filled 2015-10-08: qty 1
  Filled 2015-10-08: qty 2

## 2015-10-08 MED ORDER — LACTULOSE 10 GM/15ML PO SOLN
30.0000 g | Freq: Every day | ORAL | Status: DC
  Administered 2015-10-08: 30 g via ORAL
  Filled 2015-10-08: qty 60

## 2015-10-08 MED ORDER — RIFAXIMIN 550 MG PO TABS
550.0000 mg | ORAL_TABLET | Freq: Two times a day (BID) | ORAL | Status: DC
  Administered 2015-10-08 – 2015-10-10 (×5): 550 mg via ORAL
  Filled 2015-10-08 (×5): qty 1

## 2015-10-08 MED ORDER — SODIUM CHLORIDE 0.45 % IV SOLN
Freq: Once | INTRAVENOUS | Status: AC
  Administered 2015-10-08: 09:00:00 via INTRAVENOUS
  Filled 2015-10-08: qty 500

## 2015-10-08 MED ORDER — MIRTAZAPINE 15 MG PO TABS
15.0000 mg | ORAL_TABLET | Freq: Every day | ORAL | Status: DC
  Administered 2015-10-08 – 2015-10-09 (×2): 15 mg via ORAL
  Filled 2015-10-08 (×2): qty 1

## 2015-10-08 NOTE — Consult Note (Signed)
Patient with alcoholic liver disease with TB 22, hgb 11.5, WBC 11.7 Plt 154, NH3 66 akb 2.2 K 3.  ALT and AST are falling. Pt denies any new complaints, is oriented to place.  Steroids started yesterday.  No new suggestions.
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
## 2015-10-08 NOTE — Consult Note (Addendum)
[facility] Consult   Reason for Consult:  Alcohol Dependence and Anxiety  Referring Physician:  Alvester Morin, MD Patient Identification: Devin Bryant MRN:  [id number] Principal Diagnosis: <principal problem not specified> Diagnosis:   Patient Active Problem List   Diagnosis Date Noted  . Jaundice [R17] 10/05/2015  . Acute on chronic respiratory failure ([place]) [J96.20] 06/27/2015  . Macrocytosis without anemia [D75.89] 04/11/2015  . Alcoholism in recovery ([place]) [F10.20] 04/04/2015  . Alcohol withdrawal ([place]) [F10.239] 03/11/2015  . COPD exacerbation ([place]) [J44.1] 10/19/2014  . Tobacco abuse [Z72.0] 10/19/2014  . Generalized anxiety disorder [F41.1] 10/17/2014  . COPD with acute exacerbation ([place]) [J44.1] 10/15/2014  . Allergy [T78.40XA]   . Hypertension [I10]   . COPD (chronic obstructive pulmonary disease) ([place]) [J44.9]   . Adjustment disorder with depressed mood [F43.21]   . ED (erectile dysfunction) [N52.9]   . Tobacco use [Z72.0]   . Hypertriglyceridemia [E78.1]   . Elevated glucose [R73.09]   . Elevated serum glutamic pyruvic transaminase (SGPT) level [R74.0]     Total Time spent with patient: 45 minutes  Subjective:   Devin Bryant is 60 year old married Caucasian male with a long history of alcohol dependence who was admitted to the medicine service secondary to jaundice and alcoholic liver disease. The patient reports that he was drinking heavily for the past 5-6 years, approximately 5-6 glasses of wine and vodka per day. Drinking has escalated over the past 6 months. He says when he gets home from work he drinks every evening in order to decrease his anxiety and to sleep. The patient says he does struggle with a lot of anxiety and has had multiple stressors contributing to this facility drinking.Marland Kitchen He has limitations secondary to COPD and is on oxygen at home as needed. 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 The patient says that alcohol has progressively worsened over the past 5 years. Prior to 5 years ago, he considered himself "a functional alcoholic". He does report problems with tremors when he is not drinking and does have a history of alcohol-related blackouts. He denies any history of any DUIs. No history of any delirium tremens, just tremors. The patient has been on Paxil for anxiety for the past 15 years as well as Klonopin and Ambien. He says he has a difficult time sleeping without the Ambien but his PCP recently cut his Ambien off. The patient denies any current severe depressive symptoms but has struggled with some mild depression in the past. He does have a strong history of alcoholism in depression in his family. The patient says that 3 of his eighth sibling committed suicide. He denies any history of any symptoms consistent with bipolar mania including grandiose delusions, decreased sleep with increased goal-directed behavior, hyperreligious thoughts or hypersexual behavior. He denies any history of any psychosis including auditory or visual hallucinations. No paranoid thoughts or delusions. The patient says he did go to a residential substance abuse treatment center in [place] in January of this year. He did stay for the full 28 days but relapsed about 14 days after release. He has never seen a psychiatrist in the community and has been getting Paxil, Ambien and Klonopin from his PCP. While he does admit to heavy alcohol use, he denies any illicit drug use including cocaine, marijuana, opiates, or stimulant use. He lives with his wife in the area and works part-time in [employer]. He denies any marital  conflict. Both of his daughters were at the bedside and are very supportive of him getting substance abuse treatment.  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 LFTs are slowly trending down. He is currently being followed by GI  but will most likely need a hepatology evaluation   Past psychiatric history The patient denies any prior inpatient psychiatric hospitalizations or suicide attempts but has been to residential substance abuse treatment in [place] in January 2017 for 28 days. He has been on Paxil, Klonopin and Ambien for over 10 years.  Substance abuse history: As stated in history of present illness, the patient does have a history of alcohol dependence. He is currently drinking 5-6 drinks wine and vodka on a daily basis for the past 5-6 years and prior to that was also drinking daily but not as heavily. He denies any history of any cocaine, opiate, stimulant, or marijuana use.  Family psychiatric history: The patient reports that 3 of his 8 siblings committed suicide. He says 3 of his brothers and one sister also struggle with alcohol dependence. His father was also an alcoholic. He does smoke one pack of cigarettes per day for over 20 years.   The patient denies any history of any prior DUIs. Pending charges.  *  Risk to Self: Is patient at risk for suicide?: No; Accidental risk due to alcohol use Risk to Others:  If he drinks and drives Prior Inpatient Therapy:  Residential Rehab Prior Outpatient Therapy:  No  Past Medical History:  Past Medical History:  Diagnosis Date  . Adjustment disorder with depressed mood   . Allergy   . Anxiety   . COPD (chronic obstructive pulmonary disease) ([place])   . ED (erectile dysfunction)   . Elevated glucose   . Elevated serum glutamic pyruvic transaminase (SGPT) level   . Emphysema lung ([place])   . GERD (gastroesophageal reflux disease)   . Hypertension   . Hypertriglyceridemia   . Tobacco use     Past Surgical History:  Procedure Laterality Date  . KNEE SURGERY     arthroscopic  . SEPTOPLASTY  Feb 2016  . TONSILLECTOMY     Family History:  Family History  Problem Relation Age of Onset  . Diabetes Mother   . CAD Mother   . Cancer Father     lung  .  Heart disease Father   . Heart attack Father   . COPD Sister   . Heart disease Brother   . Heart attack Brother   . COPD Brother   . Hypertension Neg Hx   . Stroke Neg Hx     Social History:  History  Alcohol Use  . Yes    Comment: daughter states patient has been drinking heavily for the past three years with increased drinking over the past 6 months.     History  Drug Use  . Types: Marijuana    Social History   Social History  . Marital status: Married    Spouse name: N/A  . Number of children: N/A  . Years of education: N/A   Social History Main Topics  . Smoking status: Current Every Day Smoker    Packs/day: 0.50    Years: 40.00    Types: Cigarettes  . Smokeless tobacco: Never Used  . Alcohol use Yes     Comment: daughter states patient has been drinking heavily for the past three years with increased drinking over the past 6 months.  . Drug use:  Types: Marijuana  . Sexual activity: Not Asked   Other Topics Concern  . None   Social History Narrative  . None       Allergies:  No Known Allergies  Labs:  Results for orders placed or performed during the hospital encounter of 10/05/15 (from the past 48 hour(s))  Potassium     Status: Abnormal   Collection Time: 10/06/15  6:24 PM  Result Value Ref Range   Potassium 3.2 (L) 3.5 - 5.1 mmol/L    Comment: HEMOLYSIS AT THIS LEVEL MAY AFFECT RESULT  Magnesium     Status: None   Collection Time: 10/06/15  6:24 PM  Result Value Ref Range   Magnesium 1.7 1.7 - 2.4 mg/dL  Phosphorus     Status: None   Collection Time: 10/06/15  6:24 PM  Result Value Ref Range   Phosphorus  UNABLE TO REPORT DUE TO ICTERUS 2.5 - 4.6 mg/dL  CBC     Status: Abnormal   Collection Time: 10/07/15  4:48 AM  Result Value Ref Range   WBC 12.8 (H) 3.8 - 10.6 K/uL   RBC 2.55 (L) 4.40 - 5.90 MIL/uL   Hemoglobin 11.0 (L) 13.0 - 18.0 g/dL   HCT 31.4 (L) 40.0 - 52.0 %   MCV 123.0 (H) 80.0 - 100.0 fL   MCH 43.2 (H) 26.0 - 34.0 pg    MCHC 35.1 32.0 - 36.0 g/dL   RDW 18.6 (H) 11.5 - 14.5 %   Platelets 148 (L) 150 - 440 K/uL  Comprehensive metabolic panel     Status: Abnormal   Collection Time: 10/07/15  4:48 AM  Result Value Ref Range   Sodium 132 (L) 135 - 145 mmol/L   Potassium 3.0 (L) 3.5 - 5.1 mmol/L   Chloride 101 101 - 111 mmol/L   CO2 24 22 - 32 mmol/L   Glucose, Bld 80 65 - 99 mg/dL   BUN <5 (L) 6 - 20 mg/dL   Creatinine, Ser <0.30 (L) 0.61 - 1.24 mg/dL    Comment: ICTERUS AT THIS LEVEL MAY AFFECT RESULT   Calcium 7.7 (L) 8.9 - 10.3 mg/dL   Total Protein 5.8 (L) 6.5 - 8.1 g/dL   Albumin 2.1 (L) 3.5 - 5.0 g/dL   AST 242 (H) 15 - 41 U/L   ALT 141 (H) 17 - 63 U/L    Comment: ICTERUS AT THIS LEVEL MAY AFFECT RESULT   Alkaline Phosphatase 265 (H) 38 - 126 U/L   Total Bilirubin 20.6 (HH) 0.3 - 1.2 mg/dL    Comment: CRITICAL RESULT CALLED TO, READ BACK BY AND VERIFIED WITH SARAH KLENNER ON 10/07/15 AT 0552 [place]    GFR calc non Af Amer NOT CALCULATED >60 mL/min   GFR calc Af Amer NOT CALCULATED >60 mL/min    Comment: (NOTE) The eGFR has been calculated using the CKD EPI equation. This calculation has not been validated in all clinical situations. eGFR's persistently <60 mL/min signify possible Chronic Kidney Disease.    Anion gap 7 5 - 15  Basic metabolic panel     Status: Abnormal   Collection Time: 10/07/15  7:28 AM  Result Value Ref Range   Sodium 132 (L) 135 - 145 mmol/L   Potassium 3.1 (L) 3.5 - 5.1 mmol/L   Chloride 102 101 - 111 mmol/L   CO2 23 22 - 32 mmol/L   Glucose, Bld 78 65 - 99 mg/dL   BUN <5 (L) 6 - 20 mg/dL   Creatinine, Ser <  0.30 (L) 0.61 - 1.24 mg/dL    Comment: ICTERUS AT THIS LEVEL MAY AFFECT RESULT   Calcium 7.8 (L) 8.9 - 10.3 mg/dL   GFR calc non Af Amer NOT CALCULATED >60 mL/min   GFR calc Af Amer NOT CALCULATED >60 mL/min    Comment: (NOTE) The eGFR has been calculated using the CKD EPI equation. This calculation has not been validated in all clinical situations. eGFR's  persistently <60 mL/min signify possible Chronic Kidney Disease.    Anion gap 7 5 - 15  CBC     Status: Abnormal   Collection Time: 10/08/15  4:18 AM  Result Value Ref Range   WBC 11.7 (H) 3.8 - 10.6 K/uL   RBC 2.71 (L) 4.40 - 5.90 MIL/uL   Hemoglobin 11.5 (L) 13.0 - 18.0 g/dL   HCT 33.5 (L) 40.0 - 52.0 %   MCV 123.8 (H) 80.0 - 100.0 fL   MCH 42.6 (H) 26.0 - 34.0 pg   MCHC 34.4 32.0 - 36.0 g/dL   RDW 18.9 (H) 11.5 - 14.5 %   Platelets 154 150 - 440 K/uL  Comprehensive metabolic panel     Status: Abnormal   Collection Time: 10/08/15  4:18 AM  Result Value Ref Range   Sodium 134 (L) 135 - 145 mmol/L   Potassium 3.0 (L) 3.5 - 5.1 mmol/L   Chloride 102 101 - 111 mmol/L   CO2 24 22 - 32 mmol/L   Glucose, Bld 111 (H) 65 - 99 mg/dL   BUN <5 (L) 6 - 20 mg/dL   Creatinine, Ser <0.30 (L) 0.61 - 1.24 mg/dL    Comment: ICTERUS AT THIS LEVEL MAY AFFECT RESULT   Calcium 7.9 (L) 8.9 - 10.3 mg/dL   Total Protein 6.1 (L) 6.5 - 8.1 g/dL   Albumin 2.2 (L) 3.5 - 5.0 g/dL   AST 212 (H) 15 - 41 U/L   ALT 132 (H) 17 - 63 U/L    Comment: ICTERUS AT THIS LEVEL MAY AFFECT RESULT   Alkaline Phosphatase 301 (H) 38 - 126 U/L   Total Bilirubin 22.7 (HH) 0.3 - 1.2 mg/dL    Comment: CRITICAL VALUE NOTED. VALUE IS CONSISTENT WITH PREVIOUSLY REPORTED/CALLED VALUE   GFR calc non Af Amer NOT CALCULATED >60 mL/min   GFR calc Af Amer NOT CALCULATED >60 mL/min    Comment: (NOTE) The eGFR has been calculated using the CKD EPI equation. This calculation has not been validated in all clinical situations. eGFR's persistently <60 mL/min signify possible Chronic Kidney Disease.    Anion gap 8 5 - 15  Ammonia     Status: Abnormal   Collection Time: 10/08/15  8:14 AM  Result Value Ref Range   Ammonia 66 (H) 9 - 35 umol/L    Comment: ICTERUS AT THIS LEVEL MAY AFFECT RESULT  Basic metabolic panel     Status: Abnormal   Collection Time: 10/08/15  1:09 PM  Result Value Ref Range   Sodium 135 135 - 145 mmol/L    Potassium 3.5 3.5 - 5.1 mmol/L   Chloride 106 101 - 111 mmol/L   CO2 23 22 - 32 mmol/L   Glucose, Bld 112 (H) 65 - 99 mg/dL   BUN <5 (L) 6 - 20 mg/dL   Creatinine, Ser <0.30 (L) 0.61 - 1.24 mg/dL    Comment: ICTERUS AT THIS LEVEL MAY AFFECT RESULT   Calcium 7.9 (L) 8.9 - 10.3 mg/dL   GFR calc non Af Amer NOT CALCULATED >60 mL/min  GFR calc Af Amer NOT CALCULATED >60 mL/min    Comment: (NOTE) The eGFR has been calculated using the CKD EPI equation. This calculation has not been validated in all clinical situations. eGFR's persistently <60 mL/min signify possible Chronic Kidney Disease.    Anion gap 6 5 - 15    Current Facility-Administered Medications  Medication Dose Route Frequency Provider Last Rate Last Dose  . albuterol (PROVENTIL) (2.5 MG/3ML) 0.083% nebulizer solution 2.5 mg  2.5 mg Nebulization Q4H PRN Loletha Grayer, MD      . enoxaparin (LOVENOX) injection 40 mg  40 mg Subcutaneous Q24H Max Sane, MD   40 mg at 10/07/15 2204  . fluticasone (FLONASE) 50 MCG/ACT nasal spray 1 spray  1 spray Each Nare Daily Loletha Grayer, MD   1 spray at 10/08/15 [phone]  . folic acid (FOLVITE) tablet 1 mg  1 mg Oral Daily Nena Polio, MD   1 mg at 10/08/15 0945  . gabapentin (NEURONTIN) capsule 300 mg  300 mg Oral BID Max Sane, MD   300 mg at 10/08/15 0945  . lactulose (CHRONULAC) 10 GM/15ML solution 30 g  30 g Oral Daily Theodoro Grist, MD   30 g at 10/08/15 1154  . loratadine (CLARITIN) tablet 10 mg  10 mg Oral Daily Loletha Grayer, MD   10 mg at 10/08/15 0945  . LORazepam (ATIVAN) injection 0.5 mg  0.5 mg Intravenous Once Loletha Grayer, MD      . LORazepam (ATIVAN) tablet 1 mg  1 mg Oral Q4H PRN Loletha Grayer, MD   1 mg at 10/07/15 1959   Or  . LORazepam (ATIVAN) injection 1 mg  1 mg Intravenous Q6H PRN Loletha Grayer, MD   1 mg at 10/08/15 0122  . magnesium oxide (MAG-OX) tablet 200 mg  200 mg Oral BID Theodoro Grist, MD   200 mg at 10/08/15 1153  . mirtazapine (REMERON) tablet  15 mg  15 mg Oral QHS Chauncey Mann, MD      . mometasone-formoterol [facility]) 200-5 MCG/ACT inhaler 2 puff  2 puff Inhalation BID Loletha Grayer, MD   2 puff at 10/08/15 [phone]  . multivitamin with minerals tablet 1 tablet  1 tablet Oral Daily Nena Polio, MD   1 tablet at 10/08/15 0945  . nicotine (NICODERM CQ - dosed in mg/24 hours) patch 14 mg  14 mg Transdermal Daily Loletha Grayer, MD   14 mg at 10/07/15 2158  . nystatin (MYCOSTATIN) 100000 UNIT/ML suspension 500,000 Units  5 mL Oral QID Loletha Grayer, MD   500,000 Units at 10/08/15 1612  . pantoprazole (PROTONIX) EC tablet 40 mg  40 mg Oral Daily Loletha Grayer, MD   40 mg at 10/08/15 0945  . [START ON 10/09/2015] PARoxetine (PAXIL) tablet 10 mg  10 mg Oral Daily Chauncey Mann, MD      . predniSONE (DELTASONE) tablet 30 mg  30 mg Oral Q breakfast Ronney Asters, PA-C   30 mg at 10/08/15 0853  . rifaximin (XIFAXAN) tablet 550 mg  550 mg Oral BID Theodoro Grist, MD   550 mg at 10/08/15 1153  . sodium chloride flush (NS) 0.9 % injection 3 mL  3 mL Intravenous Q12H Loletha Grayer, MD   3 mL at 10/08/15 0946  . spironolactone (ALDACTONE) tablet 25 mg  25 mg Oral Daily Ronney Asters, PA-C   25 mg at 10/08/15 0945  . theophylline (THEODUR) 12 hr tablet 300 mg  300 mg Oral BID Loletha Grayer, MD  300 mg at 10/08/15 0945  . thiamine (VITAMIN B-1) tablet 100 mg  100 mg Oral Daily Nena Polio, MD   100 mg at 10/08/15 0945   Or  . thiamine (B-1) injection 100 mg  100 mg Intravenous Daily Nena Polio, MD   100 mg at 10/07/15 1035  . tiotropium (SPIRIVA) inhalation capsule 18 mcg  18 mcg Inhalation Daily Loletha Grayer, MD   18 mcg at 10/08/15 0947  . triamcinolone ointment (KENALOG) 0.1 %   Topical Daily PRN Loletha Grayer, MD        Musculoskeletal: Strength & Muscle Tone: within normal limits  Gait & Station: normal Patient leans: N/A  Psychiatric Specialty Exam: Physical Exam  Review of Systems  Constitutional: Positive for  malaise/fatigue. Negative for chills, fever and weight loss.  HENT: Negative.  Negative for congestion, hearing loss and sore throat.   Eyes: Negative.  Negative for blurred vision, double vision, photophobia, pain and discharge.  Respiratory: Negative for cough, hemoptysis, sputum production, shortness of breath and wheezing.   Cardiovascular: Negative for chest pain, palpitations and orthopnea.  Gastrointestinal: Positive for abdominal pain and nausea. Negative for blood in stool, constipation, diarrhea, heartburn, melena and vomiting.  Genitourinary: Negative.  Negative for dysuria, frequency and urgency.  Musculoskeletal: Negative for back pain, joint pain, myalgias and neck pain.  Skin: Negative.  Negative for itching and rash.  Neurological: Positive for tremors and weakness. Negative for dizziness, tingling, focal weakness, seizures, loss of consciousness and headaches.  Endo/Heme/Allergies: Bruises/bleeds easily.  Psychiatric/Behavioral: Positive for substance abuse. The patient is nervous/anxious and has insomnia.     Blood pressure (!) 146/94, pulse (!) 109, temperature 98.5 F (36.9 C), temperature source Oral, resp. rate 20, height 5' 10"  (1.778 m), weight 82.3 kg (181 lb 8 oz), SpO2 97 %.Body mass index is 26.04 kg/m.  General Appearance: Casual  Eye Contact:  Good  Speech:  Clear and Coherent and Normal Rate  Volume:  Normal  Mood:  Anxious  Affect:  Anxious  Thought Process:  Coherent, Goal Directed and Linear  Orientation:  Full (Time, Place, and Person)  Thought Content:  Logical  Suicidal Thoughts:  No  Homicidal Thoughts:  No  Memory:  Immediate;   Good Recent;   Good Remote;   Good  Judgement:  Other:  Poor by history but good by testing  Insight:  Good  Psychomotor Activity:  Normal  Concentration:  Concentration: Good and Attention Span: Good  Recall:  Good  Fund of Knowledge:  Good  Language:  Good  Akathisia:  No  Handed:  Right  AIMS (if indicated):      Assets:  Communication Skills Desire for Improvement Financial Resources/Insurance Housing Social Support  ADL's:  Intact  Cognition:  WNL  Sleep:        Treatment Plan Summary:  DIAGNOSIS Generalized anxiety disorder R/O Major Depression Alcohol use disorder, severe Cirrhosis of the liver Mild: Recent loss of the business, financial concerns  Mr. Valentino is a 60 year old married Caucasian history of alcohol dependence was admitted to the mental secondary to jaundice and alcoholic liver disease. Drinking has escalated over the past 6 months he is currently experiencing withdrawal symptoms.  Generalized anxiety disorder, R/O Major Depression: I do believe he is minimizing his depressive symptoms and depression is playing a bigger role in him not drinking. He does endorse the anxiety but is denying depression. Collateral info from his family indicates that he is depressed. The patient will be titrated  down off of Paxil by decreasing dose of 10 mg by mouth daily for the next 7 days while starting Remeron 15 mg by mouth nightly for his depression, anxiety and insomnia. Once he is off the Paxil, can consider Celexa or Lexapro if Remeron alone does not help.  Will discontinue Klonopin as the patient should not be on scheduled benzodiazepines secondary to alcohol dependence. He should be detoxed by Ativan considering elevated liver enzymes and liver disease. Avoid Librium. We'll discontinue the Ambien as well secondary to COPD and alcohol dependence. We'll discontinue Ambien as the patient should not be on a sedative hypnotic secondary to both alcohol dependence as well as COPD. In addition to change of psychotropic medications, the patient should also pursue some CBT and mindfulness therapy to help decrease anxiety. Referral information was given for [facility] in [place].  Alcohol use disorder, severe: The patient will be given Ativan per [facility] as well as multivitamin,  thiamine and folic acid for alcohol detox. He is currently receiving prn Ativan every few hours. He has tremors but no DTs. It was recommended that the patient go to residential substance abuse treatment at [facility]. He has failed residential substance abuse treatment in the past but given liver disease, residential treatment is recommended. He was advised to abstain from alcohol as it may worsen mood symptoms. The patient was also given referral information for tried psychiatric and counseling in [place] as well as [facility] in [place] who does substance abuse counseling.  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 The patient was educated about the fact that alcohol will continue to worsen liver disease. 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 Ultrasound of the abdomen showing no visible stones in the ducts. Likely fatty infiltration of liver or intrinsic liver disease. - MRI of the abdomen shows Hepatomegaly with hepatic steatosis. Appreciate GI consult input, continue steroids orally.  - likely will need outpt Hepatology eval - LFTs improving slowly - trending down (T bili 23.9--> 20.6, AST 435-->242).   Neg hepatitis panel.  Disposition: The patient should remain in the hospital until fully detoxed. Would recommend that he go to [facility] for residential substance abuse treatment at the time of discharge. Referral information for [facility] and [facility] will be given to the patient as well as his daughters.    Daily contact with patient to assess and evaluate symptoms and progress in treatment and Medication management    Jay Schlichter, MD 10/08/2015 5:09 PM

## 2015-10-08 NOTE — [facility] Suicide Risk Assessment (Signed)
[facility] Admission Suicide Risk Assessment   Nursing information obtained from:   Chart and hospitalist Demographic factors:   60 y/o married male with 2 daughters Current Mental Status:   Alert and oriented x 3 Loss Factors:   Loss of business Historical Factors:    Risk Reduction Factors:     Total Time spent with patient: 1 hour Principal Problem: Alcohol Use Disorder  Diagnosis:   Patient Active Problem List   Diagnosis Date Noted  . Jaundice [R17] 10/05/2015  . Acute on chronic respiratory failure (HCC) [J96.20] 06/27/2015  . Macrocytosis without anemia [D75.89] 04/11/2015  . Alcoholism in recovery (HCC) [F10.20] 04/04/2015  . Alcohol withdrawal (HCC) [F10.239] 03/11/2015  . COPD exacerbation (HCC) [J44.1] 10/19/2014  . Tobacco abuse [Z72.0] 10/19/2014  . Generalized anxiety disorder [F41.1] 10/17/2014  . COPD with acute exacerbation (HCC) [J44.1] 10/15/2014  . Allergy [T78.40XA]   . Hypertension [I10]   . COPD (chronic obstructive pulmonary disease) (HCC) [J44.9]   . Adjustment disorder with depressed mood [F43.21]   . ED (erectile dysfunction) [N52.9]   . Tobacco use [Z72.0]   . Hypertriglyceridemia [E78.1]   . Elevated glucose [R73.09]   . Elevated serum glutamic pyruvic transaminase (SGPT) level [R74.0]    Subjective Data:     Devin Bryant is 60 year old married Caucasian male with a long history of alcohol dependence who was admitted to the medicine service secondary to jaundice and alcoholic liver disease. The patient reports that he was drinking heavily for the past 5-6 years, approximately 5-6 glasses of wine and vodka per day. Drinking has escalated over the past 6 months. He says when he gets home from work he drinks every evening in order to decrease his anxiety and to sleep. The patient says he does struggle with a lot of anxiety and has had multiple stressors contributing to this facility drinking.Marland Kitchen He has limitations secondary to COPD and is on oxygen at home as  needed. 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 The patient says that alcohol has progressively worsened over the past 5 years. Prior to 5 years ago, he considered himself "a functional alcoholic". He does report problems with tremors when he is not drinking and does have a history of alcohol-related blackouts. He denies any history of any DUIs. No history of any delirium tremens, just tremors. The patient has been on Paxil for anxiety for the past 15 years as well as Klonopin and Ambien. He says he has a difficult time sleeping without the Ambien but his PCP recently cut his Ambien off. The patient denies any current severe depressive symptoms but has struggled with some mild depression in the past. He does have a strong history of alcoholism in depression in his family. The patient says that 3 of his eighth sibling committed suicide. He denies any history of any symptoms consistent with bipolar mania including grandiose delusions, decreased sleep with increased goal-directed behavior, hyperreligious thoughts or hypersexual behavior. He denies any history of any psychosis including auditory or visual hallucinations. No paranoid thoughts or delusions. The patient says he did go to a residential substance abuse treatment center in [place] in January of this year. He did stay for the full 28 days but relapsed about 14 days after release. He has never seen a psychiatrist in the community and has been getting Paxil, Ambien and Klonopin from his PCP. While he does admit to heavy alcohol use, he  denies any illicit drug use including cocaine, marijuana, opiates, or stimulant use. He lives with his wife in the area and works part-time in Editor, commissioning. He denies any marital conflict. Both of his daughters were at the bedside and are very supportive of him getting substance abuse treatment.  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 LFTs are slowly trending down. He is currently being followed by GI but will most likely need a hepatology evaluation   Past psychiatric history The patient denies any prior inpatient psychiatric hospitalizations or suicide attempts but has been to residential substance abuse treatment in [place] in January 2017 for 28 days. He has been on Paxil, Klonopin and Ambien for over 10 years.  Substance abuse history: As stated in history of present illness, the patient does have a history of alcohol dependence. He is currently drinking 5-6 drinks wine and vodka on a daily basis for the past 5-6 years and prior to that was also drinking daily but not as heavily. He denies any history of any cocaine, opiate, stimulant, or marijuana use.  Family psychiatric history: The patient reports that 3 of his 8 siblings committed suicide. He says 3 of his brothers and one sister also struggle with alcohol dependence. His father was also an alcoholic. He does smoke one pack of cigarettes per day for over 20 years.  Legal history: The patient denies any history of any prior DUIs. Pending charges.    Continued Clinical Symptoms:    The "Alcohol Use Disorders Identification Test", Guidelines for Use in Primary Care, Second Edition.  World Science writer [facility]). Score between 0-7:  no or low risk or alcohol related problems. Score between 8-15:  moderate risk of alcohol related problems. Score between 16-19:  high risk of alcohol related problems. Score 20 or above:  warrants further diagnostic evaluation for alcohol dependence and treatment.   CLINICAL FACTORS:   Severe Anxiety and/or Agitation Depression:   Insomnia Alcohol/Substance Abuse/Dependencies   Musculoskeletal: Strength & Muscle Tone: within normal limits Gait & Station: normal Patient leans: N/A  Psychiatric Specialty Exam: Physical Exam  Review of Systems  Constitutional: Negative.   HENT:  Negative.   Eyes: Negative.   Respiratory: Negative.   Cardiovascular: Negative.   Gastrointestinal: Positive for abdominal pain and nausea.  Genitourinary: Negative.  Negative for dysuria, frequency and urgency.  Musculoskeletal: Positive for joint pain. Negative for back pain, myalgias and neck pain.  Skin: Negative.   Neurological: Positive for tremors. Negative for sensory change, speech change and focal weakness.  Endo/Heme/Allergies: Bruises/bleeds easily.    Blood pressure (!) 146/94, pulse (!) 109, temperature 98.5 F (36.9 C), temperature source Oral, resp. rate 20, height 5\' 10"  (1.778 m), weight 82.3 kg (181 lb 8 oz), SpO2 97 %.Body mass index is 26.04 kg/m.  General Appearance: Casual  Eye Contact:  Good  Speech:  Clear and Coherent and Normal Rate  Volume:  Normal  Mood:  Depressed  Affect:  Depressed  Thought Process:  Coherent and Goal Directed  Orientation:  Full (Time, Place, and Person)  Thought Content:  Logical  Suicidal Thoughts:  No  Homicidal Thoughts:  No  Memory:  Immediate;   Good Recent;   Good Remote;   Good  Judgement:  Other:  Poor by history  Insight:  Good  Psychomotor Activity:  Decreased  Concentration:  Concentration: Good and Attention Span: Good  Recall:  Good  Fund of Knowledge:  Good  Language:  Good  Akathisia:  No  Handed:  Right  AIMS (if indicated):     Assets:  Administrator, arts Vocational/Educational  ADL's:  Intact  Cognition:  WNL  Sleep:         COGNITIVE FEATURES THAT CONTRIBUTE TO RISK:  Closed-mindedness    SUICIDE RISK:   Minimal: No identifiable suicidal ideation.  Patients presenting with no risk factors but with morbid ruminations; may be classified as minimal risk based on the severity of the depressive symptoms. He is more at risk for accidental death due to alcohjol   PLAN OF CARE:   DIAGNOSIS: Generalized anxiety  disorder Alcohol use disorder, severe Cirrhosis of the liver Mild: Recent loss of the business, financial concerns  Devin Bryant is a 60 year old married Caucasian history of alcohol dependence was admitted to the mental secondary to jaundice and alcoholic liver disease. Drinking has escalated over the past 6 months he is currently experiencing withdrawal symptoms.  Generalized anxiety disorder: The patient will be titrated down off of Paxil by decreasing dose of 10 mg by mouth daily for the next 7 days while starting Remeron 15 mg by mouth nightly for his depression, anxiety and insomnia. Will discontinue Klonopin as the patient should not be on scheduled benzodiazepines secondary to alcohol dependence. He should be detoxed by Ativan considering elevated liver enzymes and liver disease. Avoid Librium. We'll discontinue the Ambien as well secondary to COPD and alcohol dependence. We'll discontinue Ambien as the patient should not be on a sedative hypnotic secondary to both alcohol dependence as well as COPD. In addition to change of psychotropic medications, the patient should also pursue some CBT and mindfulness therapy to help decrease anxiety. Referral information was given for [facility] in [place].  Alcohol use disorder, severe: The patient will be given Ativan per CIWA as well as multivitamin, thiamine and folic acid for alcohol detox. It was recommended that the patient go to residential substance abuse treatment at [facility]. He has failed residential substance abuse treatment in the past but given liver disease, residential treatment is recommended. He was advised to abstain from alcohol as it may worsen mood symptoms. The patient was also given referral information for [facility] and counseling in [place] as well as [facility] in [place] who does substance abuse counseling.  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 The  patient was educated about the fact that alcohol will continue to worsen liver disease. 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 Ultrasound of the abdomen showing no visible stones in the ducts. Likely fatty infiltration of liver or intrinsic liver disease. - MRI of the abdomen shows Hepatomegaly with hepatic steatosis. Appreciate GI consult input, continue steroids orally.  - likely will need outpt Hepatology eval - LFTs improving slowly - trending down (T bili 23.9--> 20.6, AST 435-->242).   Neg hepatitis panel.  Disposition: The patient should remain in the hospital until fully detoxed. Would recommend that he go to [facility] for residential substance abuse treatment at the time of discharge. Referral information for [facility] and [facility] will be given to the patient as well as his daughters.    I certify that inpatient services furnished can reasonably be expected to improve the patient's condition.   Levora Angel, MD 10/08/2015, 5:44 PM

## 2015-10-08 NOTE — Progress Notes (Signed)
Pharmacy Consult for electrolyte supplementation Indication: hypokalemia  No Known Allergies  Patient Measurements: Height: 5\' 10"  (177.8 cm) Weight: 181 lb 8 oz (82.3 kg) IBW/kg (Calculated) : 73  Vital Signs: Temp: 98.5 F (36.9 C) (08/05 1359) Temp Source: Oral (08/05 1359) BP: 146/94 (08/05 1359) Pulse Rate: 109 (08/05 1359) Intake/Output from previous day: 08/04 0701 - 08/05 0700 In: 635.8 [I.V.:635.8] Out: 400 [Urine:400] Intake/Output from this shift: Total I/O In: 500 [I.V.:500] Out: -   Labs:  Recent Labs  10/06/15 0453 10/07/15 0448 10/08/15 0418  WBC 10.8* 12.8* 11.7*  HGB 11.0* 11.0* 11.5*  HCT 30.8* 31.4* 33.5*  PLT 145* 148* 154     Recent Labs  10/06/15 0453 10/06/15 1824 10/07/15 0448 10/07/15 0728 10/08/15 0418 10/08/15 1309  NA 135  --  132* 132* 134* 135  K 3.5 3.2* 3.0* 3.1* 3.0* 3.5  CL 98*  --  101 102 102 106  CO2 30  --  24 23 24 23   GLUCOSE 93  --  80 78 111* 112*  BUN 6  --  <5* <5* <5* <5*  CREATININE <0.30*  --  <0.30* <0.30* <0.30* <0.30*  CALCIUM 8.2*  --  7.7* 7.8* 7.9* 7.9*  MG  --  1.7  --   --   --   --   PHOS  --   UNABLE TO REPORT DUE TO ICTERUS  --   --   --   --   PROT 6.0*  --  5.8*  --  6.1*  --   ALBUMIN 2.2*  --  2.1*  --  2.2*  --   AST 321*  --  242*  --  212*  --   ALT 178*  --  141*  --  132*  --   ALKPHOS 292*  --  265*  --  301*  --   BILITOT 21.8*  --  20.6*  --  22.7*  --    CrCl cannot be calculated (This lab value cannot be used to calculate CrCl because it is not a number: <0.30).   No results for input(s): GLUCAP in the last 72 hours.  Assessment: Pharmacy consulted to monitor/replace electrolytes in this 60 year old male with hypokalemia admitted for alcoholic hepatitis  Plan:  8/5 at 13:09 K = 3.5. No supplementation needed at this time.  Will recheck level with am labs.    Stormy Card, [facility] Clinical Pharmacist 10/08/2015

## 2015-10-09 LAB — ECHOCARDIOGRAM COMPLETE
AV Area VTI: 2.84 cm2
AV Peak grad: 5 mmHg
AVPKVEL: 109 cm/s
Ao pk vel: 0.75 m/s
CHL CUP AV PEAK INDEX: 1.42
EERAT: 3.47
EWDT: 141 ms
FS: 31 % (ref 28–44)
Height: 70 in
IV/PV OW: 1.17
LA ID, A-P, ES: 22 mm
LA diam index: 1.1 cm/m2
LA vol: 41.4 mL
LAVOLA4C: 33.6 mL
LAVOLIN: 20.7 mL/m2
LDCA: 3.8 cm2
LEFT ATRIUM END SYS DIAM: 22 mm
LV PW d: 8.81 mm — AB (ref 0.6–1.1)
LV TDI E'MEDIAL: 11.1
LVEEAVG: 3.47
LVEEMED: 3.47
LVELAT: 8.38 cm/s
LVOT diameter: 22 mm
LVOTPV: 81.5 cm/s
MV Dec: 141
MV pk A vel: 67.9 m/s
MV pk E vel: 29.1 m/s
TDI e' lateral: 8.38
Weight: 2904 oz

## 2015-10-09 LAB — HEPATIC FUNCTION PANEL
ALT: 117 U/L — AB (ref 17–63)
AST: 192 U/L — AB (ref 15–41)
Albumin: 2.1 g/dL — ABNORMAL LOW (ref 3.5–5.0)
Alkaline Phosphatase: 276 U/L — ABNORMAL HIGH (ref 38–126)
BILIRUBIN DIRECT: 13.5 mg/dL — AB (ref 0.1–0.5)
Indirect Bilirubin: 8.3 mg/dL — ABNORMAL HIGH (ref 0.3–0.9)
Total Bilirubin: 21.8 mg/dL (ref 0.3–1.2)
Total Protein: 6.1 g/dL — ABNORMAL LOW (ref 6.5–8.1)

## 2015-10-09 LAB — MAGNESIUM: Magnesium: 1.7 mg/dL (ref 1.7–2.4)

## 2015-10-09 LAB — POTASSIUM
POTASSIUM: 2.8 mmol/L — AB (ref 3.5–5.1)
Potassium: 3.4 mmol/L — ABNORMAL LOW (ref 3.5–5.1)

## 2015-10-09 MED ORDER — POTASSIUM CHLORIDE CRYS ER 20 MEQ PO TBCR
40.0000 meq | EXTENDED_RELEASE_TABLET | ORAL | Status: AC
  Administered 2015-10-09 (×5): 40 meq via ORAL
  Filled 2015-10-09 (×5): qty 2

## 2015-10-09 MED ORDER — POTASSIUM CHLORIDE CRYS ER 20 MEQ PO TBCR
20.0000 meq | EXTENDED_RELEASE_TABLET | Freq: Once | ORAL | Status: AC
  Administered 2015-10-09: 19:00:00 20 meq via ORAL
  Filled 2015-10-09: qty 1

## 2015-10-09 MED ORDER — MAGNESIUM SULFATE 4 GM/100ML IV SOLN
4.0000 g | Freq: Once | INTRAVENOUS | Status: AC
  Administered 2015-10-09: 4 g via INTRAVENOUS
  Filled 2015-10-09: qty 100

## 2015-10-09 NOTE — Plan of Care (Signed)
Problem: Bowel/Gastric: Goal: Will not experience complications related to bowel motility Outcome: Progressing Pt is alert and oriented x 4, denies pain, ambulatory in room, loose bm during shift, wife visiting at bedside, fair appetite, ciwa scale evaluated q6h, no prn ativan needed during shift, po potassium iv IV magnesium replacement given, bilirubin remains elevated- MD aware. Sleeping in between care, uneventful shift.

## 2015-10-09 NOTE — Progress Notes (Signed)
[employer] - [place] at [facility]   PATIENT NAME: Devin Bryant    MR#:  [id number]  DATE OF BIRTH:  [birth year]  SUBJECTIVE:  CHIEF COMPLAINT:   Chief Complaint  Patient presents with  . abdominal distention  . Anorexia  Much more alert, feels comfortable, denies any pain, he is not sure about residential substance abuse treatment facility placement yet. LFTs are improving. Remains jaundice REVIEW OF SYSTEMS:  Review of Systems  Unable to perform ROS: Mental status change    DRUG ALLERGIES:  No Known Allergies VITALS:  Blood pressure 132/74, pulse (!) 118, temperature 98.7 F (37.1 C), temperature source Oral, resp. rate (!) 21, height  (1.778 m), weight 82.3 kg (181 lb 8 oz), SpO2 95 %. PHYSICAL EXAMINATION:  Physical Exam  Constitutional: He is well-developed, well-nourished, and in no distress.  HENT:  Head: Normocephalic and atraumatic.  Eyes: Conjunctivae and EOM are normal. Pupils are equal, round, and reactive to light.  Neck: Normal range of motion. Neck supple. No JVD present. No tracheal deviation present. No thyromegaly present.  Cardiovascular: Normal rate, regular rhythm and normal heart sounds.   No murmur heard. Pulmonary/Chest: Effort normal and breath sounds normal. No stridor. No respiratory distress. He has no wheezes. He exhibits no tenderness.  Abdominal: Soft. Bowel sounds are normal. He exhibits no distension and no pulsatile liver. There is hepatomegaly. There is no tenderness.  Musculoskeletal: Normal range of motion.  Neurological: He is alert. No cranial nerve deficit.  Skin: Skin is warm, dry and intact. No rash noted.  Jaundiced throughout. Patient also has psoriasis face and arms  Psychiatric: Mood and affect normal.  Nursing note and vitals reviewed. Much more alert, comfortable, discussing his issues. Has hand flap on evaluation for symptoms of hepatic encephalopathy LABORATORY PANEL:   CBC  Recent Labs Lab  10/08/15 0418  WBC 11.7*  HGB 11.5*  HCT 33.5*  PLT 154   ------------------------------------------------------------------------------------------------------------------ Chemistries   Recent Labs Lab 10/08/15 1309 10/09/15 0614  NA 135  --   K 3.5 2.8*  CL 106  --   CO2 23  --   GLUCOSE 112*  --   BUN <5*  --   CREATININE <0.30*  --   CALCIUM 7.9*  --   MG  --  1.7  AST  --  192*  ALT  --  117*  ALKPHOS  --  276*  BILITOT  --  21.8*   RADIOLOGY:  No results found. ASSESSMENT AND PLAN:  60 year old male with a known history of anxiety, COPD, GERD is admitted for  1. 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 Ultrasound of the abdomen showing no visible stones in the ducts. Likely fatty infiltration of liver or intrinsic liver disease. - MRI of the abdomen shows Hepatomegaly with hepatic steatosis. Appreciate GI consult input, continue steroids orally.  - likely will need outpt Hepatology eval - LFTs improving slowly - trending down (T bili 23.9--> 21.8 AST 435--> 192).   Neg hepatitis panel. -  Patient advised that he can no longer drink. Ciwa protocol to be continued. - Continue aldactone 25 mg qd for ascites/LE edema.  - also Prednisone  qd for alcoholic hepatitis per GI.   2. Hepatic encephalopathy with elevated ammonia level, hand flap, continue patient on Xifaxan, improved mentally clinically. Holding lactulose due to diarrhea and hypokalemia  3. Hypokalemia - Repleting orally and recheck later today - mg was 1.7, replenishing orally  and intravenously  4. Hyponatremia: - Resolved with IV hydration initially, was felt to be dehydration related, follow in the morning  5. COPD. Continue inhalers and nebulizer treatments.  6. Psoriasis: Continue Otezla at home.  7. Anxiety/depression: The patient was seen by psychiatrist, who is weaning patient off Paxil, initiating on the Remeron. Once patient is off  Paxil, according to psychiatrist, he should be considered for Celexa or Lexapro, if the Remeron alone does not help. He also should be off Klonopin, as according to Dr. Maryruth BunKapur, patient should not be on scheduled benzodiazepines secondary to alcohol dependence, he should be detoxed by Ativan, avoiding Librium. Ambien is also discontinued due to COPD and alcohol dependence. Dr. Maryruth BunKapur also recommended cognitive behavioral therapy and mindfulness therapy to decrease anxiety, she [facility] [place]. Patient is not sure about residential treatment facility placement yet. I discussed with him today.   8. Thrush: nystatin swish and swallow, Magic mouthwash  9. Tobacco abuse smoking cessation counseling done 3 minutes by Dr Renae GlossWieting and nicotine patch applied  10. Lactic acidosis: resolved  11. Elevated troponin likely demand ischemia , get echo  12. Alcohol withdrawal: Continue CIWA, reported as 0 over the past 12 hours, following clinically    All the records are reviewed and case discussed with Care Management/Social Worker. Management plans discussed with the patient, family and they are in agreement.  CODE STATUS: Full code  TOTAL TIME TAKING CARE OF THIS PATIENT: 30 minutes.      More than 50% of the time was spent in counseling/coordination of care: YES  POSSIBLE D/C IN 1-2 DAYS, DEPENDING ON CLINICAL CONDITION.   Katharina CaperVAICKUTE,Beatriz Quintela M.D on 10/09/2015 at 11:03 AM  Between 7am to 6pm - Pager - [phone]  After 6pm go to [email or web address] - Social research officer, governmentpassword EPAS [facility]  [facility]  Office  [phone]  CC: Primary care physician; Baruch GoutyMelinda Lada, MD  Note: This dictation was prepared with Dragon dictation along with smaller phrase technology. Any transcriptional errors that result from this process are unintentional.

## 2015-10-09 NOTE — Consult Note (Signed)
Patient feeling better today.  Saw Dr. Maryruth BunKapur psychiatrist.  I told him he will die much sooner unless he can control his alcoholim.  TB 21.8  AST 192 ALT 117 WBC 11.7 plt 154 P 121  BP 150/85.  No new suggestions.  On prednisone.

## 2015-10-09 NOTE — Progress Notes (Signed)
Notified Dr. Winona LegatoVaickute in person of critical bilirubin and potassium of 2.8, MD to enter orders.

## 2015-10-09 NOTE — Progress Notes (Addendum)
Pharmacy Consult for electrolyte supplementation Indication: hypokalemia  No Known Allergies  Patient Measurements: Height: 5\' 10"  (177.8 cm) Weight: 181 lb 8 oz (82.3 kg) IBW/kg (Calculated) : 73  Vital Signs: Temp: 98.7 F (37.1 C) (08/06 0459) Temp Source: Oral (08/06 0459) BP: 132/74 (08/06 0459) Pulse Rate: 118 (08/06 0459) Intake/Output from previous day: 08/05 0701 - 08/06 0700 In: 500 [I.V.:500] Out: -  Intake/Output from this shift: No intake/output data recorded.  Labs:  Recent Labs  10/07/15 0448 10/08/15 0418  WBC 12.8* 11.7*  HGB 11.0* 11.5*  HCT 31.4* 33.5*  PLT 148* 154     Recent Labs  10/06/15 1824  10/07/15 0448 10/07/15 0728 10/08/15 0418 10/08/15 1309 10/09/15 0614  NA  --   < > 132* 132* 134* 135  --   K 3.2*  --  3.0* 3.1* 3.0* 3.5 2.8*  CL  --   < > 101 102 102 106  --   CO2  --   < > 24 23 24 23   --   GLUCOSE  --   < > 80 78 111* 112*  --   BUN  --   < > <5* <5* <5* <5*  --   CREATININE  --   < > <0.30* <0.30* <0.30* <0.30*  --   CALCIUM  --   < > 7.7* 7.8* 7.9* 7.9*  --   MG 1.7  --   --   --   --   --   --   PHOS  UNABLE TO REPORT DUE TO ICTERUS  --   --   --   --   --   --   PROT  --   --  5.8*  --  6.1*  --  6.1*  ALBUMIN  --   --  2.1*  --  2.2*  --  2.1*  AST  --   --  242*  --  212*  --  192*  ALT  --   --  141*  --  132*  --  117*  ALKPHOS  --   --  265*  --  301*  --  276*  BILITOT  --   --  20.6*  --  22.7*  --  21.8*  BILIDIR  --   --   --   --   --   --  13.5*  IBILI  --   --   --   --   --   --  8.3*  < > = values in this interval not displayed. CrCl cannot be calculated (This lab value cannot be used to calculate CrCl because it is not a number: <0.30).   No results for input(s): GLUCAP in the last 72 hours.  Assessment: Pharmacy consulted to monitor/replace electrolytes in this 60 year old male with hypokalemia admitted for alcoholic hepatitis  Plan:  8/6 ~ 06:15  K = 2.8, Mag = 1.7.  MD entered orders for  KCL 40meq PO Q4H x 5 doses and Mag 4g IV x 1. A potassium level has been ordered for 8/6 at 15:00.  Will also recheck levels with am labs.    Stormy CardKatsoudas,Clovis Warwick K, [facility] Hospital[facility]PH Clinical Pharmacist 10/09/2015, 8:02 AM

## 2015-10-09 NOTE — Progress Notes (Signed)
Pharmacy Consult for electrolyte supplementation Indication: hypokalemia  No Known Allergies  Patient Measurements: Height:  (177.8 cm) Weight: 181 lb 8 oz (82.3 kg) IBW/kg (Calculated) : 73  Vital Signs: Temp: 98.1 F (36.7 C) (08/06 1319) Temp Source: Oral (08/06 1319) BP: 151/85 (08/06 1319) Pulse Rate: 121 (08/06 1319) Intake/Output from previous day: 08/05 0701 - 08/06 0700 In: 500 [I.V.:500] Out: -  Intake/Output from this shift: Total I/O In: 460 [P.O.:360; IV Piggyback:100] Out: -   Labs:  Recent Labs  10/07/15 0448 10/08/15 0418  WBC 12.8* 11.7*  HGB 11.0* 11.5*  HCT 31.4* 33.5*  PLT 148* 154     Recent Labs  10/06/15 1824  10/07/15 0448 10/07/15 0728 10/08/15 0418 10/08/15 1309 10/09/15 0614 10/09/15 1442  NA  --   < > 132* 132* 134* 135  --   --   K 3.2*  --  3.0* 3.1* 3.0* 3.5 2.8* 3.4*  CL  --   < > 101 102 102 106  --   --   CO2  --   < > --   --   GLUCOSE  --   < > 80 78 111* 112*  --   --   BUN  --   < > <5* <5* <5* <5*  --   --   CREATININE  --   < > <0.30* <0.30* <0.30* <0.30*  --   --   CALCIUM  --   < > 7.7* 7.8* 7.9* 7.9*  --   --   MG 1.7  --   --   --   --   --  1.7  --   PHOS  UNABLE TO REPORT DUE TO ICTERUS  --   --   --   --   --   --   --   PROT  --   --  5.8*  --  6.1*  --  6.1*  --   ALBUMIN  --   --  2.1*  --  2.2*  --  2.1*  --   AST  --   --  242*  --  212*  --  192*  --   ALT  --   --  141*  --  132*  --  117*  --   ALKPHOS  --   --  265*  --  301*  --  276*  --   BILITOT  --   --  20.6*  --  22.7*  --  21.8*  --   BILIDIR  --   --   --   --   --   --  13.5*  --   IBILI  --   --   --   --   --   --  8.3*  --   < > = values in this interval not displayed. CrCl cannot be calculated (This lab value cannot be used to calculate CrCl because it is not a number: <0.30).   No results for input(s): GLUCAP in the last 72 hours.  Assessment: Pharmacy consulted to monitor/replace electrolytes in this 60  year old male with hypokalemia admitted for alcoholic hepatitis  Plan:  8/6 ~ 06:15  K = 2.8, Mag = 1.7.  MD entered orders for KCL PO Q4H x 5 doses and Mag 4g IV x 1. A potassium level has been ordered for 8/6 at 15:00.  8/6 at 14:42  K = 3.4.  Ordered KCL 20meq PO once.   Will recheck levels with am labs.    Stormy CardKatsoudas,Nivin Braniff K, [facility] LPRPH Clinical Pharmacist 10/09/2015, 3:15 PM

## 2015-10-09 NOTE — Progress Notes (Signed)
[facility] MD Progress Note  10/09/2015 6:29 PM Devin Bryant  MRN:  [id number]   Subjective:   The patient reports that he was able to sleep well with the Remeron last night. He is feeling better today especially since his stools have been more formed. The patient denies any current active or passive suicidal thoughts or psychotic symptoms. He is still having some mild tremors and detox is progressing well. He does still need Ativan for detox. The patient is no longer on Klonopin and is aware that he cannot be on benzodiazepines secondary to both alcohol dependence as well as problems with cognition related to liver disease and increased fall risk. The patient verbalized understanding. He does appear to be minimizing depressive symptoms. The patient remains in a lot of denial about the degree of severity of his liver disease associated with alcohol use and is hesitant to go to [facility] for residential substance abuse treatment. His wife was contacted and she and his daughters have tried very hard to get him to get treatment without success.  Past psychiatric history The patient denies any prior inpatient psychiatric hospitalizations or suicide attempts but has been to residential substance abuse treatment in [place] in January 2017 for 28 days. He has been on Paxil, Klonopin and Ambien for over 10 years.  Substance abuse history: As stated in history of present illness, the patient does have a history of alcohol dependence. He is currently drinking 5-6 drinks wine and vodka on a daily basis for the past 5-6 years and prior to that was also drinking daily but not as heavily. He denies any history of any cocaine, opiate, stimulant, or marijuana use.  Family psychiatric history: The patient reports that 3 of his 8 siblings committed suicide. He says 3 of his brothers and one sister also struggle with alcohol dependence. His father was also an alcoholic. He does smoke one pack of cigarettes per day for  over 20 years.  Legal History The patient denies any history of any prior DUIs. Pending charges.    Principal Problem: Alcohol Dependence  Diagnosis:   Patient Active Problem List   Diagnosis Date Noted  . Alcohol dependence with withdrawal with complication ([place]) [J00.938]   . Jaundice [R17] 10/05/2015  . Acute on chronic respiratory failure ([place]) [J96.20] 06/27/2015  . Macrocytosis without anemia [D75.89] 04/11/2015  . Alcoholism in recovery ([place]) [F10.20] 04/04/2015  . Alcohol withdrawal ([place]) [F10.239] 03/11/2015  . COPD exacerbation ([place]) [J44.1] 10/19/2014  . Tobacco abuse [Z72.0] 10/19/2014  . Generalized anxiety disorder [F41.1] 10/17/2014  . COPD with acute exacerbation ([place]) [J44.1] 10/15/2014  . Allergy [T78.40XA]   . Hypertension [I10]   . COPD (chronic obstructive pulmonary disease) ([place]) [J44.9]   . Adjustment disorder with depressed mood [F43.21]   . ED (erectile dysfunction) [N52.9]   . Tobacco use [Z72.0]   . Hypertriglyceridemia [E78.1]   . Elevated glucose [R73.09]   . Elevated serum glutamic pyruvic transaminase (SGPT) level [R74.0]    Total Time spent with patient: 20 minutes   Past Medical History:  Past Medical History:  Diagnosis Date  . Adjustment disorder with depressed mood   . Allergy   . Anxiety   . COPD (chronic obstructive pulmonary disease) ([place])   . ED (erectile dysfunction)   . Elevated glucose   . Elevated serum glutamic pyruvic transaminase (SGPT) level   . Emphysema lung ([place])   . GERD (gastroesophageal reflux disease)   . Hypertension   . Hypertriglyceridemia   .  Tobacco use     Past Surgical History:  Procedure Laterality Date  . KNEE SURGERY     arthroscopic  . SEPTOPLASTY  Feb 2016  . TONSILLECTOMY     Family History:  Family History  Problem Relation Age of Onset  . Diabetes Mother   . CAD Mother   . Cancer Father     lung  . Heart disease Father   . Heart attack Father   . COPD Sister   . Heart disease  Brother   . Heart attack Brother   . COPD Brother   . Hypertension Neg Hx   . Stroke Neg Hx     Social History:  History  Alcohol Use  . Yes    Comment: daughter states patient has been drinking heavily for the past three years with increased drinking over the past 6 months.     History  Drug Use  . Types: Marijuana    Social History   Social History  . Marital status: Married    Spouse name: N/A  . Number of children: N/A  . Years of education: N/A   Social History Main Topics  . Smoking status: Current Every Day Smoker    Packs/day: 0.50    Years: 40.00    Types: Cigarettes  . Smokeless tobacco: Never Used  . Alcohol use Yes     Comment: daughter states patient has been drinking heavily for the past three years with increased drinking over the past 6 months.  . Drug use:     Types: Marijuana  . Sexual activity: Not Asked   Other Topics Concern  . None   Social History Narrative  . None      Sleep: Improved  Appetite:  Fair  Current Medications: Current Facility-Administered Medications  Medication Dose Route Frequency Provider Last Rate Last Dose  . albuterol (PROVENTIL) (2.5 MG/3ML) 0.083% nebulizer solution 2.5 mg  2.5 mg Nebulization Q4H PRN Loletha Grayer, MD      . enoxaparin (LOVENOX) injection 40 mg  40 mg Subcutaneous Q24H Max Sane, MD   40 mg at 10/08/15 2207  . fluticasone (FLONASE) 50 MCG/ACT nasal spray 1 spray  1 spray Each Nare Daily Loletha Grayer, MD   1 spray at 10/09/15 [phone]  . folic acid (FOLVITE) tablet 1 mg  1 mg Oral Daily Nena Polio, MD   1 mg at 10/09/15 0844  . gabapentin (NEURONTIN) capsule 300 mg  300 mg Oral BID Max Sane, MD   300 mg at 10/09/15 0843  . loratadine (CLARITIN) tablet 10 mg  10 mg Oral Daily Loletha Grayer, MD   10 mg at 10/09/15 1000  . LORazepam (ATIVAN) injection 0.5 mg  0.5 mg Intravenous Once Loletha Grayer, MD      . LORazepam (ATIVAN) tablet 1 mg  1 mg Oral Q4H PRN Loletha Grayer, MD   1 mg at  10/08/15 2041   Or  . LORazepam (ATIVAN) injection 1 mg  1 mg Intravenous Q6H PRN Loletha Grayer, MD   1 mg at 10/08/15 0122  . mirtazapine (REMERON) tablet 15 mg  15 mg Oral QHS Chauncey Mann, MD   15 mg at 10/08/15 2206  . mometasone-formoterol (DULERA) 200-5 MCG/ACT inhaler 2 puff  2 puff Inhalation BID Loletha Grayer, MD   2 puff at 10/09/15 0846  . multivitamin with minerals tablet 1 tablet  1 tablet Oral Daily Nena Polio, MD   1 tablet at 10/09/15 [phone]  . nicotine (  NICODERM CQ - dosed in mg/24 hours) patch 14 mg  14 mg Transdermal Daily Loletha Grayer, MD   14 mg at 10/08/15 1818  . nystatin (MYCOSTATIN) 100000 UNIT/ML suspension 500,000 Units  5 mL Oral QID Loletha Grayer, MD   500,000 Units at 10/09/15 1326  . pantoprazole (PROTONIX) EC tablet 40 mg  40 mg Oral Daily Loletha Grayer, MD   40 mg at 10/09/15 0844  . PARoxetine (PAXIL) tablet 10 mg  10 mg Oral Daily Chauncey Mann, MD   10 mg at 10/09/15 0844  . potassium chloride SA (K-DUR,KLOR-CON) CR tablet 20 mEq  20 mEq Oral Once Max Sane, MD      . potassium chloride SA (K-DUR,KLOR-CON) CR tablet 40 mEq  40 mEq Oral Q4H Theodoro Grist, MD   40 mEq at 10/09/15 1326  . predniSONE (DELTASONE) tablet 30 mg  30 mg Oral Q breakfast Ronney Asters, PA-C   30 mg at 10/09/15 0844  . rifaximin (XIFAXAN) tablet 550 mg  550 mg Oral BID Theodoro Grist, MD   550 mg at 10/09/15 0844  . sodium chloride flush (NS) 0.9 % injection 3 mL  3 mL Intravenous Q12H Loletha Grayer, MD   3 mL at 10/09/15 0848  . spironolactone (ALDACTONE) tablet 25 mg  25 mg Oral Daily Ronney Asters, PA-C   25 mg at 10/09/15 0844  . theophylline (THEODUR) 12 hr tablet 300 mg  300 mg Oral BID Loletha Grayer, MD   300 mg at 10/09/15 0844  . thiamine (VITAMIN B-1) tablet 100 mg  100 mg Oral Daily Nena Polio, MD   100 mg at 10/09/15 0844   Or  . thiamine (B-1) injection 100 mg  100 mg Intravenous Daily Nena Polio, MD   100 mg at 10/07/15 1035  . tiotropium  (SPIRIVA) inhalation capsule 18 mcg  18 mcg Inhalation Daily Loletha Grayer, MD   18 mcg at 10/09/15 0846  . triamcinolone ointment (KENALOG) 0.1 %   Topical Daily PRN Loletha Grayer, MD        Lab Results:  Results for orders placed or performed during the hospital encounter of 10/05/15 (from the past 48 hour(s))  CBC     Status: Abnormal   Collection Time: 10/08/15  4:18 AM  Result Value Ref Range   WBC 11.7 (H) 3.8 - 10.6 K/uL   RBC 2.71 (L) 4.40 - 5.90 MIL/uL   Hemoglobin 11.5 (L) 13.0 - 18.0 g/dL   HCT 33.5 (L) 40.0 - 52.0 %   MCV 123.8 (H) 80.0 - 100.0 fL   MCH 42.6 (H) 26.0 - 34.0 pg   MCHC 34.4 32.0 - 36.0 g/dL   RDW 18.9 (H) 11.5 - 14.5 %   Platelets 154 150 - 440 K/uL  Comprehensive metabolic panel     Status: Abnormal   Collection Time: 10/08/15  4:18 AM  Result Value Ref Range   Sodium 134 (L) 135 - 145 mmol/L   Potassium 3.0 (L) 3.5 - 5.1 mmol/L   Chloride 102 101 - 111 mmol/L   CO2 24 22 - 32 mmol/L   Glucose, Bld 111 (H) 65 - 99 mg/dL   BUN <5 (L) 6 - 20 mg/dL   Creatinine, Ser <0.30 (L) 0.61 - 1.24 mg/dL    Comment: ICTERUS AT THIS LEVEL MAY AFFECT RESULT   Calcium 7.9 (L) 8.9 - 10.3 mg/dL   Total Protein 6.1 (L) 6.5 - 8.1 g/dL   Albumin 2.2 (L) 3.5 - 5.0 g/dL  AST 212 (H) 15 - 41 U/L   ALT 132 (H) 17 - 63 U/L    Comment: ICTERUS AT THIS LEVEL MAY AFFECT RESULT   Alkaline Phosphatase 301 (H) 38 - 126 U/L   Total Bilirubin 22.7 (HH) 0.3 - 1.2 mg/dL    Comment: CRITICAL VALUE NOTED. VALUE IS CONSISTENT WITH PREVIOUSLY REPORTED/CALLED VALUE   GFR calc non Af Amer NOT CALCULATED >60 mL/min   GFR calc Af Amer NOT CALCULATED >60 mL/min    Comment: (NOTE) The eGFR has been calculated using the CKD EPI equation. This calculation has not been validated in all clinical situations. eGFR's persistently <60 mL/min signify possible Chronic Kidney Disease.    Anion gap 8 5 - 15  Ammonia     Status: Abnormal   Collection Time: 10/08/15  8:14 AM  Result Value Ref  Range   Ammonia 66 (H) 9 - 35 umol/L    Comment: ICTERUS AT THIS LEVEL MAY AFFECT RESULT  Basic metabolic panel     Status: Abnormal   Collection Time: 10/08/15  1:09 PM  Result Value Ref Range   Sodium 135 135 - 145 mmol/L   Potassium 3.5 3.5 - 5.1 mmol/L   Chloride 106 101 - 111 mmol/L   CO2 23 22 - 32 mmol/L   Glucose, Bld 112 (H) 65 - 99 mg/dL   BUN <5 (L) 6 - 20 mg/dL   Creatinine, Ser <0.30 (L) 0.61 - 1.24 mg/dL    Comment: ICTERUS AT THIS LEVEL MAY AFFECT RESULT   Calcium 7.9 (L) 8.9 - 10.3 mg/dL   GFR calc non Af Amer NOT CALCULATED >60 mL/min   GFR calc Af Amer NOT CALCULATED >60 mL/min    Comment: (NOTE) The eGFR has been calculated using the CKD EPI equation. This calculation has not been validated in all clinical situations. eGFR's persistently <60 mL/min signify possible Chronic Kidney Disease.    Anion gap 6 5 - 15  Potassium     Status: Abnormal   Collection Time: 10/09/15  6:14 AM  Result Value Ref Range   Potassium 2.8 (L) 3.5 - 5.1 mmol/L  Hepatic function panel     Status: Abnormal   Collection Time: 10/09/15  6:14 AM  Result Value Ref Range   Total Protein 6.1 (L) 6.5 - 8.1 g/dL   Albumin 2.1 (L) 3.5 - 5.0 g/dL   AST 192 (H) 15 - 41 U/L   ALT 117 (H) 17 - 63 U/L    Comment: ICTERUS AT THIS LEVEL MAY AFFECT RESULT   Alkaline Phosphatase 276 (H) 38 - 126 U/L   Total Bilirubin 21.8 (HH) 0.3 - 1.2 mg/dL    Comment: CRITICAL RESULT CALLED TO, READ BACK BY AND VERIFIED WITH: BRANDY DAVENPORT @ 0727 10/09/15 BY [facility]    Bilirubin, Direct 13.5 (H) 0.1 - 0.5 mg/dL   Indirect Bilirubin 8.3 (H) 0.3 - 0.9 mg/dL  Magnesium     Status: None   Collection Time: 10/09/15  6:14 AM  Result Value Ref Range   Magnesium 1.7 1.7 - 2.4 mg/dL  Potassium     Status: Abnormal   Collection Time: 10/09/15  2:42 PM  Result Value Ref Range   Potassium 3.4 (L) 3.5 - 5.1 mmol/L    Blood Alcohol level:  Lab Results  Component Value Date   ETH <5 10/05/2015   ETH <5 63/14/9702     Metabolic Disorder Labs: No results found for: HGBA1C, MPG No results found for: PROLACTIN No results  found for: CHOL, TRIG, HDL, CHOLHDL, VLDL, LDLCALC  Physical Findings: AIMS:  , ,  ,  ,    CIWA:  CIWA-Ar Total: 2 COWS:     Musculoskeletal: Strength & Muscle Tone: decreased Gait & Station: unsteady Patient leans: N/A  Psychiatric Specialty Exam: Physical Exam  Review of Systems  Constitutional: Positive for malaise/fatigue. Negative for chills, fever and weight loss.  HENT: Negative for hearing loss, sore throat and tinnitus.   Eyes: Negative for blurred vision, double vision and photophobia.  Respiratory: Positive for shortness of breath. Negative for cough, hemoptysis and wheezing.   Gastrointestinal: Positive for diarrhea and nausea. Negative for abdominal pain, blood in stool, heartburn and vomiting.  Genitourinary: Negative for dysuria and urgency.  Musculoskeletal: Negative for back pain, myalgias and neck pain.  Skin: Negative for itching and rash.  Neurological: Positive for tremors and weakness. Negative for dizziness, tingling, focal weakness, loss of consciousness and headaches.  Endo/Heme/Allergies: Bruises/bleeds easily.    Blood pressure (!) 151/85, pulse (!) 121, temperature 98.1 F (36.7 C), temperature source Oral, resp. rate 20, height 5' 10"  (1.778 m), weight 82.3 kg (181 lb 8 oz), SpO2 98 %.Body mass index is 26.04 kg/m.  General Appearance: Disheveled  Eye Contact:  Fair  Speech:  Clear and Coherent and Normal Rate  Volume:  Normal  Mood:  Depressed  Affect:  Depressed  Thought Process:  Coherent, Goal Directed and Linear  Orientation:  Full (Time, Place, and Person)  Thought Content:  Logical  Suicidal Thoughts:  No  Homicidal Thoughts:  No  Memory:  Immediate;   Good Recent;   Good Remote;   Good  Judgement:  Impaired  Insight:  Lacking  Psychomotor Activity:  Decreased  Concentration:  Concentration: Fair and Attention Span: Fair   Recall:  [employer] of Knowledge:  Good  Language:  Good  Akathisia:  No  Handed:  Right  AIMS (if indicated):     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 Drinking has escalated over the past 6 months he is currently experiencing withdrawal symptoms.  Generalized anxiety disorder, R/O Major Depression: The patient is being titrated off Paxil and will continue on Remeron as he slept well on the medication. Will continue Remeron 34m po nightly for now.  Discontinued Klonopin has been discontinued and he should not be on scheduled benzodiazepines secondary to alcohol dependence. He should be detoxed by Ativan considering elevated liver enzymes and liver disease. Avoid Librium. Discontinued on Ambien as well secondary to COPD and alcohol dependence. He should not be on a sedative hypnotic secondary to both alcohol dependence as well as COPD. In addition to change of psychotropic medications, the patient should also pursue some CBT and mindfulness therapy to help decrease anxiety. Referral information was given for [facility] [place]  Alcohol use disorder, severe: The patient will be given Ativan per CIWA as well as multivitamin, thiamine and folic acid for alcohol detox. He is currently receiving prn Ativan every few hours. He has tremors but no DTs. It was recommended that the patient  go to residential substance abuse treatment at [facility]. He has failed residential substance abuse treatment in  the past but given liver disease, residential treatment is recommended. He was advised to abstain from alcohol as it may worsen mood symptoms. The patient was also given referral information for tried psychiatric and counseling in [place] as well as [facility] in [place] who does substance abuse counseling.  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 The patient was educated about the fact that alcohol will continue to worsen liver disease. 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 Ultrasound of the abdomen showing no visible stones in the ducts. Likely fatty infiltration of liver or intrinsic liver disease. - MRI of the abdomen shows Hepatomegaly with hepatic steatosis. Appreciate GI consult input, continue steroids orally.  - likely will need outpt Hepatology eval - LFTs improving slowly - trending down (T bili 23.9-->20.6, AST 435-->242).  Neg hepatitis panel.  Disposition: The patient should remain in the hospital until fully detoxed. Would recommend that he go to [facility] for residential substance abuse treatment at the time of discharge. Referral information for [facility] and [facility] will be given to the patient as well as his daughters.   Daily contact with patient to assess and evaluate symptoms and progress in treatment and Medication management   Daily contact with patient to assess and evaluate symptoms and progress in treatment and Medication management  Jay Schlichter, MD 10/09/2015, 6:29 PM

## 2015-10-10 DIAGNOSIS — K7290 Hepatic failure, unspecified without coma: Secondary | ICD-10-CM

## 2015-10-10 DIAGNOSIS — E872 Acidosis, unspecified: Secondary | ICD-10-CM

## 2015-10-10 DIAGNOSIS — K7682 Hepatic encephalopathy: Secondary | ICD-10-CM

## 2015-10-10 DIAGNOSIS — R7989 Other specified abnormal findings of blood chemistry: Secondary | ICD-10-CM

## 2015-10-10 DIAGNOSIS — F329 Major depressive disorder, single episode, unspecified: Secondary | ICD-10-CM

## 2015-10-10 DIAGNOSIS — R531 Weakness: Secondary | ICD-10-CM

## 2015-10-10 DIAGNOSIS — K7011 Alcoholic hepatitis with ascites: Secondary | ICD-10-CM

## 2015-10-10 DIAGNOSIS — Z716 Tobacco abuse counseling: Secondary | ICD-10-CM

## 2015-10-10 DIAGNOSIS — R778 Other specified abnormalities of plasma proteins: Secondary | ICD-10-CM

## 2015-10-10 DIAGNOSIS — E876 Hypokalemia: Secondary | ICD-10-CM

## 2015-10-10 DIAGNOSIS — L409 Psoriasis, unspecified: Secondary | ICD-10-CM

## 2015-10-10 DIAGNOSIS — F419 Anxiety disorder, unspecified: Secondary | ICD-10-CM

## 2015-10-10 DIAGNOSIS — B370 Candidal stomatitis: Secondary | ICD-10-CM

## 2015-10-10 LAB — PHOSPHORUS

## 2015-10-10 LAB — POTASSIUM: POTASSIUM: 4.3 mmol/L (ref 3.5–5.1)

## 2015-10-10 LAB — MAGNESIUM: MAGNESIUM: 2.4 mg/dL (ref 1.7–2.4)

## 2015-10-10 MED ORDER — SPIRONOLACTONE 25 MG PO TABS: 25.0000 mg | ORAL_TABLET | Freq: Every day | ORAL | 5 refills | Status: DC

## 2015-10-10 MED ORDER — FOLIC ACID 1 MG PO TABS: 1.0000 mg | ORAL_TABLET | Freq: Every day | ORAL | 0 refills | Status: DC

## 2015-10-10 MED ORDER — MIRTAZAPINE 15 MG PO TABS: 15.0000 mg | ORAL_TABLET | Freq: Every day | ORAL | 5 refills | Status: DC

## 2015-10-10 MED ORDER — THIAMINE HCL 100 MG PO TABS: 100.0000 mg | ORAL_TABLET | Freq: Every day | ORAL | 0 refills | Status: DC

## 2015-10-10 MED ORDER — PAROXETINE HCL 10 MG PO TABS: 10.0000 mg | ORAL_TABLET | Freq: Every day | ORAL | 0 refills | Status: DC

## 2015-10-10 MED ORDER — PREDNISONE 10 MG (21) PO TBPK: 10.0000 mg | ORAL_TABLET | Freq: Every day | ORAL | 0 refills | Status: DC

## 2015-10-10 MED ORDER — RIFAXIMIN 550 MG PO TABS: 550.0000 mg | ORAL_TABLET | Freq: Two times a day (BID) | ORAL | 5 refills | Status: DC

## 2015-10-10 MED ORDER — NICOTINE 14 MG/24HR TD PT24: 14.0000 mg | MEDICATED_PATCH | Freq: Every day | TRANSDERMAL | 0 refills | Status: DC

## 2015-10-10 NOTE — Evaluation (Signed)
Physical Therapy Evaluation Patient Details Name: Devin Bryant MRN: [id number] DOB: 08/04/1955 Today's Date: 10/10/2015   History of Present Illness  60 yo M presented to ED on 8/2 c/o not feeling well for 3 to 4 weeks, jaundice and distended abdomin. He was admitted for jaundice and alcohol withdrawal and was placed on CIWA protocol. PMH includes adjustment disorder with depression, COPD, increased glucose and HTN.  Clinical Impression  Pt sitting up in recliner having dressed himself upon PT arrival. B LE strength 5/5. Standing dynamic balance normal, reaching out of BOS. Pt ambulated 275 ft without AD and navigated 8 steps with 1 UE support independently. He demonstrated no functional deficits or needs for skilled PT. Pt was educated to return to a daily walking program. Current PT orders will be discharged and pt discharged in house as he has no needs.    Follow Up Recommendations No PT follow up    Equipment Recommendations  None recommended by PT    Recommendations for Other Services       Precautions / Restrictions Precautions Precautions: None Restrictions Weight Bearing Restrictions: No      Mobility  Bed Mobility               General bed mobility comments: up in recliner NT  Transfers Overall transfer level: Independent Equipment used: None             General transfer comment: steady with no LOB  Ambulation/Gait Ambulation/Gait assistance: Independent Ambulation Distance (Feet): 275 Feet Assistive device: None Gait Pattern/deviations: WFL(Within Functional Limits) Gait velocity: WFL Gait velocity interpretation: at or above normal speed for age/gender General Gait Details: good speed and steady pattern  Stairs Stairs: Yes Stairs assistance: Independent Stair Management: One rail Left Number of Stairs: 8 General stair comments: step over step with 1 UE support, steady with no LOB  Wheelchair Mobility    Modified Rankin (Stroke Patients  Only)       Balance Overall balance assessment: Independent                                           Pertinent Vitals/Pain Pain Assessment: No/denies pain    Home Living Family/patient expects to be discharged to:: Private residence Living Arrangements: Spouse/significant other Available Help at Discharge: Family Type of Home: House Home Access: Level entry     Home Layout: Two level        Prior Function Level of Independence: Independent         Comments: Pt was previously independent with all ADLs, driving, etc     Hand Dominance        Extremity/Trunk Assessment   Upper Extremity Assessment: Overall WFL for tasks assessed           Lower Extremity Assessment: Overall WFL for tasks assessed         Communication   Communication: No difficulties  Cognition Arousal/Alertness: Awake/alert Behavior During Therapy: WFL for tasks assessed/performed Overall Cognitive Status: Within Functional Limits for tasks assessed                      General Comments General comments (skin integrity, edema, etc.): Normal standing balance with reaching out of BOS    Exercises        Assessment/Plan    PT Assessment Patent does not need any further PT services  PT Diagnosis  PT Problem List    PT Treatment Interventions     PT Goals (Current goals can be found in the Care Plan section) Acute Rehab PT Goals Patient Stated Goal: to go home PT Goal Formulation: With patient    Frequency     Barriers to discharge        Co-evaluation               End of Session Equipment Utilized During Treatment: Gait belt Activity Tolerance: Patient tolerated treatment well Patient left: in chair Nurse Communication: Mobility status         Time: 1420-1431 PT Time Calculation (min) (ACUTE ONLY): 11 min   Charges:   PT Evaluation $PT Eval Low Complexity: 1 Procedure     PT G Codes:        Adelene Idler, PT,  DPT  10/10/15, 2:55 PM [phone]

## 2015-10-10 NOTE — Progress Notes (Signed)
Pharmacy Consult for electrolyte supplementation Indication: hypokalemia  No Known Allergies  Patient Measurements: Height: 5\' 10"  (177.8 cm) Weight: 181 lb 8 oz (82.3 kg) IBW/kg (Calculated) : 73  Vital Signs: Temp: 97.6 F (36.4 C) (08/07 0509) Temp Source: Oral (08/07 0509) BP: 124/75 (08/07 0509) Pulse Rate: 117 (08/07 0509) Intake/Output from previous day: 08/06 0701 - 08/07 0700 In: 960 [P.O.:860; IV Piggyback:100] Out: -  Intake/Output from this shift: No intake/output data recorded.  Labs:  Recent Labs  10/08/15 0418  WBC 11.7*  HGB 11.5*  HCT 33.5*  PLT 154     Recent Labs  10/08/15 0418 10/08/15 1309 10/09/15 0614 10/09/15 1442 10/10/15 0449  NA 134* 135  --   --   --   K 3.0* 3.5 2.8* 3.4* 4.3  CL 102 106  --   --   --   CO2 24 23  --   --   --   GLUCOSE 111* 112*  --   --   --   BUN <5* <5*  --   --   --   CREATININE <0.30* <0.30*  --   --   --   CALCIUM 7.9* 7.9*  --   --   --   MG  --   --  1.7  --  2.4  PHOS  --   --   --   --  NOT VALID  PROT 6.1*  --  6.1*  --   --   ALBUMIN 2.2*  --  2.1*  --   --   AST 212*  --  192*  --   --   ALT 132*  --  117*  --   --   ALKPHOS 301*  --  276*  --   --   BILITOT 22.7*  --  21.8*  --   --   BILIDIR  --   --  13.5*  --   --   IBILI  --   --  8.3*  --   --    CrCl cannot be calculated (This lab value cannot be used to calculate CrCl because it is not a number: <0.30).   No results for input(s): GLUCAP in the last 72 hours.  Assessment: Pharmacy consulted to monitor/replace electrolytes in this 60 year old male with hypokalemia admitted for alcoholic hepatitis  8/7: K 4.3, Mag 2.4, Phos unable to report - s/p MD orders for KCL 40meq PO Q4H x 5 doses and Mag 4g IV x 1 yesterday plus an additional  KCL 20meq PO once.   Plan:  No further supplementation needed at this time.  Will recheck levels with am labs.    Crist FatHannah Carlette Palmatier, PharmD, BCPS Clinical Pharmacist 10/10/2015 8:04 AM

## 2015-10-10 NOTE — Discharge Summary (Signed)
[facility] [place] [facility]   PATIENT NAME: Devin Bryant    MR#:  [id number]  DATE OF BIRTH:  03-18-1955  DATE OF ADMISSION:  10/05/2015 ADMITTING PHYSICIAN: Alford Highland, MD  DATE OF DISCHARGE: No discharge date for patient encounter.  PRIMARY CARE PHYSICIAN: Baruch Gouty, MD     ADMISSION DIAGNOSIS:  Jaundice [R17] Elevated troponin [R79.89]  DISCHARGE DIAGNOSIS:  Principal Problem:   Jaundice Active Problems:   Alcoholic hepatitis   Lactic acidosis   Alcohol dependence with withdrawal with complication (HCC)   Encephalopathy, hepatic (HCC)   Anxiety and depression   Thrush   Tobacco abuse counseling   Elevated troponin   General weakness   Hypokalemia   Psoriasis   SECONDARY DIAGNOSIS:   Past Medical History:  Diagnosis Date  . Adjustment disorder with depressed mood   . Allergy   . Anxiety   . COPD (chronic obstructive pulmonary disease) (HCC)   . ED (erectile dysfunction)   . Elevated glucose   . Elevated serum glutamic pyruvic transaminase (SGPT) level   . Emphysema lung (HCC)   . GERD (gastroesophageal reflux disease)   . Hypertension   . Hypertriglyceridemia   . Tobacco use     .pro HOSPITAL COURSE:  The patient is 60 year old Caucasian male with history of alcohol abuse who presents to the hospital with complaints of not feeling well for the past 4 weeks, losing weight and having distention in abdomen. He also has complained of having no energy, having poor appetite and not sleeping well. His stool was lighter color. He has been lethargic and family noted him being jaundiced. On arrival to emergency room, he was noted to have markedly elevated total bilirubin 23.9. Hospitalist services were contacted and patient was admitted to the hospital. The patient had numerous radiologic studies of abdomen and liver, essentially all of them revealed hepatomegaly and hepatic steatosis. No biliary tract obstruction, trace volume  of ascites, mild gallbladder wall thickening but no visible stones or sonographic Murphy's sign. Chest x-ray was unremarkable. Studies were negative for hepatitis. A IgM, hepatitis B surface antigen or hepatitis B core antigen body, negative for hep C antibodies as well. It was felt that patient's hepatitis was alcohol related. Patient was consulted by gastroenterologist and psychiatrist, however, refused to go to residential treatment facility. He decided that he would join Alcoholics Anonymous group to help him with alcohol abstinence. No naltrexone was recommended by psychiatrist due to elevated liver enzymes. Gastroenterologist, Dr. Mechele Collin recommended steroid therapy, to be tapered off over 3 weeks.The patient's ammonia level was found to be elevated, he was initiated on Xifaxan and his mental status improved. It was felt that his is stable to be discharged home with home health services today Discussion by problem:  1. 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 Ultrasound of the abdomen showing no visible stones in the ducts. Likely fatty infiltration of liver or intrinsic liver disease. - MRI of the abdomen shows Hepatomegaly with hepatic steatosis. Appreciate GI consult input, continue steroids orally , taper over the next 3 weeks, discussed with Dr. Mechele Collin.  - likely will need outpt Hepatology eval - LFTs improving slowly - trending down (T bili 23.9-->21.8 AST 435--> 192).   Neg hepatitis panel. -  Patient advised that he can no longer drink alcohol. - Continue aldactone 25 mg qd for ascites/LE edema.  - also Prednisone 30mg  qd for alcoholic hepatitis per GI.   2. Hepatic encephalopathy with  elevated ammonia level, hand flap, continue patient on Xifaxan, improved mentally . Not giving lactulose due to diarrhea and hypokalemia  3. Hypokalemia - Repleted orally  - mg was 1.7, 2.4 today  4. Hyponatremia: - Resolved with IV hydration  initially, was felt to be dehydration related, follow as outpatient  5. COPD. Continue inhalers and nebulizer treatments, no exacerbation.  6. Psoriasis: Continue Otezla at home.  7. Anxiety/depression: The patient was seen by psychiatrist, who recommended to wean patient off Paxil, initiated Remeron. Once patient is off Paxil, according to psychiatrist, he should be considered for Celexa or Lexapro, if the Remeron alone does not help. He also should be off Klonopin, as according to Dr. Maryruth Bun, patient should not be on scheduled benzodiazepines secondary to alcohol dependence, he should be detoxed by Ativan, avoiding Librium. Ambien is also discontinued due to COPD and alcohol dependence. Dr. Maryruth Bun also recommended cognitive behavioral therapy and mindfulness therapy to decrease anxiety, she [facility] in [place]. Patient refused residential treatment facility placement , he wants to go to Alcoholics Anonymous group to help him with alcohol abuse issues.   8. Thrush: nystatin swish and swallow, Magic mouthwash  9. Tobacco abuse smoking cessation counseling done 3 minutes by Dr Renae Gloss and nicotine patch applied  10. Lactic acidosis: resolved, likely alcoholic starvation related  11. Elevated troponin , demand ischemia , echo was normal  12. Alcohol withdrawal: Patient received Ativan, Haldol via CIWA scale, reported as 1-2 over the past 12-24 hours, withdrawal is completed  DI SCHARGE CONDITIONS:    stable CONSULTS OBTAINED:  Treatment Team:  Scot Jun, MD Darliss Ridgel, MD Audery Amel, MD  DRUG ALLERGIES:  No Known Allergies  DISCHARGE MEDICATIONS:   Current Discharge Medication List    START taking these medications   Details  folic acid (FOLVITE) 1 MG tablet Take 1 tablet (1 mg total) by mouth daily. Qty: 30 tablet, Refills: 0    mirtazapine (REMERON) 15 MG tablet Take 1 tablet (15 mg total) by mouth at  bedtime. Qty: 30 tablet, Refills: 5    nicotine (NICODERM CQ - DOSED IN MG/24 HOURS) 14 mg/24hr patch Place 1 patch (14 mg total) onto the skin daily. Qty: 28 patch, Refills: 0    predniSONE (STERAPRED UNI-PAK 21 TAB) 10 MG (21) TBPK tablet Take 1 tablet (10 mg total) by mouth daily. Please take 3 pills in the morning on the week one, then 2 pills on then week two, then one pill on the week three, thank you Qty: 42 tablet, Refills: 0    rifaximin (XIFAXAN) 550 MG TABS tablet Take 1 tablet (550 mg total) by mouth 2 (two) times daily. Qty: 60 tablet, Refills: 5    spironolactone (ALDACTONE) 25 MG tablet Take 1 tablet (25 mg total) by mouth daily. Qty: 30 tablet, Refills: 5    thiamine 100 MG tablet Take 1 tablet (100 mg total) by mouth daily. Qty: 30 tablet, Refills: 0      CONTINUE these medications which have NOT CHANGED   Details  ADVAIR HFA 115-21 MCG/ACT inhaler Inhale 2 puffs into the lungs 2 (two) times daily.     albuterol (PROVENTIL HFA;VENTOLIN HFA) 108 (90 BASE) MCG/ACT inhaler Inhale 2 puffs into the lungs every 6 (six) hours as needed for wheezing or shortness of breath.     albuterol (PROVENTIL) (2.5 MG/3ML) 0.083% nebulizer solution Take 3 mLs (2.5 mg total) by nebulization every 4 (four) hours as needed  for wheezing or shortness of breath. Qty: 50 mL, Refills: 3    aspirin EC 81 MG tablet Take 81 mg by mouth daily. Reported on 06/27/2015    Cetirizine HCl (ZYRTEC ALLERGY) 10 MG CAPS Take 10 mg by mouth daily.    Clobetasol Propionate 0.05 % shampoo Apply 1 application topically as needed.     CORDRAN 0.05 % lotion Apply 1 application topically 2 (two) times daily. Reported on 04/04/2015    Desoximetasone (TOPICORT SPRAY EX) Apply 1 application topically 2 (two) times daily as needed (scorisis/irritation over body. Use all over except for on face.). Reported on 04/04/2015    esomeprazole (NEXIUM) 20 MG capsule Take 20 mg by mouth daily at 12 noon.    fluticasone  (FLONASE) 50 MCG/ACT nasal spray Place 1 spray into both nostrils daily.     gabapentin (NEURONTIN) 300 MG capsule Take 300 mg by mouth 2 (two) times daily.    guaiFENesin-codeine 100-10 MG/5ML syrup Take 5 mLs by mouth every 4 (four) hours as needed for cough. Qty: 180 mL, Refills: 0    ketoconazole (NIZORAL) 2 % cream 1 APPLICATION APPLY ON THE SKIN DAILY Refills: 3    mometasone (ELOCON) 0.1 % ointment Apply 1 application topically daily as needed (for ear irritation/eczema in ear). Apply to each ear 1 drop daily as needed    Multiple Vitamin (MULTIVITAMIN) tablet Take 1 tablet by mouth daily.    nystatin (MYCOSTATIN) 100000 UNIT/ML suspension Take 5 mLs by mouth 4 (four) times daily as needed. thrush Refills: 13    theophylline (THEODUR) 300 MG 12 hr tablet Take 300 mg by mouth 2 (two) times daily. Refills: 11    tiotropium (SPIRIVA HANDIHALER) 18 MCG inhalation capsule Place 1 capsule (18 mcg total) into inhaler and inhale daily. Qty: 30 capsule, Refills: 0      STOP taking these medications     clonazePAM (KLONOPIN) 0.5 MG tablet      PARoxetine (PAXIL) 20 MG tablet      predniSONE (DELTASONE) 10 MG tablet      zolpidem (AMBIEN) 5 MG tablet          DISCHARGE INSTRUCTIONS:    Patient is to follow-up with primary care physician, gastroenterologist as outpatient. He would benefit from psychiatric services and alcoholic anonymous group follow up  If you experience worsening of your admission symptoms, develop shortness of breath, life threatening emergency, suicidal or homicidal thoughts you must seek medical attention immediately by calling 911 or calling your MD immediately  if symptoms less severe.  You Must read complete instructions/literature along with all the possible adverse reactions/side effects for all the Medicines you take and that have been prescribed to you. Take any new Medicines after you have completely understood and accept all the possible adverse  reactions/side effects.   Please note  You were cared for by a hospitalist during your hospital stay. If you have any questions about your discharge medications or the care you received while you were in the hospital after you are discharged, you can call the unit and asked to speak with the hospitalist on call if the hospitalist that took care of you is not available. Once you are discharged, your primary care physician will handle any further medical issues. Please note that NO REFILLS for any discharge medications will be authorized once you are discharged, as it is imperative that you return to your primary care physician (or establish a relationship with a primary care physician if you do  not have one) for your aftercare needs so that they can reassess your need for medications and monitor your lab values.    Today   CHIEF COMPLAINT:   Chief Complaint  Patient presents with  . abdominal distention  . Anorexia    HISTORY OF PRESENT ILLNESS:  Devin Bryant  is a 60 y.o. male with a known history of alcohol abuse who presents to the hospital with complaints of not feeling well for the past 4 weeks, losing weight and having distention in abdomen. He also has complained of having no energy, having poor appetite and not sleeping well. His stool was lighter color. He has been lethargic and family noted him being jaundiced. On arrival to emergency room, he was noted to have markedly elevated total bilirubin 23.9. Hospitalist services were contacted and patient was admitted to the hospital. The patient had numerous radiologic studies of abdomen and liver, essentially all of them revealed hepatomegaly and hepatic steatosis. No biliary tract obstruction, trace volume of ascites, mild gallbladder wall thickening but no visible stones or sonographic Murphy's sign. Chest x-ray was unremarkable. Studies were negative for hepatitis. A IgM, hepatitis B surface antigen or hepatitis B core antigen body,  negative for hep C antibodies as well. It was felt that patient's hepatitis was alcohol related. Patient was consulted by gastroenterologist and psychiatrist, however, refused to go to residential treatment facility. He decided that he would join Alcoholics Anonymous group to help him with alcohol abstinence. No naltrexone was recommended by psychiatrist due to elevated liver enzymes. Gastroenterologist, Dr. Mechele Collin recommended steroid therapy, to be tapered off over 3 weeks.The patient's ammonia level was found to be elevated, he was initiated on Xifaxan and his mental status improved. It was felt that his is stable to be discharged home with home health services today Discussion by problem:  1. 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 Ultrasound of the abdomen showing no visible stones in the ducts. Likely fatty infiltration of liver or intrinsic liver disease. - MRI of the abdomen shows Hepatomegaly with hepatic steatosis. Appreciate GI consult input, continue steroids orally , taper over the next 3 weeks, discussed with Dr. Mechele Collin.  - likely will need outpt Hepatology eval - LFTs improving slowly - trending down (T bili 23.9-->21.8 AST 435--> 192).   Neg hepatitis panel. -  Patient advised that he can no longer drink alcohol. - Continue aldactone 25 mg qd for ascites/LE edema.  - also Prednisone 30mg  qd for alcoholic hepatitis per GI.   2. Hepatic encephalopathy with elevated ammonia level, hand flap, continue patient on Xifaxan, improved mentally . Not giving lactulose due to diarrhea and hypokalemia  3. Hypokalemia - Repleted orally  - mg was 1.7, 2.4 today  4. Hyponatremia: - Resolved with IV hydration initially, was felt to be dehydration related, follow as outpatient  5. COPD. Continue inhalers and nebulizer treatments, no exacerbation.  6. Psoriasis: Continue Otezla at home.  7. Anxiety/depression: The patient was seen by  psychiatrist, who recommended to wean patient off Paxil, initiated Remeron. Once patient is off Paxil, according to psychiatrist, he should be considered for Celexa or Lexapro, if the Remeron alone does not help. He also should be off Klonopin, as according to Dr. Maryruth Bun, patient should not be on scheduled benzodiazepines secondary to alcohol dependence, he should be detoxed by Ativan, avoiding Librium. Ambien is also discontinued due to COPD and alcohol dependence. Dr. Maryruth Bun also recommended cognitive behavioral therapy and mindfulness therapy to decrease  anxiety, she [facility] [place]. Patient refused residential treatment facility placement , he wants to go to Alcoholics Anonymous group to help him with alcohol abuse issues.   8. Thrush: nystatin swish and swallow, Magic mouthwash  9. Tobacco abuse smoking cessation counseling done 3 minutes by Dr Renae GlossWieting and nicotine patch applied  10. Lactic acidosis: resolved, likely alcoholic starvation related  11. Elevated troponin , demand ischemia , echo was normal  12. Alcohol withdrawal: Patient received Ativan, Haldol via CIWA scale, reported as 1-2 over the past 12-24 hours, withdrawal is completed    VITAL SIGNS:  Blood pressure 132/86, pulse (!) 106, temperature 98.7 F (37.1 C), temperature source Oral, resp. rate 18, height 5\' 10"  (1.778 m), weight 82.3 kg (181 lb 8 oz), SpO2 97 %.  I/O:   Intake/Output Summary (Last 24 hours) at 10/10/15 1353 Last data filed at 10/10/15 0900  Gross per 24 hour  Intake              500 ml  Output                0 ml  Net              500 ml    PHYSICAL EXAMINATION:  GENERAL:  60 y.o.-year-old patient lying in the bed with no acute distress.  EYES: Pupils equal, round, reactive to light and accommodation. No scleral icterus. Extraocular muscles intact.  HEENT: Head atraumatic, normocephalic. Oropharynx and nasopharynx clear.  NECK:  Supple, no  jugular venous distention. No thyroid enlargement, no tenderness.  LUNGS: Normal breath sounds bilaterally, no wheezing, rales,rhonchi or crepitation. No use of accessory muscles of respiration.  CARDIOVASCULAR: S1, S2 normal. No murmurs, rubs, or gallops.  ABDOMEN: Soft, non-tender, non-distended. Bowel sounds present. Hepatomegaly was noted with hard liver surface EXTREMITIES: No pedal edema, cyanosis, or clubbing.  NEUROLOGIC: Cranial nerves II through XII are intact. Muscle strength 5/5 in all extremities. Sensation intact. Gait not checked.  PSYCHIATRIC: The patient is alert and oriented x 3.  SKIN: No obvious rash, lesion, or ulcer.   DATA REVIEW:   CBC  Recent Labs Lab 10/08/15 0418  WBC 11.7*  HGB 11.5*  HCT 33.5*  PLT 154    Chemistries   Recent Labs Lab 10/08/15 1309 10/09/15 0614  10/10/15 0449  NA 135  --   --   --   K 3.5 2.8*  < > 4.3  CL 106  --   --   --   CO2 23  --   --   --   GLUCOSE 112*  --   --   --   BUN <5*  --   --   --   CREATININE <0.30*  --   --   --   CALCIUM 7.9*  --   --   --   MG  --  1.7  --  2.4  AST  --  192*  --   --   ALT  --  117*  --   --   ALKPHOS  --  276*  --   --   BILITOT  --  21.8*  --   --   < > = values in this interval not displayed.  Cardiac Enzymes  Recent Labs Lab 10/05/15 2027  TROPONINI 0.04*    Microbiology Results  No results found for this or any previous visit.  RADIOLOGY:  No results found.  EKG:  Orders placed or performed during the hospital encounter of 10/05/15  . EKG 12-Lead  . EKG 12-Lead  . ED EKG  . ED EKG  . ED EKG  . ED EKG      Management plans discussed with the patient, family and they are in agreement.  CODE STATUS:     Code Status Orders        Start     Ordered   10/05/15 1513  Full code  Continuous     10/05/15 1512    Code Status History    Date Active Date Inactive Code Status Order ID Comments User Context   10/05/2015  3:13 PM 10/08/2015  4:53 PM Full Code  [id number]  Alford Highland, MD ED   06/27/2015  2:36 PM 06/29/2015  2:34 PM Full Code [id number]  Katha Hamming, MD ED   03/11/2015  5:29 PM 03/14/2015  3:59 PM Full Code [id number]  Dorothea Ogle, MD Inpatient   10/17/2014  2:31 PM 10/19/2014  2:34 PM Full Code [id number]  Shaune Pollack, MD Inpatient    Advance Directive Documentation   Flowsheet Row Most Recent Value  Type of Advance Directive  Living will  Pre-existing out of facility DNR order (yellow form or pink MOST form)  No data  "MOST" Form in Place?  No data      TOTAL TIME TAKING CARE OF THIS Patient 40  minutes.    Katharina Caper M.D on 10/10/2015 at 1:53 PM  Between 7am to 6pm - Pager - [phone]  After 6pm go to [email or web address] - password [email or web address] [facility]  [facility]  Office  [phone]  CC: Primary care physician; Baruch Gouty, MD

## 2015-10-10 NOTE — Progress Notes (Signed)
Patient discharged home per MD order. Prescription given to patient and also called to pharmacy. All discharge instructions given and all questions answered. Escorted by volunteers.

## 2015-10-18 ENCOUNTER — Other Ambulatory Visit: Payer: Self-pay | Admitting: Unknown Physician Specialty

## 2015-10-18 DIAGNOSIS — M7989 Other specified soft tissue disorders: Secondary | ICD-10-CM

## 2015-10-19 ENCOUNTER — Ambulatory Visit
Admission: RE | Admit: 2015-10-19 | Discharge: 2015-10-19 | Disposition: A | Discharge status: Home / Self Care | Payer: BLUE CROSS/BLUE SHIELD | Source: Ambulatory Visit | Attending: Unknown Physician Specialty | Admitting: Unknown Physician Specialty

## 2015-10-19 DIAGNOSIS — M7989 Other specified soft tissue disorders: Secondary | ICD-10-CM | POA: Insufficient documentation

## 2015-10-20 ENCOUNTER — Ambulatory Visit: Payer: BLUE CROSS/BLUE SHIELD

## 2015-12-06 DIAGNOSIS — G621 Alcoholic polyneuropathy: Secondary | ICD-10-CM | POA: Insufficient documentation

## 2015-12-06 LAB — HEMOGLOBIN A1C: Hemoglobin A1C: 5.6

## 2015-12-08 ENCOUNTER — Ambulatory Visit (INDEPENDENT_AMBULATORY_CARE_PROVIDER_SITE_OTHER): Payer: BLUE CROSS/BLUE SHIELD | Admitting: Family Medicine

## 2015-12-08 ENCOUNTER — Encounter: Payer: Self-pay | Admitting: Family Medicine

## 2015-12-08 VITALS — BP 145/82 | HR 123 | Temp 98.7°F | Ht 70.7 in | Wt 167.0 lb

## 2015-12-08 DIAGNOSIS — I10 Essential (primary) hypertension: Secondary | ICD-10-CM | POA: Diagnosis not present

## 2015-12-08 DIAGNOSIS — D7589 Other specified diseases of blood and blood-forming organs: Secondary | ICD-10-CM

## 2015-12-08 DIAGNOSIS — E781 Pure hyperglyceridemia: Secondary | ICD-10-CM

## 2015-12-08 DIAGNOSIS — F419 Anxiety disorder, unspecified: Secondary | ICD-10-CM

## 2015-12-08 DIAGNOSIS — F1021 Alcohol dependence, in remission: Secondary | ICD-10-CM

## 2015-12-08 DIAGNOSIS — F1020 Alcohol dependence, uncomplicated: Secondary | ICD-10-CM

## 2015-12-08 DIAGNOSIS — K7031 Alcoholic cirrhosis of liver with ascites: Secondary | ICD-10-CM

## 2015-12-08 DIAGNOSIS — L409 Psoriasis, unspecified: Secondary | ICD-10-CM

## 2015-12-08 DIAGNOSIS — R7401 Elevation of levels of liver transaminase levels: Secondary | ICD-10-CM

## 2015-12-08 DIAGNOSIS — K7010 Alcoholic hepatitis without ascites: Secondary | ICD-10-CM | POA: Diagnosis not present

## 2015-12-08 DIAGNOSIS — R740 Nonspecific elevation of levels of transaminase and lactic acid dehydrogenase [LDH]: Secondary | ICD-10-CM | POA: Diagnosis not present

## 2015-12-08 DIAGNOSIS — Z23 Encounter for immunization: Secondary | ICD-10-CM | POA: Diagnosis not present

## 2015-12-08 DIAGNOSIS — Z125 Encounter for screening for malignant neoplasm of prostate: Secondary | ICD-10-CM

## 2015-12-08 DIAGNOSIS — E876 Hypokalemia: Secondary | ICD-10-CM

## 2015-12-08 DIAGNOSIS — F418 Other specified anxiety disorders: Secondary | ICD-10-CM

## 2015-12-08 DIAGNOSIS — R7309 Other abnormal glucose: Secondary | ICD-10-CM

## 2015-12-08 DIAGNOSIS — J438 Other emphysema: Secondary | ICD-10-CM | POA: Diagnosis not present

## 2015-12-08 DIAGNOSIS — F32A Depression, unspecified: Secondary | ICD-10-CM

## 2015-12-08 DIAGNOSIS — K7290 Hepatic failure, unspecified without coma: Secondary | ICD-10-CM | POA: Diagnosis not present

## 2015-12-08 DIAGNOSIS — K7030 Alcoholic cirrhosis of liver without ascites: Secondary | ICD-10-CM | POA: Insufficient documentation

## 2015-12-08 DIAGNOSIS — F329 Major depressive disorder, single episode, unspecified: Secondary | ICD-10-CM

## 2015-12-08 DIAGNOSIS — F411 Generalized anxiety disorder: Secondary | ICD-10-CM | POA: Diagnosis not present

## 2015-12-08 DIAGNOSIS — K7682 Hepatic encephalopathy: Secondary | ICD-10-CM

## 2015-12-08 LAB — UA/M W/RFLX CULTURE, ROUTINE
Bilirubin, UA: NEGATIVE
GLUCOSE, UA: NEGATIVE
Leukocytes, UA: NEGATIVE
NITRITE UA: NEGATIVE
RBC, UA: NEGATIVE
SPEC GRAV UA: 1.02 (ref 1.005–1.030)
UUROB: 0.2 mg/dL (ref 0.2–1.0)
pH, UA: 5.5 (ref 5.0–7.5)

## 2015-12-08 LAB — LIPID PANEL PICCOLO, WAIVED
CHOLESTEROL PICCOLO, WAIVED: 213 mg/dL — AB (ref <200)
Chol/HDL Ratio Piccolo,Waive: 3.7 mg/dL
HDL CHOL PICCOLO, WAIVED: 57 mg/dL — AB (ref >59)
LDL CHOL CALC PICCOLO WAIVED: 131 mg/dL — AB (ref <100)
TRIGLYCERIDES PICCOLO,WAIVED: 124 mg/dL (ref <150)
VLDL CHOL CALC PICCOLO,WAIVE: 25 mg/dL (ref <30)

## 2015-12-08 LAB — MICROSCOPIC EXAMINATION
RBC, UA: NONE SEEN /hpf (ref 0–?)
WBC, UA: NONE SEEN /hpf (ref 0–?)

## 2015-12-08 LAB — MICROALBUMIN, URINE WAIVED
Creatinine, Urine Waived: 200 mg/dL (ref 10–300)
Microalb, Ur Waived: 30 mg/L — ABNORMAL HIGH (ref 0–19)

## 2015-12-08 MED ORDER — SPIRONOLACTONE 25 MG PO TABS: 25.0000 mg | ORAL_TABLET | Freq: Every day | ORAL | 1 refills | Status: DC

## 2015-12-08 MED ORDER — RIFAXIMIN 550 MG PO TABS: 550.0000 mg | ORAL_TABLET | Freq: Two times a day (BID) | ORAL | 1 refills | Status: DC

## 2015-12-08 MED ORDER — SPIRONOLACTONE 25 MG PO TABS: 25.0000 mg | ORAL_TABLET | Freq: Every day | ORAL | 0 refills | Status: DC

## 2015-12-08 MED ORDER — RIFAXIMIN 550 MG PO TABS: 550.0000 mg | ORAL_TABLET | Freq: Two times a day (BID) | ORAL | 0 refills | Status: DC

## 2015-12-08 MED ORDER — ESCITALOPRAM OXALATE 5 MG PO TABS: 5.0000 mg | ORAL_TABLET | Freq: Every day | ORAL | 3 refills | Status: DC

## 2015-12-08 MED ORDER — MIRTAZAPINE 15 MG PO TABS: 15.0000 mg | ORAL_TABLET | Freq: Every day | ORAL | 0 refills | Status: DC

## 2015-12-08 MED ORDER — MIRTAZAPINE 15 MG PO TABS: 15.0000 mg | ORAL_TABLET | Freq: Every day | ORAL | 1 refills | Status: DC

## 2015-12-08 NOTE — Assessment & Plan Note (Signed)
Not doing AA. Stable now. Strongly advised getting involved with some group prior to the winter and the holidays as they can be triggers for many people.

## 2015-12-08 NOTE — Assessment & Plan Note (Signed)
[facility]. Continue to monitor.

## 2015-12-08 NOTE — Assessment & Plan Note (Signed)
Not under good control but better on the remeron. Will add low dose lexapro and recheck in 1 month.

## 2015-12-08 NOTE — Assessment & Plan Note (Signed)
Continue to follow with dermatology. Call with concerns. Stable at this time.

## 2015-12-08 NOTE — Assessment & Plan Note (Signed)
To continue to see hepatology. Appointment set up today. To avoid alcohol and other liver toxic medications.

## 2015-12-08 NOTE — Assessment & Plan Note (Signed)
Resolved. Slightly elevated cholesterol. Recheck 6 months fasting.

## 2015-12-08 NOTE — Assessment & Plan Note (Signed)
Rechecking levels today. Await results.  

## 2015-12-08 NOTE — Assessment & Plan Note (Signed)
Likely due to first visit today, will recheck in 1 month.

## 2015-12-08 NOTE — Assessment & Plan Note (Signed)
Following with hepatology. Appointment with them set up today. Refills given of his medication. They will need to determine if he can come off or not. Encouraged continued abstinence.

## 2015-12-08 NOTE — Assessment & Plan Note (Signed)
Rechecking levels today. [facility].

## 2015-12-08 NOTE — Progress Notes (Signed)
BP (!) 145/82 (BP Location: Left Arm, Patient Position: Sitting, Cuff Size: Normal)   Pulse (!) 123   Temp 98.7 F (37.1 C)   Ht 5' 10.7" (1.796 m)   Wt 167 lb (75.8 kg)   SpO2 99%   BMI 23.49 kg/m    Subjective:    Patient ID: Devin Bryant, male    DOB: 1955/09/07, 60 y.o.   MRN: [id number]  HPI: Devin Bryant is a 60 y.o. male  Chief Complaint  Patient presents with  . Medication Refill   Follows with [facility] for neurology and [facility]- saw neurology 2 days ago- for numbness and tingling. Has been taking lyrica with no benefits. Diagnosed with alcoholic neuropathy, stopped lyrica and recommended tramadol if OK with GI for him to take it. Will see them again in February. Rx for tramadol given by neurology yesterday. 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 Had been feeling bad and losing weight had had distention in his belly and light stool as well as severe fatigue. Diagnosed with hepatomegaly and hepatic steatosis with trace ascites. Hepatitis labs were negative (including hep C). He saw psychiatry in the hospital and they recommended residential treatment for his alcoholism. He stated he would join AA. Naltrexone was not recommended due to elevated liver enzymes.  Followed up with Dr. Markham Jordan as an an outpatient who followed his ammonia level. He was started on xifaxan and a steroid taper and his mental status improved. He went home with home health.   Recommended that he follow up with GI, psychiatry and here. Here today for follow up appointment, changing from provider who has left the office.   Been 9 weeks off ETOH- feeling good  HYPERTENSION / HYPERLIPIDEMIA Satisfied with current treatment? yes Duration of hypertension: chronic BP monitoring frequency: not checking BP medication side effects: no Past BP meds: none Duration of hyperlipidemia: chronic Cholesterol medication side effects: Not on anything Aspirin: no Recent  stressors: no Recurrent headaches: no Visual changes: no Palpitations: no Dyspnea: no Chest pain: no Lower extremity edema: no Dizzy/lightheaded: no  DEPRESSION- doing well off the paxil. Started him on remeron. Doing well on it. Helping with sleep. Starts feel really anxious about 1PM the next day Mood status: uncontrolled Satisfied with current treatment?: no Symptom severity: moderate  Duration of current treatment : about 2 months Side effects: no Medication compliance: excellent compliance Psychotherapy/counseling: no  Previous psychiatric medications: paxil, now remeron Depressed mood: yes Anxious mood: yes Anhedonia: no Significant weight loss or gain: yes Insomnia: no  Fatigue: yes Feelings of worthlessness or guilt: no Impaired concentration/indecisiveness: no Suicidal ideations: no Hopelessness: no Crying spells: no Depression screen [facility] 2/9 12/08/2015 04/11/2015 04/04/2015  Decreased Interest 1 0 2  Down, Depressed, Hopeless 1 0 1  PHQ - 2 Score 2 0 3  Altered sleeping 0 0 0  Tired, decreased energy 0 0 0  Change in appetite 1 0 0  Feeling bad or failure about yourself  0 0 1  Trouble concentrating 0 0 0  Moving slowly or fidgety/restless 0 0 0  Suicidal thoughts 0 0 0  PHQ-9 Score 3 0 4  Difficult doing work/chores - Not difficult at all Not difficult at all   GAD 7 : Generalized Anxiety Score 12/08/2015 04/11/2015 04/04/2015  Nervous, Anxious, on Edge 1 2 2   Control/stop worrying 1 2 2   Worry too much - different things 1 2 2   Trouble relaxing 1 2 2   Restless  0 1 1  Easily annoyed or irritable 1 1 1   Afraid - awful might happen 0 1 1  Total GAD 7 Score 5 11 11   Anxiety Difficulty Somewhat difficult Somewhat difficult Somewhat difficult    Active Ambulatory Problems    Diagnosis Date Noted  . Allergy   . Hypertension   . COPD (chronic obstructive pulmonary disease) (HCC)   . ED (erectile dysfunction)   . Hypertriglyceridemia   . Elevated glucose   .  Elevated serum glutamic pyruvic transaminase (SGPT) level   . Generalized anxiety disorder 10/17/2014  . Tobacco abuse 10/19/2014  . Alcoholism in recovery (HCC) 04/04/2015  . Macrocytosis without anemia 04/11/2015  . Acute on chronic respiratory failure (HCC) 06/27/2015  . Alcoholic hepatitis 10/10/2015  . Encephalopathy, hepatic (HCC) 10/10/2015  . Anxiety and depression 10/10/2015  . Hypokalemia 10/10/2015  . Psoriasis 10/10/2015  . Liver cirrhosis, alcoholic (HCC) 12/08/2015   Resolved Ambulatory Problems    Diagnosis Date Noted  . Adjustment disorder with depressed mood   . Tobacco use   . Oral thrush 10/15/2014  . COPD with acute exacerbation (HCC) 10/15/2014  . Acute respiratory failure (HCC) 10/17/2014  . COPD exacerbation (HCC) 10/19/2014  . Alcohol withdrawal (HCC) 03/11/2015  . Jaundice 10/05/2015  . Alcohol dependence with withdrawal with complication (HCC)   . Lactic acidosis 10/10/2015  . General weakness 10/10/2015  . Thrush 10/10/2015  . Tobacco abuse counseling 10/10/2015  . Elevated troponin 10/10/2015   Past Medical History:  Diagnosis Date  . Adjustment disorder with depressed mood   . Allergy   . Anxiety   . COPD (chronic obstructive pulmonary disease) (HCC)   . ED (erectile dysfunction)   . Elevated glucose   . Elevated serum glutamic pyruvic transaminase (SGPT) level   . Emphysema lung (HCC)   . GERD (gastroesophageal reflux disease)   . Hypertension   . Hypertriglyceridemia   . Tobacco use    Past Surgical History:  Procedure Laterality Date  . KNEE SURGERY     arthroscopic  . SEPTOPLASTY  Feb 2016  . TONSILLECTOMY     No Known Allergies Outpatient Encounter Prescriptions as of 12/08/2015  Medication Sig Note  . ADVAIR HFA 115-21 MCG/ACT inhaler Inhale 2 puffs into the lungs 2 (two) times daily.  03/11/2015: .   . albuterol (PROVENTIL HFA;VENTOLIN HFA) 108 (90 BASE) MCG/ACT inhaler Inhale 2 puffs into the lungs every 6 (six) hours as needed  for wheezing or shortness of breath.    Marland Kitchen. albuterol (PROVENTIL) (2.5 MG/3ML) 0.083% nebulizer solution Take 3 mLs (2.5 mg total) by nebulization every 4 (four) hours as needed for wheezing or shortness of breath.   . Apremilast (OTEZLA) 30 MG TABS 2 (two) times daily. 12/08/2015: [facility]  . Cetirizine HCl (ZYRTEC ALLERGY) 10 MG CAPS Take 10 mg by mouth daily.   Marland Kitchen. CORDRAN 0.05 % lotion Apply 1 application topically 2 (two) times daily. Reported on 04/04/2015   . Desoximetasone (TOPICORT SPRAY EX) Apply 1 application topically 2 (two) times daily as needed (scorisis/irritation over body. Use all over except for on face.). Reported on 04/04/2015   . esomeprazole (NEXIUM) 20 MG capsule Take 20 mg by mouth daily at 12 noon.   . fluticasone (FLONASE) 50 MCG/ACT nasal spray Place 1 spray into both nostrils daily.  03/11/2015: .   Marland Kitchen. LYRICA 75 MG capsule 2 (two) times daily. 12/08/2015: Received from: External Pharmacy  . mirtazapine (REMERON) 15 MG tablet Take  1 tablet (15 mg total) by mouth at bedtime.   . Multiple Vitamin (MULTIVITAMIN) tablet Take 1 tablet by mouth daily.   Marland Kitchen nystatin (MYCOSTATIN) 100000 UNIT/ML suspension Take 5 mLs by mouth 4 (four) times daily as needed. thrush 03/11/2015: .   . rifaximin (XIFAXAN) 550 MG TABS tablet Take 1 tablet (550 mg total) by mouth 2 (two) times daily.   Marland Kitchen spironolactone (ALDACTONE) 25 MG tablet Take 1 tablet (25 mg total) by mouth daily.   . theophylline (THEODUR) 300 MG 12 hr tablet Take 300 mg by mouth 2 (two) times daily.   Marland Kitchen tiotropium (SPIRIVA HANDIHALER) 18 MCG inhalation capsule Place 1 capsule (18 mcg total) into inhaler and inhale daily.   . traMADol (ULTRAM) 50 MG tablet 2 (two) times daily. 12/08/2015: Received from: External Pharmacy  . [DISCONTINUED] mirtazapine (REMERON) 15 MG tablet Take 1 tablet (15 mg total) by mouth at bedtime.   . [DISCONTINUED] mirtazapine (REMERON) 15 MG tablet Take 1 tablet (15 mg total) by  mouth at bedtime.   . [DISCONTINUED] rifaximin (XIFAXAN) 550 MG TABS tablet Take 1 tablet (550 mg total) by mouth 2 (two) times daily.   . [DISCONTINUED] rifaximin (XIFAXAN) 550 MG TABS tablet Take 1 tablet (550 mg total) by mouth 2 (two) times daily.   . [DISCONTINUED] spironolactone (ALDACTONE) 25 MG tablet Take 1 tablet (25 mg total) by mouth daily.   . [DISCONTINUED] spironolactone (ALDACTONE) 25 MG tablet Take 1 tablet (25 mg total) by mouth daily.   Marland Kitchen aspirin EC 81 MG tablet Take 81 mg by mouth daily. Reported on 06/27/2015   . Clobetasol Propionate 0.05 % shampoo Apply 1 application topically as needed.    Marland Kitchen escitalopram (LEXAPRO) 5 MG tablet Take 1 tablet (5 mg total) by mouth daily.   Marland Kitchen gabapentin (NEURONTIN) 300 MG capsule Take 300 mg by mouth 2 (two) times daily.   Marland Kitchen ketoconazole (NIZORAL) 2 % cream 1 APPLICATION APPLY ON THE SKIN DAILY   . mometasone (ELOCON) 0.1 % ointment Apply 1 application topically daily as needed (for ear irritation/eczema in ear). Apply to each ear 1 drop daily as needed   . [DISCONTINUED] folic acid (FOLVITE) 1 MG tablet Take 1 tablet (1 mg total) by mouth daily. (Patient not taking: Reported on 12/08/2015)   . [DISCONTINUED] guaiFENesin-codeine 100-10 MG/5ML syrup Take 5 mLs by mouth every 4 (four) hours as needed for cough.   . [DISCONTINUED] nicotine (NICODERM CQ - DOSED IN MG/24 HOURS) 14 mg/24hr patch Place 1 patch (14 mg total) onto the skin daily. (Patient not taking: Reported on 12/08/2015)   . [DISCONTINUED] PARoxetine (PAXIL) 10 MG tablet Take 1 tablet (10 mg total) by mouth daily. (Patient not taking: Reported on 12/08/2015)   . [DISCONTINUED] predniSONE (STERAPRED UNI-PAK 21 TAB) 10 MG (21) TBPK tablet Take 1 tablet (10 mg total) by mouth daily. Please take 3 pills in the morning on the week one, then 2 pills on then week two, then one pill on the week three, thank you   . [DISCONTINUED] thiamine 100 MG tablet Take 1 tablet (100 mg total) by mouth daily.  (Patient not taking: Reported on 12/08/2015)    No facility-administered encounter medications on file as of 12/08/2015.    Social History   Social History  . Marital status: Married    Spouse name: N/A  . Number of children: N/A  . Years of education: N/A   Occupational History  . Not on file.   Social History Main  Topics  . Smoking status: Current Every Day Smoker    Packs/day: 0.50    Years: 40.00    Types: Cigarettes  . Smokeless tobacco: Never Used  . Alcohol use No     Comment: daughter states patient has been drinking heavily for the past three years with increased drinking over the past 6 months.  . Drug use:     Types: Marijuana  . Sexual activity: Not on file   Other Topics Concern  . Not on file   Social History Narrative  . No narrative on file   Family History  Problem Relation Age of Onset  . Diabetes Mother   . CAD Mother   . Cancer Father     lung  . Heart disease Father   . Heart attack Father   . COPD Sister   . Heart disease Brother   . Heart attack Brother   . COPD Brother   . Hypertension Neg Hx   . Stroke Neg Hx      Review of Systems  Constitutional: Negative.   Respiratory: Negative.   Cardiovascular: Negative.   Neurological: Positive for numbness. Negative for dizziness, tremors, seizures, syncope, facial asymmetry, speech difficulty, weakness, light-headedness and headaches.  Psychiatric/Behavioral: Positive for dysphoric mood. Negative for agitation, behavioral problems, confusion, decreased concentration, hallucinations, self-injury, sleep disturbance and suicidal ideas. The patient is nervous/anxious. The patient is not hyperactive.     Per HPI unless specifically indicated above     Objective:    BP (!) 145/82 (BP Location: Left Arm, Patient Position: Sitting, Cuff Size: Normal)   Pulse (!) 123   Temp 98.7 F (37.1 C)   Ht 5' 10.7" (1.796 m)   Wt 167 lb (75.8 kg)   SpO2 99%   BMI 23.49 kg/m   Wt Readings from Last 3  Encounters:  12/08/15 167 lb (75.8 kg)  10/05/15 181 lb 8 oz (82.3 kg)  06/29/15 179 lb (81.2 kg)    Physical Exam  Constitutional: He is oriented to person, place, and time. He appears well-developed and well-nourished. No distress.  HENT:  Head: Normocephalic and atraumatic.  Right Ear: Hearing normal.  Left Ear: Hearing normal.  Nose: Nose normal.  Eyes: Conjunctivae and lids are normal. Right eye exhibits no discharge. Left eye exhibits no discharge. No scleral icterus.  Cardiovascular: Normal rate, regular rhythm, normal heart sounds and intact distal pulses.  Exam reveals no gallop and no friction rub.   No murmur heard. Pulmonary/Chest: Effort normal and breath sounds normal. No respiratory distress. He has no wheezes. He has no rales. He exhibits no tenderness.  Musculoskeletal: Normal range of motion.  Neurological: He is alert and oriented to person, place, and time.  Skin: Skin is warm, dry and intact. No rash noted. No erythema. No pallor.  Psychiatric: He has a normal mood and affect. His speech is normal and behavior is normal. Judgment and thought content normal. Cognition and memory are normal.  Nursing note and vitals reviewed.   Results for orders placed or performed in visit on 12/08/15  Hemoglobin A1c  Result Value Ref Range   Hemoglobin A1C 5.6       Assessment & Plan:   Problem List Items Addressed This Visit      Cardiovascular and Mediastinum   Hypertension - Primary    Likely due to first visit today, will recheck in 1 month.       Relevant Medications   spironolactone (ALDACTONE) 25 MG tablet  Other Relevant Orders   Comprehensive metabolic panel   Microalbumin, Urine Waived   TSH   UA/M w/rflx Culture, Routine     Respiratory   COPD (chronic obstructive pulmonary disease) (HCC)    Stable. Continue current regimen. Continue to monitor. Call with any concerns.         Digestive   Alcoholic hepatitis    Following with hepatology.  Appointment with them set up today. Refills given of his medication. They will need to determine if he can come off or not. Encouraged continued abstinence.       Relevant Orders   Comprehensive metabolic panel   Liver cirrhosis, alcoholic (HCC)    To continue to see hepatology. Appointment set up today. To avoid alcohol and other liver toxic medications.         Nervous and Auditory   Encephalopathy, hepatic [facility])    Following with hepatology. Appointment with them set up today. Refills given of his medication. They will need to determine if he can come off or not. Encouraged continued abstinence.         Musculoskeletal and Integument   Psoriasis    Continue to follow with dermatology. Call with concerns. Stable at this time.         Other   Hypertriglyceridemia    Resolved. Slightly elevated cholesterol. Recheck 6 months fasting.       Relevant Medications   spironolactone (ALDACTONE) 25 MG tablet   Other Relevant Orders   Lipid Panel Piccolo, Waived   Comprehensive metabolic panel   Elevated glucose    A1c done recently at [facility] was 5.6. Continue to monitor.       Relevant Orders   Comprehensive metabolic panel   UA/M w/rflx Culture, Routine   Elevated serum glutamic pyruvic transaminase (SGPT) level    Rechecking levels today. Previously normal at [facility] in September.       Relevant Orders   Comprehensive metabolic panel   Generalized anxiety disorder    Not under good control but better on the remeron. Will add low dose lexapro and recheck in 1 month.       Alcoholism in recovery [facility])    Not doing AA. Stable now. Strongly advised getting involved with some group prior to the winter and the holidays as they can be triggers for many people.       Macrocytosis without anemia    Rechecking levels today. Await results.       Relevant Orders   CBC with Differential/Platelet   Comprehensive metabolic panel   Anxiety and depression    Stable on the remeron but  anxiety not under good control. Will start low dose lexapro and recheck in 1 month. Psychiatry does not recommend any benzos or ambien.       Hypokalemia    Rechecking levels today. Await results.        Other Visit Diagnoses    Screening for prostate cancer       Relevant Orders   PSA   Immunization due       Relevant Orders   Flu Vaccine QUAD 36+ mos PF IM (Fluarix & Fluzone Quad PF) (Completed)       Follow up plan: Return in about 4 weeks (around 01/05/2016) for follow up mood.

## 2015-12-08 NOTE — Patient Instructions (Addendum)
[email or web address]-[email or web address]  Influenza (Flu) Vaccine (Inactivated or Recombinant):  1. Why get vaccinated? Influenza ("flu") is a contagious disease that spreads around the [place] every year, usually between October and May. Flu is caused by influenza viruses, and is spread mainly by coughing, sneezing, and close contact. Anyone can get flu. Flu strikes suddenly and can last several days. Symptoms vary by age, but can include:  fever/chills  sore throat  muscle aches  fatigue  cough  headache  runny or stuffy nose Flu can also lead to pneumonia and blood infections, and cause diarrhea and seizures in children. If you have a medical condition, such as heart or lung disease, flu can make it worse. Flu is more dangerous for some people. Infants and young children, people 62 years of age and older, pregnant women, and people with certain health conditions or a weakened immune system are at greatest risk. Each year thousands of people in the [place] die from flu, and many more are hospitalized. Flu vaccine can:  keep you from getting flu,  make flu less severe if you do get it, and  keep you from spreading flu to your family and other people. 2. Inactivated and recombinant flu vaccines A dose of flu vaccine is recommended every flu season. Children 6 months through 38 years of age may need two doses during the same flu season. Everyone else needs only one dose each flu season. Some inactivated flu vaccines contain a very small amount of a mercury-based preservative called thimerosal. Studies have not shown thimerosal in vaccines to be harmful, but flu vaccines that do not contain thimerosal are available. There is no live flu virus in flu shots. They cannot cause the flu. There are many flu  viruses, and they are always changing. Each year a new flu vaccine is made to protect against three or four viruses that are likely to cause disease in the upcoming flu season. But even when the vaccine doesn't exactly match these viruses, it may still provide some protection. Flu vaccine cannot prevent:  flu that is caused by a virus not covered by the vaccine, or  illnesses that look like flu but are not. It takes about 2 weeks for protection to develop after vaccination, and protection lasts through the flu season. 3. Some people should not get this vaccine Tell the person who is giving you the vaccine:  If you have any severe, life-threatening allergies. If you ever had a life-threatening allergic reaction after a dose of flu vaccine, or have a severe allergy to any part of this vaccine, you may be advised not to get vaccinated. Most, but not all, types of flu vaccine contain a small amount of egg protein.  If you ever had Guillain-Barre Syndrome (also called GBS). Some people with a history of GBS should not get this vaccine. This should be discussed with your doctor.  If you are not feeling well. It is usually okay to get flu vaccine when you have a mild illness, but you might be asked to come back when you feel better. 4. Risks of a vaccine reaction With any medicine, including vaccines, there is a chance of reactions. These are usually mild and go away on their own, but serious reactions are also possible. Most people who get a flu shot do not have any problems with it. Minor problems following a flu shot include:  soreness, redness, or swelling where the shot was given  hoarseness  sore, red or itchy eyes  cough  fever  aches  headache  itching  fatigue If these problems occur, they usually begin soon after the shot and last 1 or 2 days. More serious problems following a flu shot can include the following:  There may be a small increased risk of Guillain-Barre  Syndrome (GBS) after inactivated flu vaccine. This risk has been estimated at 1 or 2 additional cases per million people vaccinated. This is much lower than the risk of severe complications from flu, which can be prevented by flu vaccine.  Young children who get the flu shot along with pneumococcal vaccine (PCV13) and/or DTaP vaccine at the same time might be slightly more likely to have a seizure caused by fever. Ask your doctor for more information. Tell your doctor if a child who is getting flu vaccine has ever had a seizure. Problems that could happen after any injected vaccine:  People sometimes faint after a medical procedure, including vaccination. Sitting or lying down for about 15 minutes can help prevent fainting, and injuries caused by a fall. Tell your doctor if you feel dizzy, or have vision changes or ringing in the ears.  Some people get severe pain in the shoulder and have difficulty moving the arm where a shot was given. This happens very rarely.  Any medication can cause a severe allergic reaction. Such reactions from a vaccine are very rare, estimated at about 1 in a million doses, and would happen within a few minutes to a few hours after the vaccination. As with any medicine, there is a very remote chance of a vaccine causing a serious injury or death. The safety of vaccines is always being monitored. For more information, visit: http://floyd.org/www.cdc.gov/vaccinesafety/ 5. What if there is a serious reaction? What should I look for?  Look for anything that concerns you, such as signs of a severe allergic reaction, very high fever, or unusual behavior. Signs of a severe allergic reaction can include hives, swelling of the face and throat, difficulty breathing, a fast heartbeat, dizziness, and weakness. These would start a few minutes to a few hours after the vaccination. What should I do?  If you think it is a severe allergic reaction or other emergency that can't wait, call 9-1-1 and get  the person to the nearest hospital. Otherwise, call your doctor.  Reactions should be reported to the Vaccine Adverse Event Reporting System (VAERS). Your doctor should file this report, or you can do it yourself through the VAERS web site at www.vaers.LAgents.nohhs.gov, or by calling 1-906 166 5714. VAERS does not give medical advice. 6. The National Vaccine Injury Compensation Program The [employer] Vaccine Injury Compensation Program (VICP) is a federal program that was created to compensate people who may have been injured by certain vaccines. Persons who believe they may have been injured by a vaccine can learn about the program and about filing a claim by calling 1-(662) 553-9102 or visiting the VICP website at SpiritualWord.atwww.hrsa.gov/vaccinecompensation. There is a time limit to file a claim for compensation. 7. How can I learn more?  Ask your healthcare provider. He or she can give you the vaccine package insert or suggest other sources of information.  Call your local or state health department.  Contact the Centers for Disease Control and Prevention (CDC):  Call 734-115-28741-315-584-5680 (1-800-CDC-INFO) or  Visit CDC's website at BiotechRoom.com.cywww.cdc.gov/flu Vaccine Information Statement Inactivated Influenza Vaccine (10/09/2013)   This information is not intended to replace advice given to you by your health care provider. Make sure you discuss any questions you  have with your health care provider.   Document Released: 12/14/2005 Document Revised: 03/12/2014 Document Reviewed: 10/12/2013 Elsevier Interactive Patient Education [employer]

## 2015-12-08 NOTE — Assessment & Plan Note (Signed)
Stable. Continue current regimen. Continue to monitor. Call with any concerns.  

## 2015-12-08 NOTE — Assessment & Plan Note (Signed)
Stable on the remeron but anxiety not under good control. Will start low dose lexapro and recheck in 1 month. Psychiatry does not recommend any benzos or ambien.

## 2015-12-08 NOTE — Assessment & Plan Note (Signed)
Following with hepatology. Appointment with them set up today. Refills given of his medication. They will need to determine if he can come off or not. Encouraged continued abstinence.  

## 2015-12-09 ENCOUNTER — Encounter: Payer: Self-pay | Admitting: Family Medicine

## 2015-12-09 LAB — CBC WITH DIFFERENTIAL/PLATELET
BASOS ABS: 0.1 10*3/uL (ref 0.0–0.2)
BASOS: 1 %
EOS (ABSOLUTE): 0.7 10*3/uL — AB (ref 0.0–0.4)
Eos: 7 %
HEMOGLOBIN: 14.3 g/dL (ref 12.6–17.7)
Hematocrit: 43.9 % (ref 37.5–51.0)
IMMATURE GRANS (ABS): 0.1 10*3/uL (ref 0.0–0.1)
Immature Granulocytes: 1 %
LYMPHS ABS: 1.7 10*3/uL (ref 0.7–3.1)
LYMPHS: 18 %
MCH: 33.9 pg — AB (ref 26.6–33.0)
MCHC: 32.6 g/dL (ref 31.5–35.7)
MCV: 104 fL — AB (ref 79–97)
Monocytes Absolute: 0.9 10*3/uL (ref 0.1–0.9)
Monocytes: 9 %
NEUTROS ABS: 6.3 10*3/uL (ref 1.4–7.0)
Neutrophils: 64 %
PLATELETS: 268 10*3/uL (ref 150–379)
RBC: 4.22 x10E6/uL (ref 4.14–5.80)
RDW: 13.6 % (ref 12.3–15.4)
WBC: 9.8 10*3/uL (ref 3.4–10.8)

## 2015-12-09 LAB — COMPREHENSIVE METABOLIC PANEL
A/G RATIO: 1.2 (ref 1.2–2.2)
ALK PHOS: 71 IU/L (ref 39–117)
ALT: 21 IU/L (ref 0–44)
AST: 26 IU/L (ref 0–40)
Albumin: 3.8 g/dL (ref 3.6–4.8)
BILIRUBIN TOTAL: 0.6 mg/dL (ref 0.0–1.2)
BUN/Creatinine Ratio: 6 — ABNORMAL LOW (ref 10–24)
BUN: 5 mg/dL — ABNORMAL LOW (ref 8–27)
CHLORIDE: 97 mmol/L (ref 96–106)
CO2: 24 mmol/L (ref 18–29)
Calcium: 10.2 mg/dL (ref 8.6–10.2)
Creatinine, Ser: 0.81 mg/dL (ref 0.76–1.27)
GFR calc Af Amer: 110 mL/min/{1.73_m2} (ref >59)
GFR calc non Af Amer: 95 mL/min/{1.73_m2} (ref >59)
GLOBULIN, TOTAL: 3.2 g/dL (ref 1.5–4.5)
Glucose: 119 mg/dL — ABNORMAL HIGH (ref 65–99)
POTASSIUM: 4.6 mmol/L (ref 3.5–5.2)
SODIUM: 139 mmol/L (ref 134–144)
Total Protein: 7 g/dL (ref 6.0–8.5)

## 2015-12-09 LAB — TSH: TSH: 1.32 u[IU]/mL (ref 0.450–4.500)

## 2015-12-09 LAB — PSA: PROSTATE SPECIFIC AG, SERUM: 0.6 ng/mL (ref 0.0–4.0)

## 2016-01-10 ENCOUNTER — Ambulatory Visit: Payer: BLUE CROSS/BLUE SHIELD | Admitting: Family Medicine

## 2016-01-15 ENCOUNTER — Encounter: Payer: Self-pay | Admitting: Emergency Medicine

## 2016-01-15 ENCOUNTER — Emergency Department: Payer: BLUE CROSS/BLUE SHIELD

## 2016-01-15 ENCOUNTER — Inpatient Hospital Stay
Admission: EM | Admit: 2016-01-15 | Discharge: 2016-01-17 | DRG: 190 | Disposition: A | Discharge status: Home / Self Care | Payer: BLUE CROSS/BLUE SHIELD | Attending: Internal Medicine | Admitting: Internal Medicine

## 2016-01-15 DIAGNOSIS — F411 Generalized anxiety disorder: Secondary | ICD-10-CM | POA: Diagnosis present

## 2016-01-15 DIAGNOSIS — J9620 Acute and chronic respiratory failure, unspecified whether with hypoxia or hypercapnia: Secondary | ICD-10-CM | POA: Diagnosis present

## 2016-01-15 DIAGNOSIS — I4729 Other ventricular tachycardia: Secondary | ICD-10-CM

## 2016-01-15 DIAGNOSIS — J9622 Acute and chronic respiratory failure with hypercapnia: Secondary | ICD-10-CM | POA: Diagnosis present

## 2016-01-15 DIAGNOSIS — Z833 Family history of diabetes mellitus: Secondary | ICD-10-CM

## 2016-01-15 DIAGNOSIS — J9621 Acute and chronic respiratory failure with hypoxia: Secondary | ICD-10-CM | POA: Diagnosis present

## 2016-01-15 DIAGNOSIS — R0602 Shortness of breath: Secondary | ICD-10-CM | POA: Diagnosis not present

## 2016-01-15 DIAGNOSIS — D72829 Elevated white blood cell count, unspecified: Secondary | ICD-10-CM | POA: Diagnosis present

## 2016-01-15 DIAGNOSIS — Z7982 Long term (current) use of aspirin: Secondary | ICD-10-CM

## 2016-01-15 DIAGNOSIS — Z825 Family history of asthma and other chronic lower respiratory diseases: Secondary | ICD-10-CM

## 2016-01-15 DIAGNOSIS — K219 Gastro-esophageal reflux disease without esophagitis: Secondary | ICD-10-CM | POA: Diagnosis present

## 2016-01-15 DIAGNOSIS — I2510 Atherosclerotic heart disease of native coronary artery without angina pectoris: Secondary | ICD-10-CM | POA: Diagnosis present

## 2016-01-15 DIAGNOSIS — Z8249 Family history of ischemic heart disease and other diseases of the circulatory system: Secondary | ICD-10-CM

## 2016-01-15 DIAGNOSIS — J9601 Acute respiratory failure with hypoxia: Secondary | ICD-10-CM

## 2016-01-15 DIAGNOSIS — Z7951 Long term (current) use of inhaled steroids: Secondary | ICD-10-CM | POA: Diagnosis not present

## 2016-01-15 DIAGNOSIS — J441 Chronic obstructive pulmonary disease with (acute) exacerbation: Secondary | ICD-10-CM | POA: Diagnosis not present

## 2016-01-15 DIAGNOSIS — F1010 Alcohol abuse, uncomplicated: Secondary | ICD-10-CM

## 2016-01-15 DIAGNOSIS — F17210 Nicotine dependence, cigarettes, uncomplicated: Secondary | ICD-10-CM | POA: Diagnosis present

## 2016-01-15 DIAGNOSIS — I472 Ventricular tachycardia: Secondary | ICD-10-CM | POA: Diagnosis present

## 2016-01-15 DIAGNOSIS — I10 Essential (primary) hypertension: Secondary | ICD-10-CM | POA: Diagnosis present

## 2016-01-15 DIAGNOSIS — T380X5A Adverse effect of glucocorticoids and synthetic analogues, initial encounter: Secondary | ICD-10-CM | POA: Diagnosis present

## 2016-01-15 DIAGNOSIS — Z87891 Personal history of nicotine dependence: Secondary | ICD-10-CM

## 2016-01-15 DIAGNOSIS — K7030 Alcoholic cirrhosis of liver without ascites: Secondary | ICD-10-CM | POA: Diagnosis present

## 2016-01-15 DIAGNOSIS — Z9981 Dependence on supplemental oxygen: Secondary | ICD-10-CM

## 2016-01-15 HISTORY — DX: Liver disease, unspecified: K76.9

## 2016-01-15 HISTORY — DX: Polyneuropathy, unspecified: G62.9

## 2016-01-15 LAB — TROPONIN I

## 2016-01-15 LAB — CBC
HCT: 45.7 % (ref 40.0–52.0)
Hemoglobin: 15.5 g/dL (ref 13.0–18.0)
MCH: 32.3 pg (ref 26.0–34.0)
MCHC: 33.9 g/dL (ref 32.0–36.0)
MCV: 95.3 fL (ref 80.0–100.0)
PLATELETS: 196 10*3/uL (ref 150–440)
RBC: 4.8 MIL/uL (ref 4.40–5.90)
RDW: 14.2 % (ref 11.5–14.5)
WBC: 11.6 10*3/uL — ABNORMAL HIGH (ref 3.8–10.6)

## 2016-01-15 LAB — BASIC METABOLIC PANEL
ANION GAP: 8 (ref 5–15)
BUN: 8 mg/dL (ref 6–20)
CALCIUM: 10.2 mg/dL (ref 8.9–10.3)
CO2: 34 mmol/L — AB (ref 22–32)
CREATININE: 0.62 mg/dL (ref 0.61–1.24)
Chloride: 98 mmol/L — ABNORMAL LOW (ref 101–111)
Glucose, Bld: 120 mg/dL — ABNORMAL HIGH (ref 65–99)
Potassium: 4 mmol/L (ref 3.5–5.1)
SODIUM: 140 mmol/L (ref 135–145)

## 2016-01-15 LAB — INFLUENZA PANEL BY PCR (TYPE A & B)
INFLBPCR: NEGATIVE
Influenza A By PCR: NEGATIVE

## 2016-01-15 MED ORDER — THEOPHYLLINE ER 300 MG PO TB12
300.0000 mg | ORAL_TABLET | Freq: Two times a day (BID) | ORAL | Status: DC
  Administered 2016-01-15 – 2016-01-17 (×4): 300 mg via ORAL
  Filled 2016-01-15 (×5): qty 1

## 2016-01-15 MED ORDER — TIOTROPIUM BROMIDE MONOHYDRATE 18 MCG IN CAPS
18.0000 ug | ORAL_CAPSULE | Freq: Every day | RESPIRATORY_TRACT | Status: DC
  Administered 2016-01-16 – 2016-01-17 (×2): 18 ug via RESPIRATORY_TRACT
  Filled 2016-01-15: qty 5

## 2016-01-15 MED ORDER — SPIRONOLACTONE 25 MG PO TABS
25.0000 mg | ORAL_TABLET | Freq: Every day | ORAL | Status: DC
  Administered 2016-01-16 – 2016-01-17 (×2): 25 mg via ORAL
  Filled 2016-01-15 (×2): qty 1

## 2016-01-15 MED ORDER — IOPAMIDOL (ISOVUE-370) INJECTION 76%
75.0000 mL | Freq: Once | INTRAVENOUS | Status: AC | PRN
  Administered 2016-01-15: 75 mL via INTRAVENOUS

## 2016-01-15 MED ORDER — HEPARIN SODIUM (PORCINE) 5000 UNIT/ML IJ SOLN
5000.0000 [IU] | Freq: Three times a day (TID) | INTRAMUSCULAR | Status: DC
  Administered 2016-01-15 – 2016-01-17 (×5): 5000 [IU] via SUBCUTANEOUS
  Filled 2016-01-15 (×5): qty 1

## 2016-01-15 MED ORDER — RIFAXIMIN 550 MG PO TABS
550.0000 mg | ORAL_TABLET | Freq: Two times a day (BID) | ORAL | Status: DC
  Administered 2016-01-15 – 2016-01-17 (×4): 550 mg via ORAL
  Filled 2016-01-15 (×4): qty 1

## 2016-01-15 MED ORDER — DOCUSATE SODIUM 100 MG PO CAPS
100.0000 mg | ORAL_CAPSULE | Freq: Two times a day (BID) | ORAL | Status: DC
  Filled 2016-01-15: qty 1

## 2016-01-15 MED ORDER — LORATADINE 10 MG PO TABS
10.0000 mg | ORAL_TABLET | Freq: Every day | ORAL | Status: DC
  Administered 2016-01-16 – 2016-01-17 (×2): 10 mg via ORAL
  Filled 2016-01-15 (×2): qty 1

## 2016-01-15 MED ORDER — GABAPENTIN 300 MG PO CAPS
300.0000 mg | ORAL_CAPSULE | Freq: Two times a day (BID) | ORAL | Status: DC
  Filled 2016-01-15: qty 1

## 2016-01-15 MED ORDER — LEVOFLOXACIN 750 MG PO TABS
750.0000 mg | ORAL_TABLET | Freq: Once | ORAL | Status: AC
  Administered 2016-01-15: 750 mg via ORAL
  Filled 2016-01-15: qty 1

## 2016-01-15 MED ORDER — IPRATROPIUM-ALBUTEROL 0.5-2.5 (3) MG/3ML IN SOLN
3.0000 mL | Freq: Once | RESPIRATORY_TRACT | Status: DC
  Filled 2016-01-15: qty 3

## 2016-01-15 MED ORDER — ACETAMINOPHEN 650 MG RE SUPP
650.0000 mg | Freq: Four times a day (QID) | RECTAL | Status: DC | PRN
Start: 2016-01-15 — End: 2016-01-17

## 2016-01-15 MED ORDER — METHYLPREDNISOLONE SODIUM SUCC 125 MG IJ SOLR
60.0000 mg | Freq: Two times a day (BID) | INTRAMUSCULAR | Status: DC
  Administered 2016-01-16 – 2016-01-17 (×3): 60 mg via INTRAVENOUS
  Filled 2016-01-15 (×3): qty 2

## 2016-01-15 MED ORDER — MIRTAZAPINE 15 MG PO TABS
15.0000 mg | ORAL_TABLET | Freq: Every day | ORAL | Status: DC
  Administered 2016-01-15 – 2016-01-16 (×2): 15 mg via ORAL
  Filled 2016-01-15 (×2): qty 1

## 2016-01-15 MED ORDER — ONDANSETRON HCL 4 MG/2ML IJ SOLN: 4.0000 mg | Freq: Four times a day (QID) | INTRAMUSCULAR | Status: DC | PRN

## 2016-01-15 MED ORDER — IPRATROPIUM-ALBUTEROL 0.5-2.5 (3) MG/3ML IN SOLN
9.0000 mL | Freq: Once | RESPIRATORY_TRACT | Status: AC
  Administered 2016-01-15: 9 mL via RESPIRATORY_TRACT
  Filled 2016-01-15: qty 9

## 2016-01-15 MED ORDER — HYDROCORTISONE VALERATE 0.2 % EX OINT
TOPICAL_OINTMENT | Freq: Every day | CUTANEOUS | Status: DC | PRN
  Filled 2016-01-15: qty 15

## 2016-01-15 MED ORDER — SODIUM CHLORIDE 0.9 % IV BOLUS (SEPSIS)
500.0000 mL | Freq: Once | INTRAVENOUS | Status: AC
  Administered 2016-01-15: 500 mL via INTRAVENOUS

## 2016-01-15 MED ORDER — ONDANSETRON HCL 4 MG PO TABS: 4.0000 mg | ORAL_TABLET | Freq: Four times a day (QID) | ORAL | Status: DC | PRN

## 2016-01-15 MED ORDER — PREGABALIN 75 MG PO CAPS
75.0000 mg | ORAL_CAPSULE | Freq: Two times a day (BID) | ORAL | Status: DC
  Administered 2016-01-15 – 2016-01-17 (×4): 75 mg via ORAL
  Filled 2016-01-15 (×4): qty 1

## 2016-01-15 MED ORDER — ASPIRIN EC 81 MG PO TBEC
81.0000 mg | DELAYED_RELEASE_TABLET | Freq: Every day | ORAL | Status: DC
  Filled 2016-01-15: qty 1

## 2016-01-15 MED ORDER — BISACODYL 5 MG PO TBEC: 5.0000 mg | DELAYED_RELEASE_TABLET | Freq: Every day | ORAL | Status: DC | PRN

## 2016-01-15 MED ORDER — MOMETASONE FUROATE 0.1 % EX OINT: 1.0000 "application " | TOPICAL_OINTMENT | Freq: Every day | CUTANEOUS | Status: DC | PRN

## 2016-01-15 MED ORDER — ALBUTEROL SULFATE (2.5 MG/3ML) 0.083% IN NEBU: 5.0000 mg | INHALATION_SOLUTION | Freq: Once | RESPIRATORY_TRACT | Status: DC

## 2016-01-15 MED ORDER — FLUTICASONE PROPIONATE 50 MCG/ACT NA SUSP
1.0000 | Freq: Every day | NASAL | Status: DC
  Administered 2016-01-16 – 2016-01-17 (×2): 1 via NASAL
  Filled 2016-01-15: qty 16

## 2016-01-15 MED ORDER — ADULT MULTIVITAMIN W/MINERALS CH
1.0000 | ORAL_TABLET | Freq: Every day | ORAL | Status: DC
  Administered 2016-01-16 – 2016-01-17 (×2): 1 via ORAL
  Filled 2016-01-15 (×2): qty 1

## 2016-01-15 MED ORDER — SODIUM CHLORIDE 0.9 % IV SOLN
INTRAVENOUS | Status: DC
  Administered 2016-01-15 – 2016-01-16 (×2): via INTRAVENOUS

## 2016-01-15 MED ORDER — PANTOPRAZOLE SODIUM 40 MG PO TBEC
40.0000 mg | DELAYED_RELEASE_TABLET | Freq: Every day | ORAL | Status: DC
  Administered 2016-01-15 – 2016-01-17 (×3): 40 mg via ORAL
  Filled 2016-01-15 (×4): qty 1

## 2016-01-15 MED ORDER — ALBUTEROL SULFATE (2.5 MG/3ML) 0.083% IN NEBU
5.0000 mg | INHALATION_SOLUTION | Freq: Once | RESPIRATORY_TRACT | Status: AC
  Administered 2016-01-15: 5 mg via RESPIRATORY_TRACT
  Filled 2016-01-15: qty 6

## 2016-01-15 MED ORDER — ESCITALOPRAM OXALATE 10 MG PO TABS
5.0000 mg | ORAL_TABLET | Freq: Every day | ORAL | Status: DC
  Administered 2016-01-16 – 2016-01-17 (×2): 5 mg via ORAL
  Filled 2016-01-15 (×2): qty 1

## 2016-01-15 MED ORDER — LEVALBUTEROL HCL 0.63 MG/3ML IN NEBU
0.6300 mg | INHALATION_SOLUTION | Freq: Four times a day (QID) | RESPIRATORY_TRACT | Status: DC
  Administered 2016-01-16: 0.63 mg via RESPIRATORY_TRACT
  Filled 2016-01-15 (×2): qty 3

## 2016-01-15 MED ORDER — METHYLPREDNISOLONE SODIUM SUCC 125 MG IJ SOLR
125.0000 mg | Freq: Once | INTRAMUSCULAR | Status: AC
  Administered 2016-01-15: 125 mg via INTRAVENOUS
  Filled 2016-01-15: qty 2

## 2016-01-15 MED ORDER — ACETAMINOPHEN 325 MG PO TABS: 650.0000 mg | ORAL_TABLET | Freq: Four times a day (QID) | ORAL | Status: DC | PRN

## 2016-01-15 NOTE — ED Notes (Signed)
Patient transported to CT 

## 2016-01-15 NOTE — ED Triage Notes (Signed)
C/O progressively worsening SOB x 3 weeks.  Seen through urgent care about 2 weeks ago and treated with Z-pack and prednisone, no improvement.  Patient has history of COPD and wears oxygen prn, usually at night with an exacerbation.  Patient states he has been wearing 3-4 L/[place] all of the time for the past 3-4 days.

## 2016-01-15 NOTE — ED Notes (Signed)
Patient ambulated in hallway on RA. Patient did not tolerate well. SpO2 dropped to 84%. Labored breathing noted. Patient returned immediately to room and placed on 3L O2 [place]. Patient back up to 95%. Patient agreeing to be admitted at this time.

## 2016-01-15 NOTE — ED Provider Notes (Signed)
[facility] Emergency Department Provider Note  ____________________________________________  Time seen: Approximately 6:10 PM  I have reviewed the triage vital signs and the nursing notes.   HISTORY  Chief Complaint Shortness of Breath   HPI Devin Bryant is a 60 y.o. male a history of COPD, hypertension, hyperlipidemia who presents for evaluation of shortness of breath and wheezing. Patient reports 2 weeks of progressively worsening shortness of breath. He saw his doctor 2 weeks ago who started him on prednisone and 10 days of azithromycin. He reports no improvement of his symptoms after finishing these medications 3 days ago. No fever or chills, no chest pain. He does endorse that the shortness of breath and coughing are worse when lying flat. He has no h/o of CHF and last ECHO was normal 10/2015. Patient usually does not have oxygen requirement baseline however for the last 2-3 days has been using 3-4 L nasal cannula 24 hours a day. He denies any personal or family history of blood clots, recent travel or immobilization, leg pain or swelling, exogenous hormones, hemoptysis. He is up-to-date with his vaccinations including the flu swab.  Past Medical History:  Diagnosis Date  . Adjustment disorder with depressed mood   . Allergy   . Anxiety   . COPD (chronic obstructive pulmonary disease) (HCC)   . ED (erectile dysfunction)   . Elevated glucose   . Elevated serum glutamic pyruvic transaminase (SGPT) level   . Emphysema lung (HCC)   . GERD (gastroesophageal reflux disease)   . Hypertension   . Hypertriglyceridemia   . Liver disease   . Neuropathy (HCC)   . Tobacco use     Patient Active Problem List   Diagnosis Date Noted  . Liver cirrhosis, alcoholic (HCC) 12/08/2015  . Alcoholic hepatitis 10/10/2015  . Encephalopathy, hepatic (HCC) 10/10/2015  . Anxiety and depression 10/10/2015  . Hypokalemia 10/10/2015  . Psoriasis 10/10/2015  . Acute on  chronic respiratory failure (HCC) 06/27/2015  . Macrocytosis without anemia 04/11/2015  . Alcoholism in recovery (HCC) 04/04/2015  . Tobacco abuse 10/19/2014  . Generalized anxiety disorder 10/17/2014  . Allergy   . Hypertension   . COPD (chronic obstructive pulmonary disease) (HCC)   . ED (erectile dysfunction)   . Hypertriglyceridemia   . Elevated glucose   . Elevated serum glutamic pyruvic transaminase (SGPT) level     Past Surgical History:  Procedure Laterality Date  . KNEE SURGERY     arthroscopic  . SEPTOPLASTY  Feb 2016  . TONSILLECTOMY      Prior to Admission medications   Medication Sig Start Date End Date Taking? Authorizing Provider  ADVAIR HFA 115-21 MCG/ACT inhaler Inhale 2 puffs into the lungs 2 (two) times daily.  10/11/14   Historical Provider, MD  albuterol (PROVENTIL HFA;VENTOLIN HFA) 108 (90 BASE) MCG/ACT inhaler Inhale 2 puffs into the lungs every 6 (six) hours as needed for wheezing or shortness of breath.     Historical Provider, MD  albuterol (PROVENTIL) (2.5 MG/3ML) 0.083% nebulizer solution Take 3 mLs (2.5 mg total) by nebulization every 4 (four) hours as needed for wheezing or shortness of breath. 10/15/14   Kerman PasseyMelinda P Lada, MD  Apremilast (OTEZLA) 30 MG TABS 2 (two) times daily. 10/05/15   Historical Provider, MD  aspirin EC 81 MG tablet Take 81 mg by mouth daily. Reported on 06/27/2015    Historical Provider, MD  Cetirizine HCl (ZYRTEC ALLERGY) 10 MG CAPS Take 10 mg by mouth daily.    Historical  Provider, MD  Clobetasol Propionate 0.05 % shampoo Apply 1 application topically as needed.  03/31/15   Historical Provider, MD  CORDRAN 0.05 % lotion Apply 1 application topically 2 (two) times daily. Reported on 04/04/2015 03/09/15   Historical Provider, MD  Desoximetasone (TOPICORT SPRAY EX) Apply 1 application topically 2 (two) times daily as needed (scorisis/irritation over body. Use all over except for on face.). Reported on 04/04/2015    Historical Provider, MD    escitalopram (LEXAPRO) 5 MG tablet Take 1 tablet (5 mg total) by mouth daily. 12/08/15   Megan P Johnson, DO  esomeprazole (NEXIUM) 20 MG capsule Take 20 mg by mouth daily at 12 noon.    Historical Provider, MD  fluticasone (FLONASE) 50 MCG/ACT nasal spray Place 1 spray into both nostrils daily.  07/23/14   Historical Provider, MD  gabapentin (NEURONTIN) 300 MG capsule Take 300 mg by mouth 2 (two) times daily. 03/22/15   Historical Provider, MD  ketoconazole (NIZORAL) 2 % cream 1 APPLICATION APPLY ON THE SKIN DAILY 03/30/15   Historical Provider, MD  LYRICA 75 MG capsule 2 (two) times daily. 11/15/15   Historical Provider, MD  mirtazapine (REMERON) 15 MG tablet Take 1 tablet (15 mg total) by mouth at bedtime. 12/08/15   Megan P Johnson, DO  mometasone (ELOCON) 0.1 % ointment Apply 1 application topically daily as needed (for ear irritation/eczema in ear). Apply to each ear 1 drop daily as needed    Historical Provider, MD  Multiple Vitamin (MULTIVITAMIN) tablet Take 1 tablet by mouth daily.    Historical Provider, MD  nystatin (MYCOSTATIN) 100000 UNIT/ML suspension Take 5 mLs by mouth 4 (four) times daily as needed. thrush 02/08/15   Historical Provider, MD  rifaximin (XIFAXAN) 550 MG TABS tablet Take 1 tablet (550 mg total) by mouth 2 (two) times daily. 12/08/15   Megan P Johnson, DO  spironolactone (ALDACTONE) 25 MG tablet Take 1 tablet (25 mg total) by mouth daily. 12/08/15   Megan P Johnson, DO  theophylline (THEODUR) 300 MG 12 hr tablet Take 300 mg by mouth 2 (two) times daily. 03/02/15   Historical Provider, MD  tiotropium (SPIRIVA HANDIHALER) 18 MCG inhalation capsule Place 1 capsule (18 mcg total) into inhaler and inhale daily. 10/19/14   Srikar Sudini, MD  traMADol (ULTRAM) 50 MG tablet 2 (two) times daily. 12/07/15   Historical Provider, MD    Allergies Patient has no known allergies.  Family History  Problem Relation Age of Onset  . Diabetes Mother   . CAD Mother   . Cancer Father     lung   . Heart disease Father   . Heart attack Father   . COPD Sister   . Heart disease Brother   . Heart attack Brother   . COPD Brother   . Hypertension Neg Hx   . Stroke Neg Hx     Social History Social History  Substance Use Topics  . Smoking status: Current Every Day Smoker    Packs/day: 0.50    Years: 40.00    Types: Cigarettes  . Smokeless tobacco: Never Used  . Alcohol use No     Comment: daughter states patient has been drinking heavily for the past three years with increased drinking over the past 6 months.    Review of Systems  Constitutional: Negative for fever. Eyes: Negative for visual changes. ENT: Negative for sore throat. Cardiovascular: Negative for chest pain. Respiratory: + shortness of breath and wheezing Gastrointestinal: Negative for abdominal pain, vomiting  or diarrhea. Genitourinary: Negative for dysuria. Musculoskeletal: Negative for back pain. Skin: Negative for rash. Neurological: Negative for headaches, weakness or numbness.  ____________________________________________   PHYSICAL EXAM:  VITAL SIGNS: ED Triage Vitals  Enc Vitals Group     BP 01/15/16 1422 (!) 145/81     Pulse Rate 01/15/16 1422 (!) 116     Resp 01/15/16 1422 (!) 22     Temp 01/15/16 1422 97.7 F (36.5 C)     Temp Source 01/15/16 1422 Oral     SpO2 01/15/16 1422 90 %     Weight 01/15/16 1423 170 lb (77.1 kg)     Height 01/15/16 1423 5\' 10"  (1.778 m)     Head Circumference --      Peak Flow --      Pain Score 01/15/16 1424 0     Pain Loc --      Pain Edu? --      Excl. in GC? --     Constitutional: Alert and oriented. Well appearing and in no apparent distress. HEENT:      Head: Normocephalic and atraumatic.         Eyes: Conjunctivae are normal. Sclera is non-icteric. EOMI. PERRL      Mouth/Throat: Mucous membranes are moist.       Neck: Supple with no signs of meningismus. Cardiovascular: Tachycardic with regular rhythm. No murmurs, gallops, or rubs. 2+  symmetrical distal pulses are present in all extremities. No JVD. Respiratory: Tachypneic, sating 90% RA which improves to high 90s on 4L [place], severely diminished air movement bilaterally with faint expiratory wheezes  Gastrointestinal: Soft, non tender, and non distended with positive bowel sounds. No rebound or guarding. Musculoskeletal: Nontender with normal range of motion in all extremities. No edema, cyanosis, or erythema of extremities. Neurologic: Normal speech and language. Face is symmetric. Moving all extremities. No gross focal neurologic deficits are appreciated. Skin: Skin is warm, dry and intact. No rash noted. Psychiatric: Mood and affect are normal. Speech and behavior are normal.  ____________________________________________   LABS (all labs ordered are listed, but only abnormal results are displayed)  Labs Reviewed  BASIC METABOLIC PANEL - Abnormal; Notable for the following:       Result Value   Chloride 98 (*)    CO2 34 (*)    Glucose, Bld 120 (*)    All other components within normal limits  CBC - Abnormal; Notable for the following:    WBC 11.6 (*)    All other components within normal limits  TROPONIN I  TROPONIN I  INFLUENZA PANEL BY PCR (TYPE A & B, H1N1)   ____________________________________________  EKG  ED ECG REPORT I, Nita Sickle, the attending physician, personally viewed and interpreted this ECG.  Sinus tachycardia, rate of 113, normal intervals, normal axis, no ST elevations or depressions. ____________________________________________  RADIOLOGY  CXR: Negative ____________________________________________   PROCEDURES  Procedure(s) performed: None Procedures Critical Care performed: yes  CRITICAL CARE Performed by: Nita Sickle  ?  Total critical care time: 35 min  Critical care time was exclusive of separately billable procedures and treating other patients.  Critical care was necessary to treat or prevent imminent  or life-threatening deterioration.  Critical care was time spent personally by me on the following activities: development of treatment plan with patient and/or surrogate as well as nursing, discussions with consultants, evaluation of patient's response to treatment, examination of patient, obtaining history from patient or surrogate, ordering and performing treatments and interventions, ordering and  review of laboratory studies, ordering and review of radiographic studies, pulse oximetry and re-evaluation of patient's condition.  ____________________________________________   INITIAL IMPRESSION / ASSESSMENT AND PLAN / ED COURSE  60 y.o. male a history of COPD, hypertension, hyperlipidemia who presents for evaluation of shortness of breath and wheezing x 2 weeks not improved with that and a course of albuterol and prednisone. Patient is now requiring oxygen at home. Patient with tachypnea, tachycardia, and hypoxic on room air, severely diminished air movement bilaterally with faint expiratory wheezes. No pitting edema. EKG showed sinus tachycardia with no evidence of ischemia. Chest x-ray with no infiltrate. First troponin is negative. Patient was given 3 DuoNeb treatments with mild improvement of his symptoms. Continues to have decreased air movement and wheezing. CT of the chest was ordered to rule out PE and that is pending. Patient was given Levaquin and Solu-Medrol.  Clinical Course as of Jan 15 1956  Wynelle Link Jan 15, 2016  [id number] Patient ambulated in the ED and desat to 84%. Flu is negative. CT of the chest with no evidence of PE. We'll give another round of albuterol and admitted to the hospitalist service.  [CV]    Clinical Course User Index [CV] Nita Sickle, MD    Pertinent labs & imaging results that were available during my care of the patient were reviewed by me and considered in my medical decision making (see chart for  details).    ____________________________________________   FINAL CLINICAL IMPRESSION(S) / ED DIAGNOSES  Final diagnoses:  Acute respiratory failure with hypoxia (HCC)      NEW MEDICATIONS STARTED DURING THIS VISIT:  New Prescriptions   No medications on file     Note:  This document was prepared using Dragon voice recognition software and may include unintentional dictation errors.    Nita Sickle, MD 01/15/16 [phone]

## 2016-01-15 NOTE — H&P (Addendum)
[facility] - [place] at [facility]   PATIENT NAME: Devin Bryant    MR#:  [id number]  DATE OF BIRTH:  1955-12-14  DATE OF ADMISSION:  01/15/2016  PRIMARY CARE PHYSICIAN: Olevia Perches, DO   REQUESTING/REFERRING PHYSICIAN: Dr. Don Perking  CHIEF COMPLAINT:SOB   Chief Complaint  Patient presents with  . Shortness of Breath    HISTORY OF PRESENT ILLNESS:  Devin Bryant  is a 60 y.o. male with a known history of COPD, heavy to Use on home oxygen at night comes in because of shortness of breath for 2 weeks, cough, wheezing for 2 weeks. Patient's primary pulmonologist Dr. Meredeth Ide on the patient on October 24, prescribed  prednisone, azithromycin,  He  finished. Patient continued to have wheezing, shortness of breath with minimal exertion. Pt  is needing oxygen on continuous basis 2-3 L. He uses oxygen only at night but for  last 2 weeks she is using oxygen continuously.  PAST MEDICAL HISTORY:   Past Medical History:  Diagnosis Date  . Adjustment disorder with depressed mood   . Allergy   . Anxiety   . COPD (chronic obstructive pulmonary disease) (HCC)   . ED (erectile dysfunction)   . Elevated glucose   . Elevated serum glutamic pyruvic transaminase (SGPT) level   . Emphysema lung (HCC)   . GERD (gastroesophageal reflux disease)   . Hypertension   . Hypertriglyceridemia   . Liver disease   . Neuropathy (HCC)   . Tobacco use     PAST SURGICAL HISTOIRY:   Past Surgical History:  Procedure Laterality Date  . KNEE SURGERY     arthroscopic  . SEPTOPLASTY  Feb 2016  . TONSILLECTOMY      SOCIAL HISTORY:   Social History  Substance Use Topics  . Smoking status: Current Every Day Smoker    Packs/day: 0.50    Years: 40.00    Types: Cigarettes  . Smokeless tobacco: Never Used  . Alcohol use No     Comment: daughter states patient has been drinking heavily for the past three years with increased drinking over the past 6 months.    FAMILY  HISTORY:   Family History  Problem Relation Age of Onset  . Diabetes Mother   . CAD Mother   . Cancer Father     lung  . Heart disease Father   . Heart attack Father   . COPD Sister   . Heart disease Brother   . Heart attack Brother   . COPD Brother   . Hypertension Neg Hx   . Stroke Neg Hx     DRUG ALLERGIES:  No Known Allergies  REVIEW OF SYSTEMS:  CONSTITUTIONAL: No fever, fatigue or weakness.  EYES: No blurred or double vision.  EARS, NOSE, AND THROAT: No tinnitus or ear pain.  RESPIRATORY: Cough, shortness of breath, wheezing CARDIOVASCULAR: No chest pain, orthopnea, edema.  GASTROINTESTINAL: No nausea, vomiting, diarrhea or abdominal pain.  GENITOURINARY: No dysuria, hematuria.  ENDOCRINE: No polyuria, nocturia,  HEMATOLOGY: No anemia, easy bruising or bleeding SKIN: No rash or lesion. MUSCULOSKELETAL: No joint pain or arthritis.   NEUROLOGIC: No tingling, numbness, weakness.  PSYCHIATRY: No anxiety or depression.   MEDICATIONS AT HOME:   Prior to Admission medications   Medication Sig Start Date End Date Taking? Authorizing Provider  ADVAIR HFA 115-21 MCG/ACT inhaler Inhale 2 puffs into the lungs 2 (two) times daily.  10/11/14   Historical Provider, MD  albuterol (PROVENTIL HFA;VENTOLIN HFA) 108 (90 BASE)  MCG/ACT inhaler Inhale 2 puffs into the lungs every 6 (six) hours as needed for wheezing or shortness of breath.     Historical Provider, MD  albuterol (PROVENTIL) (2.5 MG/3ML) 0.083% nebulizer solution Take 3 mLs (2.5 mg total) by nebulization every 4 (four) hours as needed for wheezing or shortness of breath. 10/15/14   Kerman Passey, MD  Apremilast (OTEZLA) 30 MG TABS 2 (two) times daily. 10/05/15   Historical Provider, MD  aspirin EC 81 MG tablet Take 81 mg by mouth daily. Reported on 06/27/2015    Historical Provider, MD  Cetirizine HCl (ZYRTEC ALLERGY) 10 MG CAPS Take 10 mg by mouth daily.    Historical Provider, MD  Clobetasol Propionate 0.05 % shampoo Apply 1  application topically as needed.  03/31/15   Historical Provider, MD  CORDRAN 0.05 % lotion Apply 1 application topically 2 (two) times daily. Reported on 04/04/2015 03/09/15   Historical Provider, MD  Desoximetasone (TOPICORT SPRAY EX) Apply 1 application topically 2 (two) times daily as needed (scorisis/irritation over body. Use all over except for on face.). Reported on 04/04/2015    Historical Provider, MD  escitalopram (LEXAPRO) 5 MG tablet Take 1 tablet (5 mg total) by mouth daily. 12/08/15   Megan P Johnson, DO  esomeprazole (NEXIUM) 20 MG capsule Take 20 mg by mouth daily at 12 noon.    Historical Provider, MD  fluticasone (FLONASE) 50 MCG/ACT nasal spray Place 1 spray into both nostrils daily.  07/23/14   Historical Provider, MD  gabapentin (NEURONTIN) 300 MG capsule Take 300 mg by mouth 2 (two) times daily. 03/22/15   Historical Provider, MD  ketoconazole (NIZORAL) 2 % cream 1 APPLICATION APPLY ON THE SKIN DAILY 03/30/15   Historical Provider, MD  LYRICA 75 MG capsule 2 (two) times daily. 11/15/15   Historical Provider, MD  mirtazapine (REMERON) 15 MG tablet Take 1 tablet (15 mg total) by mouth at bedtime. 12/08/15   Megan P Johnson, DO  mometasone (ELOCON) 0.1 % ointment Apply 1 application topically daily as needed (for ear irritation/eczema in ear). Apply to each ear 1 drop daily as needed    Historical Provider, MD  Multiple Vitamin (MULTIVITAMIN) tablet Take 1 tablet by mouth daily.    Historical Provider, MD  nystatin (MYCOSTATIN) 100000 UNIT/ML suspension Take 5 mLs by mouth 4 (four) times daily as needed. thrush 02/08/15   Historical Provider, MD  rifaximin (XIFAXAN) 550 MG TABS tablet Take 1 tablet (550 mg total) by mouth 2 (two) times daily. 12/08/15   Megan P Johnson, DO  spironolactone (ALDACTONE) 25 MG tablet Take 1 tablet (25 mg total) by mouth daily. 12/08/15   Megan P Johnson, DO  theophylline (THEODUR) 300 MG 12 hr tablet Take 300 mg by mouth 2 (two) times daily. 03/02/15   Historical  Provider, MD  tiotropium (SPIRIVA HANDIHALER) 18 MCG inhalation capsule Place 1 capsule (18 mcg total) into inhaler and inhale daily. 10/19/14   Srikar Sudini, MD  traMADol (ULTRAM) 50 MG tablet 2 (two) times daily. 12/07/15   Historical Provider, MD      VITAL SIGNS:  Blood pressure 140/84, pulse (!) 111, temperature 97.7 F (36.5 C), temperature source Oral, resp. rate (!) 23, height 5\' 10"  (1.778 m), weight 77.1 kg (170 lb), SpO2 92 %.  PHYSICAL EXAMINATION:  GENERAL:  60 y.o.-year-old patient lying in the bed with no acute distress.  EYES: Pupils equal, round, reactive to light and accommodation. No scleral icterus. Extraocular muscles intact.  HEENT: Head atraumatic,  normocephalic. Oropharynx and nasopharynx clear.  NECK:  Supple, no jugular venous distention. No thyroid enlargement, no tenderness.  LUNGS: Bilateral expiratory wheezing in all lung fields.  CARDIOVASCULAR: S1, S2 tachycardic.Marland Kitchen. No murmurs, rubs, or gallops.  ABDOMEN: Soft, nontender, nondistended. Bowel sounds present. No organomegaly or mass.  EXTREMITIES: No pedal edema, cyanosis, or clubbing.  NEUROLOGIC: Cranial nerves II through XII are intact. Muscle strength 5/5 in all extremities. Sensation intact. Gait not checked.  PSYCHIATRIC: The patient is alert and oriented x 3.  SKIN: No obvious rash, lesion, or ulcer.   LABORATORY PANEL:   CBC  Recent Labs Lab 01/15/16 1427  WBC 11.6*  HGB 15.5  HCT 45.7  PLT 196   ------------------------------------------------------------------------------------------------------------------  Chemistries   Recent Labs Lab 01/15/16 1427  NA 140  K 4.0  CL 98*  CO2 34*  GLUCOSE 120*  BUN 8  CREATININE 0.62  CALCIUM 10.2   ------------------------------------------------------------------------------------------------------------------  Cardiac Enzymes  Recent Labs Lab 01/15/16 1803  TROPONINI <0.03    ------------------------------------------------------------------------------------------------------------------  RADIOLOGY:  Dg Chest 2 View  Result Date: 01/15/2016 CLINICAL DATA:  Chronic shortness of breath. Smoker. History of COPD. EXAM: CHEST  2 VIEW COMPARISON:  10/05/2015 FINDINGS: The heart size appears normal. Aortic atherosclerosis identified. Lungs are hyperinflated with coarsened interstitial markings noted bilaterally. No superimposed airspace consolidation identified. IMPRESSION: No active cardiopulmonary disease. Electronically Signed   By: Signa Kellaylor  Stroud M.D.   On: 01/15/2016 15:21   Ct Angio Chest Pe W And/or Wo Contrast  Result Date: 01/15/2016 CLINICAL DATA:  Progressive worsening of shortness of breath for 3 weeks. History of COPD. Patient reports increased oxygen requirements. EXAM: CT ANGIOGRAPHY CHEST WITH CONTRAST TECHNIQUE: Multidetector CT imaging of the chest was performed using the standard protocol during bolus administration of intravenous contrast. Multiplanar CT image reconstructions and MIPs were obtained to evaluate the vascular anatomy. CONTRAST:  75 cc Isovue 370. COMPARISON:  Chest radiograph 01/15/2016 FINDINGS: Cardiovascular: Satisfactory opacification of the pulmonary arteries to the segmental level. No evidence of pulmonary embolism. Normal heart size. No pericardial effusion. Mild calcific atherosclerotic disease of the coronary arteries and aorta. Mediastinum/Nodes: No enlarged mediastinal, hilar, or axillary lymph nodes. Thyroid gland, trachea, and esophagus demonstrate no significant findings. Lungs/Pleura: Moderate in severity upper lobe predominant paraseptal emphysema. 5 mm right apical pulmonary nodule versus scarring. Atelectatic changes and the lingula and right middle lobe. Upper Abdomen: No acute abnormality. Granulomatous calcifications in the spleen. Musculoskeletal: No chest wall abnormality. No acute or significant osseous findings. Review  of the MIP images confirms the above findings. IMPRESSION: No evidence of pulmonary embolus or other vascular abnormality. Moderate in severity upper lobe predominant emphysematous changes consistent with the given history of COPD. 5 mm right apical sub solid pulmonary nodule. No follow-up recommended. Non-contrast chest CT can be considered in 12 months if patient is high-risk. This recommendation follows the consensus statement: Guidelines for Management of Incidental Pulmonary Nodules Detected on CT Images: From the Fleischner Society 2017; Radiology 2017; 284:228-243. Electronically Signed   By: Ted Mcalpineobrinka  Dimitrova M.D.   On: 01/15/2016 19:01    EKG:   Orders placed or performed during the hospital encounter of 01/15/16  . ED EKG  . ED EKG  . EKG 12-Lead  . EKG 12-Lead  Sinus tachycardia at 1 13 bpm, no  ST-T changes.,  IMPRESSION AND PLAN:   #1. Acute on chronic respiratory failure secondary to COPD exacerbation: Needing continuous oxygen.; Admit to hospitalist service, continue oxygen 2-3 L to keep sats more  than 90%, continue xopenox nebs , continue IV steroids, family is requesting Dr. Fleming to see the patient, pulmonary consult is requested. #2 heavy tobacco Meredeth Ideabuse patient continue to smoke one and half pack of cigarettes a day: Advised to quit, offered assistance, start nicotine patch. Counseled for 10 minutes. #3. Generalized anxiety disorder: Usual medications #4 essential hypertension, tachycardia 5.severe COPD; patient saw Dr. Meredeth IdeFleming on October 24. Reconsult Dr. Meredeth IdeFleming and finished Zithromax and, prednisone. All the records are reviewed and case discussed with ED provider. Management plans discussed with the patient, family and they are in agreement.  CODE STATUS:full  TOTAL TIME TAKING CARE OF THIS PATIENT: 55minutes.    Katha HammingKONIDENA,Waynette Towers M.D on 01/15/2016 at 8:29 PM  Between 7am to 6pm - Pager - [phone]  After 6pm go to [email or web address] - password [email or web address]  [facility]  Office  [phone]  CC: Primary care physician; Olevia PerchesMegan Johnson, DO  Note: This dictation was prepared with Dragon dictation along with smaller phrase technology. Any transcriptional errors that result from this process are unintentional.

## 2016-01-15 NOTE — ED Notes (Signed)
Patient transported to radiology

## 2016-01-16 LAB — MAGNESIUM: MAGNESIUM: 1.8 mg/dL (ref 1.7–2.4)

## 2016-01-16 LAB — BASIC METABOLIC PANEL
Anion gap: 6 (ref 5–15)
BUN: 11 mg/dL (ref 6–20)
CALCIUM: 9.4 mg/dL (ref 8.9–10.3)
CO2: 30 mmol/L (ref 22–32)
CREATININE: 0.58 mg/dL — AB (ref 0.61–1.24)
Chloride: 103 mmol/L (ref 101–111)
GFR calc Af Amer: >60 mL/min (ref >60)
GLUCOSE: 150 mg/dL — AB (ref 65–99)
Potassium: 4.7 mmol/L (ref 3.5–5.1)
SODIUM: 139 mmol/L (ref 135–145)

## 2016-01-16 LAB — CBC
HCT: 44.5 % (ref 40.0–52.0)
Hemoglobin: 14.8 g/dL (ref 13.0–18.0)
MCH: 31.8 pg (ref 26.0–34.0)
MCHC: 33.1 g/dL (ref 32.0–36.0)
MCV: 96 fL (ref 80.0–100.0)
PLATELETS: 187 10*3/uL (ref 150–440)
RBC: 4.64 MIL/uL (ref 4.40–5.90)
RDW: 14.5 % (ref 11.5–14.5)
WBC: 12.4 10*3/uL — ABNORMAL HIGH (ref 3.8–10.6)

## 2016-01-16 LAB — GLUCOSE, CAPILLARY: Glucose-Capillary: 141 mg/dL — ABNORMAL HIGH (ref 65–99)

## 2016-01-16 MED ORDER — BUDESONIDE 0.25 MG/2ML IN SUSP
0.2500 mg | Freq: Two times a day (BID) | RESPIRATORY_TRACT | Status: DC
  Administered 2016-01-17: 0.25 mg via RESPIRATORY_TRACT
  Filled 2016-01-16 (×2): qty 2

## 2016-01-16 MED ORDER — TRAMADOL HCL 50 MG PO TABS
50.0000 mg | ORAL_TABLET | Freq: Two times a day (BID) | ORAL | Status: DC
  Administered 2016-01-16 – 2016-01-17 (×3): 50 mg via ORAL
  Filled 2016-01-16 (×3): qty 1

## 2016-01-16 MED ORDER — METOPROLOL SUCCINATE ER 25 MG PO TB24
25.0000 mg | ORAL_TABLET | Freq: Every day | ORAL | Status: DC
  Administered 2016-01-16 – 2016-01-17 (×2): 25 mg via ORAL
  Filled 2016-01-16 (×2): qty 1

## 2016-01-16 MED ORDER — BUDESONIDE 0.25 MG/2ML IN SUSP: 0.2500 mg | Freq: Two times a day (BID) | RESPIRATORY_TRACT | Status: DC

## 2016-01-16 MED ORDER — LEVALBUTEROL HCL 0.63 MG/3ML IN NEBU
0.6300 mg | INHALATION_SOLUTION | Freq: Four times a day (QID) | RESPIRATORY_TRACT | Status: DC
Start: 2016-01-16 — End: 2016-01-17
  Administered 2016-01-16 – 2016-01-17 (×3): 0.63 mg via RESPIRATORY_TRACT
  Filled 2016-01-16 (×3): qty 3

## 2016-01-16 NOTE — Progress Notes (Signed)
Pt. Slept well throughout the night with no c/o pain, SOB or acute distress noted. Pt. Ran sinus tach in lower 100's - teens. IV fluids continues.

## 2016-01-16 NOTE — Progress Notes (Signed)
[facility] - [place] at [facility]   PATIENT NAME: Devin Bryant    MR#:  [id number]  DATE OF BIRTH:  [birth year]  SUBJECTIVE:  CHIEF COMPLAINT:   Chief Complaint  Patient presents with  . Shortness of Breath  The patient is 60 year old Caucasian male with past medical history significant for history of COPD, intermittent use of oxygen at home, who presents to the hospital with complaints of 2 week history of cough, wheezing, phlegm production. Heparin. The patient was seen by his primary pulmonologist on October 24, was prescribed azithromycin and prednisone taper, he finished therapy and continues to have wheezing and shortness of breath even with minimal exertion. Requires continuous use of 2-3 L of oxygen through nasal cannula. On arrival to the hospital with chest x-ray was unremarkable as well as a CTA of the chest, but emphysema. The patient was admitted to the hospital for COPD exacerbation. He feels much more comfortable today, denies any significant sputum production. Less wheezing. 10 beat V. tach episode today   Review of Systems  Constitutional: Negative for chills, fever and weight loss.  HENT: Negative for congestion.   Eyes: Negative for blurred vision and double vision.  Respiratory: Positive for cough, shortness of breath and wheezing. Negative for sputum production.   Cardiovascular: Negative for chest pain, palpitations, orthopnea, leg swelling and PND.  Gastrointestinal: Negative for abdominal pain, blood in stool, constipation, diarrhea, nausea and vomiting.  Genitourinary: Negative for dysuria, frequency, hematuria and urgency.  Musculoskeletal: Negative for falls.  Neurological: Negative for dizziness, tremors, focal weakness and headaches.  Endo/Heme/Allergies: Does not bruise/bleed easily.  Psychiatric/Behavioral: Negative for depression. The patient does not have insomnia.     VITAL SIGNS: Blood pressure 112/73, pulse (!) 109,  temperature 98.6 F (37 C), temperature source Oral, resp. rate 20, height 5\' 10"  (1.778 m), weight 78.2 kg (172 lb 7 oz), SpO2 96 %.  PHYSICAL EXAMINATION:   GENERAL:  60 y.o.-year-old patient lying in the bed with no acute distress.  EYES: Pupils equal, round, reactive to light and accommodation. No scleral icterus. Extraocular muscles intact.  HEENT: Head atraumatic, normocephalic. Oropharynx and nasopharynx clear.  NECK:  Supple, no jugular venous distention. No thyroid enlargement, no tenderness.  LUNGS: Normal breath sounds bilaterally, anteriorly, no wheezing, rales,rhonchi or crepitation, wheezing and tightness to auscultation and was referred in upper lung fields posteriorly. No use of accessory muscles of respiration. Intermittent cough CARDIOVASCULAR: S1, S2 normal. No murmurs, rubs, or gallops.  ABDOMEN: Soft, nontender, nondistended. Bowel sounds present. No organomegaly or mass.  EXTREMITIES: No pedal edema, cyanosis, or clubbing.  NEUROLOGIC: Cranial nerves II through XII are intact. Muscle strength 5/5 in all extremities. Sensation intact. Gait not checked.  PSYCHIATRIC: The patient is alert and oriented x 3.  SKIN: No obvious rash, lesion, or ulcer.   ORDERS/RESULTS REVIEWED:   CBC  Recent Labs Lab 01/15/16 1427 01/16/16 0514  WBC 11.6* 12.4*  HGB 15.5 14.8  HCT 45.7 44.5  PLT 196 187  MCV 95.3 96.0  MCH 32.3 31.8  MCHC 33.9 33.1  RDW 14.2 14.5   ------------------------------------------------------------------------------------------------------------------  Chemistries   Recent Labs Lab 01/15/16 1427 01/16/16 0514  NA 140 139  K 4.0 4.7  CL 98* 103  CO2 34* 30  GLUCOSE 120* 150*  BUN 8 11  CREATININE 0.62 0.58*  CALCIUM 10.2 9.4  MG  --  1.8   ------------------------------------------------------------------------------------------------------------------ estimated creatinine clearance is 98.9 mL/min (by C-G formula based on SCr of 0.58  mg/dL  (L)). ------------------------------------------------------------------------------------------------------------------ No results for input(s): TSH, T4TOTAL, T3FREE, THYROIDAB in the last 72 hours.  Invalid input(s): FREET3  Cardiac Enzymes  Recent Labs Lab 01/15/16 1427 01/15/16 1803  TROPONINI <0.03 <0.03   ------------------------------------------------------------------------------------------------------------------ Invalid input(s): POCBNP ---------------------------------------------------------------------------------------------------------------  RADIOLOGY: Dg Chest 2 View  Result Date: 01/15/2016 CLINICAL DATA:  Chronic shortness of breath. Smoker. History of COPD. EXAM: CHEST  2 VIEW COMPARISON:  10/05/2015 FINDINGS: The heart size appears normal. Aortic atherosclerosis identified. Lungs are hyperinflated with coarsened interstitial markings noted bilaterally. No superimposed airspace consolidation identified. IMPRESSION: No active cardiopulmonary disease. Electronically Signed   By: Signa Kellaylor  Stroud M.D.   On: 01/15/2016 15:21   Ct Angio Chest Pe W And/or Wo Contrast  Result Date: 01/15/2016 CLINICAL DATA:  Progressive worsening of shortness of breath for 3 weeks. History of COPD. Patient reports increased oxygen requirements. EXAM: CT ANGIOGRAPHY CHEST WITH CONTRAST TECHNIQUE: Multidetector CT imaging of the chest was performed using the standard protocol during bolus administration of intravenous contrast. Multiplanar CT image reconstructions and MIPs were obtained to evaluate the vascular anatomy. CONTRAST:  75 cc Isovue 370. COMPARISON:  Chest radiograph 01/15/2016 FINDINGS: Cardiovascular: Satisfactory opacification of the pulmonary arteries to the segmental level. No evidence of pulmonary embolism. Normal heart size. No pericardial effusion. Mild calcific atherosclerotic disease of the coronary arteries and aorta. Mediastinum/Nodes: No enlarged mediastinal, hilar, or  axillary lymph nodes. Thyroid gland, trachea, and esophagus demonstrate no significant findings. Lungs/Pleura: Moderate in severity upper lobe predominant paraseptal emphysema. 5 mm right apical pulmonary nodule versus scarring. Atelectatic changes and the lingula and right middle lobe. Upper Abdomen: No acute abnormality. Granulomatous calcifications in the spleen. Musculoskeletal: No chest wall abnormality. No acute or significant osseous findings. Review of the MIP images confirms the above findings. IMPRESSION: No evidence of pulmonary embolus or other vascular abnormality. Moderate in severity upper lobe predominant emphysematous changes consistent with the given history of COPD. 5 mm right apical sub solid pulmonary nodule. No follow-up recommended. Non-contrast chest CT can be considered in 12 months if patient is high-risk. This recommendation follows the consensus statement: Guidelines for Management of Incidental Pulmonary Nodules Detected on CT Images: From the Fleischner Society 2017; Radiology 2017; 284:228-243. Electronically Signed   By: Ted Mcalpineobrinka  Dimitrova M.D.   On: 01/15/2016 19:01    EKG:  Orders placed or performed during the hospital encounter of 01/15/16  . ED EKG  . ED EKG  . EKG 12-Lead  . EKG 12-Lead    ASSESSMENT AND PLAN:  Active Problems:   Acute on chronic respiratory failure (HCC) #1. Acute on chronic respiratory failure with hypoxia  and likely hypercapnia due to COPD exacerbation, acute bronchitis, continue steroids, inhalation therapy, add budesonide, patient has improved clinically, continue oxygen and wean off oxygen as tolerated #2V. tach episode, nonsustained, initiate patient on metoprolol, magnesium level was normal, as well as potassium, possibly related to COPD exacerbation #3. Leukocytosis, likely stress as well as steroid related, follow closely #4. History of tobacco abuse, patient continues to smoke, counseled on admission, continue supportive  therapy #5. Generalized anxiety disorder, continue home medications #6. History of alcohol abuse, patient quit about 3-4 months ago, patient was congratulated and encouraged to continue the same   Management plans discussed with the patient, family and they are in agreement.   DRUG ALLERGIES: No Known Allergies  CODE STATUS:     Code Status Orders        Start     Ordered   01/15/16 2027  Full code  Continuous     01/15/16 2029    Code Status History    Date Active Date Inactive Code Status Order ID Comments User Context   10/05/2015  3:13 PM 10/08/2015  4:53 PM Full Code [id number]  Alford Highlandichard Wieting, MD ED   06/27/2015  2:36 PM 06/29/2015  2:34 PM Full Code [id number]  Katha HammingSnehalatha Konidena, MD ED   03/11/2015  5:29 PM 03/14/2015  3:59 PM Full Code [id number]  Dorothea OgleIskra M Myers, MD Inpatient   10/17/2014  2:31 PM 10/19/2014  2:34 PM Full Code [id number]  Shaune PollackQing Chen, MD Inpatient    Advance Directive Documentation   Flowsheet Row Most Recent Value  Type of Advance Directive  Living will [requested copy]  Pre-existing out of facility DNR order (yellow form or pink MOST form)  No data  "MOST" Form in Place?  No data      TOTAL TIME TAKING CARE OF THIS PATIENT: 35  minutes.    Katharina CaperVAICKUTE,Tomisha Reppucci M.D on 01/16/2016 at 12:33 PM  Between 7am to 6pm - Pager - [phone]  After 6pm go to [email or web address] - password [email or web address] [email or web address]  [facility]  Office  [phone]  CC: Primary care physician; Olevia PerchesMegan Johnson, DO

## 2016-01-16 NOTE — Progress Notes (Signed)
Dr. Winona LegatoVaickute notified of 10 beats VT, asymptomatic. MD to place orders as needed.

## 2016-01-16 NOTE — Plan of Care (Signed)
Problem: Safety: Goal: Ability to remain free from injury will improve Outcome: Progressing Patient safely ambulating independently in room

## 2016-01-16 NOTE — Care Management (Signed)
Chronic oxygen through [employer] and has home nebulizer machine.  Prior to this episode of illness Independent in all adls, denies issues accessing medical care, obtaining medications or with transportation.  [facility]  No discharge needs identified at present by care manager or members of care team

## 2016-01-17 DIAGNOSIS — I4729 Other ventricular tachycardia: Secondary | ICD-10-CM

## 2016-01-17 DIAGNOSIS — F1010 Alcohol abuse, uncomplicated: Secondary | ICD-10-CM

## 2016-01-17 DIAGNOSIS — J9621 Acute and chronic respiratory failure with hypoxia: Secondary | ICD-10-CM

## 2016-01-17 DIAGNOSIS — Z87891 Personal history of nicotine dependence: Secondary | ICD-10-CM

## 2016-01-17 DIAGNOSIS — I472 Ventricular tachycardia: Secondary | ICD-10-CM

## 2016-01-17 DIAGNOSIS — D72829 Elevated white blood cell count, unspecified: Secondary | ICD-10-CM

## 2016-01-17 DIAGNOSIS — J9622 Acute and chronic respiratory failure with hypercapnia: Secondary | ICD-10-CM

## 2016-01-17 HISTORY — DX: Alcohol abuse, uncomplicated: F10.10

## 2016-01-17 MED ORDER — ASPIRIN 81 MG PO TBEC: 81.0000 mg | DELAYED_RELEASE_TABLET | Freq: Every day | ORAL | 6 refills | Status: DC

## 2016-01-17 MED ORDER — METOPROLOL SUCCINATE ER 25 MG PO TB24: 25.0000 mg | ORAL_TABLET | Freq: Every day | ORAL | 6 refills | Status: DC

## 2016-01-17 MED ORDER — HYDROCORTISONE VALERATE 0.2 % EX OINT: TOPICAL_OINTMENT | Freq: Every day | CUTANEOUS | 0 refills | Status: DC | PRN

## 2016-01-17 MED ORDER — GABAPENTIN 300 MG PO CAPS: 300.0000 mg | ORAL_CAPSULE | Freq: Two times a day (BID) | ORAL | 6 refills | Status: DC

## 2016-01-17 MED ORDER — PREDNISONE 10 MG (21) PO TBPK: 10.0000 mg | ORAL_TABLET | Freq: Every day | ORAL | 0 refills | Status: DC

## 2016-01-17 NOTE — Care Management (Signed)
No discharge needs. 

## 2016-01-17 NOTE — Discharge Summary (Signed)
[facility] [place] [facility]   PATIENT NAME: Devin HusbandsDavid Bryant    MR#:  [id number]  DATE OF BIRTH:  17-Oct-1955  DATE OF ADMISSION:  01/15/2016 ADMITTING PHYSICIAN: Katha HammingSnehalatha Konidena, MD  DATE OF DISCHARGE: No discharge date for patient encounter.  PRIMARY CARE PHYSICIAN: Olevia PerchesMegan Johnson, DO     ADMISSION DIAGNOSIS:  Acute respiratory failure with hypoxia (HCC) [J96.01]  DISCHARGE DIAGNOSIS:  Active Problems:   Acute on chronic respiratory failure (HCC)   Acute on chronic respiratory failure with hypoxia and hypercapnia (HCC)   Nonsustained ventricular tachycardia (HCC)   Leukocytosis   History of tobacco abuse   History of alcohol abuse   SECONDARY DIAGNOSIS:   Past Medical History:  Diagnosis Date  . Adjustment disorder with depressed mood   . Allergy   . Anxiety   . COPD (chronic obstructive pulmonary disease) (HCC)   . ED (erectile dysfunction)   . Elevated glucose   . Elevated serum glutamic pyruvic transaminase (SGPT) level   . Emphysema lung (HCC)   . GERD (gastroesophageal reflux disease)   . Hypertension   . Hypertriglyceridemia   . Liver disease   . Neuropathy (HCC)   . Tobacco use     .pro HOSPITAL COURSE:  The patient is 60 year old Caucasian male with past medical history significant for history of COPD, intermittent use of oxygen at home, who presents to the hospital with complaints of 2 week history of cough, wheezing, phlegm production. Heparin. The patient was seen by his primary pulmonologist on October 24, was prescribed azithromycin and prednisone taper, he finished therapy , but continued to have wheezing and shortness of breath even with minimal exertion. Required 2-3 L of oxygen through nasal cannula continuously. On arrival to the hospital his chest x-ray was unremarkable as well as a CTA of the chest, but emphysema. The patient was admitted to the hospital for COPD exacerbation. He was initiated on steroids,  inhalation therapy and clinically improved and. He was noted to have 10 beat V. tach episode , felt to be due to COPD exacerbation. He was initiated on Toprol-XL and the history V. tach, resolved, he remained in sinus rhythm on the day of discharge, asymptomatic. No echocardiogram was performed since he had most recent echocardiogram, normal in August 2017. Discussion by problem: #1. Acute on chronic respiratory failure with hypoxia  and likely hypercapnia due to COPD exacerbation, continue steroid taper, inhalation therapy, patient has improved clinically, continue oxygen and wean off oxygen as tolerated, follow-up with pulmonologist as outpatient #2V. tach episode, nonsustained, continue patient on metoprolol, magnesium level was normal, as well as potassium, was suspected related to COPD exacerbation/betamimetics #3. Leukocytosis, likely stress as well as steroid related, follow closely as outpatient #4. History of tobacco abuse, patient continues to smoke, counseled on admission, continue supportive therapy #5. Generalized anxiety disorder, continue home medications #6. History of alcohol abuse, patient quit about 3-4 months ago, patient was congratulated and encouraged to continue abstinence DISCHARGE CONDITIONS:   Stable  CONSULTS OBTAINED:    DRUG ALLERGIES:  No Known Allergies  DISCHARGE MEDICATIONS:   Current Discharge Medication List    START taking these medications   Details  hydrocortisone valerate ointment (WEST-CORT) 0.2 % Apply topically daily as needed (for ear irritation/eczema). Qty: 45 g, Refills: 0    metoprolol succinate (TOPROL-XL) 25 MG 24 hr tablet Take 1 tablet (25 mg total) by mouth daily. Qty: 30 tablet, Refills: 6    predniSONE (STERAPRED UNI-PAK 21 TAB)  10 MG (21) TBPK tablet Take 1 tablet (10 mg total) by mouth daily. Please take 6 pills in the morning on the day 1 and 2, then 5 pills in the morning on the day 3 and 4, then taper by one pill every 2 days  until finished, thank you Qty: 42 tablet, Refills: 0      CONTINUE these medications which have CHANGED   Details  aspirin EC 81 MG EC tablet Take 1 tablet (81 mg total) by mouth daily. Qty: 30 tablet, Refills: 6    gabapentin (NEURONTIN) 300 MG capsule Take 1 capsule (300 mg total) by mouth 2 (two) times daily. Qty: 60 capsule, Refills: 6      CONTINUE these medications which have NOT CHANGED   Details  ADVAIR HFA 115-21 MCG/ACT inhaler Inhale 2 puffs into the lungs 2 (two) times daily.     albuterol (PROVENTIL HFA;VENTOLIN HFA) 108 (90 BASE) MCG/ACT inhaler Inhale 2 puffs into the lungs every 6 (six) hours as needed for wheezing or shortness of breath.     albuterol (PROVENTIL) (2.5 MG/3ML) 0.083% nebulizer solution Take 3 mLs (2.5 mg total) by nebulization every 4 (four) hours as needed for wheezing or shortness of breath. Qty: 50 mL, Refills: 3    Cetirizine HCl (ZYRTEC ALLERGY) 10 MG CAPS Take 10 mg by mouth daily.    escitalopram (LEXAPRO) 5 MG tablet Take 1 tablet (5 mg total) by mouth daily. Qty: 30 tablet, Refills: 3    esomeprazole (NEXIUM) 20 MG capsule Take 20 mg by mouth daily at 12 noon.    fluticasone (FLONASE) 50 MCG/ACT nasal spray Place 1 spray into both nostrils daily.     LYRICA 75 MG capsule Take 75 mg by mouth 2 (two) times daily.     mirtazapine (REMERON) 15 MG tablet Take 1 tablet (15 mg total) by mouth at bedtime. Qty: 30 tablet, Refills: 0    Multiple Vitamin (MULTIVITAMIN) tablet Take 1 tablet by mouth daily.    nystatin (MYCOSTATIN) 100000 UNIT/ML suspension Take 5 mLs by mouth 4 (four) times daily as needed. thrush Refills: 13    rifaximin (XIFAXAN) 550 MG TABS tablet Take 1 tablet (550 mg total) by mouth 2 (two) times daily. Qty: 60 tablet, Refills: 0    spironolactone (ALDACTONE) 25 MG tablet Take 1 tablet (25 mg total) by mouth daily. Qty: 30 tablet, Refills: 0    theophylline (THEODUR) 300 MG 12 hr tablet Take 300 mg by mouth 2 (two)  times daily. Refills: 11    tiotropium (SPIRIVA HANDIHALER) 18 MCG inhalation capsule Place 1 capsule (18 mcg total) into inhaler and inhale daily. Qty: 30 capsule, Refills: 0    traMADol (ULTRAM) 50 MG tablet Take 50 mg by mouth 2 (two) times daily.          DISCHARGE INSTRUCTIONS:    The patient is to follow-up with primary care physician, Primary pulmonologist within 1 week after discharge  If you experience worsening of your admission symptoms, develop shortness of breath, life threatening emergency, suicidal or homicidal thoughts you must seek medical attention immediately by calling 911 or calling your MD immediately  if symptoms less severe.  You Must read complete instructions/literature along with all the possible adverse reactions/side effects for all the Medicines you take and that have been prescribed to you. Take any new Medicines after you have completely understood and accept all the possible adverse reactions/side effects.   Please note  You were cared for by a hospitalist  during your hospital stay. If you have any questions about your discharge medications or the care you received while you were in the hospital after you are discharged, you can call the unit and asked to speak with the hospitalist on call if the hospitalist that took care of you is not available. Once you are discharged, your primary care physician will handle any further medical issues. Please note that NO REFILLS for any discharge medications will be authorized once you are discharged, as it is imperative that you return to your primary care physician (or establish a relationship with a primary care physician if you do not have one) for your aftercare needs so that they can reassess your need for medications and monitor your lab values.    Today   CHIEF COMPLAINT:   Chief Complaint  Patient presents with  . Shortness of Breath    HISTORY OF PRESENT ILLNESS:  Devin Bryant  is a 60 y.o. male  with a known history of COPD, intermittent use of oxygen at home, who presents to the hospital with complaints of 2 week history of cough, wheezing, phlegm production. Heparin. The patient was seen by his primary pulmonologist on October 24, was prescribed azithromycin and prednisone taper, he finished therapy , but continued to have wheezing and shortness of breath even with minimal exertion. Required 2-3 L of oxygen through nasal cannula continuously. On arrival to the hospital his chest x-ray was unremarkable as well as a CTA of the chest, but emphysema. The patient was admitted to the hospital for COPD exacerbation. He was initiated on steroids, inhalation therapy and clinically improved and. He was noted to have 10 beat V. tach episode , felt to be due to COPD exacerbation. He was initiated on Toprol-XL and the history V. tach, resolved, he remained in sinus rhythm on the day of discharge, asymptomatic. No echocardiogram was performed since he had most recent echocardiogram, normal in August 2017. Discussion by problem: #1. Acute on chronic respiratory failure with hypoxia  and likely hypercapnia due to COPD exacerbation, continue steroid taper, inhalation therapy, patient has improved clinically, continue oxygen and wean off oxygen as tolerated, follow-up with pulmonologist as outpatient #2V. tach episode, nonsustained, continue patient on metoprolol, magnesium level was normal, as well as potassium, was suspected related to COPD exacerbation/betamimetics #3. Leukocytosis, likely stress as well as steroid related, follow closely as outpatient #4. History of tobacco abuse, patient continues to smoke, counseled on admission, continue supportive therapy #5. Generalized anxiety disorder, continue home medications #6. History of alcohol abuse, patient quit about 3-4 months ago, patient was congratulated and encouraged to continue abstinence    VITAL SIGNS:  Blood pressure 111/80, pulse 99, temperature  97.9 F (36.6 C), temperature source Oral, resp. rate 18, height 5\' 10"  (1.778 m), weight 77.3 kg (170 lb 6.4 oz), SpO2 96 %.  I/O:   Intake/Output Summary (Last 24 hours) at 01/17/16 1225 Last data filed at 01/17/16 0935  Gross per 24 hour  Intake           909.17 ml  Output             2575 ml  Net         -1665.83 ml    PHYSICAL EXAMINATION:  GENERAL:  60 y.o.-year-old patient lying in the bed with no acute distress.  EYES: Pupils equal, round, reactive to light and accommodation. No scleral icterus. Extraocular muscles intact.  HEENT: Head atraumatic, normocephalic. Oropharynx and nasopharynx clear.  NECK:  Supple, no jugular venous distention. No thyroid enlargement, no tenderness.  LUNGS: Normal breath sounds bilaterally, no wheezing, rales,rhonchi or crepitation. No use of accessory muscles of respiration.  CARDIOVASCULAR: S1, S2 normal. No murmurs, rubs, or gallops.  ABDOMEN: Soft, non-tender, non-distended. Bowel sounds present. No organomegaly or mass.  EXTREMITIES: No pedal edema, cyanosis, or clubbing.  NEUROLOGIC: Cranial nerves II through XII are intact. Muscle strength 5/5 in all extremities. Sensation intact. Gait not checked.  PSYCHIATRIC: The patient is alert and oriented x 3.  SKIN: No obvious rash, lesion, or ulcer.   DATA REVIEW:   CBC  Recent Labs Lab 01/16/16 0514  WBC 12.4*  HGB 14.8  HCT 44.5  PLT 187    Chemistries   Recent Labs Lab 01/16/16 0514  NA 139  K 4.7  CL 103  CO2 30  GLUCOSE 150*  BUN 11  CREATININE 0.58*  CALCIUM 9.4  MG 1.8    Cardiac Enzymes  Recent Labs Lab 01/15/16 1803  TROPONINI <0.03    Microbiology Results  Results for orders placed or performed in visit on 12/08/15  Microscopic Examination     Status: None   Collection Time: 12/08/15  2:06 PM  Result Value Ref Range Status   WBC, UA None seen 0 - 5 /hpf Final   RBC, UA None seen 0 - 2 /hpf Final   Epithelial Cells (non renal) 0-10 0 - 10 /hpf Final    Casts Present None seen /lpf Final   Cast Type Hyaline casts N/A Final    RADIOLOGY:  Dg Chest 2 View  Result Date: 01/15/2016 CLINICAL DATA:  Chronic shortness of breath. Smoker. History of COPD. EXAM: CHEST  2 VIEW COMPARISON:  10/05/2015 FINDINGS: The heart size appears normal. Aortic atherosclerosis identified. Lungs are hyperinflated with coarsened interstitial markings noted bilaterally. No superimposed airspace consolidation identified. IMPRESSION: No active cardiopulmonary disease. Electronically Signed   By: Signa Kell M.D.   On: 01/15/2016 15:21   Ct Angio Chest Pe W And/or Wo Contrast  Result Date: 01/15/2016 CLINICAL DATA:  Progressive worsening of shortness of breath for 3 weeks. History of COPD. Patient reports increased oxygen requirements. EXAM: CT ANGIOGRAPHY CHEST WITH CONTRAST TECHNIQUE: Multidetector CT imaging of the chest was performed using the standard protocol during bolus administration of intravenous contrast. Multiplanar CT image reconstructions and MIPs were obtained to evaluate the vascular anatomy. CONTRAST:  75 cc Isovue 370. COMPARISON:  Chest radiograph 01/15/2016 FINDINGS: Cardiovascular: Satisfactory opacification of the pulmonary arteries to the segmental level. No evidence of pulmonary embolism. Normal heart size. No pericardial effusion. Mild calcific atherosclerotic disease of the coronary arteries and aorta. Mediastinum/Nodes: No enlarged mediastinal, hilar, or axillary lymph nodes. Thyroid gland, trachea, and esophagus demonstrate no significant findings. Lungs/Pleura: Moderate in severity upper lobe predominant paraseptal emphysema. 5 mm right apical pulmonary nodule versus scarring. Atelectatic changes and the lingula and right middle lobe. Upper Abdomen: No acute abnormality. Granulomatous calcifications in the spleen. Musculoskeletal: No chest wall abnormality. No acute or significant osseous findings. Review of the MIP images confirms the above  findings. IMPRESSION: No evidence of pulmonary embolus or other vascular abnormality. Moderate in severity upper lobe predominant emphysematous changes consistent with the given history of COPD. 5 mm right apical sub solid pulmonary nodule. No follow-up recommended. Non-contrast chest CT can be considered in 12 months if patient is high-risk. This recommendation follows the consensus statement: Guidelines for Management of Incidental Pulmonary Nodules Detected on CT Images: From the Fleischner Society 2017; Radiology  2017; 191:478-295. Electronically Signed   By: Ted Mcalpine M.D.   On: 01/15/2016 19:01    EKG:   Orders placed or performed during the hospital encounter of 01/15/16  . ED EKG  . ED EKG  . EKG 12-Lead  . EKG 12-Lead      Management plans discussed with the patient, family and they are in agreement.  CODE STATUS:     Code Status Orders        Start     Ordered   01/15/16 2027  Full code  Continuous     01/15/16 2029    Code Status History    Date Active Date Inactive Code Status Order ID Comments User Context   10/05/2015  3:13 PM 10/08/2015  4:53 PM Full Code [id number]  Alford Highland, MD ED   06/27/2015  2:36 PM 06/29/2015  2:34 PM Full Code [id number]  Katha Hamming, MD ED   03/11/2015  5:29 PM 03/14/2015  3:59 PM Full Code [id number]  Dorothea Ogle, MD Inpatient   10/17/2014  2:31 PM 10/19/2014  2:34 PM Full Code [id number]  Shaune Pollack, MD Inpatient    Advance Directive Documentation   Flowsheet Row Most Recent Value  Type of Advance Directive  Living will [requested copy]  Pre-existing out of facility DNR order (yellow form or pink MOST form)  No data  "MOST" Form in Place?  No data      TOTAL TIME TAKING CARE OF THIS PATIENT: 40 minutes.    Katharina Caper M.D on 01/17/2016 at 12:25 PM  Between 7am to 6pm - Pager - [phone]  After 6pm go to [email or web address] - password [email or web address] [email or web address]  [place] [facility]  Office  [phone]  CC: Primary care  physician; Olevia Perches, DO

## 2016-01-17 NOTE — Progress Notes (Signed)
Patient given discharge teaching and paperwork regarding medications, diet, follow-up appointments and activity. Patient understanding verbalized. No complaints at this time. IV and telemetry discontinued prior to leaving. Skin assessment as previously charted and vitals are stable; on room air. Patient being discharged to home.  No further needs by Care Management.

## 2016-01-17 NOTE — Progress Notes (Signed)
Pt. Slept throughout the night without c/o pain, SOB or acute distress observed.

## 2016-01-30 ENCOUNTER — Encounter: Payer: Self-pay | Admitting: Family Medicine

## 2016-01-30 ENCOUNTER — Ambulatory Visit (INDEPENDENT_AMBULATORY_CARE_PROVIDER_SITE_OTHER): Payer: BLUE CROSS/BLUE SHIELD | Admitting: Family Medicine

## 2016-01-30 VITALS — BP 126/56 | HR 106 | Temp 98.3°F | Wt 180.8 lb

## 2016-01-30 DIAGNOSIS — J9622 Acute and chronic respiratory failure with hypercapnia: Secondary | ICD-10-CM | POA: Diagnosis not present

## 2016-01-30 DIAGNOSIS — F411 Generalized anxiety disorder: Secondary | ICD-10-CM | POA: Diagnosis not present

## 2016-01-30 DIAGNOSIS — I472 Ventricular tachycardia: Secondary | ICD-10-CM | POA: Diagnosis not present

## 2016-01-30 DIAGNOSIS — D72829 Elevated white blood cell count, unspecified: Secondary | ICD-10-CM | POA: Diagnosis not present

## 2016-01-30 DIAGNOSIS — G629 Polyneuropathy, unspecified: Secondary | ICD-10-CM | POA: Diagnosis not present

## 2016-01-30 DIAGNOSIS — I4729 Other ventricular tachycardia: Secondary | ICD-10-CM

## 2016-01-30 DIAGNOSIS — J9621 Acute and chronic respiratory failure with hypoxia: Secondary | ICD-10-CM | POA: Diagnosis not present

## 2016-01-30 MED ORDER — ESCITALOPRAM OXALATE 5 MG PO TABS: 5.0000 mg | ORAL_TABLET | Freq: Every day | ORAL | 1 refills | Status: DC

## 2016-01-30 MED ORDER — TRAMADOL HCL 50 MG PO TABS: 50.0000 mg | ORAL_TABLET | Freq: Two times a day (BID) | ORAL | 1 refills | Status: DC

## 2016-01-30 MED ORDER — LYRICA 75 MG PO CAPS: 75.0000 mg | ORAL_CAPSULE | Freq: Two times a day (BID) | ORAL | 1 refills | Status: DC

## 2016-01-30 MED ORDER — MIRTAZAPINE 15 MG PO TABS: 15.0000 mg | ORAL_TABLET | Freq: Every day | ORAL | 1 refills | Status: DC

## 2016-01-30 NOTE — Progress Notes (Signed)
BP (!) 126/56   Pulse (!) 106   Temp 98.3 F (36.8 C)   Wt 180 lb 12.8 oz (82 kg)   SpO2 92%   BMI 25.94 kg/m    Subjective:    Patient ID: Devin Bryant, male    DOB: 1955/03/21, 60 y.o.   MRN: [id number]  HPI: Devin Bryant is a 60 y.o. male  Chief Complaint  Patient presents with  . Depression  . Hospitalization Follow-up   HOSPITAL FOLLOW UP- went to ER when he got no better with prednisone and azithromycing. Required 2-3L oxygen in hospital. CXR was normal, CTA was normal except for emphysema. He was admitted for COPD exacerbation. He did well with IV steroids and inhalers. He was started on metoprolol due to 10 beat v. tach episode thought to be due to COPD. Time since discharge: 13 days Hospital/facility: [facility] Diagnosis: Acute on Chronic Respiratory failure,    Acute on chronic respiratory failure with hypoxia and hypercapnia (HCC)   Nonsustained ventricular tachycardia (HCC)   Leukocytosis   History of tobacco abuse   History of alcohol abuse #1. Acute on chronic respiratory failure with hypoxia  and likely hypercapnia due to COPD exacerbation, continue steroid taper, inhalation therapy, patient has improved clinically, continue oxygen and wean off oxygen as tolerated, follow-up with pulmonologist as outpatient #2V. tach episode, nonsustained, continue patient on metoprolol, magnesium level was normal, as well as potassium, was suspected related to COPD exacerbation/betamimetics #3. Leukocytosis, likely stress as well as steroid related, follow closely as outpatient #4. History of tobacco abuse, patient continues to smoke, counseled on admission, continue supportive therapy #5. Generalized anxiety disorder, continue home medications #6. History of alcohol abuse, patient quit about 3-4 months ago, patient was congratulated and encouraged to continue abstinence Procedures/tests: CXR, CTA, blood work Consultants: None New medications: Metoprolol, prednisone  taper Discharge instructions:  Follow up here and with pulmonology- saw Dr. Meredeth IdeFleming this morning. Status: better  DEPRESSION Mood status: better Satisfied with current treatment?: no Symptom severity: moderate  Duration of current treatment : 1 month Side effects: no Medication compliance: excellent compliance Psychotherapy/counseling: no  Depressed mood: yes Anxious mood: yes Anhedonia: no Significant weight loss or gain: yes Insomnia: no  Fatigue: no Feelings of worthlessness or guilt: no Impaired concentration/indecisiveness: no Suicidal ideations: no Hopelessness: no Crying spells: no Depression screen [facility] 01/30/2016 12/08/2015 04/11/2015 04/04/2015  Decreased Interest 0 1 0 2  Down, Depressed, Hopeless 1 1 0 1  PHQ - 2 Score 1 2 0 3  Altered sleeping - 0 0 0  Tired, decreased energy - 0 0 0  Change in appetite - 1 0 0  Feeling bad or failure about yourself  - 0 0 1  Trouble concentrating - 0 0 0  Moving slowly or fidgety/restless - 0 0 0  Suicidal thoughts - 0 0 0  PHQ-9 Score - 3 0 4  Difficult doing work/chores - - Not difficult at all Not difficult at all    Relevant past medical, surgical, family and social history reviewed and updated as indicated. Interim medical history since our last visit reviewed. Allergies and medications reviewed and updated.  Review of Systems  Constitutional: Negative.   Respiratory: Negative.   Cardiovascular: Negative.   Psychiatric/Behavioral: Negative.     Per HPI unless specifically indicated above     Objective:    BP (!) 126/56   Pulse (!) 106   Temp 98.3 F (36.8 C)   Wt 180 lb 12.8 oz (  82 kg)   SpO2 92%   BMI 25.94 kg/m   Wt Readings from Last 3 Encounters:  01/30/16 180 lb 12.8 oz (82 kg)  01/17/16 170 lb 6.4 oz (77.3 kg)  12/08/15 167 lb (75.8 kg)    Physical Exam  Constitutional: He is oriented to person, place, and time. He appears well-developed and well-nourished. No distress.  HENT:  Head:  Normocephalic and atraumatic.  Right Ear: Hearing normal.  Left Ear: Hearing normal.  Nose: Nose normal.  Eyes: Conjunctivae and lids are normal. Right eye exhibits no discharge. Left eye exhibits no discharge. No scleral icterus.  Cardiovascular: Normal rate, regular rhythm, normal heart sounds and intact distal pulses.  Exam reveals no gallop and no friction rub.   No murmur heard. Pulmonary/Chest: Effort normal and breath sounds normal. No respiratory distress. He has no wheezes. He has no rales. He exhibits no tenderness.  Musculoskeletal: Normal range of motion.  Neurological: He is alert and oriented to person, place, and time.  Skin: Skin is warm, dry and intact. No rash noted. He is not diaphoretic. No erythema. No pallor.  Psychiatric: He has a normal mood and affect. His speech is normal and behavior is normal. Judgment and thought content normal. Cognition and memory are normal.  Vitals reviewed.   Results for orders placed or performed during the hospital encounter of 01/15/16  Basic metabolic panel  Result Value Ref Range   Sodium 140 135 - 145 mmol/L   Potassium 4.0 3.5 - 5.1 mmol/L   Chloride 98 (L) 101 - 111 mmol/L   CO2 34 (H) 22 - 32 mmol/L   Glucose, Bld 120 (H) 65 - 99 mg/dL   BUN 8 6 - 20 mg/dL   Creatinine, Ser 5.780.62 0.61 - 1.24 mg/dL   Calcium 46.910.2 8.9 - 62.910.3 mg/dL   GFR calc non Af Amer >60 >60 mL/min   GFR calc Af Amer >60 >60 mL/min   Anion gap 8 5 - 15  CBC  Result Value Ref Range   WBC 11.6 (H) 3.8 - 10.6 K/uL   RBC 4.80 4.40 - 5.90 MIL/uL   Hemoglobin 15.5 13.0 - 18.0 g/dL   HCT 52.845.7 41.340.0 - 24.452.0 %   MCV 95.3 80.0 - 100.0 fL   MCH 32.3 26.0 - 34.0 pg   MCHC 33.9 32.0 - 36.0 g/dL   RDW 01.014.2 27.211.5 - 53.614.5 %   Platelets 196 150 - 440 K/uL  Troponin I  Result Value Ref Range   Troponin I <0.03 <0.03 ng/mL  Troponin I  Result Value Ref Range   Troponin I <0.03 <0.03 ng/mL  Influenza panel by PCR (type A & B, H1N1)  Result Value Ref Range   Influenza  A By PCR NEGATIVE NEGATIVE   Influenza B By PCR NEGATIVE NEGATIVE  Basic metabolic panel  Result Value Ref Range   Sodium 139 135 - 145 mmol/L   Potassium 4.7 3.5 - 5.1 mmol/L   Chloride 103 101 - 111 mmol/L   CO2 30 22 - 32 mmol/L   Glucose, Bld 150 (H) 65 - 99 mg/dL   BUN 11 6 - 20 mg/dL   Creatinine, Ser 6.440.58 (L) 0.61 - 1.24 mg/dL   Calcium 9.4 8.9 - 03.410.3 mg/dL   GFR calc non Af Amer >60 >60 mL/min   GFR calc Af Amer >60 >60 mL/min   Anion gap 6 5 - 15  CBC  Result Value Ref Range   WBC 12.4 (H) 3.8 -  10.6 K/uL   RBC 4.64 4.40 - 5.90 MIL/uL   Hemoglobin 14.8 13.0 - 18.0 g/dL   HCT 16.1 09.6 - 04.5 %   MCV 96.0 80.0 - 100.0 fL   MCH 31.8 26.0 - 34.0 pg   MCHC 33.1 32.0 - 36.0 g/dL   RDW 40.9 81.1 - 91.4 %   Platelets 187 150 - 440 K/uL  Glucose, capillary  Result Value Ref Range   Glucose-Capillary 141 (H) 65 - 99 mg/dL  Magnesium  Result Value Ref Range   Magnesium 1.8 1.7 - 2.4 mg/dL      Assessment & Plan:   Problem List Items Addressed This Visit      Cardiovascular and Mediastinum   Nonsustained ventricular tachycardia (HCC)    Pulse still high. Continue to monitor. Recheck 1 month.         Respiratory   Acute on chronic respiratory failure with hypoxia and hypercapnia (HCC)    Resolved. Continue to follow with Dr. Meredeth Ide. Call with any concerns.         Nervous and Auditory   Neuropathy (HCC)    Continue current regimen. Refills given today.         Other   Generalized anxiety disorder - Primary    Better on lexapro, not great. Will increase to 10mg  daily and recheck in 1 month.       Leukocytosis    Rechecking levels today. Await results.       Relevant Orders   CBC with Differential/Platelet       Follow up plan: Return in about 4 weeks (around 02/27/2016) for Anxiety follow up.

## 2016-01-30 NOTE — Assessment & Plan Note (Signed)
Resolved. Continue to follow with Dr. Meredeth IdeFleming. Call with any concerns.

## 2016-01-30 NOTE — Assessment & Plan Note (Signed)
Better on lexapro, not great. Will increase to 10mg  daily and recheck in 1 month.

## 2016-01-30 NOTE — Assessment & Plan Note (Signed)
Pulse still high. Continue to monitor. Recheck 1 month.

## 2016-01-30 NOTE — Assessment & Plan Note (Signed)
Rechecking levels today. Await results.  

## 2016-01-30 NOTE — Assessment & Plan Note (Signed)
Continue current regimen. Refills given today.

## 2016-01-31 ENCOUNTER — Encounter: Payer: Self-pay | Admitting: Family Medicine

## 2016-01-31 LAB — CBC WITH DIFFERENTIAL/PLATELET
BASOS: 0 %
Basophils Absolute: 0 10*3/uL (ref 0.0–0.2)
EOS (ABSOLUTE): 0.2 10*3/uL (ref 0.0–0.4)
EOS: 2 %
HEMATOCRIT: 43.7 % (ref 37.5–51.0)
HEMOGLOBIN: 15 g/dL (ref 12.6–17.7)
IMMATURE GRANULOCYTES: 2 %
Immature Grans (Abs): 0.2 10*3/uL — ABNORMAL HIGH (ref 0.0–0.1)
LYMPHS ABS: 2.4 10*3/uL (ref 0.7–3.1)
Lymphs: 19 %
MCH: 32.3 pg (ref 26.6–33.0)
MCHC: 34.3 g/dL (ref 31.5–35.7)
MCV: 94 fL (ref 79–97)
MONOCYTES: 9 %
MONOS ABS: 1.1 10*3/uL — AB (ref 0.1–0.9)
Neutrophils Absolute: 9.1 10*3/uL — ABNORMAL HIGH (ref 1.4–7.0)
Neutrophils: 68 %
Platelets: 218 10*3/uL (ref 150–379)
RBC: 4.65 x10E6/uL (ref 4.14–5.80)
RDW: 14.4 % (ref 12.3–15.4)
WBC: 13 10*3/uL — ABNORMAL HIGH (ref 3.4–10.8)

## 2016-02-03 ENCOUNTER — Emergency Department: Payer: BLUE CROSS/BLUE SHIELD

## 2016-02-03 ENCOUNTER — Encounter: Payer: Self-pay | Admitting: Emergency Medicine

## 2016-02-03 ENCOUNTER — Inpatient Hospital Stay
Admission: EM | Admit: 2016-02-03 | Discharge: 2016-02-04 | DRG: 190 | Disposition: A | Discharge status: Home / Self Care | Payer: BLUE CROSS/BLUE SHIELD | Attending: Internal Medicine | Admitting: Internal Medicine

## 2016-02-03 DIAGNOSIS — Z7951 Long term (current) use of inhaled steroids: Secondary | ICD-10-CM | POA: Diagnosis not present

## 2016-02-03 DIAGNOSIS — J9621 Acute and chronic respiratory failure with hypoxia: Secondary | ICD-10-CM | POA: Diagnosis present

## 2016-02-03 DIAGNOSIS — Z9981 Dependence on supplemental oxygen: Secondary | ICD-10-CM | POA: Diagnosis not present

## 2016-02-03 DIAGNOSIS — Z7982 Long term (current) use of aspirin: Secondary | ICD-10-CM

## 2016-02-03 DIAGNOSIS — I10 Essential (primary) hypertension: Secondary | ICD-10-CM | POA: Diagnosis present

## 2016-02-03 DIAGNOSIS — Z79899 Other long term (current) drug therapy: Secondary | ICD-10-CM

## 2016-02-03 DIAGNOSIS — J441 Chronic obstructive pulmonary disease with (acute) exacerbation: Principal | ICD-10-CM | POA: Diagnosis present

## 2016-02-03 DIAGNOSIS — E785 Hyperlipidemia, unspecified: Secondary | ICD-10-CM | POA: Diagnosis present

## 2016-02-03 DIAGNOSIS — K7460 Unspecified cirrhosis of liver: Secondary | ICD-10-CM | POA: Diagnosis present

## 2016-02-03 DIAGNOSIS — Z833 Family history of diabetes mellitus: Secondary | ICD-10-CM

## 2016-02-03 DIAGNOSIS — F411 Generalized anxiety disorder: Secondary | ICD-10-CM | POA: Diagnosis present

## 2016-02-03 DIAGNOSIS — D72828 Other elevated white blood cell count: Secondary | ICD-10-CM | POA: Diagnosis present

## 2016-02-03 DIAGNOSIS — Z8249 Family history of ischemic heart disease and other diseases of the circulatory system: Secondary | ICD-10-CM

## 2016-02-03 DIAGNOSIS — F17210 Nicotine dependence, cigarettes, uncomplicated: Secondary | ICD-10-CM | POA: Diagnosis present

## 2016-02-03 DIAGNOSIS — T380X5A Adverse effect of glucocorticoids and synthetic analogues, initial encounter: Secondary | ICD-10-CM | POA: Diagnosis present

## 2016-02-03 DIAGNOSIS — K219 Gastro-esophageal reflux disease without esophagitis: Secondary | ICD-10-CM | POA: Diagnosis present

## 2016-02-03 DIAGNOSIS — R0602 Shortness of breath: Secondary | ICD-10-CM | POA: Diagnosis present

## 2016-02-03 DIAGNOSIS — Z825 Family history of asthma and other chronic lower respiratory diseases: Secondary | ICD-10-CM

## 2016-02-03 DIAGNOSIS — J9601 Acute respiratory failure with hypoxia: Secondary | ICD-10-CM

## 2016-02-03 LAB — BASIC METABOLIC PANEL
ANION GAP: 7 (ref 5–15)
BUN: 8 mg/dL (ref 6–20)
CALCIUM: 9.1 mg/dL (ref 8.9–10.3)
CO2: 31 mmol/L (ref 22–32)
Chloride: 98 mmol/L — ABNORMAL LOW (ref 101–111)
Creatinine, Ser: 0.66 mg/dL (ref 0.61–1.24)
GFR calc non Af Amer: >60 mL/min (ref >60)
GLUCOSE: 130 mg/dL — AB (ref 65–99)
POTASSIUM: 4 mmol/L (ref 3.5–5.1)
Sodium: 136 mmol/L (ref 135–145)

## 2016-02-03 LAB — CBC
HEMATOCRIT: 42.6 % (ref 40.0–52.0)
HEMOGLOBIN: 14.4 g/dL (ref 13.0–18.0)
MCH: 32.1 pg (ref 26.0–34.0)
MCHC: 33.8 g/dL (ref 32.0–36.0)
MCV: 94.9 fL (ref 80.0–100.0)
Platelets: 162 10*3/uL (ref 150–440)
RBC: 4.49 MIL/uL (ref 4.40–5.90)
RDW: 14.6 % — ABNORMAL HIGH (ref 11.5–14.5)
WBC: 14 10*3/uL — ABNORMAL HIGH (ref 3.8–10.6)

## 2016-02-03 LAB — TROPONIN I

## 2016-02-03 MED ORDER — IPRATROPIUM-ALBUTEROL 0.5-2.5 (3) MG/3ML IN SOLN
3.0000 mL | Freq: Once | RESPIRATORY_TRACT | Status: AC
  Administered 2016-02-03: 3 mL via RESPIRATORY_TRACT

## 2016-02-03 MED ORDER — AMOXICILLIN-POT CLAVULANATE 875-125 MG PO TABS
1.0000 | ORAL_TABLET | Freq: Once | ORAL | Status: AC
  Administered 2016-02-03: 1 via ORAL
  Filled 2016-02-03: qty 1

## 2016-02-03 MED ORDER — PANTOPRAZOLE SODIUM 40 MG PO TBEC
40.0000 mg | DELAYED_RELEASE_TABLET | Freq: Every day | ORAL | Status: DC
  Administered 2016-02-03 – 2016-02-04 (×2): 40 mg via ORAL
  Filled 2016-02-03 (×2): qty 1

## 2016-02-03 MED ORDER — ALBUTEROL SULFATE HFA 108 (90 BASE) MCG/ACT IN AERS: 2.0000 | INHALATION_SPRAY | Freq: Four times a day (QID) | RESPIRATORY_TRACT | Status: DC | PRN

## 2016-02-03 MED ORDER — ESCITALOPRAM OXALATE 10 MG PO TABS
10.0000 mg | ORAL_TABLET | Freq: Every day | ORAL | Status: DC
  Administered 2016-02-03 – 2016-02-04 (×2): 10 mg via ORAL
  Filled 2016-02-03 (×2): qty 1

## 2016-02-03 MED ORDER — DOCUSATE SODIUM 100 MG PO CAPS
100.0000 mg | ORAL_CAPSULE | Freq: Two times a day (BID) | ORAL | Status: DC
  Administered 2016-02-03 – 2016-02-04 (×3): 100 mg via ORAL
  Filled 2016-02-03 (×3): qty 1

## 2016-02-03 MED ORDER — ENOXAPARIN SODIUM 40 MG/0.4ML ~~LOC~~ SOLN
40.0000 mg | SUBCUTANEOUS | Status: DC
  Administered 2016-02-03: 40 mg via SUBCUTANEOUS
  Filled 2016-02-03: qty 0.4

## 2016-02-03 MED ORDER — IPRATROPIUM-ALBUTEROL 0.5-2.5 (3) MG/3ML IN SOLN
3.0000 mL | Freq: Four times a day (QID) | RESPIRATORY_TRACT | Status: DC
  Administered 2016-02-03 – 2016-02-04 (×4): 3 mL via RESPIRATORY_TRACT
  Filled 2016-02-03 (×4): qty 3

## 2016-02-03 MED ORDER — ALBUTEROL SULFATE (2.5 MG/3ML) 0.083% IN NEBU: 2.5000 mg | INHALATION_SOLUTION | Freq: Four times a day (QID) | RESPIRATORY_TRACT | Status: DC

## 2016-02-03 MED ORDER — ACETAMINOPHEN 325 MG PO TABS: 650.0000 mg | ORAL_TABLET | Freq: Four times a day (QID) | ORAL | Status: DC | PRN

## 2016-02-03 MED ORDER — ONDANSETRON HCL 4 MG PO TABS
4.0000 mg | ORAL_TABLET | Freq: Four times a day (QID) | ORAL | Status: DC | PRN
Start: 2016-02-03 — End: 2016-02-04

## 2016-02-03 MED ORDER — ACETAMINOPHEN 650 MG RE SUPP: 650.0000 mg | Freq: Four times a day (QID) | RECTAL | Status: DC | PRN

## 2016-02-03 MED ORDER — PREDNISOLONE ACETATE 1 % OP SUSP
1.0000 [drp] | Freq: Every day | OPHTHALMIC | Status: DC
  Administered 2016-02-03 – 2016-02-04 (×4): 1 [drp] via OPHTHALMIC
  Filled 2016-02-03: qty 1

## 2016-02-03 MED ORDER — TRAMADOL HCL 50 MG PO TABS
50.0000 mg | ORAL_TABLET | Freq: Two times a day (BID) | ORAL | Status: DC
  Administered 2016-02-03 – 2016-02-04 (×2): 50 mg via ORAL
  Filled 2016-02-03 (×3): qty 1

## 2016-02-03 MED ORDER — RIFAXIMIN 550 MG PO TABS
550.0000 mg | ORAL_TABLET | Freq: Two times a day (BID) | ORAL | Status: DC
  Administered 2016-02-03 – 2016-02-04 (×3): 550 mg via ORAL
  Filled 2016-02-03 (×3): qty 1

## 2016-02-03 MED ORDER — CEFUROXIME AXETIL 500 MG PO TABS
500.0000 mg | ORAL_TABLET | Freq: Two times a day (BID) | ORAL | Status: DC
  Administered 2016-02-03 – 2016-02-04 (×3): 500 mg via ORAL
  Filled 2016-02-03 (×4): qty 1

## 2016-02-03 MED ORDER — LORATADINE 10 MG PO TABS
10.0000 mg | ORAL_TABLET | Freq: Every day | ORAL | Status: DC
  Administered 2016-02-03 – 2016-02-04 (×2): 10 mg via ORAL
  Filled 2016-02-03 (×2): qty 1

## 2016-02-03 MED ORDER — MIRTAZAPINE 15 MG PO TABS
15.0000 mg | ORAL_TABLET | Freq: Every day | ORAL | Status: DC
  Administered 2016-02-03: 15 mg via ORAL
  Filled 2016-02-03: qty 1

## 2016-02-03 MED ORDER — FLUTICASONE PROPIONATE 50 MCG/ACT NA SUSP
1.0000 | Freq: Every day | NASAL | Status: DC
  Administered 2016-02-03 – 2016-02-04 (×2): 1 via NASAL
  Filled 2016-02-03: qty 16

## 2016-02-03 MED ORDER — SPIRONOLACTONE 25 MG PO TABS
25.0000 mg | ORAL_TABLET | Freq: Every day | ORAL | Status: DC
  Administered 2016-02-03 – 2016-02-04 (×2): 25 mg via ORAL
  Filled 2016-02-03 (×2): qty 1

## 2016-02-03 MED ORDER — ALBUTEROL SULFATE (2.5 MG/3ML) 0.083% IN NEBU: 2.5000 mg | INHALATION_SOLUTION | RESPIRATORY_TRACT | Status: DC | PRN

## 2016-02-03 MED ORDER — METOPROLOL SUCCINATE ER 25 MG PO TB24
25.0000 mg | ORAL_TABLET | Freq: Every day | ORAL | Status: DC
  Administered 2016-02-03: 25 mg via ORAL
  Filled 2016-02-03: qty 1

## 2016-02-03 MED ORDER — ADULT MULTIVITAMIN W/MINERALS CH
1.0000 | ORAL_TABLET | Freq: Every day | ORAL | Status: DC
  Administered 2016-02-03 – 2016-02-04 (×2): 1 via ORAL
  Filled 2016-02-03 (×2): qty 1

## 2016-02-03 MED ORDER — TIOTROPIUM BROMIDE MONOHYDRATE 18 MCG IN CAPS
18.0000 ug | ORAL_CAPSULE | Freq: Every day | RESPIRATORY_TRACT | Status: DC
  Administered 2016-02-04: 18 ug via RESPIRATORY_TRACT
  Filled 2016-02-03: qty 5

## 2016-02-03 MED ORDER — THEOPHYLLINE ER 300 MG PO TB12
300.0000 mg | ORAL_TABLET | Freq: Two times a day (BID) | ORAL | Status: DC
  Administered 2016-02-03 – 2016-02-04 (×3): 300 mg via ORAL
  Filled 2016-02-03 (×4): qty 1

## 2016-02-03 MED ORDER — TRAMADOL HCL 50 MG PO TABS
50.0000 mg | ORAL_TABLET | Freq: Four times a day (QID) | ORAL | Status: DC | PRN
  Administered 2016-02-03: 50 mg via ORAL

## 2016-02-03 MED ORDER — ASPIRIN EC 81 MG PO TBEC
81.0000 mg | DELAYED_RELEASE_TABLET | Freq: Every day | ORAL | Status: DC
  Administered 2016-02-03 – 2016-02-04 (×2): 81 mg via ORAL
  Filled 2016-02-03 (×2): qty 1

## 2016-02-03 MED ORDER — IPRATROPIUM BROMIDE 0.02 % IN SOLN: 0.5000 mg | Freq: Four times a day (QID) | RESPIRATORY_TRACT | Status: DC

## 2016-02-03 MED ORDER — ONDANSETRON HCL 4 MG/2ML IJ SOLN: 4.0000 mg | Freq: Four times a day (QID) | INTRAMUSCULAR | Status: DC | PRN

## 2016-02-03 MED ORDER — ALBUTEROL SULFATE (2.5 MG/3ML) 0.083% IN NEBU
10.0000 mg/h | INHALATION_SOLUTION | Freq: Once | RESPIRATORY_TRACT | Status: AC
  Administered 2016-02-03: 10 mg/h via RESPIRATORY_TRACT
  Filled 2016-02-03: qty 3
  Filled 2016-02-03: qty 20

## 2016-02-03 MED ORDER — PREGABALIN 75 MG PO CAPS
75.0000 mg | ORAL_CAPSULE | Freq: Two times a day (BID) | ORAL | Status: DC
  Administered 2016-02-03 – 2016-02-04 (×3): 75 mg via ORAL
  Filled 2016-02-03 (×3): qty 1

## 2016-02-03 MED ORDER — METHYLPREDNISOLONE SODIUM SUCC 125 MG IJ SOLR
60.0000 mg | INTRAMUSCULAR | Status: DC
  Administered 2016-02-04: 60 mg via INTRAVENOUS
  Filled 2016-02-03: qty 2

## 2016-02-03 MED ORDER — SODIUM CHLORIDE 0.9 % IV BOLUS (SEPSIS)
500.0000 mL | Freq: Once | INTRAVENOUS | Status: AC
  Administered 2016-02-03: 500 mL via INTRAVENOUS

## 2016-02-03 MED ORDER — METHYLPREDNISOLONE SODIUM SUCC 125 MG IJ SOLR
125.0000 mg | Freq: Once | INTRAMUSCULAR | Status: AC
  Administered 2016-02-03: 125 mg via INTRAVENOUS

## 2016-02-03 MED ORDER — MOMETASONE FURO-FORMOTEROL FUM 200-5 MCG/ACT IN AERO
2.0000 | INHALATION_SPRAY | Freq: Two times a day (BID) | RESPIRATORY_TRACT | Status: DC
  Administered 2016-02-03 – 2016-02-04 (×2): 2 via RESPIRATORY_TRACT
  Filled 2016-02-03: qty 8.8

## 2016-02-03 NOTE — Progress Notes (Signed)
Pt has received eye drops per his request, is now resting in bed with eyes closed, o2 remains in place at [facility]. Pt is minimally sob and states now that he feels very tired, hob is elevated, pt does not feel he can tolerate lying flat yet.

## 2016-02-03 NOTE — Progress Notes (Signed)
Pt arrived via stretcher from the Er at 1400. Pt able to ambulate to the bathroom, void, and ambulate to the bed off his O2. @LNC  then placed again on pt, he was a little sob, lungs grossly clear, no wheezing appreciated, sat on [facility] is 97%. HR is regular, pt is alert and oriented x4. Abdomen is soft, bs heard. Pt denied difficulty voiding, ppp, no edema noted. PIV #20 intact to L wrist, site is free of redness and swelling. Pt denied pain. Pt's wife arrived at bedside. Per pt, he had cataract surgery ot his R eye two days ago, and needs the eye drops that he brought in with him. This Clinical research associatewriter reviewed MD orders with the pt, and then called the pharmacy to confirm that eye drops will be reconciled and sent as pt is due shortly for them. Pt is given po fluids, pt stating that he was here just a couple weeks ago for the same thing. Pt is noted ot have a nicotine patch in place to his R shoulder, and pt stated that he still smokes and "thats probably why im back in here". Pt skin is intact. Pt is oriented to room and call bell. Srx2.

## 2016-02-03 NOTE — ED Triage Notes (Signed)
Pt to triage via w/c, prolonged expiration; O2 in place at 4l/min via [place]; st normally wears 2l at night; c/o SHOB since last night, prod cough yellow sputum; denies pain; seen 2wks ago for same and dx with COPD; currently taking cipro

## 2016-02-03 NOTE — ED Notes (Signed)
Pt ambulated around ED on RA and pt's oxygen sat dropped to 84% on RA. Pt short of breath upon return to room. ED MD informed.

## 2016-02-03 NOTE — H&P (Signed)
[facility] - [place] at [facility]   PATIENT NAME: Devin Bryant Cotto    MR#:  [id number]  DATE OF BIRTH:  November 23, 1955  DATE OF ADMISSION:  02/03/2016  PRIMARY CARE PHYSICIAN: Olevia PerchesMegan Johnson, DO   REQUESTING/REFERRING PHYSICIAN: Dr. Don PerkingVeronese  CHIEF COMPLAINT:  Increasing shortness of breath  HISTORY OF PRESENT ILLNESS:  Devin Bryant Nicoletti  is a 60 y.o. male with a known history of COPD on home oxygen at nighttime, tobacco abuse, generalized anxiety disorder, comes to the emergency room with increasing shortness of breath. Patient was found to be hypoxic with 84% on room air. He got several nebulizer treatment and 1 dose of IV Solu-Medrol. He also got a dose of Augmentin. Patient was recently discharged 01/17/2016 with steroid taper and by mouth Ceftin. He uses nebulizer and inhalers at home. Patient is being admitted for acute COPD exacerbation with hypoxia. There is no evidence of infection noted on chest x-ray and white count is borderline elevated  PAST MEDICAL HISTORY:   Past Medical History:  Diagnosis Date  . Adjustment disorder with depressed mood   . Allergy   . Anxiety   . COPD (chronic obstructive pulmonary disease) (HCC)   . ED (erectile dysfunction)   . Elevated glucose   . Elevated serum glutamic pyruvic transaminase (SGPT) level   . Emphysema lung (HCC)   . GERD (gastroesophageal reflux disease)   . Hypertension   . Hypertriglyceridemia   . Liver disease   . Neuropathy (HCC)   . Tobacco use     PAST SURGICAL HISTOIRY:   Past Surgical History:  Procedure Laterality Date  . EYE SURGERY     cataract surgery  . KNEE SURGERY     arthroscopic  . SEPTOPLASTY  Feb 2016  . TONSILLECTOMY      SOCIAL HISTORY:   Social History  Substance Use Topics  . Smoking status: Current Every Day Smoker    Packs/day: 1.00    Years: 40.00    Types: Cigarettes  . Smokeless tobacco: Never Used  . Alcohol use No     Comment: daughter states patient has  been drinking heavily for the past three years with increased drinking over the past 6 months.    FAMILY HISTORY:   Family History  Problem Relation Age of Onset  . Diabetes Mother   . CAD Mother   . Cancer Father     lung  . Heart disease Father   . Heart attack Father   . COPD Sister   . Heart disease Brother   . Heart attack Brother   . COPD Brother   . Hypertension Neg Hx   . Stroke Neg Hx     DRUG ALLERGIES:  No Known Allergies  REVIEW OF SYSTEMS:  Review of Systems  Constitutional: Negative for chills, fever and weight loss.  HENT: Negative for ear discharge, ear pain and nosebleeds.   Eyes: Negative for blurred vision, pain and discharge.  Respiratory: Positive for shortness of breath. Negative for sputum production, wheezing and stridor.   Cardiovascular: Negative for chest pain, palpitations, orthopnea and PND.  Gastrointestinal: Negative for abdominal pain, diarrhea, nausea and vomiting.  Genitourinary: Negative for frequency and urgency.  Musculoskeletal: Negative for back pain and joint pain.  Neurological: Positive for weakness. Negative for sensory change, speech change and focal weakness.  Psychiatric/Behavioral: Negative for depression and hallucinations. The patient is not nervous/anxious.      MEDICATIONS AT HOME:   Prior to Admission medications   Medication  Sig Start Date End Date Taking? Authorizing Provider  ADVAIR HFA 115-21 MCG/ACT inhaler Inhale 2 puffs into the lungs 2 (two) times daily.  10/11/14  Yes Historical Provider, MD  albuterol (PROVENTIL) (2.5 MG/3ML) 0.083% nebulizer solution Take 3 mLs (2.5 mg total) by nebulization every 4 (four) hours as needed for wheezing or shortness of breath. 10/15/14  Yes Kerman PasseyMelinda P Lada, MD  aspirin EC 81 MG EC tablet Take 1 tablet (81 mg total) by mouth daily. 01/18/16  Yes Katharina Caperima [place], MD  BESIVANCE 0.6 % SUSP Place 1 drop into the right eye 4 (four) times daily.  01/24/16  Yes Historical Provider, MD   BROMSITE 0.075 % SOLN Place 1 drop into the right eye 2 (two) times daily.  01/25/16  Yes Historical Provider, MD  cefUROXime (CEFTIN) 500 MG tablet Take 500 mg by mouth 2 (two) times daily.  01/30/16 02/09/16 Yes Historical Provider, MD  Cetirizine HCl (ZYRTEC ALLERGY) 10 MG CAPS Take 10 mg by mouth daily.   Yes Historical Provider, MD  escitalopram (LEXAPRO) 5 MG tablet Take 1 tablet (5 mg total) by mouth daily. Patient taking differently: Take 10 mg by mouth daily.  01/30/16  Yes Megan P Johnson, DO  esomeprazole (NEXIUM) 20 MG capsule Take 20 mg by mouth daily at 12 noon.   Yes Historical Provider, MD  fluticasone (FLONASE) 50 MCG/ACT nasal spray Place 1 spray into both nostrils daily.  07/23/14  Yes Historical Provider, MD  LYRICA 75 MG capsule Take 1 capsule (75 mg total) by mouth 2 (two) times daily. 01/30/16  Yes Megan P Johnson, DO  metoprolol succinate (TOPROL-XL) 25 MG 24 hr tablet Take 1 tablet (25 mg total) by mouth daily. 01/18/16  Yes Katharina Caperima [place]ick[place], MD  mirtazapine (REMERON) 15 MG tablet Take 1 tablet (15 mg total) by mouth at bedtime. 01/30/16  Yes Megan P Johnson, DO  Multiple Vitamin (MULTIVITAMIN) tablet Take 1 tablet by mouth daily.   Yes Historical Provider, MD  nystatin (MYCOSTATIN) 100000 UNIT/ML suspension Take 5 mLs by mouth 4 (four) times daily as needed. thrush 02/08/15  Yes Historical Provider, MD  prednisoLONE acetate (PRED FORTE) 1 % ophthalmic suspension Place 1 drop into the right eye 6 (six) times daily.   Yes Historical Provider, MD  rifaximin (XIFAXAN) 550 MG TABS tablet Take 1 tablet (550 mg total) by mouth 2 (two) times daily. 12/08/15  Yes Megan P Johnson, DO  spironolactone (ALDACTONE) 25 MG tablet Take 1 tablet (25 mg total) by mouth daily. 12/08/15  Yes Megan P Johnson, DO  theophylline (THEODUR) 300 MG 12 hr tablet Take 300 mg by mouth 2 (two) times daily. 03/02/15  Yes Historical Provider, MD  tiotropium (SPIRIVA HANDIHALER) 18 MCG inhalation capsule Place 1  capsule (18 mcg total) into inhaler and inhale daily. 10/19/14  Yes Srikar Sudini, MD  traMADol (ULTRAM) 50 MG tablet Take 1 tablet (50 mg total) by mouth 2 (two) times daily. 01/30/16  Yes Megan P Johnson, DO  albuterol (PROVENTIL HFA;VENTOLIN HFA) 108 (90 BASE) MCG/ACT inhaler Inhale 2 puffs into the lungs every 6 (six) hours as needed for wheezing or shortness of breath.     Historical Provider, MD      VITAL SIGNS:  Blood pressure 117/76, pulse (!) 104, temperature 97.5 F (36.4 C), temperature source Oral, resp. rate (!) 21, height 5\' 10"  (1.778 m), weight 79.4 kg (175 lb), SpO2 100 %.  PHYSICAL EXAMINATION:  GENERAL:  60 y.o.-year-old patient lying in the bed with no  acute distress.  EYES: Pupils equal, round, reactive to light and accommodation. No scleral icterus. Extraocular muscles intact.  HEENT: Head atraumatic, normocephalic. Oropharynx and nasopharynx clear.  NECK:  Supple, no jugular venous distention. No thyroid enlargement, no tenderness.  LUNGS: Distant breath sounds bilaterally, no wheezing, rales,rhonchi or crepitation. No use of accessory muscles of respiration.  CARDIOVASCULAR: S1, S2 normal. No murmurs, rubs, or gallops.  ABDOMEN: Soft, nontender, nondistended. Bowel sounds present. No organomegaly or mass.  EXTREMITIES: No pedal edema, cyanosis, or clubbing.  NEUROLOGIC: Cranial nerves II through XII are intact. Muscle strength 5/5 in all extremities. Sensation intact. Gait not checked.  PSYCHIATRIC: The patient is alert and oriented x 3.  SKIN: No obvious rash, lesion, or ulcer.   LABORATORY PANEL:   CBC  Recent Labs Lab 02/03/16 0651  WBC 14.0*  HGB 14.4  HCT 42.6  PLT 162   ------------------------------------------------------------------------------------------------------------------  Chemistries   Recent Labs Lab 02/03/16 0651  NA 136  K 4.0  CL 98*  CO2 31  GLUCOSE 130*  BUN 8  CREATININE 0.66  CALCIUM 9.1    ------------------------------------------------------------------------------------------------------------------  Cardiac Enzymes  Recent Labs Lab 02/03/16 0651  TROPONINI <0.03   ------------------------------------------------------------------------------------------------------------------  RADIOLOGY:  Dg Chest Port 1 View  Result Date: 02/03/2016 CLINICAL DATA:  Shortness of breath with productive cough. Current smoker. EXAM: PORTABLE CHEST 1 VIEW COMPARISON:  Chest radiograph 01/15/2016. FINDINGS: COPD with hyperinflation. Normal heart size. No infiltrates or failure. No effusion or pneumothorax. Bones unremarkable. IMPRESSION: Stable chest.  COPD without active infiltrates or failure. Electronically Signed   By: Elsie Stain M.D.   On: 02/03/2016 07:56    EKG:    IMPRESSION AND PLAN:  Altin Sease  is a 60 y.o. male with a known history of COPD on home oxygen at nighttime, tobacco abuse, generalized anxiety disorder, comes to the emergency room with increasing shortness of breath. Patient was found to be hypoxic with 84% on room air. He got several nebulizer treatment and 1 dose of IV Solu-Medrol. He also got a dose of Augmentin. 1. Acute on chronic respiratory failure with hypoxia and likely hypercapnia due to COPD exacerbation - continue IV steroid , inhalation therapy, -Patient follows with Dr. Meredeth Ide as outpatient. Assess for continuous home oxygen therapy prior to discharge -We'll continue home dose of Ceftin  #2 sinus tachycardia secondary to COPD exacerbation   #3. Leukocytosis, likely stress as well as steroid related, follow closely as outpatient  #4. History of tobacco abuse, patient continues to smoke, counseled on admission, continue supportive therapy  #5. Generalized anxiety disorder, continue home medications  #6. History of alcohol abuse, patient quit about 3-4 months ago  All the records are reviewed and case discussed with ED  provider. Management plans discussed with the patient, family and they are in agreement.  CODE STATUS: FULL  TOTAL TIME TAKING CARE OF THIS PATIENT: 50 minutes.    Quante Pettry M.D on 02/03/2016 at 12:35 PM  Between 7am to 6pm - Pager - [phone]  After 6pm go to [email or web address] - password [email or web address] [facility]  [place] [facility]  Office  [phone]  CC: Primary care physician; Olevia Perches, DO

## 2016-02-03 NOTE — ED Notes (Signed)
Pt ambulated to toilet in room, no difficulty or distress noted.

## 2016-02-03 NOTE — ED Provider Notes (Signed)
[facility] Emergency Department Provider Note  ____________________________________________  Time seen: Approximately 7:14 AM  I have reviewed the triage vital signs and the nursing notes.   HISTORY  Chief Complaint Shortness of Breath   HPI Devin Bryant is a 60 y.o. male with h/o COPD on 2L [place] at night, HTN, HLD, cirrhosis, active smoking who presents for evaluation of shortness of breath and wheezing. [facility] for a COPD exacerbation. He followed up with his doctor 4 days ago and still had coughing however the wheezing and shortness of breath were improved. He was started on Ceftin twice a day for 7 days which is still taking. He reports that he finished his steroid course for days ago. 2 days ago patient started having worsening shortness of breath and wheezing. He has been using his inhalers at home with minimal relief. No fever or chills, no chest pain. The coughing is now productive of yellow/green sputum. Patient continues to smoke.  Past Medical History:  Diagnosis Date  . Adjustment disorder with depressed mood   . Allergy   . Anxiety   . COPD (chronic obstructive pulmonary disease) (HCC)   . ED (erectile dysfunction)   . Elevated glucose   . Elevated serum glutamic pyruvic transaminase (SGPT) level   . Emphysema lung (HCC)   . GERD (gastroesophageal reflux disease)   . Hypertension   . Hypertriglyceridemia   . Liver disease   . Neuropathy (HCC)   . Tobacco use     Patient Active Problem List   Diagnosis Date Noted  . Acute on chronic respiratory failure with hypoxia (HCC) 02/03/2016  . Neuropathy (HCC) 01/30/2016  . Acute on chronic respiratory failure with hypoxia and hypercapnia (HCC) 01/17/2016  . Nonsustained ventricular tachycardia (HCC) 01/17/2016  . Leukocytosis 01/17/2016  . History of tobacco abuse 01/17/2016  . History of alcohol abuse 01/17/2016  . Liver cirrhosis, alcoholic (HCC)  12/08/2015  . Alcoholic hepatitis 10/10/2015  . Encephalopathy, hepatic (HCC) 10/10/2015  . Anxiety and depression 10/10/2015  . Hypokalemia 10/10/2015  . Psoriasis 10/10/2015  . Acute on chronic respiratory failure (HCC) 06/27/2015  . Macrocytosis without anemia 04/11/2015  . Alcoholism in recovery (HCC) 04/04/2015  . Tobacco abuse 10/19/2014  . Generalized anxiety disorder 10/17/2014  . Allergy   . Hypertension   . COPD (chronic obstructive pulmonary disease) (HCC)   . ED (erectile dysfunction)   . Hypertriglyceridemia   . Elevated glucose   . Elevated serum glutamic pyruvic transaminase (SGPT) level     Past Surgical History:  Procedure Laterality Date  . EYE SURGERY     cataract surgery  . KNEE SURGERY     arthroscopic  . SEPTOPLASTY  Feb 2016  . TONSILLECTOMY      Prior to Admission medications   Medication Sig Start Date End Date Taking? Authorizing Provider  ADVAIR HFA 115-21 MCG/ACT inhaler Inhale 2 puffs into the lungs 2 (two) times daily.  10/11/14  Yes Historical Provider, MD  albuterol (PROVENTIL) (2.5 MG/3ML) 0.083% nebulizer solution Take 3 mLs (2.5 mg total) by nebulization every 4 (four) hours as needed for wheezing or shortness of breath. 10/15/14  Yes Kerman PasseyMelinda P Lada, MD  aspirin EC 81 MG EC tablet Take 1 tablet (81 mg total) by mouth daily. 01/18/16  Yes Katharina Caperima Vaickute, MD  BESIVANCE 0.6 % SUSP Place 1 drop into the right eye 4 (four) times daily.  01/24/16  Yes Historical Provider, MD  BROMSITE 0.075 % SOLN  Place 1 drop into the right eye 2 (two) times daily.  01/25/16  Yes Historical Provider, MD  cefUROXime (CEFTIN) 500 MG tablet Take 500 mg by mouth 2 (two) times daily.  01/30/16 02/09/16 Yes Historical Provider, MD  Cetirizine HCl (ZYRTEC ALLERGY) 10 MG CAPS Take 10 mg by mouth daily.   Yes Historical Provider, MD  escitalopram (LEXAPRO) 5 MG tablet Take 1 tablet (5 mg total) by mouth daily. Patient taking differently: Take 10 mg by mouth daily.  01/30/16   Yes Megan P Johnson, DO  esomeprazole (NEXIUM) 20 MG capsule Take 20 mg by mouth daily at 12 noon.   Yes Historical Provider, MD  fluticasone (FLONASE) 50 MCG/ACT nasal spray Place 1 spray into both nostrils daily.  07/23/14  Yes Historical Provider, MD  LYRICA 75 MG capsule Take 1 capsule (75 mg total) by mouth 2 (two) times daily. 01/30/16  Yes Megan P Johnson, DO  metoprolol succinate (TOPROL-XL) 25 MG 24 hr tablet Take 1 tablet (25 mg total) by mouth daily. 01/18/16  Yes Katharina Caper, MD  mirtazapine (REMERON) 15 MG tablet Take 1 tablet (15 mg total) by mouth at bedtime. 01/30/16  Yes Megan P Johnson, DO  Multiple Vitamin (MULTIVITAMIN) tablet Take 1 tablet by mouth daily.   Yes Historical Provider, MD  nystatin (MYCOSTATIN) 100000 UNIT/ML suspension Take 5 mLs by mouth 4 (four) times daily as needed. thrush 02/08/15  Yes Historical Provider, MD  prednisoLONE acetate (PRED FORTE) 1 % ophthalmic suspension Place 1 drop into the right eye 6 (six) times daily.   Yes Historical Provider, MD  rifaximin (XIFAXAN) 550 MG TABS tablet Take 1 tablet (550 mg total) by mouth 2 (two) times daily. 12/08/15  Yes Megan P Johnson, DO  spironolactone (ALDACTONE) 25 MG tablet Take 1 tablet (25 mg total) by mouth daily. 12/08/15  Yes Megan P Johnson, DO  theophylline (THEODUR) 300 MG 12 hr tablet Take 300 mg by mouth 2 (two) times daily. 03/02/15  Yes Historical Provider, MD  tiotropium (SPIRIVA HANDIHALER) 18 MCG inhalation capsule Place 1 capsule (18 mcg total) into inhaler and inhale daily. 10/19/14  Yes Srikar Sudini, MD  traMADol (ULTRAM) 50 MG tablet Take 1 tablet (50 mg total) by mouth 2 (two) times daily. 01/30/16  Yes Megan P Johnson, DO  albuterol (PROVENTIL HFA;VENTOLIN HFA) 108 (90 BASE) MCG/ACT inhaler Inhale 2 puffs into the lungs every 6 (six) hours as needed for wheezing or shortness of breath.     Historical Provider, MD  predniSONE (DELTASONE) 10 MG tablet Taper by 10 mg daily and then continue 10 mg  till seen by pulmonary 02/05/16   Enedina Finner, MD    Allergies Patient has no known allergies.  Family History  Problem Relation Age of Onset  . Diabetes Mother   . CAD Mother   . Cancer Father     lung  . Heart disease Father   . Heart attack Father   . COPD Sister   . Heart disease Brother   . Heart attack Brother   . COPD Brother   . Hypertension Neg Hx   . Stroke Neg Hx     Social History Social History  Substance Use Topics  . Smoking status: Current Every Day Smoker    Packs/day: 1.00    Years: 40.00    Types: Cigarettes  . Smokeless tobacco: Never Used  . Alcohol use No     Comment: daughter states patient has been drinking heavily for the past three  years with increased drinking over the past 6 months.    Review of Systems  Constitutional: Negative for fever. Eyes: Negative for visual changes. ENT: Negative for sore throat. Neck: No neck pain  Cardiovascular: Negative for chest pain. Respiratory: +shortness of breath and wheezing Gastrointestinal: Negative for abdominal pain, vomiting or diarrhea. Genitourinary: Negative for dysuria. Musculoskeletal: Negative for back pain. Skin: Negative for rash. Neurological: Negative for headaches, weakness or numbness. Psych: No SI or HI  ____________________________________________   PHYSICAL EXAM:  VITAL SIGNS: ED Triage Vitals  Enc Vitals Group     BP 02/03/16 0632 (!) 148/81     Pulse Rate 02/03/16 0632 (!) 114     Resp 02/03/16 0632 (!) 34     Temp 02/03/16 0632 97.6 F (36.4 C)     Temp Source 02/03/16 0632 Oral     SpO2 02/03/16 0632 90 %     Weight 02/03/16 0659 175 lb (79.4 kg)     Height 02/03/16 0659 5\' 10"  (1.778 m)     Head Circumference --      Peak Flow --      Pain Score 02/03/16 0659 0     Pain Loc --      Pain Edu? --      Excl. in GC? --     Constitutional: Alert and oriented. Moderate distress. HEENT:      Head: Normocephalic and atraumatic.         Eyes: Conjunctivae are  normal. Sclera is non-icteric. EOMI. PERRL      Mouth/Throat: Mucous membranes are moist.       Neck: Supple with no signs of meningismus. Cardiovascular: Tachycardic with regular rhythm. No murmurs, gallops, or rubs. 2+ symmetrical distal pulses are present in all extremities. No JVD. Respiratory: Increased WOB, tachypneic to mid 30s, sating 90% on RA,  speaking in 4 word sentences, severely diminished air movement and faint expiratory wheezing, no crackles Gastrointestinal: Soft, non tender, and non distended with positive bowel sounds. No rebound or guarding. Genitourinary: No CVA tenderness. Musculoskeletal: Nontender with normal range of motion in all extremities. No edema, cyanosis, or erythema of extremities. Neurologic: Normal speech and language. Face is symmetric. Moving all extremities. No gross focal neurologic deficits are appreciated. Skin: Skin is warm, dry and intact. No rash noted. Psychiatric: Mood and affect are normal. Speech and behavior are normal.  ____________________________________________   LABS (all labs ordered are listed, but only abnormal results are displayed)  Labs Reviewed  BASIC METABOLIC PANEL - Abnormal; Notable for the following:       Result Value   Chloride 98 (*)    Glucose, Bld 130 (*)    All other components within normal limits  CBC - Abnormal; Notable for the following:    WBC 14.0 (*)    RDW 14.6 (*)    All other components within normal limits  BASIC METABOLIC PANEL - Abnormal; Notable for the following:    Glucose, Bld 136 (*)    Creatinine, Ser 0.56 (*)    All other components within normal limits  TROPONIN I   ____________________________________________  EKG  ED ECG REPORT I, Nita Sicklearolina Neesa Knapik, the attending physician, personally viewed and interpreted this ECG. Sinus tachycardia, rate of 1:15, normal intervals, borderline right axis deviation, no ST elevations or depressions.   ____________________________________________  RADIOLOGY  CXR: Stable chest. COPD without active infiltrates or failure. ____________________________________________   PROCEDURES  Procedure(s) performed: None Procedures Critical Care performed: yes  CRITICAL CARE Performed by:  Nita Sickle  ?  Total critical care time: 35 min  Critical care time was exclusive of separately billable procedures and treating other patients.  Critical care was necessary to treat or prevent imminent or life-threatening deterioration.  Critical care was time spent personally by me on the following activities: development of treatment plan with patient and/or surrogate as well as nursing, discussions with consultants, evaluation of patient's response to treatment, examination of patient, obtaining history from patient or surrogate, ordering and performing treatments and interventions, ordering and review of laboratory studies, ordering and review of radiographic studies, pulse oximetry and re-evaluation of patient's condition.  ____________________________________________   INITIAL IMPRESSION / ASSESSMENT AND PLAN / ED COURSE   60 y.o. male with h/o COPD on 2L [place] at night, HTN, HLD, cirrhosis, active smoking who presents for evaluation of shortness of breath and wheezing x 2 days. Patient recently discharged after being admitted for COPD exacerbation. Currently on ceftin. No fever or chills. Last steroid 4 days ago. Patient is in moderate respiratory distress able to speak in 4 word sentences with severely diminished air movement and expiratory wheezes. This exam was performed after patient received 3 DuoNeb in the emergency room and a dose of Solu-Medrol. His chest x-ray shows no infiltrate. EKG shows sinus tachycardia with no evidence of ischemia. White count elevated at 14 however patient was recently on prednisone. Troponin is negative. We'll continue to monitor and reassess within 30  minutes.  Clinical Course as of Feb 05 1544  Fri Feb 03, 2016  1610 Patient continues to have severely diminished air movement and wheezing. I was able to reduce [place] from 4L to 2L. Will give 10mg  of albuterol and reassess  [CV]  1023 Patient ambulated and decided to the upper 80s on room air after 3 DuoNeb treatments and 10 mg of albuterol over 1 hour. We'll admit to the hospitalist.  [CV]    Clinical Course User Index [CV] Nita Sickle, MD    Pertinent labs & imaging results that were available during my care of the patient were reviewed by me and considered in my medical decision making (see chart for details).    ____________________________________________   FINAL CLINICAL IMPRESSION(S) / ED DIAGNOSES  Final diagnoses:  Acute respiratory failure with hypoxia (HCC)      NEW MEDICATIONS STARTED DURING THIS VISIT:  Discharge Medication List as of 02/04/2016 10:42 AM    START taking these medications   Details  predniSONE (DELTASONE) 10 MG tablet Taper by 10 mg daily and then continue 10 mg till seen by pulmonary, Normal         Note:  This document was prepared using Dragon voice recognition software and may include unintentional dictation errors.    Nita Sickle, MD 02/05/16 [phone]

## 2016-02-04 LAB — BASIC METABOLIC PANEL
ANION GAP: 6 (ref 5–15)
BUN: 12 mg/dL (ref 6–20)
CHLORIDE: 104 mmol/L (ref 101–111)
CO2: 28 mmol/L (ref 22–32)
Calcium: 9.3 mg/dL (ref 8.9–10.3)
Creatinine, Ser: 0.56 mg/dL — ABNORMAL LOW (ref 0.61–1.24)
GFR calc non Af Amer: >60 mL/min (ref >60)
Glucose, Bld: 136 mg/dL — ABNORMAL HIGH (ref 65–99)
POTASSIUM: 4.2 mmol/L (ref 3.5–5.1)
SODIUM: 138 mmol/L (ref 135–145)

## 2016-02-04 MED ORDER — PREDNISONE 50 MG PO TABS: 50.0000 mg | ORAL_TABLET | Freq: Every day | ORAL | Status: DC

## 2016-02-04 MED ORDER — PREDNISONE 10 MG PO TABS: ORAL_TABLET | ORAL | 0 refills | Status: DC

## 2016-02-04 MED ORDER — BROMFENAC SODIUM 0.075 % OP SOLN
1.0000 [drp] | Freq: Two times a day (BID) | OPHTHALMIC | Status: DC
  Administered 2016-02-04: 1 [drp] via OPHTHALMIC
  Filled 2016-02-04: qty 5

## 2016-02-04 MED ORDER — BESIFLOXACIN HCL 0.6 % OP SUSP
1.0000 [drp] | Freq: Four times a day (QID) | OPHTHALMIC | Status: DC
  Administered 2016-02-04: 1 [drp] via OPHTHALMIC
  Filled 2016-02-04: qty 5

## 2016-02-04 NOTE — Progress Notes (Signed)
All eyes drops given to patient for discharge.

## 2016-02-04 NOTE — Discharge Summary (Signed)
[facility] [place] [facility]   PATIENT NAME: Devin Bryant    MR#:  [id number]  DATE OF BIRTH:  Aug 08, 1955  DATE OF ADMISSION:  02/03/2016 ADMITTING PHYSICIAN: Enedina Finner, MD  DATE OF DISCHARGE: 02/04/16  PRIMARY CARE PHYSICIAN: Olevia Perches, DO    ADMISSION DIAGNOSIS:  Acute respiratory failure with hypoxia (HCC) [J96.01]  DISCHARGE DIAGNOSIS:  Acute on chronic respiratory failure due to COPd flare  SECONDARY DIAGNOSIS:   Past Medical History:  Diagnosis Date  . Adjustment disorder with depressed mood   . Allergy   . Anxiety   . COPD (chronic obstructive pulmonary disease) (HCC)   . ED (erectile dysfunction)   . Elevated glucose   . Elevated serum glutamic pyruvic transaminase (SGPT) level   . Emphysema lung (HCC)   . GERD (gastroesophageal reflux disease)   . Hypertension   . Hypertriglyceridemia   . Liver disease   . Neuropathy (HCC)   . Tobacco use     HOSPITAL COURSE:   Devin Bryant  is a 60 y.o. male with a known history of COPD on home oxygen at nighttime, tobacco abuse, generalized anxiety disorder, comes to the emergency room with increasing shortness of breath. Patient was found to be hypoxic with 84% on room air. He got several nebulizer treatment and 1 dose of IV Solu-Medrol. He also got a dose of Augmentin. 1. Acute on chronic respiratory failure with hypoxia and likely hypercapnia due to COPD exacerbation - continue IV steroid, inhalation therapy -Patient follows with Dr. Meredeth Ide as outpatient. Maryclare Labrador continue home dose of Ceftin -taper steroids to 10 mg and then continue for few days till seen by dr Meredeth Ide.   #2 sinus tachycardia secondary to COPD exacerbation  -improved  #3. Leukocytosis, likely stress as well as steroid related, follow closelyas outpatient  #4. History of tobacco abuse, patient continues to smoke, counseled on admission, continue supportive therapy  #5. Generalized anxiety disorder,  continue home medications  #6. History of alcohol abuse, patient quit about 3-4 months ago  D/c home CONSULTS OBTAINED:    DRUG ALLERGIES:  No Known Allergies  DISCHARGE MEDICATIONS:   Current Discharge Medication List    START taking these medications   Details  predniSONE (DELTASONE) 10 MG tablet Taper by 10 mg daily and then continue 10 mg till seen by pulmonary Qty: 25 tablet, Refills: 0      CONTINUE these medications which have NOT CHANGED   Details  ADVAIR HFA 115-21 MCG/ACT inhaler Inhale 2 puffs into the lungs 2 (two) times daily.     albuterol (PROVENTIL) (2.5 MG/3ML) 0.083% nebulizer solution Take 3 mLs (2.5 mg total) by nebulization every 4 (four) hours as needed for wheezing or shortness of breath. Qty: 50 mL, Refills: 3    aspirin EC 81 MG EC tablet Take 1 tablet (81 mg total) by mouth daily. Qty: 30 tablet, Refills: 6    BESIVANCE 0.6 % SUSP Place 1 drop into the right eye 4 (four) times daily.     BROMSITE 0.075 % SOLN Place 1 drop into the right eye 2 (two) times daily.     cefUROXime (CEFTIN) 500 MG tablet Take 500 mg by mouth 2 (two) times daily.     Cetirizine HCl (ZYRTEC ALLERGY) 10 MG CAPS Take 10 mg by mouth daily.    escitalopram (LEXAPRO) 5 MG tablet Take 1 tablet (5 mg total) by mouth daily. Qty: 90 tablet, Refills: 1    esomeprazole (NEXIUM) 20 MG capsule  Take 20 mg by mouth daily at 12 noon.    fluticasone (FLONASE) 50 MCG/ACT nasal spray Place 1 spray into both nostrils daily.     LYRICA 75 MG capsule Take 1 capsule (75 mg total) by mouth 2 (two) times daily. Qty: 180 capsule, Refills: 1    metoprolol succinate (TOPROL-XL) 25 MG 24 hr tablet Take 1 tablet (25 mg total) by mouth daily. Qty: 30 tablet, Refills: 6    mirtazapine (REMERON) 15 MG tablet Take 1 tablet (15 mg total) by mouth at bedtime. Qty: 90 tablet, Refills: 1    Multiple Vitamin (MULTIVITAMIN) tablet Take 1 tablet by mouth daily.    nystatin (MYCOSTATIN) 100000  UNIT/ML suspension Take 5 mLs by mouth 4 (four) times daily as needed. thrush Refills: 13    prednisoLONE acetate (PRED FORTE) 1 % ophthalmic suspension Place 1 drop into the right eye 6 (six) times daily.    rifaximin (XIFAXAN) 550 MG TABS tablet Take 1 tablet (550 mg total) by mouth 2 (two) times daily. Qty: 60 tablet, Refills: 0    spironolactone (ALDACTONE) 25 MG tablet Take 1 tablet (25 mg total) by mouth daily. Qty: 30 tablet, Refills: 0    theophylline (THEODUR) 300 MG 12 hr tablet Take 300 mg by mouth 2 (two) times daily. Refills: 11    tiotropium (SPIRIVA HANDIHALER) 18 MCG inhalation capsule Place 1 capsule (18 mcg total) into inhaler and inhale daily. Qty: 30 capsule, Refills: 0    traMADol (ULTRAM) 50 MG tablet Take 1 tablet (50 mg total) by mouth 2 (two) times daily. Qty: 60 tablet, Refills: 1    albuterol (PROVENTIL HFA;VENTOLIN HFA) 108 (90 BASE) MCG/ACT inhaler Inhale 2 puffs into the lungs every 6 (six) hours as needed for wheezing or shortness of breath.         If you experience worsening of your admission symptoms, develop shortness of breath, life threatening emergency, suicidal or homicidal thoughts you must seek medical attention immediately by calling 911 or calling your MD immediately  if symptoms less severe.  You Must read complete instructions/literature along with all the possible adverse reactions/side effects for all the Medicines you take and that have been prescribed to you. Take any new Medicines after you have completely understood and accept all the possible adverse reactions/side effects.   Please note  You were cared for by a hospitalist during your hospital stay. If you have any questions about your discharge medications or the care you received while you were in the hospital after you are discharged, you can call the unit and asked to speak with the hospitalist on call if the hospitalist that took care of you is not available. Once you are  discharged, your primary care physician will handle any further medical issues. Please note that NO REFILLS for any discharge medications will be authorized once you are discharged, as it is imperative that you return to your primary care physician (or establish a relationship with a primary care physician if you do not have one) for your aftercare needs so that they can reassess your need for medications and monitor your lab values. Today   SUBJECTIVE   Feels good  VITAL SIGNS:  Blood pressure 111/69, pulse 97, temperature 97.4 F (36.3 C), temperature source Oral, resp. rate 18, height 5\' 10"  (1.778 m), weight 78.9 kg (174 lb), SpO2 94 %.  I/O:  No intake or output data in the 24 hours ending 02/04/16 1018  PHYSICAL EXAMINATION:  GENERAL:  60  y.o.-year-old patient lying in the bed with no acute distress.  EYES: Pupils equal, round, reactive to light and accommodation. No scleral icterus. Extraocular muscles intact.  HEENT: Head atraumatic, normocephalic. Oropharynx and nasopharynx clear.  NECK:  Supple, no jugular venous distention. No thyroid enlargement, no tenderness.  LUNGS: Normal breath sounds bilaterally, no wheezing, rales,rhonchi or crepitation. No use of accessory muscles of respiration.  CARDIOVASCULAR: S1, S2 normal. No murmurs, rubs, or gallops.  ABDOMEN: Soft, non-tender, non-distended. Bowel sounds present. No organomegaly or mass.  EXTREMITIES: No pedal edema, cyanosis, or clubbing.  NEUROLOGIC: Cranial nerves II through XII are intact. Muscle strength 5/5 in all extremities. Sensation intact. Gait not checked.  PSYCHIATRIC: The patient is alert and oriented x 3.  SKIN: No obvious rash, lesion, or ulcer.   DATA REVIEW:   CBC   Recent Labs Lab 02/03/16 0651  WBC 14.0*  HGB 14.4  HCT 42.6  PLT 162    Chemistries   Recent Labs Lab 02/04/16 0343  NA 138  K 4.2  CL 104  CO2 28  GLUCOSE 136*  BUN 12  CREATININE 0.56*  CALCIUM 9.3    Microbiology  Results   No results found for this or any previous visit (from the past 240 hour(s)).  RADIOLOGY:  Dg Chest Port 1 View  Result Date: 02/03/2016 CLINICAL DATA:  Shortness of breath with productive cough. Current smoker. EXAM: PORTABLE CHEST 1 VIEW COMPARISON:  Chest radiograph 01/15/2016. FINDINGS: COPD with hyperinflation. Normal heart size. No infiltrates or failure. No effusion or pneumothorax. Bones unremarkable. IMPRESSION: Stable chest.  COPD without active infiltrates or failure. Electronically Signed   By: Elsie StainJohn T Curnes M.D.   On: 02/03/2016 07:56     Management plans discussed with the patient, family and they are in agreement.  CODE STATUS:     Code Status Orders        Start     Ordered   02/03/16 1401  Full code  Continuous     02/03/16 1400    Code Status History    Date Active Date Inactive Code Status Order ID Comments User Context   01/15/2016  8:29 PM 01/17/2016  3:32 PM Full Code [id number]  Katha HammingSnehalatha Konidena, MD ED   10/05/2015  3:13 PM 10/08/2015  4:53 PM Full Code [id number]  Alford Highlandichard Wieting, MD ED   06/27/2015  2:36 PM 06/29/2015  2:34 PM Full Code [id number]  Katha HammingSnehalatha Konidena, MD ED   03/11/2015  5:29 PM 03/14/2015  3:59 PM Full Code [id number]  Dorothea OgleIskra M Myers, MD Inpatient   10/17/2014  2:31 PM 10/19/2014  2:34 PM Full Code [id number]  Shaune PollackQing Chen, MD Inpatient    Advance Directive Documentation   Flowsheet Row Most Recent Value  Type of Advance Directive  Living will  Pre-existing out of facility DNR order (yellow form or pink MOST form)  No data  "MOST" Form in Place?  No data      TOTAL TIME TAKING CARE OF THIS PATIENT: 40 minutes.    Guenevere Roorda M.D on 02/04/2016 at 10:18 AM  Between 7am to 6pm - Pager - [phone] After 6pm go to [email or web address] - password [email or web address]  [facility]  Office  [phone]  CC: Primary care physician; Olevia PerchesMegan Johnson, DO

## 2016-02-04 NOTE — Discharge Instructions (Signed)
Use your oxygen, nebs and inhalers as before ?

## 2016-02-15 ENCOUNTER — Telehealth: Payer: Self-pay | Admitting: Family Medicine

## 2016-02-15 NOTE — Telephone Encounter (Signed)
Spoke with patient, to let me know that the tramadol can not be sent into mail order. He will call and check with the pharmacy local to see if they have it.

## 2016-02-15 NOTE — Telephone Encounter (Signed)
[facility] says pharmacy didn't receive it on 01/30/16.

## 2016-02-29 ENCOUNTER — Inpatient Hospital Stay: Payer: BLUE CROSS/BLUE SHIELD | Admitting: Family Medicine

## 2016-04-16 ENCOUNTER — Ambulatory Visit (INDEPENDENT_AMBULATORY_CARE_PROVIDER_SITE_OTHER): Payer: BLUE CROSS/BLUE SHIELD | Admitting: Family Medicine

## 2016-04-16 ENCOUNTER — Encounter: Payer: Self-pay | Admitting: Family Medicine

## 2016-04-16 VITALS — BP 143/101 | HR 127 | Temp 98.2°F | Wt 184.0 lb

## 2016-04-16 DIAGNOSIS — F418 Other specified anxiety disorders: Secondary | ICD-10-CM | POA: Diagnosis not present

## 2016-04-16 DIAGNOSIS — F329 Major depressive disorder, single episode, unspecified: Secondary | ICD-10-CM

## 2016-04-16 DIAGNOSIS — F419 Anxiety disorder, unspecified: Secondary | ICD-10-CM

## 2016-04-16 DIAGNOSIS — M7021 Olecranon bursitis, right elbow: Secondary | ICD-10-CM

## 2016-04-16 DIAGNOSIS — F32A Depression, unspecified: Secondary | ICD-10-CM

## 2016-04-16 MED ORDER — ALBUTEROL SULFATE HFA 108 (90 BASE) MCG/ACT IN AERS: 2.0000 | INHALATION_SPRAY | Freq: Four times a day (QID) | RESPIRATORY_TRACT | 12 refills | Status: DC | PRN

## 2016-04-16 MED ORDER — MIRTAZAPINE 15 MG PO TABS: 15.0000 mg | ORAL_TABLET | Freq: Every day | ORAL | 1 refills | Status: DC

## 2016-04-16 MED ORDER — ALBUTEROL SULFATE (2.5 MG/3ML) 0.083% IN NEBU: 2.5000 mg | INHALATION_SOLUTION | RESPIRATORY_TRACT | 3 refills | Status: DC | PRN

## 2016-04-16 MED ORDER — ESCITALOPRAM OXALATE 10 MG PO TABS: 10.0000 mg | ORAL_TABLET | Freq: Every day | ORAL | 1 refills | Status: DC

## 2016-04-16 MED ORDER — TRAMADOL HCL 50 MG PO TABS
50.0000 mg | ORAL_TABLET | Freq: Two times a day (BID) | ORAL | 0 refills | Status: DC
Start: 2016-04-16 — End: 2016-07-09

## 2016-04-16 MED ORDER — LYRICA 75 MG PO CAPS: 75.0000 mg | ORAL_CAPSULE | Freq: Two times a day (BID) | ORAL | 1 refills | Status: DC

## 2016-04-16 NOTE — Patient Instructions (Signed)
Follow up in 1 month for medication management 

## 2016-04-16 NOTE — Assessment & Plan Note (Signed)
Discussed with Dr. Laural BenesJohnson. Continue 10 mg lexapro as he has been taking this dosage and doing well. Recheck in 1 month

## 2016-04-16 NOTE — Progress Notes (Signed)
BP (!) 143/101   Pulse (!) 127   Temp 98.2 F (36.8 C)   Wt 184 lb (83.5 kg)   SpO2 90%   BMI 26.40 kg/m    Subjective:    Patient ID: Devin Bryant, male    DOB: 07-03-1955, 61 y.o.   MRN: [id number]  HPI: Devin Bryant is a 61 y.o. male  Chief Complaint  Patient presents with  . Elbow Pain    right x 6 days. No Known injury, doesn't hurt, just swollen.   Patient presents with 6 days of right elbow edema. Denies fever, pain, redness, or stiffness. No known injury, bites. This is the first time this has happened to him. Has not been trying anything at home for relief.   Taking 2 - 5 mg lexapro tabs daily and doing well at this dose. Wanting a script sent in reflecting that this is how much he is taking daily.   [facility] as his [facility] has [facility].   Patient notes that his pulse is always elevated, and that he just finished a nebulizer treatment before coming here. Feels completely well overall and is only concerned about the elbow.   Past Medical History:  Diagnosis Date  . Adjustment disorder with depressed mood   . Allergy   . Anxiety   . COPD (chronic obstructive pulmonary disease) (HCC)   . ED (erectile dysfunction)   . Elevated glucose   . Elevated serum glutamic pyruvic transaminase (SGPT) level   . Emphysema lung (HCC)   . GERD (gastroesophageal reflux disease)   . Hypertension   . Hypertriglyceridemia   . Liver disease   . Neuropathy (HCC)   . Tobacco use    Social History   Social History  . Marital status: Married    Spouse name: N/A  . Number of children: N/A  . Years of education: N/A   Occupational History  . Not on file.   Social History Main Topics  . Smoking status: Current Every Day Smoker    Packs/day: 1.00    Years: 40.00    Types: Cigarettes  . Smokeless tobacco: Never Used  . Alcohol use No     Comment: daughter states patient has been drinking heavily for the past three years with  increased drinking over the past 6 months.  . Drug use: No  . Sexual activity: Not on file   Other Topics Concern  . Not on file   Social History Narrative  . No narrative on file    Relevant past medical, surgical, family and social history reviewed and updated as indicated. Interim medical history since our last visit reviewed. Allergies and medications reviewed and updated.  Review of Systems  Constitutional: Negative.   HENT: Negative.   Respiratory: Negative.   Cardiovascular: Negative.   Gastrointestinal: Negative.   Genitourinary: Negative.   Musculoskeletal: Positive for joint swelling.  Neurological: Negative.   Psychiatric/Behavioral: Negative.     Per HPI unless specifically indicated above     Objective:    BP (!) 143/101   Pulse (!) 127   Temp 98.2 F (36.8 C)   Wt 184 lb (83.5 kg)   SpO2 90%   BMI 26.40 kg/m   Wt Readings from Last 3 Encounters:  04/16/16 184 lb (83.5 kg)  02/03/16 174 lb (78.9 kg)  01/30/16 180 lb 12.8 oz (82 kg)    Physical Exam  Constitutional: He is oriented to person, place, and time. He appears  well-developed and well-nourished.  HENT:  Head: Atraumatic.  Eyes: Conjunctivae are normal. Pupils are equal, round, and reactive to light. No scleral icterus.  Neck: Normal range of motion. Neck supple.  Cardiovascular: Normal rate and normal heart sounds.   Pulmonary/Chest: Effort normal and breath sounds normal. No respiratory distress.  Musculoskeletal: Normal range of motion. He exhibits edema (Right lateral elbow edema). He exhibits no tenderness.  Neurological: He is alert and oriented to person, place, and time.  Skin: Skin is warm and dry.  Psychiatric: He has a normal mood and affect. His behavior is normal.  Nursing note and vitals reviewed.     Assessment & Plan:   Problem List Items Addressed This Visit      Other   Anxiety and depression    Discussed with Dr. Laural Benes. Continue 10 mg lexapro as he has been  taking this dosage and doing well. Recheck in 1 month        Other Visit Diagnoses    Olecranon bursitis of right elbow    -  Primary   Used shared decision making. Since no discomfort, pt opts to wait for orthopedic visit for draining.    Relevant Orders   AMB referral to orthopedics       Follow up plan: Return in about 4 weeks (around 05/14/2016) for Medication management.

## 2016-04-18 ENCOUNTER — Ambulatory Visit: Payer: BLUE CROSS/BLUE SHIELD | Admitting: Family Medicine

## 2016-04-19 DIAGNOSIS — M25529 Pain in unspecified elbow: Secondary | ICD-10-CM | POA: Insufficient documentation

## 2016-04-19 DIAGNOSIS — M7020 Olecranon bursitis, unspecified elbow: Secondary | ICD-10-CM | POA: Insufficient documentation

## 2016-05-02 ENCOUNTER — Other Ambulatory Visit: Payer: Self-pay | Admitting: Family Medicine

## 2016-05-02 ENCOUNTER — Telehealth: Payer: Self-pay | Admitting: Family Medicine

## 2016-05-02 MED ORDER — DULOXETINE HCL 20 MG PO CPEP: ORAL_CAPSULE | ORAL | 3 refills | Status: DC

## 2016-05-02 NOTE — Telephone Encounter (Signed)
-----   Message from Marshall CorkKeri L Bullock, [place] sent at 05/02/2016  9:09 AM EST ----- Denied Lyrica. Must try Doulextine and Gabapentin first. (even though he's already tried Gabapentin)

## 2016-05-02 NOTE — Telephone Encounter (Signed)
Routing to provider. It looks like you are weaning him off of this.

## 2016-05-02 NOTE — Telephone Encounter (Signed)
Called to discuss lyrica denial. Will try cymbalta. Wean off lexapro by taking 1/2 tab for 1 week while starting cymbalta, then may increase 20mg  cymbalta to 2 caps daily after 2 weeks. He will call to make an appointment as neuropathy is getting worse.

## 2016-05-18 ENCOUNTER — Other Ambulatory Visit: Payer: Self-pay | Admitting: Family Medicine

## 2016-06-11 ENCOUNTER — Telehealth: Payer: Self-pay | Admitting: Family Medicine

## 2016-06-11 NOTE — Telephone Encounter (Signed)
Patient states that his insurance company stopped paying for his Lyrica prescription and made him switch to Cymbalta.  Patient states that the Cymbalta is not working and that he is experiencing neuropathy pain and severe itching since he has started the new medication.  Patient is requesting that a prior authorization and/or appeal be sent to his insurance company so that he is able to go back on Lyrica.  Patient would like someone to call him as soon as possible to advise.

## 2016-06-12 NOTE — Telephone Encounter (Signed)
Could you do this prior authorization? Please and thank you.

## 2016-06-12 NOTE — Telephone Encounter (Signed)
Routed to you and to [facility].

## 2016-06-12 NOTE — Telephone Encounter (Signed)
Prior authorization for Lyrica  has been approved.

## 2016-07-09 ENCOUNTER — Other Ambulatory Visit: Payer: Self-pay | Admitting: Family Medicine

## 2016-07-10 NOTE — Telephone Encounter (Signed)
Needs follow-up

## 2016-07-10 NOTE — Telephone Encounter (Signed)
Appointment scheduled for 07/17/16 @ 9:00am.

## 2016-07-17 ENCOUNTER — Ambulatory Visit: Payer: Self-pay | Admitting: Family Medicine

## 2016-07-27 ENCOUNTER — Ambulatory Visit: Payer: Self-pay | Admitting: Family Medicine

## 2016-07-27 ENCOUNTER — Telehealth: Payer: Self-pay

## 2016-07-27 NOTE — Telephone Encounter (Signed)
Patient called the call center and stated he'd like to talk to Dr. Laural BenesJohnson. Patient cancelled his appointment for today.   Patient stated that he isn't avoiding Dr. Laural BenesJohnson, he just has a lot going on right now and to apologize to her. Patient's Psorasis is bad at the moment. Patient did 2 doses of Humira and started [employer]. Patient also mentioned his tramadol that Dr. Laural BenesJohnson refilled his Rx for his month but he believes he was coming in so he could refill it next month.  Patient asked if I'd notify this to the provider and patient apologized again for cancelling.

## 2016-07-27 NOTE — Telephone Encounter (Signed)
No worries :)

## 2016-08-13 ENCOUNTER — Other Ambulatory Visit: Payer: Self-pay | Admitting: Family Medicine

## 2016-08-18 ENCOUNTER — Inpatient Hospital Stay
Admission: EM | Admit: 2016-08-18 | Discharge: 2016-08-20 | DRG: 603 | Disposition: A | Discharge status: Home / Self Care | Payer: BLUE CROSS/BLUE SHIELD | Attending: Internal Medicine | Admitting: Internal Medicine

## 2016-08-18 ENCOUNTER — Emergency Department: Payer: BLUE CROSS/BLUE SHIELD

## 2016-08-18 ENCOUNTER — Encounter: Payer: Self-pay | Admitting: Emergency Medicine

## 2016-08-18 DIAGNOSIS — Z8249 Family history of ischemic heart disease and other diseases of the circulatory system: Secondary | ICD-10-CM | POA: Diagnosis not present

## 2016-08-18 DIAGNOSIS — Z833 Family history of diabetes mellitus: Secondary | ICD-10-CM | POA: Diagnosis not present

## 2016-08-18 DIAGNOSIS — F4321 Adjustment disorder with depressed mood: Secondary | ICD-10-CM | POA: Diagnosis present

## 2016-08-18 DIAGNOSIS — Z7982 Long term (current) use of aspirin: Secondary | ICD-10-CM | POA: Diagnosis not present

## 2016-08-18 DIAGNOSIS — L03113 Cellulitis of right upper limb: Secondary | ICD-10-CM | POA: Diagnosis present

## 2016-08-18 DIAGNOSIS — F17210 Nicotine dependence, cigarettes, uncomplicated: Secondary | ICD-10-CM | POA: Diagnosis present

## 2016-08-18 DIAGNOSIS — L03114 Cellulitis of left upper limb: Secondary | ICD-10-CM | POA: Diagnosis not present

## 2016-08-18 DIAGNOSIS — F10239 Alcohol dependence with withdrawal, unspecified: Secondary | ICD-10-CM | POA: Diagnosis present

## 2016-08-18 DIAGNOSIS — Z79899 Other long term (current) drug therapy: Secondary | ICD-10-CM

## 2016-08-18 DIAGNOSIS — K7030 Alcoholic cirrhosis of liver without ascites: Secondary | ICD-10-CM | POA: Diagnosis present

## 2016-08-18 DIAGNOSIS — M79642 Pain in left hand: Secondary | ICD-10-CM | POA: Diagnosis present

## 2016-08-18 DIAGNOSIS — F10939 Alcohol use, unspecified with withdrawal, unspecified: Secondary | ICD-10-CM

## 2016-08-18 DIAGNOSIS — F1010 Alcohol abuse, uncomplicated: Secondary | ICD-10-CM

## 2016-08-18 DIAGNOSIS — K219 Gastro-esophageal reflux disease without esophagitis: Secondary | ICD-10-CM | POA: Diagnosis present

## 2016-08-18 DIAGNOSIS — E872 Acidosis, unspecified: Secondary | ICD-10-CM

## 2016-08-18 DIAGNOSIS — L409 Psoriasis, unspecified: Secondary | ICD-10-CM | POA: Diagnosis present

## 2016-08-18 DIAGNOSIS — J449 Chronic obstructive pulmonary disease, unspecified: Secondary | ICD-10-CM | POA: Diagnosis present

## 2016-08-18 DIAGNOSIS — I10 Essential (primary) hypertension: Secondary | ICD-10-CM | POA: Diagnosis present

## 2016-08-18 DIAGNOSIS — Z7951 Long term (current) use of inhaled steroids: Secondary | ICD-10-CM

## 2016-08-18 HISTORY — DX: Cellulitis of left upper limb: L03.114

## 2016-08-18 LAB — CBC WITH DIFFERENTIAL/PLATELET
BASOS ABS: 0 10*3/uL (ref 0–0.1)
BASOS PCT: 1 %
EOS ABS: 0.2 10*3/uL (ref 0–0.7)
Eosinophils Relative: 3 %
HCT: 39.7 % — ABNORMAL LOW (ref 40.0–52.0)
HEMOGLOBIN: 13.8 g/dL (ref 13.0–18.0)
LYMPHS ABS: 0.9 10*3/uL — AB (ref 1.0–3.6)
Lymphocytes Relative: 14 %
MCH: 37.1 pg — ABNORMAL HIGH (ref 26.0–34.0)
MCHC: 34.7 g/dL (ref 32.0–36.0)
MCV: 106.9 fL — ABNORMAL HIGH (ref 80.0–100.0)
Monocytes Absolute: 0.8 10*3/uL (ref 0.2–1.0)
Monocytes Relative: 12 %
NEUTROS PCT: 70 %
Neutro Abs: 4.6 10*3/uL (ref 1.4–6.5)
Platelets: 147 10*3/uL — ABNORMAL LOW (ref 150–440)
RBC: 3.71 MIL/uL — AB (ref 4.40–5.90)
RDW: 15.6 % — ABNORMAL HIGH (ref 11.5–14.5)
WBC: 6.5 10*3/uL (ref 3.8–10.6)

## 2016-08-18 LAB — COMPREHENSIVE METABOLIC PANEL
ALBUMIN: 3.9 g/dL (ref 3.5–5.0)
ALK PHOS: 111 U/L (ref 38–126)
ALT: 137 U/L — AB (ref 17–63)
AST: 237 U/L — AB (ref 15–41)
Anion gap: 15 (ref 5–15)
BUN: <5 mg/dL — ABNORMAL LOW (ref 6–20)
CALCIUM: 9.2 mg/dL (ref 8.9–10.3)
CHLORIDE: 97 mmol/L — AB (ref 101–111)
CO2: 25 mmol/L (ref 22–32)
CREATININE: 0.67 mg/dL (ref 0.61–1.24)
GFR calc Af Amer: >60 mL/min (ref >60)
GFR calc non Af Amer: >60 mL/min (ref >60)
GLUCOSE: 104 mg/dL — AB (ref 65–99)
Potassium: 3.7 mmol/L (ref 3.5–5.1)
SODIUM: 137 mmol/L (ref 135–145)
Total Bilirubin: 1.4 mg/dL — ABNORMAL HIGH (ref 0.3–1.2)
Total Protein: 7.5 g/dL (ref 6.5–8.1)

## 2016-08-18 LAB — PROTIME-INR
INR: 0.93
Prothrombin Time: 12.5 seconds (ref 11.4–15.2)

## 2016-08-18 LAB — AMMONIA: Ammonia: 23 umol/L (ref 9–35)

## 2016-08-18 LAB — LACTIC ACID, PLASMA: Lactic Acid, Venous: 3 mmol/L (ref 0.5–1.9)

## 2016-08-18 MED ORDER — DOCUSATE SODIUM 100 MG PO CAPS
100.0000 mg | ORAL_CAPSULE | Freq: Two times a day (BID) | ORAL | Status: DC
  Administered 2016-08-20: 100 mg via ORAL
  Filled 2016-08-18 (×3): qty 1

## 2016-08-18 MED ORDER — RIFAXIMIN 550 MG PO TABS
550.0000 mg | ORAL_TABLET | Freq: Two times a day (BID) | ORAL | Status: DC
  Administered 2016-08-18 – 2016-08-20 (×4): 550 mg via ORAL
  Filled 2016-08-18 (×4): qty 1

## 2016-08-18 MED ORDER — ALBUTEROL SULFATE (2.5 MG/3ML) 0.083% IN NEBU: 2.5000 mg | INHALATION_SOLUTION | RESPIRATORY_TRACT | Status: DC | PRN

## 2016-08-18 MED ORDER — ONDANSETRON HCL 4 MG/2ML IJ SOLN: 4.0000 mg | Freq: Four times a day (QID) | INTRAMUSCULAR | Status: DC | PRN

## 2016-08-18 MED ORDER — ONDANSETRON HCL 4 MG PO TABS: 4.0000 mg | ORAL_TABLET | Freq: Four times a day (QID) | ORAL | Status: DC | PRN

## 2016-08-18 MED ORDER — ESCITALOPRAM OXALATE 10 MG PO TABS
10.0000 mg | ORAL_TABLET | Freq: Every day | ORAL | Status: DC
  Administered 2016-08-19: 10 mg via ORAL
  Filled 2016-08-18: qty 1

## 2016-08-18 MED ORDER — CLINDAMYCIN PHOSPHATE 600 MG/50ML IV SOLN
600.0000 mg | Freq: Once | INTRAVENOUS | Status: AC
  Administered 2016-08-18: 600 mg via INTRAVENOUS
  Filled 2016-08-18: qty 50

## 2016-08-18 MED ORDER — FLUTICASONE PROPIONATE 50 MCG/ACT NA SUSP
1.0000 | Freq: Every day | NASAL | Status: DC
  Administered 2016-08-19 – 2016-08-20 (×2): 1 via NASAL
  Filled 2016-08-18: qty 16

## 2016-08-18 MED ORDER — LORAZEPAM 1 MG PO TABS
1.0000 mg | ORAL_TABLET | Freq: Once | ORAL | Status: AC
  Administered 2016-08-18: 1 mg via ORAL

## 2016-08-18 MED ORDER — THIAMINE HCL 100 MG/ML IJ SOLN
100.0000 mg | Freq: Every day | INTRAMUSCULAR | Status: DC
  Administered 2016-08-18 – 2016-08-20 (×3): 100 mg via INTRAVENOUS
  Filled 2016-08-18 (×3): qty 2

## 2016-08-18 MED ORDER — VANCOMYCIN HCL 10 G IV SOLR
1250.0000 mg | Freq: Two times a day (BID) | INTRAVENOUS | Status: DC
  Administered 2016-08-19 – 2016-08-20 (×3): 1250 mg via INTRAVENOUS
  Filled 2016-08-18 (×5): qty 1250

## 2016-08-18 MED ORDER — LORAZEPAM 2 MG PO TABS: 0.0000 mg | ORAL_TABLET | Freq: Two times a day (BID) | ORAL | Status: DC

## 2016-08-18 MED ORDER — ALBUTEROL SULFATE HFA 108 (90 BASE) MCG/ACT IN AERS: 2.0000 | INHALATION_SPRAY | Freq: Four times a day (QID) | RESPIRATORY_TRACT | Status: DC | PRN

## 2016-08-18 MED ORDER — MOMETASONE FURO-FORMOTEROL FUM 200-5 MCG/ACT IN AERO
2.0000 | INHALATION_SPRAY | Freq: Two times a day (BID) | RESPIRATORY_TRACT | Status: DC
  Administered 2016-08-18 – 2016-08-20 (×4): 2 via RESPIRATORY_TRACT
  Filled 2016-08-18: qty 8.8

## 2016-08-18 MED ORDER — VANCOMYCIN HCL 10 G IV SOLR
1500.0000 mg | Freq: Once | INTRAVENOUS | Status: AC
  Administered 2016-08-18: 1500 mg via INTRAVENOUS
  Filled 2016-08-18: qty 1500

## 2016-08-18 MED ORDER — LOPERAMIDE HCL 2 MG PO CAPS: 2.0000 mg | ORAL_CAPSULE | Freq: Four times a day (QID) | ORAL | Status: DC | PRN

## 2016-08-18 MED ORDER — LORAZEPAM 2 MG/ML IJ SOLN
INTRAMUSCULAR | Status: AC
  Filled 2016-08-18: qty 1

## 2016-08-18 MED ORDER — ENOXAPARIN SODIUM 40 MG/0.4ML ~~LOC~~ SOLN
40.0000 mg | SUBCUTANEOUS | Status: DC
  Administered 2016-08-18 – 2016-08-19 (×2): 40 mg via SUBCUTANEOUS
  Filled 2016-08-18 (×2): qty 0.4

## 2016-08-18 MED ORDER — LORATADINE 10 MG PO TABS
10.0000 mg | ORAL_TABLET | Freq: Every day | ORAL | Status: DC
  Administered 2016-08-19 – 2016-08-20 (×2): 10 mg via ORAL
  Filled 2016-08-18 (×2): qty 1

## 2016-08-18 MED ORDER — TRAMADOL HCL 50 MG PO TABS
50.0000 mg | ORAL_TABLET | Freq: Two times a day (BID) | ORAL | Status: DC
  Administered 2016-08-18 – 2016-08-19 (×3): 50 mg via ORAL
  Filled 2016-08-18 (×3): qty 1

## 2016-08-18 MED ORDER — VITAMIN B-1 100 MG PO TABS: 100.0000 mg | ORAL_TABLET | Freq: Every day | ORAL | Status: DC

## 2016-08-18 MED ORDER — LORAZEPAM 1 MG PO TABS
ORAL_TABLET | ORAL | Status: AC
  Filled 2016-08-18: qty 1

## 2016-08-18 MED ORDER — LORAZEPAM 2 MG PO TABS
0.0000 mg | ORAL_TABLET | Freq: Four times a day (QID) | ORAL | Status: AC
  Administered 2016-08-19 – 2016-08-20 (×6): 2 mg via ORAL
  Filled 2016-08-18 (×6): qty 1

## 2016-08-18 MED ORDER — THEOPHYLLINE ER 300 MG PO TB12
300.0000 mg | ORAL_TABLET | Freq: Two times a day (BID) | ORAL | Status: DC
  Administered 2016-08-19 (×3): 300 mg via ORAL
  Filled 2016-08-18 (×4): qty 1

## 2016-08-18 MED ORDER — ASPIRIN EC 81 MG PO TBEC
81.0000 mg | DELAYED_RELEASE_TABLET | Freq: Every day | ORAL | Status: DC
  Administered 2016-08-19 – 2016-08-20 (×2): 81 mg via ORAL
  Filled 2016-08-18 (×2): qty 1

## 2016-08-18 MED ORDER — LORAZEPAM 2 MG/ML IJ SOLN
INTRAMUSCULAR | Status: AC
  Administered 2016-08-18: 2 mg via INTRAVENOUS
  Filled 2016-08-18: qty 1

## 2016-08-18 MED ORDER — PREGABALIN 75 MG PO CAPS
75.0000 mg | ORAL_CAPSULE | Freq: Two times a day (BID) | ORAL | Status: DC
  Administered 2016-08-18 – 2016-08-20 (×4): 75 mg via ORAL
  Filled 2016-08-18 (×4): qty 1

## 2016-08-18 MED ORDER — LORAZEPAM 2 MG/ML IJ SOLN: 0.0000 mg | Freq: Two times a day (BID) | INTRAMUSCULAR | Status: DC

## 2016-08-18 MED ORDER — IPRATROPIUM-ALBUTEROL 0.5-2.5 (3) MG/3ML IN SOLN
3.0000 mL | Freq: Once | RESPIRATORY_TRACT | Status: AC
  Administered 2016-08-18: 3 mL via RESPIRATORY_TRACT
  Filled 2016-08-18: qty 3

## 2016-08-18 MED ORDER — CHLORDIAZEPOXIDE HCL 25 MG PO CAPS
25.0000 mg | ORAL_CAPSULE | Freq: Once | ORAL | Status: AC
  Administered 2016-08-18: 25 mg via ORAL
  Filled 2016-08-18: qty 1

## 2016-08-18 MED ORDER — ADULT MULTIVITAMIN W/MINERALS CH
1.0000 | ORAL_TABLET | Freq: Every day | ORAL | Status: DC
  Administered 2016-08-19 – 2016-08-20 (×2): 1 via ORAL
  Filled 2016-08-18 (×2): qty 1

## 2016-08-18 MED ORDER — NICOTINE 21 MG/24HR TD PT24
21.0000 mg | MEDICATED_PATCH | Freq: Every day | TRANSDERMAL | Status: DC
  Administered 2016-08-18 – 2016-08-20 (×3): 21 mg via TRANSDERMAL
  Filled 2016-08-18 (×3): qty 1

## 2016-08-18 MED ORDER — SPIRONOLACTONE 25 MG PO TABS
25.0000 mg | ORAL_TABLET | Freq: Every day | ORAL | Status: DC
  Administered 2016-08-19 – 2016-08-20 (×2): 25 mg via ORAL
  Filled 2016-08-18 (×2): qty 1

## 2016-08-18 MED ORDER — SODIUM CHLORIDE 0.9 % IV BOLUS (SEPSIS)
1000.0000 mL | Freq: Once | INTRAVENOUS | Status: AC
  Administered 2016-08-18: 1000 mL via INTRAVENOUS

## 2016-08-18 MED ORDER — SODIUM CHLORIDE 0.9 % IV SOLN
1000.0000 mL | INTRAVENOUS | Status: DC
  Administered 2016-08-18: 1000 mL via INTRAVENOUS

## 2016-08-18 MED ORDER — PANTOPRAZOLE SODIUM 40 MG PO TBEC
40.0000 mg | DELAYED_RELEASE_TABLET | Freq: Every day | ORAL | Status: DC
  Administered 2016-08-19 – 2016-08-20 (×2): 40 mg via ORAL
  Filled 2016-08-18 (×2): qty 1

## 2016-08-18 MED ORDER — THIAMINE HCL 100 MG/ML IJ SOLN
Freq: Once | INTRAVENOUS | Status: AC
  Administered 2016-08-18: 22:00:00 via INTRAVENOUS
  Filled 2016-08-18: qty 1000

## 2016-08-18 MED ORDER — TIOTROPIUM BROMIDE MONOHYDRATE 18 MCG IN CAPS
18.0000 ug | ORAL_CAPSULE | Freq: Every day | RESPIRATORY_TRACT | Status: DC
  Administered 2016-08-19 – 2016-08-20 (×2): 18 ug via RESPIRATORY_TRACT
  Filled 2016-08-18: qty 5

## 2016-08-18 MED ORDER — NYSTATIN 100000 UNIT/ML MT SUSP
5.0000 mL | Freq: Two times a day (BID) | OROMUCOSAL | Status: DC
  Administered 2016-08-18 – 2016-08-20 (×4): 500000 [IU] via ORAL
  Filled 2016-08-18 (×4): qty 5

## 2016-08-18 MED ORDER — LORAZEPAM 2 MG/ML IJ SOLN
0.0000 mg | Freq: Four times a day (QID) | INTRAMUSCULAR | Status: AC
Start: 2016-08-18 — End: 2016-08-20
  Administered 2016-08-18: 2 mg via INTRAVENOUS
  Administered 2016-08-18: 1 mg via INTRAVENOUS

## 2016-08-18 MED ORDER — SODIUM CHLORIDE 0.9 % IV BOLUS (SEPSIS)
500.0000 mL | Freq: Once | INTRAVENOUS | Status: AC
  Administered 2016-08-18: 500 mL via INTRAVENOUS

## 2016-08-18 MED ORDER — BACID PO TABS
2.0000 | ORAL_TABLET | Freq: Three times a day (TID) | ORAL | Status: DC
  Administered 2016-08-18 – 2016-08-19 (×4): 2 via ORAL
  Filled 2016-08-18 (×6): qty 2

## 2016-08-18 MED ORDER — MIRTAZAPINE 15 MG PO TABS
15.0000 mg | ORAL_TABLET | Freq: Every day | ORAL | Status: DC
  Administered 2016-08-18 – 2016-08-19 (×2): 15 mg via ORAL
  Filled 2016-08-18 (×2): qty 1

## 2016-08-18 NOTE — Progress Notes (Signed)
Pharmacy Antibiotic Note  Jules HusbandsDavid Gerding is a 61 y.o. male admitted on 08/18/2016 with cellulitis.  Pharmacy has been consulted for Vancomycin dosing. Patient received vancomycin 1500mg  IV x 1 dose in ED and clindamycin 600mg  IV x 1 dose,   Plan: Ke: 0.087   T1/2: 8  Vd: 57  Will start vancomycin 1250mg  IV every 12 hours, with 6 hours stack dosing. Calculated trough at Css is 14.8. Trough ordered prior to 4th dose. Will monitor renal function and adjust dose as needed.   Height: 5\' 10"  (177.8 cm) Weight: 180 lb (81.6 kg) IBW/kg (Calculated) : 73  Temp (24hrs), Avg:98.1 F (36.7 C), Min:98.1 F (36.7 C), Max:98.1 F (36.7 C)   Recent Labs Lab 08/18/16 1351 08/18/16 1803  WBC 6.5  --   CREATININE 0.67  --   LATICACIDVEN  --  3.0*    Estimated Creatinine Clearance: 97.6 mL/min (by C-G formula based on SCr of 0.67 mg/dL).    No Known Allergies  Antimicrobials this admission: 6/16 clindamycin 6/16 vancomycin >>   Dose adjustments this admission:   Microbiology results: 6/16 BCx: sent  Thank you for allowing pharmacy to be a part of this patient's care.  Gardner CandleSheema M Blima Jaimes, PharmD, BCPS Clinical Pharmacist 08/18/2016 8:00 PM

## 2016-08-18 NOTE — ED Provider Notes (Signed)
[facility] Emergency Department Provider Note    First MD Initiated Contact with Patient 08/18/16 1341     (approximate)  I have reviewed the triage vital signs and the nursing notes.   HISTORY  Chief Complaint Rash; Recurrent Skin Infections; and Alcohol Problem    HPI Devin Bryant is a 61 y.o. male with a history of psoriasis on immunologic's presents with worsening left arm swelling and pain with a sore is proximal to left elbow. Denies any fevers at home but states that the area has become acutely more swollen and warm to touch. States he has noted drainage from the sore. According to wife the patient has been drinking alcohol 24 7 for the past several days weeks. Does have a history of alcoholic hepatitis. Also has been having loose stool was nonbloody and non-melanotic. No shortness of breath.   Past Medical History:  Diagnosis Date  . Adjustment disorder with depressed mood   . Allergy   . Anxiety   . COPD (chronic obstructive pulmonary disease) (HCC)   . ED (erectile dysfunction)   . Elevated glucose   . Elevated serum glutamic pyruvic transaminase (SGPT) level   . Emphysema lung (HCC)   . GERD (gastroesophageal reflux disease)   . Hypertension   . Hypertriglyceridemia   . Liver disease   . Neuropathy   . Tobacco use    Family History  Problem Relation Age of Onset  . Diabetes Mother   . CAD Mother   . Cancer Father        lung  . Heart disease Father   . Heart attack Father   . COPD Sister   . Heart disease Brother   . Heart attack Brother   . COPD Brother   . Hypertension Neg Hx   . Stroke Neg Hx    Past Surgical History:  Procedure Laterality Date  . EYE SURGERY     cataract surgery  . KNEE SURGERY     arthroscopic  . SEPTOPLASTY  Feb 2016  . TONSILLECTOMY     Patient Active Problem List   Diagnosis Date Noted  . Acute on chronic respiratory failure with hypoxia (HCC) 02/03/2016  . Neuropathy 01/30/2016  . Acute  on chronic respiratory failure with hypoxia and hypercapnia (HCC) 01/17/2016  . Nonsustained ventricular tachycardia (HCC) 01/17/2016  . Leukocytosis 01/17/2016  . History of tobacco abuse 01/17/2016  . History of alcohol abuse 01/17/2016  . Liver cirrhosis, alcoholic (HCC) 12/08/2015  . Alcoholic hepatitis 10/10/2015  . Encephalopathy, hepatic (HCC) 10/10/2015  . Anxiety and depression 10/10/2015  . Hypokalemia 10/10/2015  . Psoriasis 10/10/2015  . Acute on chronic respiratory failure (HCC) 06/27/2015  . Macrocytosis without anemia 04/11/2015  . Alcoholism in recovery (HCC) 04/04/2015  . Tobacco abuse 10/19/2014  . Generalized anxiety disorder 10/17/2014  . Allergy   . Hypertension   . COPD (chronic obstructive pulmonary disease) (HCC)   . ED (erectile dysfunction)   . Hypertriglyceridemia   . Elevated glucose   . Elevated serum glutamic pyruvic transaminase (SGPT) level       Prior to Admission medications   Medication Sig Start Date End Date Taking? Authorizing Provider  ADVAIR HFA 115-21 MCG/ACT inhaler Inhale 2 puffs into the lungs 2 (two) times daily.  10/11/14  Yes [provider]  albuterol (PROVENTIL HFA;VENTOLIN HFA) 108 (90 Base) MCG/ACT inhaler Inhale 2 puffs into the lungs every 6 (six) hours as needed for wheezing or shortness of breath. 04/16/16  Yes Particia NearingLane, Rachel Elizabeth, PA-C  albuterol (PROVENTIL) (2.5 MG/3ML) 0.083% nebulizer solution Take 3 mLs (2.5 mg total) by nebulization every 4 (four) hours as needed for wheezing or shortness of breath. 04/16/16  Yes Particia NearingLane, Rachel Elizabeth, PA-C  Cetirizine HCl (ZYRTEC ALLERGY) 10 MG CAPS Take 10 mg by mouth daily.   Yes [provider]  escitalopram (LEXAPRO) 10 MG tablet Take 10 mg by mouth daily.   Yes [provider]  esomeprazole (NEXIUM) 20 MG capsule Take 20 mg by mouth daily at 12 noon.   Yes [provider]  fluticasone (FLONASE) 50 MCG/ACT nasal spray Place 1 spray into both  nostrils daily.  07/23/14  Yes [provider]  mirtazapine (REMERON) 15 MG tablet Take 1 tablet (15 mg total) by mouth at bedtime. 04/16/16  Yes Particia NearingLane, Rachel Elizabeth, PA-C  Multiple Vitamin (MULTIVITAMIN) tablet Take 1 tablet by mouth daily.   Yes [provider]  nystatin (MYCOSTATIN) 100000 UNIT/ML suspension Take 5 mLs by mouth 2 (two) times daily. Uses with his inhalers 02/08/15  Yes [provider]  pregabalin (LYRICA) 75 MG capsule Take 75 mg by mouth 2 (two) times daily. 11/08/15 11/07/16 Yes [provider]  rifaximin (XIFAXAN) 550 MG TABS tablet Take 1 tablet (550 mg total) by mouth 2 (two) times daily. 12/08/15  Yes Johnson, Megan P, DO  spironolactone (ALDACTONE) 25 MG tablet Take 1 tablet (25 mg total) by mouth daily. 12/08/15  Yes Johnson, Megan P, DO  theophylline (THEODUR) 300 MG 12 hr tablet Take 300 mg by mouth 2 (two) times daily. 03/02/15  Yes [provider]  tiotropium (SPIRIVA HANDIHALER) 18 MCG inhalation capsule Place 1 capsule (18 mcg total) into inhaler and inhale daily. 10/19/14  Yes Sudini, Wardell HeathSrikar, MD  traMADol (ULTRAM) 50 MG tablet TAKE 1 TABLET BY MOUTH TWICE DAILY 07/10/16  Yes Johnson, Megan P, DO  aspirin EC 81 MG EC tablet Take 1 tablet (81 mg total) by mouth daily. Patient not taking: Reported on 08/18/2016 01/18/16   Katharina CaperVaickute, Rima, MD  DULoxetine (CYMBALTA) 20 MG capsule Cut your lexapro in 1/2 for 1 week while you are taking your cymbalta to wean off. Take 1cap cymbalta for 2 weeks, then increase to 2 daily Patient not taking: Reported on 08/18/2016 05/02/16   Dorcas CarrowJohnson, Megan P, DO    Allergies Patient has no known allergies.    Social History Social History  Substance Use Topics  . Smoking status: Current Every Day Smoker    Packs/day: 1.00    Years: 40.00    Types: Cigarettes  . Smokeless tobacco: Never Used  . Alcohol use Yes     Comment: daughter states patient has been drinking heavily for the past three years  with increased drinking over the past 6 months.    Review of Systems Patient denies headaches, rhinorrhea, blurry vision, numbness, shortness of breath, chest pain, edema, cough, abdominal pain, nausea, vomiting, diarrhea, dysuria, fevers, rashes or hallucinations unless otherwise stated above in HPI. ____________________________________________   PHYSICAL EXAM:  VITAL SIGNS: Vitals:   08/18/16 1819 08/18/16 1830  BP: (!) 156/95 (!) 149/87  Pulse: (!) 108 (!) 103  Resp:  18  Temp:      Constitutional: Alert and oriented. Chronically ill appearing but in NAD. Eyes: Conjunctivae are normal.  Head: Atraumatic. Nose: No congestion/rhinnorhea. Mouth/Throat: Mucous membranes are moist.   Neck: No stridor. Painless ROM.  Cardiovascular: mildly tachycardic, regular rhythm. Grossly normal heart sounds.  Good peripheral circulation. Respiratory: Normal  respiratory effort.  No retractions. Lungs CTAB. Gastrointestinal: non tender hepatomegaly,  No fluid wave, normoactive bowelsounds Musculoskeletal: No lower extremity tenderness nor edema.  Non tender and non warm left elbow bursa. Painless ROM, no effusion Neurologic:  Normal speech and language. No gross focal neurologic deficits are appreciated. No facial droop Skin:  BUE psoriatic rash with 2cm ulceration and sore proximal to left elbow with + pitted edema to LUE and increased warmth Psychiatric: Mood and affect are normal. Speech and behavior are normal.  ____________________________________________   LABS (all labs ordered are listed, but only abnormal results are displayed)  Results for orders placed or performed during the hospital encounter of 08/18/16 (from the past 24 hour(s))  CBC with Differential/Platelet     Status: Abnormal   Collection Time: 08/18/16  1:51 PM  Result Value Ref Range   WBC 6.5 3.8 - 10.6 K/uL   RBC 3.71 (L) 4.40 - 5.90 MIL/uL   Hemoglobin 13.8 13.0 - 18.0 g/dL   HCT 16.1 (L) 09.6 - 04.5 %   MCV  106.9 (H) 80.0 - 100.0 fL   MCH 37.1 (H) 26.0 - 34.0 pg   MCHC 34.7 32.0 - 36.0 g/dL   RDW 40.9 (H) 81.1 - 91.4 %   Platelets 147 (L) 150 - 440 K/uL   Neutrophils Relative % 70 %   Neutro Abs 4.6 1.4 - 6.5 K/uL   Lymphocytes Relative 14 %   Lymphs Abs 0.9 (L) 1.0 - 3.6 K/uL   Monocytes Relative 12 %   Monocytes Absolute 0.8 0.2 - 1.0 K/uL   Eosinophils Relative 3 %   Eosinophils Absolute 0.2 0 - 0.7 K/uL   Basophils Relative 1 %   Basophils Absolute 0.0 0 - 0.1 K/uL  Comprehensive metabolic panel     Status: Abnormal   Collection Time: 08/18/16  1:51 PM  Result Value Ref Range   Sodium 137 135 - 145 mmol/L   Potassium 3.7 3.5 - 5.1 mmol/L   Chloride 97 (L) 101 - 111 mmol/L   CO2 25 22 - 32 mmol/L   Glucose, Bld 104 (H) 65 - 99 mg/dL   BUN <5 (L) 6 - 20 mg/dL   Creatinine, Ser 7.82 0.61 - 1.24 mg/dL   Calcium 9.2 8.9 - 95.6 mg/dL   Total Protein 7.5 6.5 - 8.1 g/dL   Albumin 3.9 3.5 - 5.0 g/dL   AST 213 (H) 15 - 41 U/L   ALT 137 (H) 17 - 63 U/L   Alkaline Phosphatase 111 38 - 126 U/L   Total Bilirubin 1.4 (H) 0.3 - 1.2 mg/dL   GFR calc non Af Amer >60 >60 mL/min   GFR calc Af Amer >60 >60 mL/min   Anion gap 15 5 - 15  Protime-INR     Status: None   Collection Time: 08/18/16  1:51 PM  Result Value Ref Range   Prothrombin Time 12.5 11.4 - 15.2 seconds   INR 0.93   Lactic acid, plasma     Status: Abnormal   Collection Time: 08/18/16  6:03 PM  Result Value Ref Range   Lactic Acid, Venous 3.0 (HH) 0.5 - 1.9 mmol/L  Ammonia     Status: None   Collection Time: 08/18/16  6:15 PM  Result Value Ref Range   Ammonia 23 9 - 35 umol/L   ____________________________________________  ___________________________________________  RADIOLOGY  I personally reviewed all radiographic images ordered to evaluate for the above acute complaints and reviewed radiology reports and findings.  These findings were personally discussed with the patient.  Please see medical record for radiology  report.  ____________________________________________   PROCEDURES  Procedure(s) performed:  Procedures    Critical Care performed: yes CRITICAL CARE Performed by: Willy Eddy   Total critical care time: 40 minutes  Critical care time was exclusive of separately billable procedures and treating other patients.  Critical care was necessary to treat or prevent imminent or life-threatening deterioration.  Critical care was time spent personally by me on the following activities: development of treatment plan with patient and/or surrogate as well as nursing, discussions with consultants, evaluation of patient's response to treatment, examination of patient, obtaining history from patient or surrogate, ordering and performing treatments and interventions, ordering and review of laboratory studies, ordering and review of radiographic studies, pulse oximetry and re-evaluation of patient's condition.  ____________________________________________   INITIAL IMPRESSION / ASSESSMENT AND PLAN / ED COURSE  Pertinent labs & imaging results that were available during my care of the patient were reviewed by me and considered in my medical decision making (see chart for details).  DDX: cellulitis, psoriatic flare, dvt, bursitis, etoh abuse,  Hepatitis, withdrawal  Britney Captain is a 61 y.o. who presents to the ED with multiple issues but primary reason for ER visit is pain and swelling to left upper extremity. Patient is ill-appearing but may be chronically so according to the wife. I am certainly concern for alcohol withdrawal syndrome given his tachycardia and significant alcohol abuse over the past several weeks. Patient is tremulous. No seizures at home. Left upper extremity does have evidence of cellulitis. He does have a sore bursa but does have overlying erythema that I'm concerned for infection. The bursa is nontender itself therefore do not feel that aspiration clinically indicated at  this time.  Patient is immunosuppressed disease on biologic similar require a dose of IV antibiotics. We'll further evaluate for any evidence of liver failure electrolyte abnormality.  Will provide IVF.  The patient will be placed on continuous pulse oximetry and telemetry for monitoring.  Laboratory evaluation will be sent to evaluate for the above complaints.     Clinical Course as of Aug 18 1945  Sat Aug 18, 2016  1610 Patient with persistent tachycardia and diffuse tremors. Patient unable to ambulate due to severe tremors at this point I am concern for withdrawal. She'll multiple medical issues going on at this point and given his history of psoriasis on biologic's with probable cellulitis and persistent alcohol abuse with signs of withdrawal requiring multiple doses of benzodiazepines or do feel the patient will require admission to the hospital.  [PR]    Clinical Course User Index [PR] Willy Eddy, MD     ____________________________________________   FINAL CLINICAL IMPRESSION(S) / ED DIAGNOSES  Final diagnoses:  Alcohol abuse  Alcohol withdrawal syndrome with complication (HCC)  Cellulitis of left upper extremity  Lactic acidosis      NEW MEDICATIONS STARTED DURING THIS VISIT:  New Prescriptions   No medications on file     Note:  This document was prepared using Dragon voice recognition software and may include unintentional dictation errors.    Willy Eddy, MD 08/18/16 1949

## 2016-08-18 NOTE — H&P (Signed)
[facility] [place] [facility]   PATIENT NAME: Devin Bryant    MR#:  [id number]  DATE OF BIRTH:  1956-01-27  DATE OF ADMISSION:  08/18/2016  PRIMARY CARE PHYSICIAN: Dorcas Carrow, DO   REQUESTING/REFERRING PHYSICIAN: Roxan Hockey  CHIEF COMPLAINT:   Redness of the hands HISTORY OF PRESENT ILLNESS:  Devin Bryant  is a 61 y.o. 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 Patient also admits that he drinks 4-5 drinks of wine every day , last drink was today morning. Patient is jittery and anxious today. Patient was given 1 dose of IV clindamycin and vancomycin and hospitalist team is called to admit the patient. DVT ruled out  PAST MEDICAL HISTORY:   Past Medical History:  Diagnosis Date  . Adjustment disorder with depressed mood   . Allergy   . Anxiety   . COPD (chronic obstructive pulmonary disease) (HCC)   . ED (erectile dysfunction)   . Elevated glucose   . Elevated serum glutamic pyruvic transaminase (SGPT) level   . Emphysema lung (HCC)   . GERD (gastroesophageal reflux disease)   . Hypertension   . Hypertriglyceridemia   . Liver disease   . Neuropathy   . Tobacco use     PAST SURGICAL HISTOIRY:   Past Surgical History:  Procedure Laterality Date  . EYE SURGERY     cataract surgery  . KNEE SURGERY     arthroscopic  . SEPTOPLASTY  Feb 2016  . TONSILLECTOMY      SOCIAL HISTORY:   Social History  Substance Use Topics  . Smoking status: Current Every Day Smoker    Packs/day: 1.00    Years: 40.00    Types: Cigarettes  . Smokeless tobacco: Never Used  . Alcohol use Yes     Comment: daughter states patient has been drinking heavily for the past three years with increased drinking over the past 6 months.    FAMILY HISTORY:    Family History  Problem Relation Age of Onset  . Diabetes Mother   . CAD Mother   . Cancer Father        lung  . Heart disease Father   . Heart attack Father   . COPD Sister   . Heart disease Brother   . Heart attack Brother   . COPD Brother   . Hypertension Neg Hx   . Stroke Neg Hx     DRUG ALLERGIES:  No Known Allergies  REVIEW OF SYSTEMS:  CONSTITUTIONAL: No fever, fatigue or weakness.  EYES: No blurred or double vision.  EARS, NOSE, AND THROAT: No tinnitus or ear pain.  RESPIRATORY: No cough, shortness of breath, wheezing or hemoptysis.  CARDIOVASCULAR: No chest pain, orthopnea, edema.  GASTROINTESTINAL: No nausea, vomiting, diarrhea or abdominal pain.  GENITOURINARY: No dysuria, hematuria.  ENDOCRINE: No polyuria, nocturia,  HEMATOLOGY: No anemia, easy bruising or bleeding SKIN: Both hands are red and swollen, chronic psoriatic patches on the palmar aspect MUSCULOSKELETAL: No joint pain or arthritis.   NEUROLOGIC: No tingling, numbness, weakness.  PSYCHIATRY:Jittery and anxious  MEDICATIONS AT HOME:   Prior to Admission medications   Medication Sig Start Date End Date Taking? Authorizing Provider  ADVAIR HFA 115-21 MCG/ACT inhaler Inhale 2 puffs into the lungs 2 (two) times  daily.  10/11/14  Yes [provider]  albuterol (PROVENTIL HFA;VENTOLIN HFA) 108 (90 Base) MCG/ACT inhaler Inhale 2 puffs into the lungs every 6 (six) hours as needed for wheezing or shortness of breath. 04/16/16  Yes Particia NearingLane, Rachel Elizabeth, PA-C  albuterol (PROVENTIL) (2.5 MG/3ML) 0.083% nebulizer solution Take 3 mLs (2.5 mg total) by nebulization every 4 (four) hours as needed for wheezing or shortness of breath. 04/16/16  Yes Particia NearingLane, Rachel Elizabeth, PA-C  Cetirizine HCl (ZYRTEC ALLERGY) 10 MG CAPS Take 10 mg by mouth daily.   Yes [provider]  escitalopram (LEXAPRO) 10 MG tablet Take 10 mg by mouth daily.   Yes [provider]  esomeprazole (NEXIUM) 20 MG capsule  Take 20 mg by mouth daily at 12 noon.   Yes [provider]  fluticasone (FLONASE) 50 MCG/ACT nasal spray Place 1 spray into both nostrils daily.  07/23/14  Yes [provider]  mirtazapine (REMERON) 15 MG tablet Take 1 tablet (15 mg total) by mouth at bedtime. 04/16/16  Yes Particia NearingLane, Rachel Elizabeth, PA-C  Multiple Vitamin (MULTIVITAMIN) tablet Take 1 tablet by mouth daily.   Yes [provider]  nystatin (MYCOSTATIN) 100000 UNIT/ML suspension Take 5 mLs by mouth 2 (two) times daily. Uses with his inhalers 02/08/15  Yes [provider]  pregabalin (LYRICA) 75 MG capsule Take 75 mg by mouth 2 (two) times daily. 11/08/15 11/07/16 Yes [provider]  rifaximin (XIFAXAN) 550 MG TABS tablet Take 1 tablet (550 mg total) by mouth 2 (two) times daily. 12/08/15  Yes Johnson, Megan P, DO  spironolactone (ALDACTONE) 25 MG tablet Take 1 tablet (25 mg total) by mouth daily. 12/08/15  Yes Johnson, Megan P, DO  theophylline (THEODUR) 300 MG 12 hr tablet Take 300 mg by mouth 2 (two) times daily. 03/02/15  Yes [provider]  tiotropium (SPIRIVA HANDIHALER) 18 MCG inhalation capsule Place 1 capsule (18 mcg total) into inhaler and inhale daily. 10/19/14  Yes Sudini, Wardell HeathSrikar, MD  traMADol (ULTRAM) 50 MG tablet TAKE 1 TABLET BY MOUTH TWICE DAILY 07/10/16  Yes Johnson, Megan P, DO  aspirin EC 81 MG EC tablet Take 1 tablet (81 mg total) by mouth daily. Patient not taking: Reported on 08/18/2016 01/18/16   Katharina CaperVaickute, Rima, MD  DULoxetine (CYMBALTA) 20 MG capsule Cut your lexapro in 1/2 for 1 week while you are taking your cymbalta to wean off. Take 1cap cymbalta for 2 weeks, then increase to 2 daily Patient not taking: Reported on 08/18/2016 05/02/16   Olevia PerchesJohnson, Megan P, DO      VITAL SIGNS:  Blood pressure (!) 149/87, pulse (!) 103, temperature 98.1 F (36.7 C), temperature source Oral, resp. rate 18, height 5\' 10"  (1.778 m), weight 81.6 kg (180 lb), SpO2 96 %.  PHYSICAL  EXAMINATION:  GENERAL:  61 y.o.-year-old patient lying in the bed with no acute distress.  EYES: Pupils equal, round, reactive to light and accommodation. No scleral icterus. Extraocular muscles intact.  HEENT: Head atraumatic, normocephalic. Oropharynx and nasopharynx clear.  NECK:  Supple, no jugular venous distention. No thyroid enlargement, no tenderness.  LUNGS: Normal breath sounds bilaterally, no wheezing, rales,rhonchi or crepitation. No use of accessory muscles of respiration.  CARDIOVASCULAR: S1, S2 normal. No murmurs, rubs, or gallops.  ABDOMEN: Soft, nontender, nondistended. Bowel sounds present. No organomegaly or mass.  EXTREMITIES: No pedal edema, cyanosis, or clubbing.  NEUROLOGIC: Cranial nerves II through XII are intact. Muscle strength 5/5 in all extremities. Sensation intact. Gait not checked.  PSYCHIATRIC: The patient is alert and oriented x 3. Anxious and jittery SKIN: Bilateral upper extremities forearm area is erythematous and edematous with Plaques  Chronic psoriatic patches on bilateral palmar aspect  LABORATORY PANEL:   CBC  Recent Labs Lab 08/18/16 1351  WBC 6.5  HGB 13.8  HCT 39.7*  PLT 147*   ------------------------------------------------------------------------------------------------------------------  Chemistries   Recent Labs Lab 08/18/16 1351  NA 137  K 3.7  CL 97*  CO2 25  GLUCOSE 104*  BUN <5*  CREATININE 0.67  CALCIUM 9.2  AST 237*  ALT 137*  ALKPHOS 111  BILITOT 1.4*   ------------------------------------------------------------------------------------------------------------------  Cardiac Enzymes No results for input(s): TROPONINI in the last 168 hours. ------------------------------------------------------------------------------------------------------------------  RADIOLOGY:  Dg Chest 2 View  Result Date: 08/18/2016 CLINICAL DATA:  Tachycardia, COPD EXAM: CHEST  2 VIEW COMPARISON:  02/03/2016 chest radiograph.  FINDINGS: Stable cardiomediastinal silhouette with normal heart size and aortic atherosclerosis. No pneumothorax. No pleural effusion. Mildly hyperinflated lungs. No pulmonary edema. No acute consolidative airspace disease. IMPRESSION: Hyperinflated lungs, compatible with the provided history of COPD. Otherwise no active disease in the chest. Electronically Signed   By: Delbert Phenix M.D.   On: 08/18/2016 15:56   US Venous Img Upper Uni Left  Result Date: 08/18/2016 CLINICAL DATA:  Swelling and pain of the left upper extremity. EXAM: LEFT UPPER EXTREMITY VENOUS DOPPLER ULTRASOUND TECHNIQUE: Gray-scale sonography with graded compression, as well as color Doppler and duplex ultrasound were performed to evaluate the upper extremity deep venous system from the level of the subclavian vein and including the jugular, axillary, basilic, radial, ulnar and upper cephalic vein. Spectral Doppler was utilized to evaluate flow at rest and with distal augmentation maneuvers. COMPARISON:  None. FINDINGS: Contralateral Subclavian Vein: Respiratory phasicity is normal and symmetric with the symptomatic side. No evidence of thrombus. Normal compressibility. Internal Jugular Vein: No evidence of thrombus. Normal compressibility, respiratory phasicity and response to augmentation. Subclavian Vein: No evidence of thrombus. Normal compressibility, respiratory phasicity and response to augmentation. Axillary Vein: No evidence of thrombus. Normal compressibility, respiratory phasicity and response to augmentation. Cephalic Vein: No evidence of thrombus. Normal compressibility, respiratory phasicity and response to augmentation. Basilic Vein: No evidence of thrombus. Normal compressibility, respiratory phasicity and response to augmentation. Brachial Veins: No evidence of thrombus. Normal compressibility, respiratory phasicity and response to augmentation. Radial Veins: No evidence of thrombus. Normal compressibility, respiratory  phasicity and response to augmentation. Ulnar Veins: No evidence of thrombus. Normal compressibility, respiratory phasicity and response to augmentation. Venous Reflux:  None visualized. Other Findings:  None visualized. IMPRESSION: No evidence of DVT within the left  upper extremity. Electronically Signed   By: Ted Mcalpine M.D.   On: 08/18/2016 15:05   US Abdomen Limited Ruq  Result Date: 08/18/2016 CLINICAL DATA:  Mild coal abuse.  Elevated LFTs. EXAM: ULTRASOUND ABDOMEN LIMITED RIGHT UPPER QUADRANT COMPARISON:  MRI of the abdomen 10/06/2015 FINDINGS: Gallbladder: No gallstones or wall thickening visualized. No sonographic Murphy sign noted by sonographer. Common bile duct: Diameter: 4.8 mm Liver: No focal lesion identified. Diffusely heterogeneous increased echogenicity. Normal direction of flow of the main portal vein. IMPRESSION: Heterogeneous diffusely increased echogenicity of the liver, which may be seen with hepatic cirrhosis or fibrosis. No evidence of cholelithiasis. Electronically Signed   By: Ted Mcalpine M.D.   On: 08/18/2016 17:43    EKG:   Orders placed or performed during the hospital encounter of 02/03/16  . ED EKG  . ED EKG  . EKG 12-Lead  .  EKG 12-Lead    IMPRESSION AND PLAN:   Jann Ra  is a 61 y.o. 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 Patient also admits that he drinks 4-5 drinks of wine every day , last drink was today morning. Patient is jittery and anxious today.  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 Counseled patient to quit smoking for 5 minutes. He verbalized understanding will provide 21 mg of nicotine patch    All the records are reviewed and case discussed with ED provider. Management plans discussed with the patient, family and they are in agreement.  CODE STATUS: fc , wife HCPOA  TOTAL TIME TAKING CARE OF THIS PATIENT: 45  minutes.   Note: This dictation was prepared with Dragon dictation along with smaller phrase technology. Any transcriptional errors that result from this process are unintentional.  Ramonita Lab M.D on 08/18/2016 at 7:57 PM  Between 7am to 6pm - Pager - [phone]  After 6pm go to [email or web address] - password [email or web address] [facility]  Office  [phone]  CC: Primary care physician; Dorcas Carrow, DO

## 2016-08-18 NOTE — ED Triage Notes (Signed)
Pt presents to the ER accompanied by wife with complaints of left arm swelling, redness and non-healing sore, pt reports history of psoriasis reports started new medication about a month ago. Reports he drinks alcohol 24/7 last intake 30 minutes prior to arrival. Visible swelling to left arm. Rash noted to legs. Reports liver problems

## 2016-08-18 NOTE — ED Notes (Signed)
Pt ambulated in room on cont. pulse ox. O2 sat remained 94-96%, however pt's having moderate to severe tremors while ambulating. Pt ambulated in room only instead of hallway d/t risk of fall. EDP updated.

## 2016-08-18 NOTE — ED Notes (Signed)
Pt placed on 2L n/c for O2 sat 90% while falling asleep. Pt states he normally wears O2 @ 2lpm for sleep at home.

## 2016-08-19 LAB — COMPREHENSIVE METABOLIC PANEL
ALBUMIN: 3.2 g/dL — AB (ref 3.5–5.0)
ALT: 112 U/L — AB (ref 17–63)
AST: 197 U/L — AB (ref 15–41)
Alkaline Phosphatase: 96 U/L (ref 38–126)
Anion gap: 9 (ref 5–15)
BILIRUBIN TOTAL: 2.2 mg/dL — AB (ref 0.3–1.2)
CHLORIDE: 102 mmol/L (ref 101–111)
CO2: 27 mmol/L (ref 22–32)
CREATININE: 0.59 mg/dL — AB (ref 0.61–1.24)
Calcium: 8.4 mg/dL — ABNORMAL LOW (ref 8.9–10.3)
GFR calc Af Amer: >60 mL/min (ref >60)
GLUCOSE: 115 mg/dL — AB (ref 65–99)
POTASSIUM: 3.6 mmol/L (ref 3.5–5.1)
Sodium: 138 mmol/L (ref 135–145)
Total Protein: 6.3 g/dL — ABNORMAL LOW (ref 6.5–8.1)

## 2016-08-19 LAB — CBC
HEMATOCRIT: 35.8 % — AB (ref 40.0–52.0)
Hemoglobin: 12.5 g/dL — ABNORMAL LOW (ref 13.0–18.0)
MCH: 37 pg — ABNORMAL HIGH (ref 26.0–34.0)
MCHC: 34.9 g/dL (ref 32.0–36.0)
MCV: 105.9 fL — AB (ref 80.0–100.0)
PLATELETS: 127 10*3/uL — AB (ref 150–440)
RBC: 3.38 MIL/uL — ABNORMAL LOW (ref 4.40–5.90)
RDW: 15.4 % — AB (ref 11.5–14.5)
WBC: 7.1 10*3/uL (ref 3.8–10.6)

## 2016-08-19 LAB — TSH: TSH: 0.663 u[IU]/mL (ref 0.350–4.500)

## 2016-08-19 LAB — MRSA PCR SCREENING: MRSA by PCR: NEGATIVE

## 2016-08-19 LAB — LACTIC ACID, PLASMA: LACTIC ACID, VENOUS: 1.9 mmol/L (ref 0.5–1.9)

## 2016-08-19 MED ORDER — SODIUM CHLORIDE 0.9 % IV SOLN
3.0000 g | Freq: Four times a day (QID) | INTRAVENOUS | Status: DC
  Administered 2016-08-19 – 2016-08-20 (×3): 3 g via INTRAVENOUS
  Filled 2016-08-19 (×7): qty 3

## 2016-08-19 MED ORDER — FUROSEMIDE 10 MG/ML IJ SOLN
20.0000 mg | Freq: Two times a day (BID) | INTRAMUSCULAR | Status: DC
Start: 2016-08-19 — End: 2016-08-20
  Administered 2016-08-19 – 2016-08-20 (×2): 20 mg via INTRAVENOUS
  Filled 2016-08-19 (×2): qty 2

## 2016-08-19 MED ORDER — AMMONIUM LACTATE 12 % EX LOTN
TOPICAL_LOTION | Freq: Two times a day (BID) | CUTANEOUS | Status: DC
  Administered 2016-08-19 (×2): via TOPICAL
  Filled 2016-08-19: qty 400

## 2016-08-19 NOTE — Progress Notes (Signed)
[facility] [place] [facility]                                                                                                                                                                                  Patient Demographics   Devin Bryant, is a 61 y.o. male, DOB - 07/11/1955, ZOX:[id number]  Admit date - 08/18/2016   Admitting Physician Ramonita Lab, MD  Outpatient Primary MD for the patient is Dorcas Carrow, DO   LOS - 1  Subjective: Patient admitted with bilateral upper extremity erythema. He has severe psoriasis recently started on Humira    Review of Systems:   CONSTITUTIONAL: No documented fever. No fatigue, weakness. No weight gain, no weight loss.  EYES: No blurry or double vision.  ENT: No tinnitus. No postnasal drip. No redness of the oropharynx.  RESPIRATORY: No cough, no wheeze, no hemoptysis. No dyspnea.  CARDIOVASCULAR: No chest pain. No orthopnea. No palpitations. No syncope.  GASTROINTESTINAL: No nausea, no vomiting or diarrhea. No abdominal pain. No melena or hematochezia.  GENITOURINARY: No dysuria or hematuria.  ENDOCRINE: No polyuria or nocturia. No heat or cold intolerance.  HEMATOLOGY: No anemia. No bruising. No bleeding.  INTEGUMENTARYBilateral redness of the upper extremity as well as scaly rash all over his body.  MUSCULOSKELETAL: No arthritis. No swelling. No gout.  NEUROLOGIC: No numbness, tingling, or ataxia. No seizure-type activity.  PSYCHIATRIC: No anxiety. No insomnia. No ADD.    Vitals:   Vitals:   08/18/16 2137 08/19/16 0004 08/19/16 0737 08/19/16 1343  BP: (!) 160/81 139/64 (!) 159/79 138/72  Pulse: (!) 111 (!) 112 (!) 105 (!) 113  Resp: (!) 22 20 19    Temp: 98.1 F (36.7 C) 98.6 F (37 C) 98.8 F (37.1 C) 98.1 F (36.7 C)  TempSrc: Oral Oral Oral Oral  SpO2: 94% 98% 92% 96%  Weight:      Height:        Wt Readings from Last 3 Encounters:  08/18/16 180 lb (81.6 kg)  04/16/16 184 lb (83.5 kg)  02/03/16  174 lb (78.9 kg)     Intake/Output Summary (Last 24 hours) at 08/19/16 1348 Last data filed at 08/19/16 0800  Gross per 24 hour  Intake             1595 ml  Output                0 ml  Net             1595 ml    Physical Exam:   GENERAL: Pleasant-appearing in no apparent distress.  HEAD, EYES, EARS, NOSE AND THROAT: Atraumatic, normocephalic. Extraocular muscles are  intact. Pupils equal and reactive to light. Sclerae anicteric. No conjunctival injection. No oro-pharyngeal erythema.  NECK: Supple. There is no jugular venous distention. No bruits, no lymphadenopathy, no thyromegaly.  HEART: Regular rate and rhythm,. No murmurs, no rubs, no clicks.  LUNGS: Clear to auscultation bilaterally. No rales or rhonchi. No wheezes.  ABDOMEN: Soft, flat, nontender, nondistended. Has good bowel sounds. No hepatosplenomegaly appreciated.  EXTREMITIES: Bilateral hand and arm erythema  NEUROLOGIC: The patient is alert, awake, and oriented x3 with no focal motor or sensory deficits appreciated bilaterally.  SKIN: Moist and warm with psoriatic rash noted throughout his body Psych: Tremulous LN: No inguinal LN enlargement    Antibiotics   Anti-infectives    Start     Dose/Rate Route Frequency Ordered Stop   08/19/16 1400  Ampicillin-Sulbactam (UNASYN) 3 g in sodium chloride 0.9 % 100 mL IVPB     3 g 200 mL/hr over 30 Minutes Intravenous Every 6 hours 08/19/16 1252     08/19/16 0200  vancomycin (VANCOCIN) 1,250 mg in sodium chloride 0.9 % 250 mL IVPB     1,250 mg 166.7 mL/hr over 90 Minutes Intravenous Every 12 hours 08/18/16 2012     08/18/16 2200  rifaximin (XIFAXAN) tablet 550 mg     550 mg Oral 2 times daily 08/18/16 2127     08/18/16 1930  vancomycin (VANCOCIN) 1,500 mg in sodium chloride 0.9 % 500 mL IVPB     1,500 mg 250 mL/hr over 120 Minutes Intravenous  Once 08/18/16 1915 08/18/16 2201   08/18/16 1430  clindamycin (CLEOCIN) IVPB 600 mg     600 mg 100 mL/hr over 30 Minutes Intravenous   Once 08/18/16 1418 08/18/16 1516      Medications   Scheduled Meds: . ammonium lactate   Topical BID  . aspirin EC  81 mg Oral Daily  . docusate sodium  100 mg Oral BID  . enoxaparin (LOVENOX) injection  40 mg Subcutaneous Q24H  . escitalopram  10 mg Oral Daily  . fluticasone  1 spray Each Nare Daily  . lactobacillus acidophilus  2 tablet Oral TID  . loratadine  10 mg Oral Daily  . LORazepam  0-4 mg Intravenous Q6H   Or  . LORazepam  0-4 mg Oral Q6H  . [START ON 08/20/2016] LORazepam  0-4 mg Intravenous Q12H   Or  . [START ON 08/20/2016] LORazepam  0-4 mg Oral Q12H  . mirtazapine  15 mg Oral QHS  . mometasone-formoterol  2 puff Inhalation BID  . multivitamin with minerals  1 tablet Oral Daily  . nicotine  21 mg Transdermal Daily  . nystatin  5 mL Oral BID  . pantoprazole  40 mg Oral Daily  . pregabalin  75 mg Oral BID  . rifaximin  550 mg Oral BID  . spironolactone  25 mg Oral Daily  . theophylline  300 mg Oral BID  . thiamine  100 mg Intravenous Daily  . tiotropium  18 mcg Inhalation Daily  . traMADol  50 mg Oral BID   Continuous Infusions: . ampicillin-sulbactam (UNASYN) IV    . vancomycin Stopped (08/19/16 0325)   PRN Meds:.albuterol, loperamide, ondansetron **OR** ondansetron (ZOFRAN) IV   Data Review:   Micro Results Recent Results (from the past 240 hour(s))  Blood culture (routine x 2)     Status: None (Preliminary result)   Collection Time: 08/18/16  7:00 PM  Result Value Ref Range Status   Specimen Description BLOOD RIGHT ANTECUBITAL  Final  Special Requests   Final    BOTTLES DRAWN AEROBIC AND ANAEROBIC Blood Culture adequate volume   Culture NO GROWTH < 24 HOURS  Final   Report Status PENDING  Incomplete  Blood culture (routine x 2)     Status: None (Preliminary result)   Collection Time: 08/18/16  7:41 PM  Result Value Ref Range Status   Specimen Description BLOOD RIGHT ANTECUBITAL  Final   Special Requests   Final    BOTTLES DRAWN AEROBIC AND  ANAEROBIC Blood Culture adequate volume   Culture NO GROWTH < 24 HOURS  Final   Report Status PENDING  Incomplete  MRSA PCR Screening     Status: None   Collection Time: 08/19/16  6:34 AM  Result Value Ref Range Status   MRSA by PCR NEGATIVE NEGATIVE Final    Comment:        The GeneXpert MRSA Assay (FDA approved for NASAL specimens only), is one component of a comprehensive MRSA colonization surveillance program. It is not intended to diagnose MRSA infection nor to guide or monitor treatment for MRSA infections.     Radiology Reports Dg Chest 2 View  Result Date: 08/18/2016 CLINICAL DATA:  Tachycardia, COPD EXAM: CHEST  2 VIEW COMPARISON:  02/03/2016 chest radiograph. FINDINGS: Stable cardiomediastinal silhouette with normal heart size and aortic atherosclerosis. No pneumothorax. No pleural effusion. Mildly hyperinflated lungs. No pulmonary edema. No acute consolidative airspace disease. IMPRESSION: Hyperinflated lungs, compatible with the provided history of COPD. Otherwise no active disease in the chest. Electronically Signed   By: Delbert Phenix M.D.   On: 08/18/2016 15:56   US Venous Img Upper Uni Left  Result Date: 08/18/2016 CLINICAL DATA:  Swelling and pain of the left upper extremity. EXAM: LEFT UPPER EXTREMITY VENOUS DOPPLER ULTRASOUND TECHNIQUE: Gray-scale sonography with graded compression, as well as color Doppler and duplex ultrasound were performed to evaluate the upper extremity deep venous system from the level of the subclavian vein and including the jugular, axillary, basilic, radial, ulnar and upper cephalic vein. Spectral Doppler was utilized to evaluate flow at rest and with distal augmentation maneuvers. COMPARISON:  None. FINDINGS: Contralateral Subclavian Vein: Respiratory phasicity is normal and symmetric with the symptomatic side. No evidence of thrombus. Normal compressibility. Internal Jugular Vein: No evidence of thrombus. Normal compressibility, respiratory  phasicity and response to augmentation. Subclavian Vein: No evidence of thrombus. Normal compressibility, respiratory phasicity and response to augmentation. Axillary Vein: No evidence of thrombus. Normal compressibility, respiratory phasicity and response to augmentation. Cephalic Vein: No evidence of thrombus. Normal compressibility, respiratory phasicity and response to augmentation. Basilic Vein: No evidence of thrombus. Normal compressibility, respiratory phasicity and response to augmentation. Brachial Veins: No evidence of thrombus. Normal compressibility, respiratory phasicity and response to augmentation. Radial Veins: No evidence of thrombus. Normal compressibility, respiratory phasicity and response to augmentation. Ulnar Veins: No evidence of thrombus. Normal compressibility, respiratory phasicity and response to augmentation. Venous Reflux:  None visualized. Other Findings:  None visualized. IMPRESSION: No evidence of DVT within the left  upper extremity. Electronically Signed   By: Ted Mcalpine M.D.   On: 08/18/2016 15:05   US Abdomen Limited Ruq  Result Date: 08/18/2016 CLINICAL DATA:  Mild coal abuse.  Elevated LFTs. EXAM: ULTRASOUND ABDOMEN LIMITED RIGHT UPPER QUADRANT COMPARISON:  MRI of the abdomen 10/06/2015 FINDINGS: Gallbladder: No gallstones or wall thickening visualized. No sonographic Murphy sign noted by sonographer. Common bile duct: Diameter: 4.8 mm Liver: No focal lesion identified. Diffusely heterogeneous increased echogenicity. Normal direction of  flow of the main portal vein. IMPRESSION: Heterogeneous diffusely increased echogenicity of the liver, which may be seen with hepatic cirrhosis or fibrosis. No evidence of cholelithiasis. Electronically Signed   By: Ted Mcalpine M.D.   On: 08/18/2016 17:43     CBC  Recent Labs Lab 08/18/16 1351 08/18/16 2348  WBC 6.5 7.1  HGB 13.8 12.5*  HCT 39.7* 35.8*  PLT 147* 127*  MCV 106.9* 105.9*  MCH 37.1* 37.0*  MCHC  34.7 34.9  RDW 15.6* 15.4*  LYMPHSABS 0.9*  --   MONOABS 0.8  --   EOSABS 0.2  --   BASOSABS 0.0  --     Chemistries   Recent Labs Lab 08/18/16 1351 08/18/16 2348  NA 137 138  K 3.7 3.6  CL 97* 102  CO2 25 27  GLUCOSE 104* 115*  BUN <5* <5*  CREATININE 0.67 0.59*  CALCIUM 9.2 8.4*  AST 237* 197*  ALT 137* 112*  ALKPHOS 111 96  BILITOT 1.4* 2.2*   ------------------------------------------------------------------------------------------------------------------ estimated creatinine clearance is 97.6 mL/min (A) (by C-G formula based on SCr of 0.59 mg/dL (L)). ------------------------------------------------------------------------------------------------------------------ No results for input(s): HGBA1C in the last 72 hours. ------------------------------------------------------------------------------------------------------------------ No results for input(s): CHOL, HDL, LDLCALC, TRIG, CHOLHDL, LDLDIRECT in the last 72 hours. ------------------------------------------------------------------------------------------------------------------  Recent Labs  08/18/16 2348  TSH 0.663   ------------------------------------------------------------------------------------------------------------------ No results for input(s): VITAMINB12, FOLATE, FERRITIN, TIBC, IRON, RETICCTPCT in the last 72 hours.  Coagulation profile  Recent Labs Lab 08/18/16 1351  INR 0.93    No results for input(s): DDIMER in the last 72 hours.  Cardiac Enzymes No results for input(s): CKMB, TROPONINI, MYOGLOBIN in the last 168 hours.  Invalid input(s): CK ------------------------------------------------------------------------------------------------------------------ Invalid input(s): POCBNP    Assessment & Plan   Devin Bryant  is a 61 y.o. male with a known history of psoriasis, COPD, alcohol abuse, liver cirrhosis from alcohol and hypertension is presenting to the ED with a chief  complaint of redness of the hands which is getting worse for the past 3 days and worse on the left side. Patient also admits that he drinks 4-5 drinks of wine every day , last drink was today morning. Patient is jittery and anxious today.  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 Continue home medications xifixan and and Aldactone  #Tobacco abuse disorder Recommended to stop smoking       Code Status Orders        Start     Ordered   08/18/16 2128  Full code  Continuous     08/18/16 2127    Code Status History    Date Active Date Inactive Code Status Order ID Comments User Context   08/18/2016  2:00 PM 08/18/2016  9:27 PM Full Code [id number]  Willy Eddy, MD ED   02/03/2016  2:01 PM 02/04/2016  2:52 PM Full Code [id number]  Enedina Finner, MD Inpatient   01/15/2016  8:29 PM 01/17/2016  3:32 PM Full Code [id number]  Katha Hamming, MD ED   10/05/2015  3:13 PM 10/08/2015  4:53 PM Full Code [id number]  Alford HighlandWieting, Richard, MD ED   06/27/2015  2:36 PM 06/29/2015  2:34 PM Full Code [id number]  Katha HammingKonidena, Snehalatha, MD ED   03/11/2015  5:29 PM 03/14/2015  3:59 PM Full Code [id number]  Dorothea OgleMyers, Iskra M, MD Inpatient   10/17/2014  2:31 PM 10/19/2014  2:34 PM Full Code [id number]  Shaune Pollackhen, Qing, MD Inpatient    Advance Directive Documentation     Most Recent Value  Type of Advance Directive  Healthcare Power of Attorney  Pre-existing out of facility DNR order (yellow form or pink MOST form)  -  "MOST" Form in Place?  -            Consults None   DVT Prophylaxis  Lovenox   Lab Results  Component Value Date   PLT 127 (L) 08/18/2016     Time Spent in minutes   35min Greater than 50% of time spent in care coordination and counseling patient regarding the condition and plan of care.   Auburn BilberryPATEL, Jamarrion Budai M.D on 08/19/2016 at 1:48 PM  Between 7am to 6pm - Pager - [phone]  After 6pm go to [email or web address] - password [email or web address] [facility]  [facility]   Office  [phone]

## 2016-08-19 NOTE — Progress Notes (Addendum)
Pharmacy Antibiotic Note  Devin HusbandsDavid Kasper is a 61 y.o. male admitted on 08/18/2016 with cellulitis.  Pharmacy was consulted for Vancomycin dosing on 6/16, now consulted for Unasyn dosing. Patient received vancomycin 1500mg  IV x 1 dose in ED and clindamycin 600mg  IV x 1 dose.   Plan: Ke: 0.087   T1/2: 8  Vd: 57  Continue vancomycin 1250mg  IV every 12 hours. Calculated trough at Css is 14.8. Trough ordered prior to 4th dose. Will monitor renal function and adjust dose as needed.   Start Unasyn 3g IV every 6 hours.   Height: 5\' 10"  (177.8 cm) Weight: 180 lb (81.6 kg) IBW/kg (Calculated) : 73  Temp (24hrs), Avg:98.3 F (36.8 C), Min:98.1 F (36.7 C), Max:98.8 F (37.1 C)   Recent Labs Lab 08/18/16 1351 08/18/16 1803 08/18/16 2348  WBC 6.5  --  7.1  CREATININE 0.67  --  0.59*  LATICACIDVEN  --  3.0* 1.9    Estimated Creatinine Clearance: 97.6 mL/min (A) (by C-G formula based on SCr of 0.59 mg/dL (L)).    No Known Allergies  Antimicrobials this admission: 6/16 clindamycin 6/16 vancomycin >>  6/17 Unasyn >>  Dose adjustments this admission:   Microbiology results: 6/16 BCx: sent 6/17 MRSA PCR: negative   Thank you for allowing pharmacy to be a part of this patient's care.  Gardner CandleSheema M Rosetta Rupnow, PharmD, BCPS Clinical Pharmacist 08/19/2016 12:57 PM

## 2016-08-20 ENCOUNTER — Ambulatory Visit: Payer: BLUE CROSS/BLUE SHIELD | Admitting: Family Medicine

## 2016-08-20 ENCOUNTER — Telehealth: Payer: Self-pay | Admitting: Family Medicine

## 2016-08-20 LAB — BASIC METABOLIC PANEL
Anion gap: 10 (ref 5–15)
CALCIUM: 8.5 mg/dL — AB (ref 8.9–10.3)
CO2: 30 mmol/L (ref 22–32)
CREATININE: 0.59 mg/dL — AB (ref 0.61–1.24)
Chloride: 97 mmol/L — ABNORMAL LOW (ref 101–111)
GFR calc Af Amer: >60 mL/min (ref >60)
Glucose, Bld: 81 mg/dL (ref 65–99)
POTASSIUM: 2.7 mmol/L — AB (ref 3.5–5.1)
SODIUM: 137 mmol/L (ref 135–145)

## 2016-08-20 LAB — CBC
HCT: 37.9 % — ABNORMAL LOW (ref 40.0–52.0)
Hemoglobin: 12.9 g/dL — ABNORMAL LOW (ref 13.0–18.0)
MCH: 36.6 pg — AB (ref 26.0–34.0)
MCHC: 34.1 g/dL (ref 32.0–36.0)
MCV: 107.5 fL — ABNORMAL HIGH (ref 80.0–100.0)
PLATELETS: 127 10*3/uL — AB (ref 150–440)
RBC: 3.53 MIL/uL — AB (ref 4.40–5.90)
RDW: 15.1 % — AB (ref 11.5–14.5)
WBC: 6.5 10*3/uL (ref 3.8–10.6)

## 2016-08-20 LAB — MAGNESIUM: MAGNESIUM: 1.4 mg/dL — AB (ref 1.7–2.4)

## 2016-08-20 LAB — HIV ANTIBODY (ROUTINE TESTING W REFLEX): HIV Screen 4th Generation wRfx: NONREACTIVE

## 2016-08-20 MED ORDER — POTASSIUM CHLORIDE CRYS ER 20 MEQ PO TBCR
40.0000 meq | EXTENDED_RELEASE_TABLET | ORAL | Status: AC
  Administered 2016-08-20 (×2): 40 meq via ORAL
  Filled 2016-08-20 (×2): qty 2

## 2016-08-20 MED ORDER — LOPERAMIDE HCL 2 MG PO CAPS: 2.0000 mg | ORAL_CAPSULE | Freq: Four times a day (QID) | ORAL | 0 refills | Status: DC | PRN

## 2016-08-20 MED ORDER — RISAQUAD PO CAPS: 2.0000 | ORAL_CAPSULE | Freq: Three times a day (TID) | ORAL | Status: DC

## 2016-08-20 MED ORDER — POTASSIUM CHLORIDE CRYS ER 20 MEQ PO TBCR: 40.0000 meq | EXTENDED_RELEASE_TABLET | Freq: Two times a day (BID) | ORAL | 0 refills | Status: DC

## 2016-08-20 MED ORDER — AMOXICILLIN-POT CLAVULANATE 875-125 MG PO TABS: 1.0000 | ORAL_TABLET | Freq: Two times a day (BID) | ORAL | 0 refills | Status: DC

## 2016-08-20 MED ORDER — POTASSIUM CHLORIDE CRYS ER 20 MEQ PO TBCR
40.0000 meq | EXTENDED_RELEASE_TABLET | Freq: Once | ORAL | Status: AC
Start: 2016-08-20 — End: 2016-08-20
  Administered 2016-08-20: 40 meq via ORAL
  Filled 2016-08-20: qty 2

## 2016-08-20 NOTE — Telephone Encounter (Signed)
Sending to Dr.Johnson

## 2016-08-20 NOTE — Telephone Encounter (Signed)
I am aware that he has been hospitalized. He will still need an appointment when he's out before he can have a refill. Thanks.

## 2016-08-20 NOTE — Discharge Instructions (Signed)
[facility] [place] at [facility] ° °DIET:  °Cardiac diet ° °DISCHARGE CONDITION:  °Stable ° °ACTIVITY:  °Activity as tolerated ° °OXYGEN:  °Home Oxygen: No. °  °Oxygen Delivery: room air ° °DISCHARGE LOCATION:  °home  ° ° °ADDITIONAL DISCHARGE INSTRUCTION: ° ° °If you experience worsening of your admission symptoms, develop shortness of breath, life threatening emergency, suicidal or homicidal thoughts you must seek medical attention immediately by calling 911 or calling your MD immediately  if symptoms less severe. ° °You Must read complete instructions/literature along with all the possible adverse reactions/side effects for all the Medicines you take and that have been prescribed to you. Take any new Medicines after you have completely understood and accpet all the possible adverse reactions/side effects.  ° °Please note ° °You were cared for by a hospitalist during your hospital stay. If you have any questions about your discharge medications or the care you received while you were in the hospital after you are discharged, you can call the unit and asked to speak with the hospitalist on call if the hospitalist that took care of you is not available. Once you are discharged, your primary care physician will handle any further medical issues. Please note that NO REFILLS for any discharge medications will be authorized once you are discharged, as it is imperative that you return to your primary care physician (or establish a relationship with a primary care physician if you do not have one) for your aftercare needs so that they can reassess your need for medications and monitor your lab values. ° ° °

## 2016-08-20 NOTE — Telephone Encounter (Signed)
Patient called back.  I told him he needed to schedule a f/u appt in order to get the Tramadol refilled per Dr Laural BenesJohnson.  He states she can look in the chart to get updated due to the fact he has been hospitalized.  I told him I would pass this message to Dr Laural BenesJohnson.  Thanks

## 2016-08-20 NOTE — Progress Notes (Signed)
Reviewed Discharge summary with verbal understanding. Rxs electronically sent, belongings and inhalers given upon discharge. Escorted via wc to personal vehicle.

## 2016-08-20 NOTE — Telephone Encounter (Signed)
Patient's wife called to inform that patient wont be able to make his appointment 08/20/2016 at 2:30 pm due [facility]   Patient's wife also informed that patient needs a refill on her tramadol.   Patient's wife informed that she would like to be contacted regarding the medication refill due to patient being in the hospital getting lab work done.   Thornton DalesSandra Bartz (Spouse): [phone]  Please Advise.  Thank you

## 2016-08-20 NOTE — Discharge Summary (Signed)
[facility] [place] [facility], 61 y.o., DOB [birth year], MRN [id number]. Admission date: 08/18/2016 Discharge Date 08/20/2016 Primary MD Dorcas Carrow, DO Admitting Physician Ramonita Lab, MD  Admission Diagnosis  Lactic acidosis [E87.2] Alcohol abuse [F10.10] Cellulitis of left upper extremity [L03.114] Alcohol withdrawal syndrome with complication [facility]) [F10.239]  Discharge Diagnosis   Active Problems:   Cellulitis of arm, left  Severe psoriasis Alcohol abuse Mild alcohol withdrawal symptoms Adjustment disorder with depressed mood Anxiety COPD without exacerbation GERD Essential hypertension Tobacco abuse       Hospital Course Devin Bryant  is a 61 y.o. 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 Patient has severe psoriasis and recently started on Humira. He was admitted for cellulitis involving his upper extremity. He also had swelling in the arm. Had a Doppler of the upper extremity which was negative for DVT. He was treated with IV antibiotics. Patient redness is improved. He'll need follow-up with his primary dermatologist. He'll be continued on oral Augmentin. He is very anxious to go home today. He did have some withdrawal symptoms with some tremulousness but now improved. Patient states that he has no intention to quit drinking. He was counseled on stopping drinking.             Consults  None  Significant Tests:  See full reports for all details    Dg Chest 2 View  Result Date: 08/18/2016 CLINICAL DATA:  Tachycardia, COPD EXAM: CHEST  2 VIEW COMPARISON:  02/03/2016 chest radiograph. FINDINGS: Stable cardiomediastinal silhouette with normal heart size and aortic atherosclerosis. No pneumothorax. No pleural effusion.  Mildly hyperinflated lungs. No pulmonary edema. No acute consolidative airspace disease. IMPRESSION: Hyperinflated lungs, compatible with the provided history of COPD. Otherwise no active disease in the chest. Electronically Signed   By: Delbert Phenix M.D.   On: 08/18/2016 15:56   US Venous Img Upper Uni Left  Result Date: 08/18/2016 CLINICAL DATA:  Swelling and pain of the left upper extremity. EXAM: LEFT UPPER EXTREMITY VENOUS DOPPLER ULTRASOUND TECHNIQUE: Gray-scale sonography with graded compression, as well as color Doppler and duplex ultrasound were performed to evaluate the upper extremity deep venous system from the level of the subclavian vein and including the jugular, axillary, basilic, radial, ulnar and upper cephalic vein. Spectral Doppler was utilized to evaluate flow at rest and with distal augmentation maneuvers. COMPARISON:  None. FINDINGS: Contralateral Subclavian Vein: Respiratory phasicity is normal and symmetric with the symptomatic side. No evidence of thrombus. Normal compressibility. Internal Jugular Vein: No evidence of thrombus. Normal compressibility, respiratory phasicity and response to augmentation. Subclavian Vein: No evidence of thrombus. Normal compressibility, respiratory phasicity and response to augmentation. Axillary Vein: No evidence of thrombus. Normal compressibility, respiratory phasicity and response to augmentation. Cephalic Vein: No evidence of thrombus. Normal compressibility, respiratory phasicity and response to augmentation. Basilic Vein: No evidence of thrombus. Normal compressibility, respiratory phasicity and response to augmentation. Brachial Veins: No evidence of thrombus. Normal compressibility, respiratory phasicity and response to augmentation. Radial Veins: No evidence of thrombus. Normal compressibility, respiratory phasicity and response to augmentation. Ulnar Veins: No evidence of thrombus. Normal compressibility, respiratory phasicity and response to  augmentation. Venous Reflux:  None visualized. Other Findings:  None visualized. IMPRESSION: No evidence of DVT within the  left  upper extremity. Electronically Signed   By: Ted Mcalpineobrinka  Dimitrova M.D.   On: 08/18/2016 15:05   [place] Abdomen Limited Ruq  Result Date: 08/18/2016 CLINICAL DATA:  Mild coal abuse.  Elevated LFTs. EXAM: ULTRASOUND ABDOMEN LIMITED RIGHT UPPER QUADRANT COMPARISON:  MRI of the abdomen 10/06/2015 FINDINGS: Gallbladder: No gallstones or wall thickening visualized. No sonographic Murphy sign noted by sonographer. Common bile duct: Diameter: 4.8 mm Liver: No focal lesion identified. Diffusely heterogeneous increased echogenicity. Normal direction of flow of the main portal vein. IMPRESSION: Heterogeneous diffusely increased echogenicity of the liver, which may be seen with hepatic cirrhosis or fibrosis. No evidence of cholelithiasis. Electronically Signed   By: Ted Mcalpineobrinka  Dimitrova M.D.   On: 08/18/2016 17:43       Today   Subjective:   Devin Bryant  patient feeling better  Objective:   Blood pressure (!) 148/95, pulse (!) 105, temperature 98.2 F (36.8 C), temperature source Oral, resp. rate 16, height 5\' 10"  (1.778 m), weight 180 lb (81.6 kg), SpO2 96 %.  .  Intake/Output Summary (Last 24 hours) at 08/20/16 1217 Last data filed at 08/20/16 1022  Gross per 24 hour  Intake             1070 ml  Output                0 ml  Net             1070 ml    Exam VITAL SIGNS: Blood pressure (!) 148/95, pulse (!) 105, temperature 98.2 F (36.8 C), temperature source Oral, resp. rate 16, height 5\' 10"  (1.778 m), weight 180 lb (81.6 kg), SpO2 96 %.  GENERAL:  61 y.o.-year-old patient lying in the bed with no acute distress.  EYES: Pupils equal, round, reactive to light and accommodation. No scleral icterus. Extraocular muscles intact.  HEENT: Head atraumatic, normocephalic. Oropharynx and nasopharynx clear.  NECK:  Supple, no jugular venous distention. No thyroid enlargement, no  tenderness.  LUNGS: Normal breath sounds bilaterally, no wheezing, rales,rhonchi or crepitation. No use of accessory muscles of respiration.  CARDIOVASCULAR: S1, S2 normal. No murmurs, rubs, or gallops.  ABDOMEN: Soft, nontender, nondistended. Bowel sounds present. No organomegaly or mass.  EXTREMITIES: b/l upper Extremity erythema psoriatic changes NEUROLOGIC: Cranial nerves II through XII are intact. Muscle strength 5/5 in all extremities. Sensation intact. Gait not checked.  PSYCHIATRIC: The patient is alert and oriented x 3.  SKIN: No obvious rash, lesion, or ulcer.   Data Review     CBC w Diff: Lab Results  Component Value Date   WBC 6.5 08/20/2016   HGB 12.9 (L) 08/20/2016   HGB 15.0 01/30/2016   HCT 37.9 (L) 08/20/2016   HCT 43.7 01/30/2016   PLT 127 (L) 08/20/2016   PLT 218 01/30/2016   LYMPHOPCT 14 08/18/2016   MONOPCT 12 08/18/2016   EOSPCT 3 08/18/2016   BASOPCT 1 08/18/2016   CMP: Lab Results  Component Value Date   NA 137 08/20/2016   NA 139 12/08/2015   NA 139 07/21/2012   K 2.7 (LL) 08/20/2016   K 3.9 07/21/2012   CL 97 (L) 08/20/2016   CL 106 07/21/2012   CO2 30 08/20/2016   CO2 26 07/21/2012   BUN <5 (L) 08/20/2016   BUN 5 (L) 12/08/2015   BUN 10 07/21/2012   CREATININE 0.59 (L) 08/20/2016   CREATININE 0.76 07/21/2012   PROT 6.3 (L) 08/18/2016   PROT 7.0 12/08/2015   ALBUMIN 3.2 (  L) 08/18/2016   ALBUMIN 3.8 12/08/2015   BILITOT 2.2 (H) 08/18/2016   BILITOT 0.6 12/08/2015   ALKPHOS 96 08/18/2016   AST 197 (H) 08/18/2016   ALT 112 (H) 08/18/2016  .  Micro Results Recent Results (from the past 240 hour(s))  Blood culture (routine x 2)     Status: None (Preliminary result)   Collection Time: 08/18/16  7:00 PM  Result Value Ref Range Status   Specimen Description BLOOD RIGHT ANTECUBITAL  Final   Special Requests   Final    BOTTLES DRAWN AEROBIC AND ANAEROBIC Blood Culture adequate volume   Culture NO GROWTH 2 DAYS  Final   Report Status  PENDING  Incomplete  Blood culture (routine x 2)     Status: None (Preliminary result)   Collection Time: 08/18/16  7:41 PM  Result Value Ref Range Status   Specimen Description BLOOD RIGHT ANTECUBITAL  Final   Special Requests   Final    BOTTLES DRAWN AEROBIC AND ANAEROBIC Blood Culture adequate volume   Culture NO GROWTH 2 DAYS  Final   Report Status PENDING  Incomplete  MRSA PCR Screening     Status: None   Collection Time: 08/19/16  6:34 AM  Result Value Ref Range Status   MRSA by PCR NEGATIVE NEGATIVE Final    Comment:        The GeneXpert MRSA Assay (FDA approved for NASAL specimens only), is one component of a comprehensive MRSA colonization surveillance program. It is not intended to diagnose MRSA infection nor to guide or monitor treatment for MRSA infections.         Code Status Orders        Start     Ordered   08/18/16 2128  Full code  Continuous     08/18/16 2127    Code Status History    Date Active Date Inactive Code Status Order ID Comments User Context   08/18/2016  2:00 PM 08/18/2016  9:27 PM Full Code [id number]  Willy Eddy, MD ED   02/03/2016  2:01 PM 02/04/2016  2:52 PM Full Code [id number]  Enedina Finner, MD Inpatient   01/15/2016  8:29 PM 01/17/2016  3:32 PM Full Code [id number]  Katha Hamming, MD ED   10/05/2015  3:13 PM 10/08/2015  4:53 PM Full Code [id number]  Alford Highland, MD ED   06/27/2015  2:36 PM 06/29/2015  2:34 PM Full Code [id number]  Katha Hamming, MD ED   03/11/2015  5:29 PM 03/14/2015  3:59 PM Full Code [id number]  Dorothea Ogle, MD Inpatient   10/17/2014  2:31 PM 10/19/2014  2:34 PM Full Code [id number]  Shaune Pollack, MD Inpatient    Advance Directive Documentation     Most Recent Value  Type of Advance Directive  Healthcare Power of Attorney  Pre-existing out of facility DNR order (yellow form or pink MOST form)  -  "MOST" Form in Place?  -          Follow-up Information    Dorcas Carrow, DO On 08/21/2016.    Specialty:  Family Medicine Contact information: [address] [place] [address] [phone]        primary dermatoligist Follow up.   Why:  asap          Discharge Medications   Allergies as of 08/20/2016   No Known Allergies     Medication List    TAKE these medications   ADVAIR HFA 115-21 MCG/ACT inhaler Generic drug:  fluticasone-salmeterol Inhale 2 puffs into the lungs 2 (two) times daily.   albuterol (2.5 MG/3ML) 0.083% nebulizer solution Commonly known as:  PROVENTIL Take 3 mLs (2.5 mg total) by nebulization every 4 (four) hours as needed for wheezing or shortness of breath.   albuterol 108 (90 Base) MCG/ACT inhaler Commonly known as:  PROVENTIL HFA;VENTOLIN HFA Inhale 2 puffs into the lungs every 6 (six) hours as needed for wheezing or shortness of breath.   amoxicillin-clavulanate 875-125 MG tablet Commonly known as:  AUGMENTIN Take 1 tablet by mouth 2 (two) times daily.   aspirin 81 MG EC tablet Take 1 tablet (81 mg total) by mouth daily.   DULoxetine 20 MG capsule Commonly known as:  CYMBALTA Cut your lexapro in 1/2 for 1 week while you are taking your cymbalta to wean off. Take 1cap cymbalta for 2 weeks, then increase to 2 daily   escitalopram 10 MG tablet Commonly known as:  LEXAPRO Take 10 mg by mouth daily.   esomeprazole 20 MG capsule Commonly known as:  NEXIUM Take 20 mg by mouth daily at 12 noon.   fluticasone 50 MCG/ACT nasal spray Commonly known as:  FLONASE Place 1 spray into both nostrils daily.   loperamide 2 MG capsule Commonly known as:  IMODIUM Take 1 capsule (2 mg total) by mouth every 6 (six) hours as needed for diarrhea or loose stools.   mirtazapine 15 MG tablet Commonly known as:  REMERON Take 1 tablet (15 mg total) by mouth at bedtime.   multivitamin tablet Take 1 tablet by mouth daily.   nystatin 100000 UNIT/ML suspension Commonly known as:  MYCOSTATIN Take 5 mLs by mouth 2 (two) times daily. Uses with his  inhalers   potassium chloride SA 20 MEQ tablet Commonly known as:  K-DUR,KLOR-CON Take 2 tablets (40 mEq total) by mouth 2 (two) times daily.   pregabalin 75 MG capsule Commonly known as:  LYRICA Take 75 mg by mouth 2 (two) times daily.   rifaximin 550 MG Tabs tablet Commonly known as:  XIFAXAN Take 1 tablet (550 mg total) by mouth 2 (two) times daily.   spironolactone 25 MG tablet Commonly known as:  ALDACTONE Take 1 tablet (25 mg total) by mouth daily.   theophylline 300 MG 12 hr tablet Commonly known as:  THEODUR Take 300 mg by mouth 2 (two) times daily.   tiotropium 18 MCG inhalation capsule Commonly known as:  SPIRIVA HANDIHALER Place 1 capsule (18 mcg total) into inhaler and inhale daily.   traMADol 50 MG tablet Commonly known as:  ULTRAM TAKE 1 TABLET BY MOUTH TWICE DAILY   ZYRTEC ALLERGY 10 MG Caps Generic drug:  Cetirizine HCl Take 10 mg by mouth daily.          Total Time in preparing paper work, data evaluation and todays exam - 35 minutes  Auburn Bilberry M.D on 08/20/2016 at 12:17 PM  [facility]   Office  [phone]

## 2016-08-20 NOTE — Telephone Encounter (Signed)
Devin Bryant: Please notify patient of Dr.Johnsons response, and have him schedule an appointment.

## 2016-08-20 NOTE — Telephone Encounter (Signed)
Verbal from Dr.Johnson, that patient needs an appointment to have a refill.

## 2016-08-20 NOTE — Telephone Encounter (Signed)
Informed patient's wife. Patient's wife informed that she will call back to set up appointment when patient is release from hospital.

## 2016-08-23 ENCOUNTER — Encounter: Payer: Self-pay | Admitting: Family Medicine

## 2016-08-23 ENCOUNTER — Ambulatory Visit (INDEPENDENT_AMBULATORY_CARE_PROVIDER_SITE_OTHER): Payer: BLUE CROSS/BLUE SHIELD | Admitting: Family Medicine

## 2016-08-23 VITALS — BP 126/78 | HR 116 | Temp 98.5°F | Wt 183.8 lb

## 2016-08-23 DIAGNOSIS — L409 Psoriasis, unspecified: Secondary | ICD-10-CM | POA: Diagnosis not present

## 2016-08-23 DIAGNOSIS — F10239 Alcohol dependence with withdrawal, unspecified: Secondary | ICD-10-CM | POA: Diagnosis not present

## 2016-08-23 DIAGNOSIS — R197 Diarrhea, unspecified: Secondary | ICD-10-CM

## 2016-08-23 DIAGNOSIS — L03114 Cellulitis of left upper limb: Secondary | ICD-10-CM | POA: Diagnosis not present

## 2016-08-23 LAB — CULTURE, BLOOD (ROUTINE X 2)
Culture: NO GROWTH
Culture: NO GROWTH
Special Requests: ADEQUATE
Special Requests: ADEQUATE

## 2016-08-23 MED ORDER — METRONIDAZOLE 500 MG PO TABS: 500.0000 mg | ORAL_TABLET | Freq: Two times a day (BID) | ORAL | 0 refills | Status: DC

## 2016-08-23 MED ORDER — TRAMADOL HCL 50 MG PO TABS: 50.0000 mg | ORAL_TABLET | Freq: Two times a day (BID) | ORAL | 0 refills | Status: DC | PRN

## 2016-08-23 MED ORDER — AMOXICILLIN-POT CLAVULANATE 875-125 MG PO TABS: 1.0000 | ORAL_TABLET | Freq: Two times a day (BID) | ORAL | 0 refills | Status: AC

## 2016-08-23 NOTE — Patient Instructions (Addendum)

## 2016-08-23 NOTE — Assessment & Plan Note (Signed)
Severe. Seeing dermatology on Monday.

## 2016-08-23 NOTE — Assessment & Plan Note (Signed)
Does not want to stop drinking right now. He knows we are here if interested in it.

## 2016-08-23 NOTE — Progress Notes (Signed)
BP 126/78 (BP Location: Right Arm, Patient Position: Sitting, Cuff Size: Large)   Pulse (!) 116   Temp 98.5 F (36.9 C)   Wt 183 lb 12.8 oz (83.4 kg)   SpO2 95%   BMI 26.37 kg/m    Subjective:    Patient ID: Devin Bryant, male    DOB: 24-Jan-1956, 61 y.o.   MRN: [id number]  HPI: Devin HusbandsDavid Klingbeil is a 61 y.o. male  Chief Complaint  Patient presents with  . Hospitalization Follow-up   HOSPITAL FOLLOW UP Time since discharge: 3 days Hospital/facility: [facility] Diagnosis: Alcohol withdrawal syndrome, severe psoriasis, Cellulitis of L arm Procedures/tests: US- neg DVT, IV antibiotic, augmentin Consultants: None New medications: Augmentin Discharge instructions:  Follow up with dermatology and here Status: stable  Patient presents today because he states that he needs his olecronon bursa drained so that he can continue with his humera  Has not seen dermatologist- due to see him on Monday  He has been having a lot of diarrhea- every morning when he first wakes up. He thinks that it may have something to with the antibiotic.   He has been in a lot of pain. Feels like his joints have been aching as have his muscles. He is drinking again and doesn't intend to stop. He is tired. He is not feeling like himself. Both he and his wife are concerned.   Relevant past medical, surgical, family and social history reviewed and updated as indicated. Interim medical history since our last visit reviewed. Allergies and medications reviewed and updated.  Review of Systems  Constitutional: Negative.   Respiratory: Negative.   Cardiovascular: Negative.   Gastrointestinal: Positive for abdominal pain, diarrhea and nausea. Negative for abdominal distention, anal bleeding, blood in stool, constipation, rectal pain and vomiting.  Musculoskeletal: Positive for arthralgias and myalgias. Negative for back pain, gait problem, joint swelling, neck pain and neck stiffness.  Skin: Positive for color change  and rash. Negative for pallor and wound.  Psychiatric/Behavioral: Negative.     Per HPI unless specifically indicated above     Objective:    BP 126/78 (BP Location: Right Arm, Patient Position: Sitting, Cuff Size: Large)   Pulse (!) 116   Temp 98.5 F (36.9 C)   Wt 183 lb 12.8 oz (83.4 kg)   SpO2 95%   BMI 26.37 kg/m   Wt Readings from Last 3 Encounters:  08/23/16 183 lb 12.8 oz (83.4 kg)  08/18/16 180 lb (81.6 kg)  04/16/16 184 lb (83.5 kg)    Physical Exam  Constitutional: He is oriented to person, place, and time. He appears well-developed and well-nourished. He appears ill. No distress.  HENT:  Head: Normocephalic and atraumatic.  Right Ear: Hearing normal.  Left Ear: Hearing normal.  Nose: Nose normal.  Eyes: Conjunctivae and lids are normal. Right eye exhibits no discharge. Left eye exhibits no discharge. No scleral icterus.  Cardiovascular: Normal rate, regular rhythm, normal heart sounds and intact distal pulses.  Exam reveals no gallop and no friction rub.   No murmur heard. Pulmonary/Chest: Effort normal and breath sounds normal. No respiratory distress. He has no wheezes. He has no rales. He exhibits no tenderness.  Abdominal: Soft. Bowel sounds are normal. He exhibits no distension and no mass. There is no tenderness. There is no rebound and no guarding.  Musculoskeletal: Normal range of motion.  Neurological: He is alert and oriented to person, place, and time.  Skin: Skin is warm, dry and intact. Rash (severe psoriasis) noted.  He is not diaphoretic. No erythema. No pallor.     L arm significantly swollen and red (see drawing) slight swelling of L olecronon bursa.   Psychiatric: He has a normal mood and affect. His speech is normal and behavior is normal. Judgment and thought content normal. Cognition and memory are normal.  Nursing note and vitals reviewed.   Results for orders placed or performed during the hospital encounter of 08/18/16  Blood culture  (routine x 2)  Result Value Ref Range   Specimen Description BLOOD RIGHT ANTECUBITAL    Special Requests      BOTTLES DRAWN AEROBIC AND ANAEROBIC Blood Culture adequate volume   Culture NO GROWTH 5 DAYS    Report Status 08/23/2016 FINAL   Blood culture (routine x 2)  Result Value Ref Range   Specimen Description BLOOD RIGHT ANTECUBITAL    Special Requests      BOTTLES DRAWN AEROBIC AND ANAEROBIC Blood Culture adequate volume   Culture NO GROWTH 5 DAYS    Report Status 08/23/2016 FINAL   MRSA PCR Screening  Result Value Ref Range   MRSA by PCR NEGATIVE NEGATIVE  CBC with Differential/Platelet  Result Value Ref Range   WBC 6.5 3.8 - 10.6 K/uL   RBC 3.71 (L) 4.40 - 5.90 MIL/uL   Hemoglobin 13.8 13.0 - 18.0 g/dL   HCT 16.1 (L) 09.6 - 04.5 %   MCV 106.9 (H) 80.0 - 100.0 fL   MCH 37.1 (H) 26.0 - 34.0 pg   MCHC 34.7 32.0 - 36.0 g/dL   RDW 40.9 (H) 81.1 - 91.4 %   Platelets 147 (L) 150 - 440 K/uL   Neutrophils Relative % 70 %   Neutro Abs 4.6 1.4 - 6.5 K/uL   Lymphocytes Relative 14 %   Lymphs Abs 0.9 (L) 1.0 - 3.6 K/uL   Monocytes Relative 12 %   Monocytes Absolute 0.8 0.2 - 1.0 K/uL   Eosinophils Relative 3 %   Eosinophils Absolute 0.2 0 - 0.7 K/uL   Basophils Relative 1 %   Basophils Absolute 0.0 0 - 0.1 K/uL  Comprehensive metabolic panel  Result Value Ref Range   Sodium 137 135 - 145 mmol/L   Potassium 3.7 3.5 - 5.1 mmol/L   Chloride 97 (L) 101 - 111 mmol/L   CO2 25 22 - 32 mmol/L   Glucose, Bld 104 (H) 65 - 99 mg/dL   BUN <5 (L) 6 - 20 mg/dL   Creatinine, Ser 7.82 0.61 - 1.24 mg/dL   Calcium 9.2 8.9 - 95.6 mg/dL   Total Protein 7.5 6.5 - 8.1 g/dL   Albumin 3.9 3.5 - 5.0 g/dL   AST 213 (H) 15 - 41 U/L   ALT 137 (H) 17 - 63 U/L   Alkaline Phosphatase 111 38 - 126 U/L   Total Bilirubin 1.4 (H) 0.3 - 1.2 mg/dL   GFR calc non Af Amer >60 >60 mL/min   GFR calc Af Amer >60 >60 mL/min   Anion gap 15 5 - 15  Protime-INR  Result Value Ref Range   Prothrombin Time 12.5  11.4 - 15.2 seconds   INR 0.93   Lactic acid, plasma  Result Value Ref Range   Lactic Acid, Venous 3.0 (HH) 0.5 - 1.9 mmol/L  Lactic acid, plasma  Result Value Ref Range   Lactic Acid, Venous 1.9 0.5 - 1.9 mmol/L  Ammonia  Result Value Ref Range   Ammonia 23 9 - 35 umol/L  TSH  Result Value Ref  Range   TSH 0.663 0.350 - 4.500 uIU/mL  Comprehensive metabolic panel  Result Value Ref Range   Sodium 138 135 - 145 mmol/L   Potassium 3.6 3.5 - 5.1 mmol/L   Chloride 102 101 - 111 mmol/L   CO2 27 22 - 32 mmol/L   Glucose, Bld 115 (H) 65 - 99 mg/dL   BUN <5 (L) 6 - 20 mg/dL   Creatinine, Ser 4.09 (L) 0.61 - 1.24 mg/dL   Calcium 8.4 (L) 8.9 - 10.3 mg/dL   Total Protein 6.3 (L) 6.5 - 8.1 g/dL   Albumin 3.2 (L) 3.5 - 5.0 g/dL   AST 811 (H) 15 - 41 U/L   ALT 112 (H) 17 - 63 U/L   Alkaline Phosphatase 96 38 - 126 U/L   Total Bilirubin 2.2 (H) 0.3 - 1.2 mg/dL   GFR calc non Af Amer >60 >60 mL/min   GFR calc Af Amer >60 >60 mL/min   Anion gap 9 5 - 15  CBC  Result Value Ref Range   WBC 7.1 3.8 - 10.6 K/uL   RBC 3.38 (L) 4.40 - 5.90 MIL/uL   Hemoglobin 12.5 (L) 13.0 - 18.0 g/dL   HCT 91.4 (L) 78.2 - 95.6 %   MCV 105.9 (H) 80.0 - 100.0 fL   MCH 37.0 (H) 26.0 - 34.0 pg   MCHC 34.9 32.0 - 36.0 g/dL   RDW 21.3 (H) 08.6 - 57.8 %   Platelets 127 (L) 150 - 440 K/uL  HIV antibody (Routine Testing)  Result Value Ref Range   HIV Screen 4th Generation wRfx Non Reactive Non Reactive  CBC  Result Value Ref Range   WBC 6.5 3.8 - 10.6 K/uL   RBC 3.53 (L) 4.40 - 5.90 MIL/uL   Hemoglobin 12.9 (L) 13.0 - 18.0 g/dL   HCT 46.9 (L) 62.9 - 52.8 %   MCV 107.5 (H) 80.0 - 100.0 fL   MCH 36.6 (H) 26.0 - 34.0 pg   MCHC 34.1 32.0 - 36.0 g/dL   RDW 41.3 (H) 24.4 - 01.0 %   Platelets 127 (L) 150 - 440 K/uL  Basic metabolic panel  Result Value Ref Range   Sodium 137 135 - 145 mmol/L   Potassium 2.7 (LL) 3.5 - 5.1 mmol/L   Chloride 97 (L) 101 - 111 mmol/L   CO2 30 22 - 32 mmol/L   Glucose, Bld 81 65 -  99 mg/dL   BUN <5 (L) 6 - 20 mg/dL   Creatinine, Ser 2.72 (L) 0.61 - 1.24 mg/dL   Calcium 8.5 (L) 8.9 - 10.3 mg/dL   GFR calc non Af Amer >60 >60 mL/min   GFR calc Af Amer >60 >60 mL/min   Anion gap 10 5 - 15  Magnesium  Result Value Ref Range   Magnesium 1.4 (L) 1.7 - 2.4 mg/dL      Assessment & Plan:   Problem List Items Addressed This Visit      Nervous and Auditory   Alcohol dependence with withdrawal with complication (HCC)    Does not want to stop drinking right now. He knows we are here if interested in it.         Musculoskeletal and Integument   Psoriasis    Severe. Seeing dermatology on Monday.         Other   Cellulitis of arm, left - Primary    Discussed with patient and his wife that it is not safe to drain his bursa with cellulitis  as it can introduce infection. They are aware. Still relatively severe. Will extend augmentin to 10 days and start flagyl to cover anerobes. Recheck Monday. Warning signs to go to ER discussed.           Follow up plan: Return Monday, for Follow up cellulitis.

## 2016-08-23 NOTE — Assessment & Plan Note (Signed)
Discussed with patient and his wife that it is not safe to drain his bursa with cellulitis as it can introduce infection. They are aware. Still relatively severe. Will extend augmentin to 10 days and start flagyl to cover anerobes. Recheck Monday. Warning signs to go to ER discussed.

## 2016-08-24 ENCOUNTER — Other Ambulatory Visit: Payer: BLUE CROSS/BLUE SHIELD

## 2016-08-24 DIAGNOSIS — R197 Diarrhea, unspecified: Secondary | ICD-10-CM

## 2016-08-24 NOTE — Addendum Note (Signed)
Addended by: Dorcas CarrowJOHNSON, Dayanne Yiu P on: 08/24/2016 08:28 AM   Modules accepted: Orders

## 2016-08-26 LAB — CLOSTRIDIUM DIFFICILE EIA: C DIFFICILE TOXINS A+ B, EIA: NEGATIVE

## 2016-08-27 ENCOUNTER — Ambulatory Visit (INDEPENDENT_AMBULATORY_CARE_PROVIDER_SITE_OTHER): Payer: BLUE CROSS/BLUE SHIELD | Admitting: Family Medicine

## 2016-08-27 ENCOUNTER — Encounter: Payer: Self-pay | Admitting: Family Medicine

## 2016-08-27 VITALS — BP 173/82 | HR 127 | Temp 98.5°F | Wt 180.0 lb

## 2016-08-27 DIAGNOSIS — L03114 Cellulitis of left upper limb: Secondary | ICD-10-CM

## 2016-08-27 MED ORDER — LIDOCAINE 5 % EX OINT: 1.0000 "application " | TOPICAL_OINTMENT | CUTANEOUS | 1 refills | Status: DC | PRN

## 2016-08-27 MED ORDER — ONDANSETRON 4 MG PO TBDP: 4.0000 mg | ORAL_TABLET | Freq: Three times a day (TID) | ORAL | 1 refills | Status: DC | PRN

## 2016-08-27 NOTE — Assessment & Plan Note (Signed)
Improving significantly. Continue to monitor. Will check in on Wednesday and if improved enough consider draining bursa on Friday.

## 2016-08-27 NOTE — Progress Notes (Addendum)
BP (!) 173/82 (BP Location: Left Arm, Patient Position: Sitting, Cuff Size: Normal)   Pulse (!) 127   Temp 98.5 F (36.9 C)   Wt 180 lb (81.6 kg)   SpO2 95%   BMI 25.83 kg/m    Subjective:    Patient ID: Devin Bryant, male    DOB: 1955-03-08, 61 y.o.   MRN: [id number]  HPI: Devin Bryant is a 61 y.o. male  Chief Complaint  Patient presents with  . Cellulitis   Devin HuaDavid is feeling much much better. Cellulitis has improved. Less swollen. Less painful. Still very hot. Cancelled appointment with dermatology right now as he doesn't want to do humeira right now. No fevers. No chills. Diarrhea still going on. He is feeling much better in general.   Relevant past medical, surgical, family and social history reviewed and updated as indicated. Interim medical history since our last visit reviewed. Allergies and medications reviewed and updated.  Review of Systems  Constitutional: Negative.   Respiratory: Negative.   Cardiovascular: Negative.   Musculoskeletal: Negative.   Skin: Positive for color change, rash and wound. Negative for pallor.  Neurological: Negative.   Psychiatric/Behavioral: Negative.     Per HPI unless specifically indicated above     Objective:    BP (!) 173/82 (BP Location: Left Arm, Patient Position: Sitting, Cuff Size: Normal)   Pulse (!) 127   Temp 98.5 F (36.9 C)   Wt 180 lb (81.6 kg)   SpO2 95%   BMI 25.83 kg/m   Wt Readings from Last 3 Encounters:  08/27/16 180 lb (81.6 kg)  08/23/16 183 lb 12.8 oz (83.4 kg)  08/18/16 180 lb (81.6 kg)    Physical Exam  Constitutional: He is oriented to person, place, and time. He appears well-developed and well-nourished. No distress.  HENT:  Head: Normocephalic and atraumatic.  Right Ear: Hearing normal.  Left Ear: Hearing normal.  Nose: Nose normal.  Eyes: Conjunctivae and lids are normal. Right eye exhibits no discharge. Left eye exhibits no discharge. No scleral icterus.  Cardiovascular: Normal rate,  regular rhythm, normal heart sounds and intact distal pulses.  Exam reveals no gallop and no friction rub.   No murmur heard. Pulmonary/Chest: Effort normal and breath sounds normal. No respiratory distress. He has no wheezes. He has no rales. He exhibits no tenderness.  Musculoskeletal: Normal range of motion.  Neurological: He is alert and oriented to person, place, and time.  Skin: Skin is warm, dry and intact. Rash (severe psoriasis) noted. There is erythema. No pallor.     Psychiatric: He has a normal mood and affect. His speech is normal and behavior is normal. Judgment and thought content normal. Cognition and memory are normal.  Nursing note and vitals reviewed.   Results for orders placed or performed in visit on 08/24/16  Stool C-Diff Toxin Assay  Result Value Ref Range   C difficile Toxins A+B, EIA Negative Negative  Stool Culture  Result Value Ref Range   Salmonella/Shigella Screen WILL FOLLOW    Campylobacter Culture WILL FOLLOW    E coli, Shiga toxin Assay Negative Negative      Assessment & Plan:   Problem List Items Addressed This Visit      Other   Cellulitis of arm, left - Primary    Improving significantly. Continue to monitor. Will check in on Wednesday and if improved enough consider draining bursa on Friday.          Follow up plan: Return Friday.

## 2016-08-28 ENCOUNTER — Ambulatory Visit: Payer: BLUE CROSS/BLUE SHIELD | Admitting: Family Medicine

## 2016-08-29 ENCOUNTER — Telehealth: Payer: Self-pay | Admitting: Family Medicine

## 2016-08-29 LAB — STOOL CULTURE: E COLI SHIGA TOXIN ASSAY: NEGATIVE

## 2016-08-29 NOTE — Telephone Encounter (Signed)
States that his arm is doing so much better! Not hot any more. Feeling well. Only concern now is the bursa swelling. Will come back in the week of July 9th for bursa drainage. Culture results negative. He is aware.

## 2016-08-31 ENCOUNTER — Ambulatory Visit: Payer: BLUE CROSS/BLUE SHIELD | Admitting: Family Medicine

## 2016-09-02 LAB — OVA AND PARASITE EXAMINATION

## 2016-09-02 LAB — FECAL LEUKOCYTES

## 2016-09-03 ENCOUNTER — Encounter: Payer: Self-pay | Admitting: Family Medicine

## 2016-09-11 ENCOUNTER — Ambulatory Visit (INDEPENDENT_AMBULATORY_CARE_PROVIDER_SITE_OTHER): Payer: BLUE CROSS/BLUE SHIELD | Admitting: Family Medicine

## 2016-09-11 ENCOUNTER — Encounter: Payer: Self-pay | Admitting: Family Medicine

## 2016-09-11 VITALS — BP 138/88 | HR 133 | Temp 98.5°F | Wt 175.1 lb

## 2016-09-11 DIAGNOSIS — F1010 Alcohol abuse, uncomplicated: Secondary | ICD-10-CM | POA: Diagnosis not present

## 2016-09-11 DIAGNOSIS — G629 Polyneuropathy, unspecified: Secondary | ICD-10-CM

## 2016-09-11 DIAGNOSIS — R5381 Other malaise: Secondary | ICD-10-CM | POA: Diagnosis not present

## 2016-09-11 DIAGNOSIS — R197 Diarrhea, unspecified: Secondary | ICD-10-CM

## 2016-09-11 DIAGNOSIS — R630 Anorexia: Secondary | ICD-10-CM

## 2016-09-11 DIAGNOSIS — M7022 Olecranon bursitis, left elbow: Secondary | ICD-10-CM

## 2016-09-11 DIAGNOSIS — L409 Psoriasis, unspecified: Secondary | ICD-10-CM | POA: Diagnosis not present

## 2016-09-11 DIAGNOSIS — B370 Candidal stomatitis: Secondary | ICD-10-CM

## 2016-09-11 MED ORDER — FIRST-DUKES MOUTHWASH MT SUSP: 5.0000 mL | Freq: Two times a day (BID) | OROMUCOSAL | 2 refills | Status: DC

## 2016-09-11 MED ORDER — MIRTAZAPINE 30 MG PO TABS: 30.0000 mg | ORAL_TABLET | Freq: Every day | ORAL | 1 refills | Status: DC

## 2016-09-11 MED ORDER — PREGABALIN 75 MG PO CAPS: 75.0000 mg | ORAL_CAPSULE | Freq: Two times a day (BID) | ORAL | 4 refills | Status: DC

## 2016-09-11 MED ORDER — FLUCONAZOLE 100 MG PO TABS: 100.0000 mg | ORAL_TABLET | Freq: Once | ORAL | 0 refills | Status: AC

## 2016-09-11 MED ORDER — TRAMADOL HCL 50 MG PO TABS: 50.0000 mg | ORAL_TABLET | Freq: Two times a day (BID) | ORAL | 0 refills | Status: DC | PRN

## 2016-09-11 NOTE — Assessment & Plan Note (Signed)
Likely contributing to many of his symptoms. He is aware that we are here if he needs any help.

## 2016-09-11 NOTE — Assessment & Plan Note (Signed)
Seeing dermatology tomorrow.

## 2016-09-11 NOTE — Assessment & Plan Note (Signed)
Stable on current regimen. Will continue his lyrica and tramadol. Will start him on the 28 day cycle- Rx given today.

## 2016-09-11 NOTE — Progress Notes (Signed)
BP 138/88 (BP Location: Left Arm, Patient Position: Sitting, Cuff Size: Normal)   Pulse (!) 133   Temp 98.5 F (36.9 C)   Wt 175 lb 2 oz (79.4 kg)   SpO2 92%   BMI 25.13 kg/m    Subjective:    Patient ID: Devin Bryant, male    DOB: October 21, 1955, 61 y.o.   MRN: [id number]  HPI: Devin Bryant is a 61 y.o. male  Chief Complaint  Patient presents with  . Nasal Congestion  . Other    Drain elbow   UPPER RESPIRATORY TRACT INFECTION Duration: a couple of days Worst symptom: congestion Fever: no Cough: yes Shortness of breath: yes Wheezing: no Chest pain: no Chest tightness: no Chest congestion: no Nasal congestion: yes Runny nose: no Post nasal drip: no Sneezing: no Sore throat: yes Swollen glands: no Sinus pressure: no Headache: no Face pain: no Toothache: no Ear pain: no  Ear pressure: no  Eyes red/itching:no Eye drainage/crusting: no  Vomiting: no Rash: yes Fatigue: yes Sick contacts: no Strep contacts: no  Context: worse Recurrent sinusitis: no Relief with OTC cold/cough medications: no  Treatments attempted: none   NEUROPATHY Neuropathy status: stable  Satisfied with current treatment?: no Medication side effects: no Medication compliance:  excellent compliance Location: bilateral feet Pain: yes Severity: moderate  Quality:  Sharp, numb, tingling Frequency: constant Bilateral: yes Symmetric: yes Numbness: yes Decreased sensation: yes Weakness: yes Context: stable Alleviating factors: medication Aggravating factors: nothing Treatments attempted: lyrica, tramadol  Continues to have swelling over L elbow. Had to have R drained 2x by orthopedics.   Cellulitis resolved.   Diarrhea continues- seems to be getting better, but still happening.   Very weak. Wife had to help him take a bath today. Unsteady on his feet. Poor appetite. Doesn't want to eat at all. Still drinking. Not interested in quitting. Slightly tremulous today.   Relevant  past medical, surgical, family and social history reviewed and updated as indicated. Interim medical history since our last visit reviewed. Allergies and medications reviewed and updated.  Review of Systems  Constitutional: Positive for activity change, appetite change, fatigue and unexpected weight change. Negative for chills, diaphoresis and fever.  HENT: Positive for congestion, postnasal drip and sore throat. Negative for dental problem, drooling, ear discharge, ear pain, facial swelling, hearing loss, mouth sores, nosebleeds, rhinorrhea, sinus pain, sinus pressure, sneezing, tinnitus, trouble swallowing and voice change.   Respiratory: Negative.   Cardiovascular: Negative.   Gastrointestinal: Positive for diarrhea. Negative for abdominal distention, abdominal pain, anal bleeding, blood in stool, constipation, nausea, rectal pain and vomiting.  Musculoskeletal: Positive for joint swelling. Negative for arthralgias, back pain, gait problem, myalgias, neck pain and neck stiffness.  Neurological: Positive for weakness and numbness. Negative for dizziness, tremors, seizures, syncope, facial asymmetry, speech difficulty, light-headedness and headaches.  Psychiatric/Behavioral: Negative.     Per HPI unless specifically indicated above     Objective:    BP 138/88 (BP Location: Left Arm, Patient Position: Sitting, Cuff Size: Normal)   Pulse (!) 133   Temp 98.5 F (36.9 C)   Wt 175 lb 2 oz (79.4 kg)   SpO2 92%   BMI 25.13 kg/m   Wt Readings from Last 3 Encounters:  09/11/16 175 lb 2 oz (79.4 kg)  08/27/16 180 lb (81.6 kg)  08/23/16 183 lb 12.8 oz (83.4 kg)    Physical Exam  Constitutional: He is oriented to person, place, and time. He appears well-developed and well-nourished. No distress.  HENT:  Head: Normocephalic and atraumatic.  Right Ear: Hearing and external ear normal.  Left Ear: Hearing and external ear normal.  Nose: Nose normal.  Mouth/Throat: No oropharyngeal exudate.    Yeast on tongue and at back of the throat  Eyes: Conjunctivae, EOM and lids are normal. Pupils are equal, round, and reactive to light. Right eye exhibits no discharge. Left eye exhibits no discharge. No scleral icterus.  Neck: Normal range of motion. Neck supple. No JVD present. No tracheal deviation present. No thyromegaly present.  Cardiovascular: Normal rate, regular rhythm, normal heart sounds and intact distal pulses.  Exam reveals no gallop and no friction rub.   No murmur heard. Pulmonary/Chest: Effort normal and breath sounds normal. No stridor. No respiratory distress. He has no wheezes. He has no rales. He exhibits no tenderness.  Abdominal: Soft. Bowel sounds are normal. He exhibits no distension and no mass. There is no tenderness. There is no rebound and no guarding.  Musculoskeletal: Normal range of motion.  Swollen olecronon bursa on the L  Lymphadenopathy:    He has no cervical adenopathy.  Neurological: He is alert and oriented to person, place, and time.  tremulous  Skin: Skin is warm, dry and intact. Rash noted. He is not diaphoretic. There is erythema. No pallor.  Psychiatric: He has a normal mood and affect. His speech is normal and behavior is normal. Judgment and thought content normal. Cognition and memory are normal.  Nursing note and vitals reviewed.   Results for orders placed or performed in visit on 08/24/16  Stool C-Diff Toxin Assay  Result Value Ref Range   C difficile Toxins A+B, EIA Negative Negative  Fecal leukocytes  Result Value Ref Range   White Blood Cells (WBC), Stool Final report None Seen   Comment Comment   Stool Culture  Result Value Ref Range   Salmonella/Shigella Screen Final report    Stool Culture result 1 ([facility]) Comment    Campylobacter Culture Final report    Stool Culture result 1 ([facility]) Comment    E coli, Shiga toxin Assay Negative Negative  Ova and parasite examination  Result Value Ref Range   OVA + PARASITE EXAM Final  report    O&P result 1 Comment       Assessment & Plan:   Problem List Items Addressed This Visit      Nervous and Auditory   Neuropathy    Stable on current regimen. Will continue his lyrica and tramadol. Will start him on the 28 day cycle- Rx given today.        Musculoskeletal and Integument   Psoriasis    Seeing dermatology tomorrow.       Relevant Orders   Synovial Fluid Panel     Other   Alcohol abuse    Likely contributing to many of his symptoms. He is aware that we are here if he needs any help.        Other Visit Diagnoses    Olecranon bursitis of left elbow    -  Primary   Drained today as below. Will send synovial fluid out for evaluation due to severe psoriasis.   Relevant Orders   Synovial Fluid Panel   Decreased appetite       Will increase his remeron to 30mg  daily. Ensure Rx given today. Cut down on ETOH.   Diarrhea, unspecified type       Likely due to recent abx. Stool studies normal. If not better by Thurs/Fri- will refer to  GI for further evaluation. Already on probiotics and imodium.   Thrush       Will treat with diflucan and magic mouth wash- likely the cause of his congestion. Call with any concerns.    Relevant Medications   Diphenhyd-Hydrocort-Nystatin (FIRST-DUKES MOUTHWASH) SUSP   fluconazole (DIFLUCAN) 100 MG tablet   Physical deconditioning       Will get him some PT. Patient is homebound. Referral for home PT generated today.      Procedure: Left Olecranon Bursitis Aspiriation and Steroid Injection        Diagnosis:   ICD-10-CM   1. Olecranon bursitis of left elbow M70.22 Synovial Fluid Panel   Drained today as below. Will send synovial fluid out for evaluation due to severe psoriasis.  2. Decreased appetite R63.0    Will increase his remeron to 30mg  daily. Ensure Rx given today. Cut down on ETOH.  3. Psoriasis L40.9 Synovial Fluid Panel  4. Diarrhea, unspecified type R19.7    Likely due to recent abx. Stool studies normal. If  not better by Thurs/Fri- will refer to GI for further evaluation. Already on probiotics and imodium.  5. Neuropathy G62.9   6. Alcohol abuse F10.10   7. Thrush B37.0    Will treat with diflucan and magic mouth wash- likely the cause of his congestion. Call with any concerns.   8. Physical deconditioning R53.81    Will get him some PT. Patient is homebound. Referral for home PT generated today.    Physician: MJ Consent:  Risks, benefits, and alternative treatments discussed and all questions were answered.  Patient elected to proceed and verbal consent obtained.  Description: Area prepped and draped using  semi-sterile technique. 5.5cc of clear yellow synovial fluid drained from L olecronon bursa.  A mixture of 40mg  of Kenalog and 2cc of lidocaine 1% was injected into the bursa sac following drainage Complications: none Post Procedure Instructions: Wound care instructions discussed and patient was instructed to keep area clean and dry.  Signs and symptoms of infection discussed, patient agrees to contact the office ASAP should they occur.   Follow up plan: Return for Follow up.

## 2016-09-13 ENCOUNTER — Telehealth: Payer: Self-pay | Admitting: Family Medicine

## 2016-09-13 NOTE — Telephone Encounter (Signed)
Message relayed. While on the phone pt stated that insurance is not wanting to cover tramadol. Prior authorization will be initiated.

## 2016-09-13 NOTE — Telephone Encounter (Signed)
Please let him know that the fluid from his elbow did not grow out any bacteria or crystals. It looked like just normal fluid. Thanks!

## 2016-09-13 NOTE — Telephone Encounter (Signed)
P.A. initated via covermymeds.  "Devin Bryant (Key: [id number])  Your information has been submitted to [facility] [place]. [employer] [place] will review the request and fax you a determination directly, typically within 3 business days of your submission once all necessary information is received. If [employer] [place] has not responded in 3 business days or if you have any questions about your submission, contact [employer] [place] at [phone]."

## 2016-09-16 LAB — SYNOVIAL FLUID PANEL
GLUCOSE FL: 103 mg/dL
Nuc cell # Fld: 97 cells/uL (ref 0–200)
Protein, Fluid: 3.9 g/dL

## 2016-09-17 ENCOUNTER — Telehealth: Payer: Self-pay | Admitting: Family Medicine

## 2016-09-17 NOTE — Telephone Encounter (Signed)
Prior authorization approved for Tramadol.  Patient notified via voicemail.

## 2016-09-17 NOTE — Telephone Encounter (Signed)
Patient called to see if Dr Laural BenesJohnson could help him with the Tramadol.  He paid out of pocket for the first 30 day supply as ins would not cover. He is unsure if the insurance needs a prior auth or exactly why the ins refused to pay.  [facility] [place]  He can be reached at [phone] with any questions.    Thanks

## 2016-10-04 ENCOUNTER — Other Ambulatory Visit: Payer: Self-pay | Admitting: Family Medicine

## 2016-10-04 MED ORDER — TRAMADOL HCL 50 MG PO TABS: 50.0000 mg | ORAL_TABLET | Freq: Two times a day (BID) | ORAL | 0 refills | Status: DC | PRN

## 2016-10-12 ENCOUNTER — Other Ambulatory Visit: Payer: Self-pay | Admitting: Family Medicine

## 2016-10-12 MED ORDER — TRAMADOL HCL 50 MG PO TABS: 50.0000 mg | ORAL_TABLET | Freq: Two times a day (BID) | ORAL | 0 refills | Status: DC | PRN

## 2016-10-19 ENCOUNTER — Telehealth: Payer: Self-pay | Admitting: Family Medicine

## 2016-10-19 MED ORDER — SPIRONOLACTONE 25 MG PO TABS: 25.0000 mg | ORAL_TABLET | Freq: Every day | ORAL | 0 refills | Status: DC

## 2016-10-19 NOTE — Telephone Encounter (Signed)
Pt would like a refill for spironlactone sent to [facility].

## 2016-10-19 NOTE — Telephone Encounter (Signed)
Not familiar with this case, but looks like we've only ever given him 30 tabs and he hasn't been on it in almost a year. Will give 30 but he will need to be evaluated for further refills. Will keep this in Dr. Henriette Combs box for her as [place]ain Childes when she gets back into town

## 2016-10-22 ENCOUNTER — Encounter: Payer: Self-pay | Admitting: Family Medicine

## 2016-10-22 MED ORDER — SPIRONOLACTONE 25 MG PO TABS: 25.0000 mg | ORAL_TABLET | Freq: Every day | ORAL | 1 refills | Status: DC

## 2016-10-22 NOTE — Telephone Encounter (Signed)
Previous Rxs reviewed- had several refills still from previous provider, should have just run out. New Rx sent to his pharmacy.

## 2016-11-02 ENCOUNTER — Other Ambulatory Visit: Payer: Self-pay | Admitting: Family Medicine

## 2016-11-06 ENCOUNTER — Other Ambulatory Visit: Payer: Self-pay | Admitting: Family Medicine

## 2016-11-06 MED ORDER — TRAMADOL HCL 50 MG PO TABS: 50.0000 mg | ORAL_TABLET | Freq: Two times a day (BID) | ORAL | 0 refills | Status: DC | PRN

## 2016-11-14 ENCOUNTER — Other Ambulatory Visit: Payer: Self-pay | Admitting: Family Medicine

## 2016-11-22 ENCOUNTER — Other Ambulatory Visit: Payer: Self-pay | Admitting: Family Medicine

## 2016-11-22 ENCOUNTER — Telehealth: Payer: Self-pay | Admitting: Family Medicine

## 2016-11-22 NOTE — Telephone Encounter (Signed)
Patient's wife would like to set up physical therapy for patient in his home due to patient having a difficult time walking.   Please Advise.  Thank you

## 2016-11-23 NOTE — Telephone Encounter (Signed)
That sounds great, I had offered it earlier, so I think it's mentioned in a previous note. OK to set up home health for PT for deconditioning. Thanks!

## 2016-11-23 NOTE — Telephone Encounter (Signed)
Clydie Braun returned my call. She said that she should be able to take this patient in and see him next week.   Referral form filled out, signed by Dr. Laural Benes and faxed to [facility].   Called and let the patient's wife know that this was done and that [facility] would call them next week.

## 2016-11-23 NOTE — Telephone Encounter (Signed)
Called and spoke to [place] with [facility]. I asked her if they see [employer] patient's and she states that she is going to check. She stated that she was on the way back to her office and that she would call me back shortly.

## 2016-11-23 NOTE — Telephone Encounter (Signed)
Routing to provider. Is this OK to do?

## 2016-12-12 ENCOUNTER — Telehealth: Payer: Self-pay | Admitting: Family Medicine

## 2016-12-12 NOTE — Telephone Encounter (Signed)
Patient needs the following scripts sent in to [name] Zofran Lexapro  Thanks

## 2016-12-12 NOTE — Telephone Encounter (Signed)
Routing to provider  

## 2016-12-13 MED ORDER — ONDANSETRON 4 MG PO TBDP: 4.0000 mg | ORAL_TABLET | Freq: Three times a day (TID) | ORAL | 1 refills | Status: DC | PRN

## 2016-12-13 MED ORDER — ESCITALOPRAM OXALATE 10 MG PO TABS: 10.0000 mg | ORAL_TABLET | Freq: Every day | ORAL | 1 refills | Status: DC

## 2016-12-17 ENCOUNTER — Emergency Department: Payer: BLUE CROSS/BLUE SHIELD

## 2016-12-17 ENCOUNTER — Inpatient Hospital Stay: Payer: BLUE CROSS/BLUE SHIELD

## 2016-12-17 ENCOUNTER — Other Ambulatory Visit: Payer: Self-pay | Admitting: Radiology

## 2016-12-17 ENCOUNTER — Encounter: Payer: Self-pay | Admitting: Emergency Medicine

## 2016-12-17 ENCOUNTER — Inpatient Hospital Stay
Admission: EM | Admit: 2016-12-17 | Discharge: 2016-12-18 | DRG: 432 | Disposition: A | Discharge status: Home Health | Payer: BLUE CROSS/BLUE SHIELD | Attending: Internal Medicine | Admitting: Internal Medicine

## 2016-12-17 DIAGNOSIS — K7460 Unspecified cirrhosis of liver: Secondary | ICD-10-CM

## 2016-12-17 DIAGNOSIS — E781 Pure hyperglyceridemia: Secondary | ICD-10-CM | POA: Diagnosis present

## 2016-12-17 DIAGNOSIS — R17 Unspecified jaundice: Secondary | ICD-10-CM

## 2016-12-17 DIAGNOSIS — I10 Essential (primary) hypertension: Secondary | ICD-10-CM | POA: Diagnosis present

## 2016-12-17 DIAGNOSIS — Z7951 Long term (current) use of inhaled steroids: Secondary | ICD-10-CM | POA: Diagnosis not present

## 2016-12-17 DIAGNOSIS — E43 Unspecified severe protein-calorie malnutrition: Secondary | ICD-10-CM | POA: Diagnosis present

## 2016-12-17 DIAGNOSIS — K219 Gastro-esophageal reflux disease without esophagitis: Secondary | ICD-10-CM | POA: Diagnosis present

## 2016-12-17 DIAGNOSIS — Z8249 Family history of ischemic heart disease and other diseases of the circulatory system: Secondary | ICD-10-CM | POA: Diagnosis not present

## 2016-12-17 DIAGNOSIS — R188 Other ascites: Secondary | ICD-10-CM

## 2016-12-17 DIAGNOSIS — F17210 Nicotine dependence, cigarettes, uncomplicated: Secondary | ICD-10-CM | POA: Diagnosis present

## 2016-12-17 DIAGNOSIS — Z6823 Body mass index (BMI) 23.0-23.9, adult: Secondary | ICD-10-CM | POA: Diagnosis not present

## 2016-12-17 DIAGNOSIS — F4321 Adjustment disorder with depressed mood: Secondary | ICD-10-CM | POA: Diagnosis present

## 2016-12-17 DIAGNOSIS — A049 Bacterial intestinal infection, unspecified: Secondary | ICD-10-CM | POA: Diagnosis present

## 2016-12-17 DIAGNOSIS — J439 Emphysema, unspecified: Secondary | ICD-10-CM | POA: Diagnosis present

## 2016-12-17 DIAGNOSIS — Z9981 Dependence on supplemental oxygen: Secondary | ICD-10-CM

## 2016-12-17 DIAGNOSIS — K769 Liver disease, unspecified: Secondary | ICD-10-CM | POA: Diagnosis present

## 2016-12-17 DIAGNOSIS — E876 Hypokalemia: Secondary | ICD-10-CM | POA: Diagnosis not present

## 2016-12-17 DIAGNOSIS — K7011 Alcoholic hepatitis with ascites: Secondary | ICD-10-CM | POA: Diagnosis present

## 2016-12-17 DIAGNOSIS — Z825 Family history of asthma and other chronic lower respiratory diseases: Secondary | ICD-10-CM | POA: Diagnosis not present

## 2016-12-17 DIAGNOSIS — Z809 Family history of malignant neoplasm, unspecified: Secondary | ICD-10-CM | POA: Diagnosis not present

## 2016-12-17 DIAGNOSIS — K7469 Other cirrhosis of liver: Secondary | ICD-10-CM

## 2016-12-17 DIAGNOSIS — F329 Major depressive disorder, single episode, unspecified: Secondary | ICD-10-CM | POA: Diagnosis present

## 2016-12-17 DIAGNOSIS — F10239 Alcohol dependence with withdrawal, unspecified: Secondary | ICD-10-CM | POA: Diagnosis present

## 2016-12-17 DIAGNOSIS — J9611 Chronic respiratory failure with hypoxia: Secondary | ICD-10-CM | POA: Diagnosis present

## 2016-12-17 DIAGNOSIS — Z833 Family history of diabetes mellitus: Secondary | ICD-10-CM

## 2016-12-17 DIAGNOSIS — Z23 Encounter for immunization: Secondary | ICD-10-CM

## 2016-12-17 DIAGNOSIS — G629 Polyneuropathy, unspecified: Secondary | ICD-10-CM | POA: Diagnosis present

## 2016-12-17 DIAGNOSIS — L409 Psoriasis, unspecified: Secondary | ICD-10-CM | POA: Diagnosis present

## 2016-12-17 DIAGNOSIS — K7031 Alcoholic cirrhosis of liver with ascites: Secondary | ICD-10-CM

## 2016-12-17 DIAGNOSIS — W19XXXA Unspecified fall, initial encounter: Secondary | ICD-10-CM

## 2016-12-17 LAB — URINALYSIS, COMPLETE (UACMP) WITH MICROSCOPIC
BACTERIA UA: NONE SEEN
RBC / HPF: NONE SEEN RBC/hpf (ref 0–5)
Specific Gravity, Urine: 1.022 (ref 1.005–1.030)

## 2016-12-17 LAB — COMPREHENSIVE METABOLIC PANEL
ALBUMIN: 2.4 g/dL — AB (ref 3.5–5.0)
ALK PHOS: 283 U/L — AB (ref 38–126)
ALT: 55 U/L (ref 17–63)
AST: 195 U/L — AB (ref 15–41)
Anion gap: 11 (ref 5–15)
BUN: 9 mg/dL (ref 6–20)
CALCIUM: 8.5 mg/dL — AB (ref 8.9–10.3)
CHLORIDE: 94 mmol/L — AB (ref 101–111)
CO2: 28 mmol/L (ref 22–32)
CREATININE: 0.63 mg/dL (ref 0.61–1.24)
GFR calc Af Amer: >60 mL/min (ref >60)
GFR calc non Af Amer: >60 mL/min (ref >60)
GLUCOSE: 99 mg/dL (ref 65–99)
Potassium: 3 mmol/L — ABNORMAL LOW (ref 3.5–5.1)
SODIUM: 133 mmol/L — AB (ref 135–145)
Total Bilirubin: 10.8 mg/dL — ABNORMAL HIGH (ref 0.3–1.2)
Total Protein: 8 g/dL (ref 6.5–8.1)

## 2016-12-17 LAB — CBC
HCT: 37 % — ABNORMAL LOW (ref 40.0–52.0)
Hemoglobin: 12.9 g/dL — ABNORMAL LOW (ref 13.0–18.0)
MCH: 43.5 pg — AB (ref 26.0–34.0)
MCHC: 34.7 g/dL (ref 32.0–36.0)
MCV: 125.2 fL — AB (ref 80.0–100.0)
PLATELETS: 152 10*3/uL (ref 150–440)
RBC: 2.96 MIL/uL — AB (ref 4.40–5.90)
RDW: 15.7 % — AB (ref 11.5–14.5)
WBC: 9.8 10*3/uL (ref 3.8–10.6)

## 2016-12-17 LAB — PROTIME-INR
INR: 1.09
Prothrombin Time: 14 seconds (ref 11.4–15.2)

## 2016-12-17 LAB — VITAMIN B12: Vitamin B-12: 1217 pg/mL — ABNORMAL HIGH (ref 180–914)

## 2016-12-17 LAB — BODY FLUID CELL COUNT WITH DIFFERENTIAL
EOS FL: 0 %
LYMPHS FL: 57 %
MONOCYTE-MACROPHAGE-SEROUS FLUID: 24 %
NEUTROPHIL FLUID: 19 %
WBC FLUID: 107 uL

## 2016-12-17 LAB — TROPONIN I: Troponin I: 0.06 ng/mL (ref <0.03)

## 2016-12-17 LAB — TYPE AND SCREEN
ABO/RH(D): A POS
Antibody Screen: NEGATIVE

## 2016-12-17 LAB — ALBUMIN, PLEURAL OR PERITONEAL FLUID: Albumin, Fluid: <1 g/dL

## 2016-12-17 LAB — APTT: APTT: 34 s (ref 24–36)

## 2016-12-17 LAB — PROTEIN, PLEURAL OR PERITONEAL FLUID

## 2016-12-17 LAB — PATHOLOGIST SMEAR REVIEW

## 2016-12-17 LAB — GLUCOSE, PLEURAL OR PERITONEAL FLUID: GLUCOSE FL: 90 mg/dL

## 2016-12-17 LAB — AMMONIA: AMMONIA: 37 umol/L — AB (ref 9–35)

## 2016-12-17 LAB — LIPASE, BLOOD: Lipase: 40 U/L (ref 11–51)

## 2016-12-17 LAB — FERRITIN: FERRITIN: 1211 ng/mL — AB (ref 24–336)

## 2016-12-17 LAB — BILIRUBIN, DIRECT: Bilirubin, Direct: 5.7 mg/dL — ABNORMAL HIGH (ref 0.1–0.5)

## 2016-12-17 MED ORDER — TIOTROPIUM BROMIDE MONOHYDRATE 18 MCG IN CAPS
18.0000 ug | ORAL_CAPSULE | Freq: Every day | RESPIRATORY_TRACT | Status: DC
  Administered 2016-12-18: 09:00:00 18 ug via RESPIRATORY_TRACT
  Filled 2016-12-17: qty 5

## 2016-12-17 MED ORDER — LORAZEPAM 1 MG PO TABS
1.0000 mg | ORAL_TABLET | ORAL | Status: DC | PRN
  Administered 2016-12-17 – 2016-12-18 (×4): 1 mg via ORAL
  Filled 2016-12-17 (×4): qty 1

## 2016-12-17 MED ORDER — LORAZEPAM 2 MG/ML IJ SOLN: 2.0000 mg | INTRAMUSCULAR | Status: DC | PRN

## 2016-12-17 MED ORDER — MIRTAZAPINE 15 MG PO TABS
30.0000 mg | ORAL_TABLET | Freq: Every day | ORAL | Status: DC
  Administered 2016-12-17: 30 mg via ORAL
  Filled 2016-12-17: qty 2

## 2016-12-17 MED ORDER — HEPARIN SODIUM (PORCINE) 5000 UNIT/ML IJ SOLN
5000.0000 [IU] | Freq: Three times a day (TID) | INTRAMUSCULAR | Status: DC
  Administered 2016-12-17 – 2016-12-18 (×3): 5000 [IU] via SUBCUTANEOUS
  Filled 2016-12-17 (×3): qty 1

## 2016-12-17 MED ORDER — VITAMIN B-1 100 MG PO TABS
100.0000 mg | ORAL_TABLET | Freq: Every day | ORAL | Status: DC
  Administered 2016-12-17 – 2016-12-18 (×2): 100 mg via ORAL
  Filled 2016-12-17 (×2): qty 1

## 2016-12-17 MED ORDER — SPIRONOLACTONE 25 MG PO TABS: 25.0000 mg | ORAL_TABLET | Freq: Every day | ORAL | Status: DC

## 2016-12-17 MED ORDER — ADULT MULTIVITAMIN W/MINERALS CH
1.0000 | ORAL_TABLET | Freq: Every day | ORAL | Status: DC
  Administered 2016-12-17 – 2016-12-18 (×2): 1 via ORAL
  Filled 2016-12-17 (×2): qty 1

## 2016-12-17 MED ORDER — PANTOPRAZOLE SODIUM 40 MG PO TBEC
40.0000 mg | DELAYED_RELEASE_TABLET | Freq: Every day | ORAL | Status: DC
  Administered 2016-12-18: 09:00:00 40 mg via ORAL
  Filled 2016-12-17: qty 1

## 2016-12-17 MED ORDER — INFLUENZA VAC SPLIT QUAD 0.5 ML IM SUSY
0.5000 mL | PREFILLED_SYRINGE | INTRAMUSCULAR | Status: AC
Start: 2016-12-18 — End: 2016-12-18
  Administered 2016-12-18: 0.5 mL via INTRAMUSCULAR
  Filled 2016-12-17: qty 0.5

## 2016-12-17 MED ORDER — IPRATROPIUM-ALBUTEROL 0.5-2.5 (3) MG/3ML IN SOLN
3.0000 mL | RESPIRATORY_TRACT | Status: DC
  Administered 2016-12-17 – 2016-12-18 (×3): 3 mL via RESPIRATORY_TRACT
  Filled 2016-12-17 (×3): qty 3

## 2016-12-17 MED ORDER — THEOPHYLLINE ER 300 MG PO TB12
300.0000 mg | ORAL_TABLET | Freq: Two times a day (BID) | ORAL | Status: DC
  Administered 2016-12-17 – 2016-12-18 (×2): 300 mg via ORAL
  Filled 2016-12-17 (×3): qty 1

## 2016-12-17 MED ORDER — NYSTATIN 100000 UNIT/ML MT SUSP
5.0000 mL | Freq: Two times a day (BID) | OROMUCOSAL | Status: DC
  Administered 2016-12-17 – 2016-12-18 (×2): 500000 [IU] via ORAL
  Filled 2016-12-17 (×2): qty 5

## 2016-12-17 MED ORDER — ALBUTEROL SULFATE (2.5 MG/3ML) 0.083% IN NEBU: 2.5000 mg | INHALATION_SOLUTION | RESPIRATORY_TRACT | Status: DC | PRN

## 2016-12-17 MED ORDER — ONDANSETRON HCL 4 MG/2ML IJ SOLN: 4.0000 mg | Freq: Four times a day (QID) | INTRAMUSCULAR | Status: DC | PRN

## 2016-12-17 MED ORDER — NICOTINE 21 MG/24HR TD PT24
21.0000 mg | MEDICATED_PATCH | Freq: Every day | TRANSDERMAL | Status: DC
  Administered 2016-12-17 – 2016-12-18 (×2): 21 mg via TRANSDERMAL
  Filled 2016-12-17 (×2): qty 1

## 2016-12-17 MED ORDER — FOLIC ACID 1 MG PO TABS
1.0000 mg | ORAL_TABLET | Freq: Every day | ORAL | Status: DC
  Administered 2016-12-17 – 2016-12-18 (×2): 1 mg via ORAL
  Filled 2016-12-17 (×2): qty 1

## 2016-12-17 MED ORDER — PREGABALIN 75 MG PO CAPS
75.0000 mg | ORAL_CAPSULE | Freq: Two times a day (BID) | ORAL | Status: DC
  Administered 2016-12-17 – 2016-12-18 (×2): 75 mg via ORAL
  Filled 2016-12-17 (×2): qty 1

## 2016-12-17 MED ORDER — MOMETASONE FURO-FORMOTEROL FUM 200-5 MCG/ACT IN AERO
2.0000 | INHALATION_SPRAY | Freq: Two times a day (BID) | RESPIRATORY_TRACT | Status: DC
  Administered 2016-12-17 – 2016-12-18 (×2): 2 via RESPIRATORY_TRACT
  Filled 2016-12-17: qty 8.8

## 2016-12-17 MED ORDER — FLUTICASONE PROPIONATE 50 MCG/ACT NA SUSP
1.0000 | Freq: Every day | NASAL | Status: DC
  Administered 2016-12-18: 1 via NASAL
  Filled 2016-12-17: qty 16

## 2016-12-17 MED ORDER — THIAMINE HCL 100 MG/ML IJ SOLN
100.0000 mg | Freq: Every day | INTRAMUSCULAR | Status: DC
  Filled 2016-12-17: qty 1

## 2016-12-17 MED ORDER — ESCITALOPRAM OXALATE 10 MG PO TABS
10.0000 mg | ORAL_TABLET | Freq: Every day | ORAL | Status: DC
  Administered 2016-12-18: 10:00:00 10 mg via ORAL
  Filled 2016-12-17: qty 1

## 2016-12-17 MED ORDER — TRAMADOL HCL 50 MG PO TABS: 50.0000 mg | ORAL_TABLET | Freq: Two times a day (BID) | ORAL | Status: DC | PRN

## 2016-12-17 MED ORDER — DOCUSATE SODIUM 100 MG PO CAPS: 100.0000 mg | ORAL_CAPSULE | Freq: Two times a day (BID) | ORAL | Status: DC | PRN

## 2016-12-17 MED ORDER — SODIUM CHLORIDE 0.9 % IV SOLN
1000.0000 mL | Freq: Once | INTRAVENOUS | Status: AC
  Administered 2016-12-17: 1000 mL via INTRAVENOUS

## 2016-12-17 NOTE — Consult Note (Signed)
Cephas Darby, MD [address]  [facility] [place]  [place], [place] [address]  Main: [phone]  Fax: [phone] Pager: [phone]   Consultation  Referring Provider:     No ref. provider found Primary Care Physician:  Valerie Roys, DO Primary Gastroenterologist:  Dr. Tiffany Kocher         Reason for Consultation:     Cirrhosis, diarrhea, abdominal distention  Date of Admission:  12/17/2016 Date of Consultation:  12/17/2016         HPI:   Devin Bryant is a 61 y.o. male history of psoriasis on Humira, alcohol abuse, cirrhosis of liver presented with abdominal distension and jaundice which worsened in last 2-3 weeks. He reports 4-5 episodes of non-bloody emesis 2-3 weeks ago and also diarrhea, dark colored but not black tarry. He is having 4-5 Bms daily and sometimes 2-3 on good days. He denies fever/chills and abdominal pain. He has been taking imodium. He denies swelling of legs. He had ETOH yesterday. Lives with his wife.   He is found to have elevated LFTs, T Bili 11, D Bili 5.7. US doppler was negative for PVT and bile duct obstruction. He had paracentesis and fluid was negative for SBP. He has been taking aldactone as out pt.   He was admitted in 08/2016 secondary to upper extremity cellulitis and was treated Augmentin He was treated with prednisone for alcoholic hepatitis in 08/2701 and responded to steroids very well in the past. His acute hepatitis panel for hep A and B were negative. HIV negative and hepatitis C antibody negative  GI Procedures: None  Past Medical History:  Diagnosis Date  . Adjustment disorder with depressed mood   . Allergy   . Anxiety   . COPD (chronic obstructive pulmonary disease) ([place])   . ED (erectile dysfunction)   . Elevated glucose   . Elevated serum glutamic pyruvic transaminase (SGPT) level   . Emphysema lung ([place])   . GERD (gastroesophageal reflux disease)   . Hypertension   . Hypertriglyceridemia   . Liver disease   . Neuropathy     . Tobacco use     Past Surgical History:  Procedure Laterality Date  . EYE SURGERY     cataract surgery  . KNEE SURGERY     arthroscopic  . SEPTOPLASTY  Feb 2016  . TONSILLECTOMY      Prior to Admission medications   Medication Sig Start Date End Date Taking? Authorizing Provider  ADVAIR HFA 115-21 MCG/ACT inhaler Inhale 2 puffs into the lungs 2 (two) times daily.  10/11/14   [provider]  albuterol (PROVENTIL HFA;VENTOLIN HFA) 108 (90 Base) MCG/ACT inhaler Inhale 2 puffs into the lungs every 6 (six) hours as needed for wheezing or shortness of breath. 04/16/16   Volney American, PA-C  albuterol (PROVENTIL) (2.5 MG/3ML) 0.083% nebulizer solution Take 3 mLs (2.5 mg total) by nebulization every 4 (four) hours as needed for wheezing or shortness of breath. 04/16/16   Volney American, PA-C  Cetirizine HCl (ZYRTEC ALLERGY) 10 MG CAPS Take 10 mg by mouth daily.    [provider]  Diphenhyd-Hydrocort-Nystatin (FIRST-DUKES MOUTHWASH) SUSP Use as directed 5 mLs in the mouth or throat 2 (two) times daily. 09/11/16   Johnson, Megan P, DO  escitalopram (LEXAPRO) 10 MG tablet Take 1 tablet (10 mg total) by mouth daily. 12/13/16   Johnson, Megan P, DO  esomeprazole (NEXIUM) 20 MG capsule Take 20 mg by mouth daily at 12 noon.  [provider]  fluticasone (FLONASE) 50 MCG/ACT nasal spray Place 1 spray into both nostrils daily.  07/23/14   [provider]  lidocaine (XYLOCAINE) 5 % ointment Apply 1 application topically as needed. 08/27/16   Park Liter P, DO  loperamide (IMODIUM) 2 MG capsule Take 1 capsule (2 mg total) by mouth every 6 (six) hours as needed for diarrhea or loose stools. 08/20/16   Dustin Flock, MD  mirtazapine (REMERON) 30 MG tablet Take 1 tablet (30 mg total) by mouth at bedtime. 09/11/16   Park Liter P, DO  Multiple Vitamin (MULTIVITAMIN) tablet Take 1 tablet by mouth daily.    [provider]  nystatin (MYCOSTATIN)  100000 UNIT/ML suspension Take 5 mLs by mouth 2 (two) times daily. Uses with his inhalers 02/08/15   [provider]  ondansetron (ZOFRAN ODT) 4 MG disintegrating tablet Take 1 tablet (4 mg total) by mouth every 8 (eight) hours as needed for nausea or vomiting. 12/13/16   Johnson, Megan P, DO  pregabalin (LYRICA) 75 MG capsule Take 1 capsule (75 mg total) by mouth 2 (two) times daily. 09/11/16 09/11/17  Park Liter P, DO  spironolactone (ALDACTONE) 25 MG tablet Take 1 tablet (25 mg total) by mouth daily. 10/22/16   Johnson, Megan P, DO  spironolactone (ALDACTONE) 25 MG tablet TAKE 1 TABLET BY MOUTH EVERY DAY 11/15/16   Johnson, Megan P, DO  theophylline (THEODUR) 300 MG 12 hr tablet Take 300 mg by mouth 2 (two) times daily. 03/02/15   [provider]  tiotropium (SPIRIVA HANDIHALER) 18 MCG inhalation capsule Place 1 capsule (18 mcg total) into inhaler and inhale daily. 10/19/14   Hillary Bow, MD  traMADol (ULTRAM) 50 MG tablet Take 1 tablet (50 mg total) by mouth 2 (two) times daily as needed. 11/06/16   Valerie Roys, DO    Family History  Problem Relation Age of Onset  . Diabetes Mother   . CAD Mother   . Cancer Father        lung  . Heart disease Father   . Heart attack Father   . COPD Sister   . Heart disease Brother   . Heart attack Brother   . COPD Brother   . Hypertension Neg Hx   . Stroke Neg Hx      Social History  Substance Use Topics  . Smoking status: Current Every Day Smoker    Packs/day: 1.00    Years: 40.00    Types: Cigarettes  . Smokeless tobacco: Never Used  . Alcohol use Yes     Comment: daughter states patient has been drinking heavily for the past three years with increased drinking over the past 6 months.    Allergies as of 12/17/2016  . (No Known Allergies)    Review of Systems:    All systems reviewed and negative except where noted in HPI.   Physical Exam:  Vital signs in last 24 hours: Temp:  [97.6 F (36.4 C)-99 F (37.2  C)] 99 F (37.2 C) (10/15 1332) Pulse Rate:  [121-133] 122 (10/15 1332) Resp:  [15-19] 16 (10/15 1211) BP: (103-136)/(64-88) 136/77 (10/15 1332) SpO2:  [95 %-99 %] 98 % (10/15 1332) FiO2 (%):  [2 %] 2 % (10/15 1304) Weight:  [165 lb (74.8 kg)] 165 lb (74.8 kg) (10/15 0819) Last BM Date: 12/16/16 General:   Pleasant, cooperative in NAD, appears yellow Head:  Normocephalic and atraumatic. Eyes:  Deep icterus.   Conjunctiva pink. PERRLA. Ears:  Normal auditory  acuity. Neck:  Supple; no masses or thyroidomegaly Lungs: Respirations even and unlabored. Lungs clear to auscultation bilaterally.   No wheezes, crackles, or rhonchi.  Heart:  Regular rate and rhythm;  Without murmur, clicks, rubs or gallops Abdomen:  Soft, diffusely distended, tympanic, nontender. Normal bowel sounds. No appreciable masses or hepatomegaly.  No rebound or guarding.  Rectal:  Not performed. Msk:  Symmetrical without gross deformities.  Extremities:  Without edema, cyanosis or clubbing. Neurologic:  Alert and oriented x3;  grossly normal neurologically, asterixis present Skin:  Intact without significant lesions or rashes. Psych:  Alert and cooperative. Normal affect.  LAB RESULTS: CBC Latest Ref Rng & Units 12/17/2016 08/20/2016 08/18/2016  WBC 3.8 - 10.6 K/uL 9.8 6.5 7.1  Hemoglobin 13.0 - 18.0 g/dL 12.9(L) 12.9(L) 12.5(L)  Hematocrit 40.0 - 52.0 % 37.0(L) 37.9(L) 35.8(L)  Platelets 150 - 440 K/uL 152 127(L) 127(L)    BMET BMP Latest Ref Rng & Units 12/17/2016 08/20/2016 08/18/2016  Glucose 65 - 99 mg/dL 99 81 115(H)  BUN 6 - 20 mg/dL 9 <5(L) <5(L)  Creatinine 0.61 - 1.24 mg/dL 0.63 0.59(L) 0.59(L)  BUN/Creat Ratio 10 - 24 - - -  Sodium 135 - 145 mmol/L 133(L) 137 138  Potassium 3.5 - 5.1 mmol/L 3.0(L) 2.7(LL) 3.6  Chloride 101 - 111 mmol/L 94(L) 97(L) 102  CO2 22 - 32 mmol/L _0 Calcium 8.9 - 10.3 mg/dL 8.5(L) 8.5(L) 8.4(L)    LFT Hepatic Function Latest Ref Rng & Units 12/17/2016 08/18/2016  08/18/2016  Total Protein 6.5 - 8.1 g/dL 8.0 6.3(L) 7.5  Albumin 3.5 - 5.0 g/dL 2.4(L) 3.2(L) 3.9  AST 15 - 41 U/L 195(H) 197(H) 237(H)  ALT 17 - 63 U/L 55 112(H) 137(H)  Alk Phosphatase 38 - 126 U/L 283(H) 96 111  Total Bilirubin 0.3 - 1.2 mg/dL 10.8(H) 2.2(H) 1.4(H)  Bilirubin, Direct 0.1 - 0.5 mg/dL 5.7(H) - -     STUDIES: Dg Chest 2 View  Result Date: 12/17/2016 CLINICAL DATA:  Vomiting and diarrhea. EXAM: CHEST  2 VIEW COMPARISON:  Chest x-ray dated August 18, 2016. FINDINGS: The cardiomediastinal silhouette is normal in size. Normal pulmonary vascularity. Trace left pleural effusion. No focal consolidation or pneumothorax. No acute osseous abnormality. IMPRESSION: Trace left pleural effusion. Electronically Signed   By: Titus Dubin M.D.   On: 12/17/2016 09:49   US Paracentesis  Result Date: 12/17/2016 INDICATION: Hepatic cirrhosis and ascites EXAM: ULTRASOUND GUIDED  PARACENTESIS MEDICATIONS: None. COMPLICATIONS: None immediate. PROCEDURE: Informed written consent was obtained from the patient after a discussion of the risks, benefits and alternatives to treatment. A timeout was performed prior to the initiation of the procedure. Initial ultrasound scanning demonstrates a large amount of ascites within the right lower abdominal quadrant. The right lower abdomen was prepped and draped in the usual sterile fashion. 1% lidocaine was used for local anesthesia. Following this, a 6 Fr Safe-T-Centesis catheter was introduced. An ultrasound image was saved for documentation purposes. The paracentesis was performed. Initially 1 L of fluid was withdrawn without difficulty. Drainage then became somewhat difficult due to mobile omentum along the margin of the liver. The catheter was attempted to be repositioned which was unsuccessful. It was subsequently removed and a new 6 [place] Safe-T-Centesis catheter placed along the margin of the liver. An additional 1.6 L was then removed. The catheter was  removed and a dressing was applied. The patient tolerated the procedure well without immediate post procedural complication. FINDINGS: A total of approximately 2.6  L of bilirubin stained fluid was removed. Samples were sent to the laboratory as requested by the clinical team. IMPRESSION: Successful ultrasound-guided paracentesis yielding 2.6 liters of peritoneal fluid. A small amount of residual fluid is noted along the liver although the majority of the fluid from the more inferior abdomen has resolved. Mobile omentum was limiting drainage to completion. Electronically Signed   By: Inez Catalina M.D.   On: 12/17/2016 13:08   US Abdomen Limited Ruq  Result Date: 12/17/2016 CLINICAL DATA:  Cirrhosis.  Evaluate portal vein patency EXAM: ULTRASOUND ABDOMEN LIMITED RIGHT UPPER QUADRANT COMPARISON:  Ultrasound 08/18/2016 FINDINGS: Gallbladder: Gallbladder sludge without gallstones. Gallbladder wall 2 mm. Negative sonographic Murphy sign Common bile duct: Diameter: 6 mm Liver: Increased echogenicity of the liver diffusely compatible with chronic liver disease and cirrhosis. Moderate amount of ascites. Portal vein is patent on color Doppler imaging with normal direction of blood flow towards the liver. IMPRESSION: Gallbladder sludge without stone Cirrhotic liver with ascites. Portal vein patent with normal flow direction. Electronically Signed   By: Franchot Gallo M.D.   On: 12/17/2016 10:52      Impression / Plan:   Devin Bryant is a 61 y.o. male with heavy ETOH use, COPD, prior h/o alc hep, admitted with decompensated cirrhosis, ascites and 3 weeks of non-bloody diarrhea in the setting of ongoing ETOH use  Cirrhosis: Most likely ETOH related. Given that he responded very well to prednisone in the past, would like to evaluate for auto-immune etiology as well Check ANA, ASMA, AMA Ascitic fluid negative for SBP Recommend lasix 36m and aldactone 561mas out pt given new onset of ascites Recommend low  sodium diet EGD for variceal screening as out pt [place] with no liver lesions, check AFP  Acute onset of diarrhea: likely infectious, viral or bacterial gastroenteritis - check stool studies including C Diff and GI pathogen panel   Thank you for involving me in the care of this patient.      LOS: 0 days   RoSherri SearMD  12/17/2016, 6:18 PM   Note: This dictation was prepared with Dragon dictation along with smaller phrase technology. Any transcriptional errors that result from this process are unintentional.

## 2016-12-17 NOTE — H&P (Addendum)
[facility] [place] [facility]   PATIENT NAME: Devin Bryant    MR#:  [id number]  DATE OF BIRTH:  Mar 08, 1955  DATE OF ADMISSION:  12/17/2016  PRIMARY CARE PHYSICIAN: Dorcas Carrow, DO   REQUESTING/REFERRING PHYSICIAN: Dr.Kinner  CHIEF COMPLAINT:   Chief Complaint  Patient presents with  . Emesis  . Diarrhea    HISTORY OF PRESENT ILLNESS: Devin Bryant  is a 61 y.o. male with a known history of COPD, Neuropathy, Htn, Chronic alcoholism and Liver cirrhosis, still continued drinking alcohol- started having swelling on his abdomen, decreased appetite and nausea for last 2 weeks. Also noted yellowing of his skin. Have c/o On-off diarrhea, with constipation alternating. Also worried about alcohol withdrawal. He noted at tip of his right great toe- he have black discoloration, which is not painful due to his neuropathy, no injuries, but it is expanding in size. Have generalized weakness and weight loss, also had a few falls without major injuries at home in last few weeks.  PAST MEDICAL HISTORY:   Past Medical History:  Diagnosis Date  . Adjustment disorder with depressed mood   . Allergy   . Anxiety   . COPD (chronic obstructive pulmonary disease) (HCC)   . ED (erectile dysfunction)   . Elevated glucose   . Elevated serum glutamic pyruvic transaminase (SGPT) level   . Emphysema lung (HCC)   . GERD (gastroesophageal reflux disease)   . Hypertension   . Hypertriglyceridemia   . Liver disease   . Neuropathy   . Tobacco use     PAST SURGICAL HISTORY: Past Surgical History:  Procedure Laterality Date  . EYE SURGERY     cataract surgery  . KNEE SURGERY     arthroscopic  . SEPTOPLASTY  Feb 2016  . TONSILLECTOMY      SOCIAL HISTORY:  Social History  Substance Use Topics  . Smoking status: Current Every Day Smoker    Packs/day: 1.00    Years: 40.00    Types: Cigarettes  . Smokeless tobacco: Never Used  . Alcohol use Yes     Comment: daughter  states patient has been drinking heavily for the past three years with increased drinking over the past 6 months.    FAMILY HISTORY:  Family History  Problem Relation Age of Onset  . Diabetes Mother   . CAD Mother   . Cancer Father        lung  . Heart disease Father   . Heart attack Father   . COPD Sister   . Heart disease Brother   . Heart attack Brother   . COPD Brother   . Hypertension Neg Hx   . Stroke Neg Hx     DRUG ALLERGIES: No Known Allergies  REVIEW OF SYSTEMS:   CONSTITUTIONAL: No fever, positive for fatigue or weakness.  EYES: No blurred or double vision.  EARS, NOSE, AND THROAT: No tinnitus or ear pain.  RESPIRATORY: No cough, shortness of breath, wheezing or hemoptysis.  CARDIOVASCULAR: No chest pain, orthopnea, edema.  GASTROINTESTINAL: positive for nausea,no vomiting, diarrhea or abdominal pain. Have abdominal distension. GENITOURINARY: No dysuria, hematuria.  ENDOCRINE: No polyuria, nocturia,  HEMATOLOGY: No anemia, easy bruising or bleeding SKIN: No rash or lesion. MUSCULOSKELETAL: No joint pain or arthritis.   NEUROLOGIC: No tingling, numbness, weakness.  PSYCHIATRY: No anxiety or depression.   MEDICATIONS AT HOME:  Prior to Admission medications   Medication Sig Start Date End Date Taking? Authorizing Provider  ADVAIR HFA [phone]  MCG/ACT inhaler Inhale 2 puffs into the lungs 2 (two) times daily.  10/11/14  Yes [provider]  Cetirizine HCl (ZYRTEC ALLERGY) 10 MG CAPS Take 10 mg by mouth daily.   Yes [provider]  escitalopram (LEXAPRO) 10 MG tablet Take 1 tablet (10 mg total) by mouth daily. 12/13/16  Yes Johnson, Megan P, DO  esomeprazole (NEXIUM) 20 MG capsule Take 20 mg by mouth daily at 12 noon.   Yes [provider]  fluticasone (FLONASE) 50 MCG/ACT nasal spray Place 1 spray into both nostrils daily.  07/23/14  Yes [provider]  loperamide (IMODIUM) 2 MG capsule Take 1 capsule (2 mg total) by mouth every 6  (six) hours as needed for diarrhea or loose stools. 08/20/16  Yes Auburn Bilberry, MD  mirtazapine (REMERON) 30 MG tablet Take 1 tablet (30 mg total) by mouth at bedtime. 09/11/16  Yes Johnson, Megan P, DO  Multiple Vitamin (MULTIVITAMIN) tablet Take 1 tablet by mouth daily.   Yes [provider]  pregabalin (LYRICA) 75 MG capsule Take 1 capsule (75 mg total) by mouth 2 (two) times daily. 09/11/16 09/11/17 Yes Johnson, Megan P, DO  spironolactone (ALDACTONE) 25 MG tablet Take 1 tablet (25 mg total) by mouth daily. 10/22/16  Yes Johnson, Megan P, DO  theophylline (THEODUR) 300 MG 12 hr tablet Take 300 mg by mouth 2 (two) times daily. 03/02/15  Yes [provider]  tiotropium (SPIRIVA HANDIHALER) 18 MCG inhalation capsule Place 1 capsule (18 mcg total) into inhaler and inhale daily. 10/19/14  Yes Sudini, Wardell Heath, MD  traMADol (ULTRAM) 50 MG tablet Take 1 tablet (50 mg total) by mouth 2 (two) times daily as needed. 11/06/16  Yes Johnson, Megan P, DO  albuterol (PROVENTIL HFA;VENTOLIN HFA) 108 (90 Base) MCG/ACT inhaler Inhale 2 puffs into the lungs every 6 (six) hours as needed for wheezing or shortness of breath. 04/16/16   Particia Nearing, PA-C  albuterol (PROVENTIL) (2.5 MG/3ML) 0.083% nebulizer solution Take 3 mLs (2.5 mg total) by nebulization every 4 (four) hours as needed for wheezing or shortness of breath. 04/16/16   Particia Nearing, PA-C  Diphenhyd-Hydrocort-Nystatin (FIRST-DUKES MOUTHWASH) SUSP Use as directed 5 mLs in the mouth or throat 2 (two) times daily. Patient not taking: Reported on 12/17/2016 09/11/16   Olevia Perches P, DO  lidocaine (XYLOCAINE) 5 % ointment Apply 1 application topically as needed. Patient not taking: Reported on 12/17/2016 08/27/16   Olevia Perches P, DO  nystatin (MYCOSTATIN) 100000 UNIT/ML suspension Take 5 mLs by mouth 2 (two) times daily. Uses with his inhalers 02/08/15   [provider]  ondansetron (ZOFRAN ODT) 4 MG disintegrating  tablet Take 1 tablet (4 mg total) by mouth every 8 (eight) hours as needed for nausea or vomiting. 12/13/16   Johnson, Megan P, DO  spironolactone (ALDACTONE) 25 MG tablet TAKE 1 TABLET BY MOUTH EVERY DAY Patient not taking: Reported on 12/17/2016 11/15/16   Olevia Perches P, DO      PHYSICAL EXAMINATION:   VITAL SIGNS: Blood pressure 129/79, pulse (!) 122, temperature 97.6 F (36.4 C), temperature source Oral, resp. rate 17, height  (1.778 m), weight 74.8 kg (165 lb), SpO2 97 %.  GENERAL:  61 y.o.-year-old patient lying in the bed with no acute distress.  EYES: Pupils equal, round, reactive to light and accommodation. No scleral icterus. Extraocular muscles intact.  HEENT: Head atraumatic, normocephalic. Oropharynx and nasopharynx clear.  NECK:  Supple, no jugular venous distention. No thyroid enlargement,  no tenderness.  LUNGS: Normal breath sounds bilaterally, no wheezing, rales,rhonchi or crepitation. No use of accessory muscles of respiration.  CARDIOVASCULAR: S1, S2 normal. No murmurs, rubs, or gallops.  ABDOMEN: Soft, nontender, distended. Bowel sounds present. No organomegaly or mass.  EXTREMITIES: No pedal edema, cyanosis, or clubbing. At the tip of right great toe- 1 cm X 0.5 cm size black and dry discoloration, without surrounding redness or any discharge. Palpable dorsalis pedis pulse. NEUROLOGIC: Cranial nerves II through XII are intact. Muscle strength 4/5 in all extremities. Sensation intact. Gait not checked. Some tremors on hands and feet. PSYCHIATRIC: The patient is alert and oriented x 3.  SKIN: No obvious rash, lesion, or ulcer.   LABORATORY PANEL:   CBC  Recent Labs Lab 12/17/16 0826  WBC 9.8  HGB 12.9*  HCT 37.0*  PLT 152  MCV 125.2*  MCH 43.5*  MCHC 34.7  RDW 15.7*   ------------------------------------------------------------------------------------------------------------------  Chemistries   Recent Labs Lab 12/17/16 0826  NA 133*  K 3.0*   CL 94*  CO2 28  GLUCOSE 99  BUN 9  CREATININE 0.63  CALCIUM 8.5*  AST 195*  ALT 55  ALKPHOS 283*  BILITOT 10.8*   ------------------------------------------------------------------------------------------------------------------ estimated creatinine clearance is 97.6 mL/min (by C-G formula based on SCr of 0.63 mg/dL). ------------------------------------------------------------------------------------------------------------------ No results for input(s): TSH, T4TOTAL, T3FREE, THYROIDAB in the last 72 hours.  Invalid input(s): FREET3   Coagulation profile No results for input(s): INR, PROTIME in the last 168 hours. ------------------------------------------------------------------------------------------------------------------- No results for input(s): DDIMER in the last 72 hours. -------------------------------------------------------------------------------------------------------------------  Cardiac Enzymes  Recent Labs Lab 12/17/16 0826  TROPONINI 0.06*   ------------------------------------------------------------------------------------------------------------------ Invalid input(s): POCBNP  ---------------------------------------------------------------------------------------------------------------  Urinalysis    Component Value Date/Time   COLORURINE BROWN (A) 10/05/2015 1137   APPEARANCEUR Clear 12/08/2015 1406   LABSPEC 1.025 10/05/2015 1137   PHURINE  10/05/2015 1137    TEST NOT REPORTED DUE TO COLOR INTERFERENCE OF URINE PIGMENT   GLUCOSEU Negative 12/08/2015 1406   HGBUR (A) 10/05/2015 1137    TEST NOT REPORTED DUE TO COLOR INTERFERENCE OF URINE PIGMENT   BILIRUBINUR Negative 12/08/2015 1406   KETONESUR (A) 10/05/2015 1137    TEST NOT REPORTED DUE TO COLOR INTERFERENCE OF URINE PIGMENT   PROTEINUR 1+ (A) 12/08/2015 1406   PROTEINUR (A) 10/05/2015 1137    TEST NOT REPORTED DUE TO COLOR INTERFERENCE OF URINE PIGMENT   NITRITE Negative 12/08/2015  1406   NITRITE (A) 10/05/2015 1137    TEST NOT REPORTED DUE TO COLOR INTERFERENCE OF URINE PIGMENT   LEUKOCYTESUR Negative 12/08/2015 1406     RADIOLOGY: Dg Chest 2 View  Result Date: 12/17/2016 CLINICAL DATA:  Vomiting and diarrhea. EXAM: CHEST  2 VIEW COMPARISON:  Chest x-ray dated August 18, 2016. FINDINGS: The cardiomediastinal silhouette is normal in size. Normal pulmonary vascularity. Trace left pleural effusion. No focal consolidation or pneumothorax. No acute osseous abnormality. IMPRESSION: Trace left pleural effusion. Electronically Signed   By: Obie Dredge M.D.   On: 12/17/2016 09:49   US Abdomen Limited Ruq  Result Date: 12/17/2016 CLINICAL DATA:  Cirrhosis.  Evaluate portal vein patency EXAM: ULTRASOUND ABDOMEN LIMITED RIGHT UPPER QUADRANT COMPARISON:  Ultrasound 08/18/2016 FINDINGS: Gallbladder: Gallbladder sludge without gallstones. Gallbladder wall 2 mm. Negative sonographic Murphy sign Common bile duct: Diameter: 6 mm Liver: Increased echogenicity of the liver diffusely compatible with chronic liver disease and cirrhosis. Moderate amount of ascites. Portal vein is patent on color Doppler imaging with normal direction of blood flow towards  the liver. IMPRESSION: Gallbladder sludge without stone Cirrhotic liver with ascites. Portal vein patent with normal flow direction. Electronically Signed   By: Marlan Palau M.D.   On: 12/17/2016 10:52    EKG: Orders placed or performed during the hospital encounter of 12/17/16  . EKG 12-Lead  . EKG 12-Lead    IMPRESSION AND PLAN:  * Alcoholic hepatitis with ascites   Jaundice      Iv Fluids, Thiamin and folic acid   US guided paracentesis.   Send labs from the fluid, I Spoke to [employer] physician.   Gi consult.   After checking PT, will decide- if need steroids or not.  * Alcohol dependence and high risk for withdrawal   Keep on CIWA protocol.   IV and Oral Ativan as needed.   IV fluids.  * LIver cirrhosis   On  Spironolactone.    Ammonia is not high,     Not taking lactulose at home.    Cont spironolactone.    Still cont to drink alcohol at home.  * Htn   Cont home meds, stable.  * COPD with ch respi failure on home o2   No wheezing    Cont spiriva, Dulera, give duoneb as needed.  * Active smoking   Councelled to quit smoking fro 4 min, Offered nicotine patch.  All the records are reviewed and case discussed with ED provider. Management plans discussed with the patient, family and they are in agreement.  CODE STATUS: Full. Code Status History    Date Active Date Inactive Code Status Order ID Comments User Context   08/18/2016  9:27 PM 08/20/2016  3:13 PM Full Code [id number]  Ramonita Lab, MD ED   08/18/2016  2:00 PM 08/18/2016  9:27 PM Full Code [id number]  Willy Eddy, MD ED   02/03/2016  2:01 PM 02/04/2016  2:52 PM Full Code [id number]  Enedina Finner, MD Inpatient   01/15/2016  8:29 PM 01/17/2016  3:32 PM Full Code [id number]  Katha Hamming, MD ED   10/05/2015  3:13 PM 10/08/2015  4:53 PM Full Code [id number]  Alford Highland, MD ED   06/27/2015  2:36 PM 06/29/2015  2:34 PM Full Code [id number]  Katha Hamming, MD ED   03/11/2015  5:29 PM 03/14/2015  3:59 PM Full Code [id number]  Dorothea Ogle, MD Inpatient   10/17/2014  2:31 PM 10/19/2014  2:34 PM Full Code [id number]  Shaune Pollack, MD Inpatient     Wife is in the room during my visit.  TOTAL TIME TAKING CARE OF THIS PATIENT: 55 minutes.    Altamese Dilling M.D on 12/17/2016   Between 7am to 6pm - Pager - [phone]  After 6pm go to [email or web address] - password [email or web address]  [facility]  Office  [phone]  CC: Primary care physician; Dorcas Carrow, DO   Note: This dictation was prepared with Dragon dictation along with smaller phrase technology. Any transcriptional errors that result from this process are unintentional.

## 2016-12-17 NOTE — ED Notes (Addendum)
Per MD Elisabeth Pigeon ok to given  ativan PO due to pt anxiety about paracentesis. PT scored 4 CIWAA , provider aware

## 2016-12-17 NOTE — ED Notes (Signed)
Date and time results received: 12/17/16  (use smartphrase ".now" to insert current time)  Test:  troponin Critical Value: 0.06  Name of Provider Notified: Cyril Loosen

## 2016-12-17 NOTE — ED Notes (Signed)
Pt unable to give urine sample at this time, will try again

## 2016-12-17 NOTE — ED Notes (Signed)
Last drink was last night , drinks 4-5 glasses of wine daily

## 2016-12-17 NOTE — ED Provider Notes (Signed)
[facility] Emergency Department Provider Note   ____________________________________________    I have reviewed the triage vital signs and the nursing notes.   HISTORY  Chief Complaint Emesis and Diarrhea     HPI Devin Bryant is a 61 y.o. male Who presents with weakness. Patient reports he has had nausea and diarrhea for nearly a month now. He reports over the last 2 weeks he has had worsening distention of his abdomen, he does have a history of cirrhosis. Additionally he notes that his skin has become more yellow. He has been drinking, last drank last night. Denies fevers or chills. Does have a productive cough. Follows with Dr. Mechele Collin of GI   Past Medical History:  Diagnosis Date  . Adjustment disorder with depressed mood   . Allergy   . Anxiety   . COPD (chronic obstructive pulmonary disease) (HCC)   . ED (erectile dysfunction)   . Elevated glucose   . Elevated serum glutamic pyruvic transaminase (SGPT) level   . Emphysema lung (HCC)   . GERD (gastroesophageal reflux disease)   . Hypertension   . Hypertriglyceridemia   . Liver disease   . Neuropathy   . Tobacco use     Patient Active Problem List   Diagnosis Date Noted  . Cellulitis of arm, left 08/18/2016  . Acute on chronic respiratory failure with hypoxia (HCC) 02/03/2016  . Neuropathy 01/30/2016  . Acute on chronic respiratory failure with hypoxia and hypercapnia (HCC) 01/17/2016  . Nonsustained ventricular tachycardia (HCC) 01/17/2016  . Leukocytosis 01/17/2016  . History of tobacco abuse 01/17/2016  . Alcohol abuse 01/17/2016  . Liver cirrhosis, alcoholic (HCC) 12/08/2015  . Alcoholic hepatitis 10/10/2015  . Encephalopathy, hepatic (HCC) 10/10/2015  . Anxiety and depression 10/10/2015  . Hypokalemia 10/10/2015  . Psoriasis 10/10/2015  . Alcohol dependence with withdrawal with complication (HCC)   . Acute on chronic respiratory failure (HCC) 06/27/2015  . Macrocytosis  without anemia 04/11/2015  . Tobacco abuse 10/19/2014  . Generalized anxiety disorder 10/17/2014  . Allergy   . Hypertension   . COPD (chronic obstructive pulmonary disease) (HCC)   . ED (erectile dysfunction)   . Hypertriglyceridemia   . Elevated glucose   . Elevated serum glutamic pyruvic transaminase (SGPT) level     Past Surgical History:  Procedure Laterality Date  . EYE SURGERY     cataract surgery  . KNEE SURGERY     arthroscopic  . SEPTOPLASTY  Feb 2016  . TONSILLECTOMY      Prior to Admission medications   Medication Sig Start Date End Date Taking? Authorizing Provider  ADVAIR HFA 115-21 MCG/ACT inhaler Inhale 2 puffs into the lungs 2 (two) times daily.  10/11/14  Yes [provider]  Cetirizine HCl (ZYRTEC ALLERGY) 10 MG CAPS Take 10 mg by mouth daily.   Yes [provider]  escitalopram (LEXAPRO) 10 MG tablet Take 1 tablet (10 mg total) by mouth daily. 12/13/16  Yes Johnson, Megan P, DO  esomeprazole (NEXIUM) 20 MG capsule Take 20 mg by mouth daily at 12 noon.   Yes [provider]  fluticasone (FLONASE) 50 MCG/ACT nasal spray Place 1 spray into both nostrils daily.  07/23/14  Yes [provider]  loperamide (IMODIUM) 2 MG capsule Take 1 capsule (2 mg total) by mouth every 6 (six) hours as needed for diarrhea or loose stools. 08/20/16  Yes Auburn Bilberry, MD  mirtazapine (REMERON) 30 MG tablet Take 1 tablet (30 mg total) by mouth  at bedtime. 09/11/16  Yes Johnson, Megan P, DO  Multiple Vitamin (MULTIVITAMIN) tablet Take 1 tablet by mouth daily.   Yes [provider]  pregabalin (LYRICA) 75 MG capsule Take 1 capsule (75 mg total) by mouth 2 (two) times daily. 09/11/16 09/11/17 Yes Johnson, Megan P, DO  spironolactone (ALDACTONE) 25 MG tablet Take 1 tablet (25 mg total) by mouth daily. 10/22/16  Yes Johnson, Megan P, DO  theophylline (THEODUR) 300 MG 12 hr tablet Take 300 mg by mouth 2 (two) times daily. 03/02/15  Yes [provider]  tiotropium (SPIRIVA HANDIHALER) 18 MCG inhalation capsule Place 1 capsule (18 mcg total) into inhaler and inhale daily. 10/19/14  Yes Sudini, Wardell Heath, MD  traMADol (ULTRAM) 50 MG tablet Take 1 tablet (50 mg total) by mouth 2 (two) times daily as needed. 11/06/16  Yes Johnson, Megan P, DO  albuterol (PROVENTIL HFA;VENTOLIN HFA) 108 (90 Base) MCG/ACT inhaler Inhale 2 puffs into the lungs every 6 (six) hours as needed for wheezing or shortness of breath. 04/16/16   Particia Nearing, PA-C  albuterol (PROVENTIL) (2.5 MG/3ML) 0.083% nebulizer solution Take 3 mLs (2.5 mg total) by nebulization every 4 (four) hours as needed for wheezing or shortness of breath. 04/16/16   Particia Nearing, PA-C  Diphenhyd-Hydrocort-Nystatin (FIRST-DUKES MOUTHWASH) SUSP Use as directed 5 mLs in the mouth or throat 2 (two) times daily. Patient not taking: Reported on 12/17/2016 09/11/16   Olevia Perches P, DO  lidocaine (XYLOCAINE) 5 % ointment Apply 1 application topically as needed. Patient not taking: Reported on 12/17/2016 08/27/16   Olevia Perches P, DO  nystatin (MYCOSTATIN) 100000 UNIT/ML suspension Take 5 mLs by mouth 2 (two) times daily. Uses with his inhalers 02/08/15   [provider]  ondansetron (ZOFRAN ODT) 4 MG disintegrating tablet Take 1 tablet (4 mg total) by mouth every 8 (eight) hours as needed for nausea or vomiting. 12/13/16   Johnson, Megan P, DO  spironolactone (ALDACTONE) 25 MG tablet TAKE 1 TABLET BY MOUTH EVERY DAY Patient not taking: Reported on 12/17/2016 11/15/16   Dorcas Carrow, DO     Allergies Patient has no known allergies.  Family History  Problem Relation Age of Onset  . Diabetes Mother   . CAD Mother   . Cancer Father        lung  . Heart disease Father   . Heart attack Father   . COPD Sister   . Heart disease Brother   . Heart attack Brother   . COPD Brother   . Hypertension Neg Hx   . Stroke Neg Hx     Social History Social History    Substance Use Topics  . Smoking status: Current Every Day Smoker    Packs/day: 1.00    Years: 40.00    Types: Cigarettes  . Smokeless tobacco: Never Used  . Alcohol use Yes     Comment: daughter states patient has been drinking heavily for the past three years with increased drinking over the past 6 months.    Review of Systems  Constitutional: No fever/chills Eyes: No visual changes.  ENT: No sore throat. Cardiovascular: Denies chest pain. Respiratory: Denies shortness of breath.productive cough as above Gastrointestinal: as above Genitourinary: Negative for dysuria. Musculoskeletal: Negative for back pain. Skin: yellow skin Neurological: Negative for headaches or weakness   ____________________________________________   PHYSICAL EXAM:  VITAL SIGNS: ED Triage Vitals  Enc Vitals Group     BP 12/17/16 0818 103/64  Pulse Rate 12/17/16 0818 (!) 132     Resp 12/17/16 0818 15     Temp 12/17/16 0818 97.6 F (36.4 C)     Temp Source 12/17/16 0818 Oral     SpO2 12/17/16 0818 97 %     Weight 12/17/16 0819 74.8 kg (165 lb)     Height 12/17/16 0819 1.778 m ( )     Head Circumference --      Peak Flow --      Pain Score --      Pain Loc --      Pain Edu? --      Excl. in GC? --     Constitutional: Alert and oriented. No acute distress. Pleasant and interactive Eyes: Conjunctivae are icteric  Nose: No congestion/rhinnorhea. Mouth/Throat: Mucous membranes are dry  Cardiovascular: tachycardia, regular rhythm. Grossly normal heart sounds.  Good peripheral circulation. Respiratory: Normal respiratory effort.  No retractions. Lungs CTAB. Gastrointestinal: Soft and nontender. significant distention, suspect ascites.  No CVA tenderness. Genitourinary: deferred Musculoskeletal: No lower extremity tenderness nor edema.  Warm and well perfused Neurologic:  Normal speech and language. No gross focal neurologic deficits are appreciated.  Skin:  Skin is warm, dry and  intact. jaundiced Psychiatric: Mood and affect are normal. Speech and behavior are normal.  ____________________________________________   LABS (all labs ordered are listed, but only abnormal results are displayed)  Labs Reviewed  COMPREHENSIVE METABOLIC PANEL - Abnormal; Notable for the following:       Result Value   Sodium 133 (*)    Potassium 3.0 (*)    Chloride 94 (*)    Calcium 8.5 (*)    Albumin 2.4 (*)    AST 195 (*)    Alkaline Phosphatase 283 (*)    Total Bilirubin 10.8 (*)    All other components within normal limits  CBC - Abnormal; Notable for the following:    RBC 2.96 (*)    Hemoglobin 12.9 (*)    HCT 37.0 (*)    MCV 125.2 (*)    MCH 43.5 (*)    RDW 15.7 (*)    All other components within normal limits  AMMONIA - Abnormal; Notable for the following:    Ammonia 37 (*)    All other components within normal limits  TROPONIN I - Abnormal; Notable for the following:    Troponin I 0.06 (*)    All other components within normal limits  LIPASE, BLOOD  URINALYSIS, COMPLETE (UACMP) WITH MICROSCOPIC  PROTIME-INR  APTT  TYPE AND SCREEN   ____________________________________________  EKG  ED ECG REPORT I, Jene Every, the attending physician, personally viewed and interpreted this ECG.  Date: 12/17/2016  Rate: 129 Rhythm: sinus tachycardia QRS Axis: normal Intervals: normal ST/T Wave abnormalities: nonspecific changes   ____________________________________________  RADIOLOGY  chest x-ray pending ____________________________________________   PROCEDURES  Procedure(s) performed: No    Critical Care performed: No ____________________________________________   INITIAL IMPRESSION / ASSESSMENT AND PLAN / ED COURSE  Pertinent labs & imaging results that were available during my care of the patient were reviewed by me and considered in my medical decision making (see chart for details).  patient presents with nausea vomiting and diarrhea for  nearly one month with worsening jaundice and ascites consistent with worsening of liver function. No mental status changes. Significant tachycardia and productive cough.  We will give IV fluids, check labs, obtain x-ray and reevaluate  Bilirubin 10.8. CXR normal  D/w GI Dr. Allegra Lai, recommends ruq [place]  and inr and admission  D/w Dr. Madelon Lips for admission    ____________________________________________   FINAL CLINICAL IMPRESSION(S) / ED DIAGNOSES  Final diagnoses:  Cirrhosis (HCC)  Ascites due to alcoholic cirrhosis (HCC)  Jaundice      NEW MEDICATIONS STARTED DURING THIS VISIT:  New Prescriptions   No medications on file     Note:  This document was prepared using Dragon voice recognition software and may include unintentional dictation errors.    Jene Every, MD 12/17/16 1018

## 2016-12-17 NOTE — ED Triage Notes (Signed)
Patient presents to the ED with vomiting and diarrhea x 2 weeks.  Patient reports diarrhea has been black.  Patient's skin appears yellowish in color and abdomen is distended.  Patient reports cirrhosis of the liver but states the abdominal distention is new.  Patient is on home oxygen for COPD.  Patient states he has not seen his doctor in 6 months.  Patient reports he has not been eating for several days.

## 2016-12-17 NOTE — Progress Notes (Signed)
Family Meeting Note  Advance Directive:yes  Today a meeting took place with the Patient and spouse.  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  Additional follow-up to be provided: Gi consult and paracentesis.  Time spent during discussion:20 minutes  Corby Vandenberghe, Heath Gold, MD

## 2016-12-17 NOTE — ED Notes (Signed)
Patient transported to Ultrasound 

## 2016-12-17 NOTE — Progress Notes (Signed)
Patients wife at bedside requesting to speak to gastroenterologist. Allegra Lai called, message left by nurse secretary. Patient does not remember conversation with consulting MD. Wife is concerned regarding the plan of care, as she was not present when consult occurred. Bo Mcclintock, RN

## 2016-12-18 ENCOUNTER — Inpatient Hospital Stay: Payer: BLUE CROSS/BLUE SHIELD

## 2016-12-18 DIAGNOSIS — K7469 Other cirrhosis of liver: Secondary | ICD-10-CM

## 2016-12-18 DIAGNOSIS — E43 Unspecified severe protein-calorie malnutrition: Secondary | ICD-10-CM | POA: Insufficient documentation

## 2016-12-18 LAB — CBC
HEMATOCRIT: 32 % — AB (ref 40.0–52.0)
Hemoglobin: 11.2 g/dL — ABNORMAL LOW (ref 13.0–18.0)
MCH: 43.7 pg — ABNORMAL HIGH (ref 26.0–34.0)
MCHC: 35.1 g/dL (ref 32.0–36.0)
MCV: 124.5 fL — AB (ref 80.0–100.0)
Platelets: 139 10*3/uL — ABNORMAL LOW (ref 150–440)
RBC: 2.57 MIL/uL — ABNORMAL LOW (ref 4.40–5.90)
RDW: 15.1 % — AB (ref 11.5–14.5)
WBC: 7.6 10*3/uL (ref 3.8–10.6)

## 2016-12-18 LAB — COMPREHENSIVE METABOLIC PANEL
ALT: 42 U/L (ref 17–63)
AST: 148 U/L — AB (ref 15–41)
Albumin: 2 g/dL — ABNORMAL LOW (ref 3.5–5.0)
Alkaline Phosphatase: 228 U/L — ABNORMAL HIGH (ref 38–126)
Anion gap: 9 (ref 5–15)
BILIRUBIN TOTAL: 8.9 mg/dL — AB (ref 0.3–1.2)
BUN: 8 mg/dL (ref 6–20)
CALCIUM: 7.9 mg/dL — AB (ref 8.9–10.3)
CO2: 28 mmol/L (ref 22–32)
Chloride: 96 mmol/L — ABNORMAL LOW (ref 101–111)
Creatinine, Ser: 0.53 mg/dL — ABNORMAL LOW (ref 0.61–1.24)
GFR calc Af Amer: >60 mL/min (ref >60)
Glucose, Bld: 90 mg/dL (ref 65–99)
POTASSIUM: 3 mmol/L — AB (ref 3.5–5.1)
Sodium: 133 mmol/L — ABNORMAL LOW (ref 135–145)
TOTAL PROTEIN: 7 g/dL (ref 6.5–8.1)

## 2016-12-18 LAB — PHOSPHORUS: Phosphorus: 1.9 mg/dL — ABNORMAL LOW (ref 2.5–4.6)

## 2016-12-18 LAB — FOLATE RBC
Folate, Hemolysate: >620 ng/mL
HEMATOCRIT: 32.4 % — AB (ref 37.5–51.0)

## 2016-12-18 LAB — MAGNESIUM: Magnesium: 1.3 mg/dL — ABNORMAL LOW (ref 1.7–2.4)

## 2016-12-18 LAB — MISC LABCORP TEST (SEND OUT): Labcorp test code: 19588

## 2016-12-18 MED ORDER — IPRATROPIUM-ALBUTEROL 0.5-2.5 (3) MG/3ML IN SOLN
3.0000 mL | Freq: Three times a day (TID) | RESPIRATORY_TRACT | Status: DC
  Filled 2016-12-18 (×2): qty 3

## 2016-12-18 MED ORDER — POTASSIUM CHLORIDE CRYS ER 20 MEQ PO TBCR
40.0000 meq | EXTENDED_RELEASE_TABLET | Freq: Once | ORAL | Status: AC
  Administered 2016-12-18: 40 meq via ORAL
  Filled 2016-12-18: qty 2

## 2016-12-18 MED ORDER — SPIRONOLACTONE 50 MG PO TABS: 50.0000 mg | ORAL_TABLET | Freq: Every day | ORAL | 0 refills | Status: DC

## 2016-12-18 MED ORDER — SPIRONOLACTONE 25 MG PO TABS
50.0000 mg | ORAL_TABLET | Freq: Every day | ORAL | Status: DC
  Administered 2016-12-18: 09:00:00 50 mg via ORAL
  Filled 2016-12-18: qty 2

## 2016-12-18 MED ORDER — NICOTINE 21 MG/24HR TD PT24: 21.0000 mg | MEDICATED_PATCH | Freq: Every day | TRANSDERMAL | 0 refills | Status: DC

## 2016-12-18 MED ORDER — MAGNESIUM SULFATE 4 GM/100ML IV SOLN
4.0000 g | Freq: Once | INTRAVENOUS | Status: AC
  Administered 2016-12-18: 17:00:00 4 g via INTRAVENOUS
  Filled 2016-12-18: qty 100

## 2016-12-18 MED ORDER — FUROSEMIDE 20 MG PO TABS: 20.0000 mg | ORAL_TABLET | Freq: Every day | ORAL | 0 refills | Status: DC

## 2016-12-18 MED ORDER — POTASSIUM PHOSPHATES 15 MMOLE/5ML IV SOLN
20.0000 mmol | Freq: Once | INTRAVENOUS | Status: AC
  Administered 2016-12-18: 17:00:00 20 mmol via INTRAVENOUS
  Filled 2016-12-18: qty 6.67

## 2016-12-18 MED ORDER — ENSURE ENLIVE PO LIQD
237.0000 mL | Freq: Three times a day (TID) | ORAL | Status: DC
  Administered 2016-12-18: 15:00:00 237 mL via ORAL

## 2016-12-18 MED ORDER — LACTULOSE 10 GM/15ML PO SOLN
30.0000 g | Freq: Two times a day (BID) | ORAL | Status: DC
  Administered 2016-12-18: 09:00:00 30 g via ORAL
  Filled 2016-12-18: qty 60

## 2016-12-18 MED ORDER — LACTULOSE 10 GM/15ML PO SOLN: 30.0000 g | Freq: Two times a day (BID) | ORAL | 0 refills | Status: DC

## 2016-12-18 MED ORDER — FUROSEMIDE 20 MG PO TABS
20.0000 mg | ORAL_TABLET | Freq: Every day | ORAL | Status: DC
  Administered 2016-12-18: 20 mg via ORAL
  Filled 2016-12-18: qty 1

## 2016-12-18 MED ORDER — ENSURE ENLIVE PO LIQD: 237.0000 mL | Freq: Three times a day (TID) | ORAL | 12 refills | Status: DC

## 2016-12-18 NOTE — Progress Notes (Signed)
PT EVALUATION NOTE  Assessment: Pt admitted with above diagnosis. Pt currently with functional limitations due to the deficits listed below (see PT Problem List). Mr. Witczak demonstrates mild instability with transfers and while ambulating and was instructed to begin using quad cane at d/c.  Stair training completed with pt more steady following. Recommending HHPT at d/c.  Pt will benefit from skilled PT to increase their independence and safety with mobility to allow discharge to the venue listed below.      12/18/16 1123  PT Visit Information  Last PT Received On 12/18/16  Assistance Needed +1  History of Present Illness Pt is a 61 y/o M who presented with swelling in his abdomen, decreased appetite, and nausea.  Pt with h/o ETOH abuse and continues to drink alcohol. CIWA protocol. Pt admitted with alcoholic hepatitis with ascites.  Pt's PMH includes adjustment disorder with depressed mood, COPD, emphysema, neuropathy.  Precautions  Precautions Fall;Other (comment)  Precaution Comments Monitor pulse  Restrictions  Weight Bearing Restrictions No  Home Living  Family/patient expects to be discharged to: Private residence  Living Arrangements Spouse/significant other  Available Help at Discharge Family;Available PRN/intermittently (wife goes to work during the day)  Type of Home House  Home Access Stairs to enter  Entrance Stairs-Number of Steps 3  Entrance Stairs-Rails Right  Home Layout Two level;Bed/bath upstairs  Alternate Level Stairs-Number of Steps 15  Alternate Level Stairs-Rails Left  Bathroom Shower/Tub Walk-in Counselling psychologist  Home Equipment [place] - quad;Shower seat  Prior Function  Level of Independence Independent  Comments Pt independent with all ADLs PTA.  His wife does the driving.  He has had 2 falls in the past 6 months.    Communication  Communication No difficulties  Pain Assessment  Pain Assessment No/denies pain  Cognition   Arousal/Alertness Awake/alert  Behavior During Therapy WFL for tasks assessed/performed  Overall Cognitive Status Within Functional Limits for tasks assessed  Upper Extremity Assessment  Upper Extremity Assessment Overall WFL for tasks assessed  Lower Extremity Assessment  Lower Extremity Assessment (BLE strength grossly 4-/5)  Bed Mobility  Overal bed mobility Independent  General bed mobility comments No physical assist or cues needed. Pt performs independently.   Transfers  Overall transfer level Needs assistance  Equipment used None  Transfers Sit to/from Stand  Sit to Stand Supervision  General transfer comment Supervision for safety as pt demonstrates mild instability but no LOB.    Ambulation/Gait  Ambulation/Gait assistance Min guard  Ambulation Distance (Feet) 200 Feet  Assistive device None  Gait Pattern/deviations Staggering left;Staggering right;Narrow base of support  General Gait Details Pt ambulates with narrow base of support leading to him staggering L and R at times but no requiring physical assist to correct.  SpO2 remains at or above 94% on RA while ambulating.  When ambulating pt's pulse up to mid 130s.    Gait velocity interpretation at or above normal speed for age/gender  Stairs Yes  Stairs assistance Min guard  Stair Management One rail Left;One rail Right;Alternating pattern;Step to pattern;Forwards  Number of Stairs 12 (6, 6)  General stair comments Pt demonstrates to this PT how he ascends/descends at home with alternating pattern with use of L rail.  Pt was unsteady although did not require outside physical assist.  Instructed pt in and demonstrated to pt technique to perform step to pattern with use of R railing which the pt demonstrated back to this PT with improvement in his stability.  Balance  Overall balance assessment Needs assistance  Sitting-balance support No upper extremity supported;Feet supported  Sitting balance-Leahy Scale Good   Standing balance support No upper extremity supported;During functional activity  Standing balance-Leahy Scale Fair  Standing balance comment Pt likely would lose his balance with perturbation  General Comments  General comments (skin integrity, edema, etc.) Pt's pulse up to 151 when ambulating which improves with standing rest break.  SpO2 remains at or above 92% on RA throughout session.   PT - End of Session  Equipment Utilized During Treatment Gait belt  Activity Tolerance Patient tolerated treatment well  Patient left in bed;with call bell/phone within reach;with bed alarm set  Nurse Communication Mobility status;Other (comment) (SpO2)  PT Assessment  PT Recommendation/Assessment Patient needs continued PT services  PT Visit Diagnosis Unsteadiness on feet (R26.81);Other abnormalities of gait and mobility (R26.89);Muscle weakness (generalized) (M62.81)  PT Problem List Decreased strength;Decreased balance;Decreased knowledge of use of DME;Decreased safety awareness  PT Plan  PT Frequency (ACUTE ONLY) Min 2X/week  PT Treatment/Interventions (ACUTE ONLY) Gait training;DME instruction;Stair training;Functional mobility training;Therapeutic activities;Therapeutic exercise;Balance training;Neuromuscular re-education;Patient/family education  AM-PAC PT "6 Clicks" Daily Activity Outcome Measure  Difficulty turning over in bed (including adjusting bedclothes, sheets and blankets)? 4  Difficulty moving from lying on back to sitting on the side of the bed?  4  Difficulty sitting down on and standing up from a chair with arms (e.g., wheelchair, bedside commode, etc,.)? 3  Help needed moving to and from a bed to chair (including a wheelchair)? 3  Help needed walking in hospital room? 3  Help needed climbing 3-5 steps with a railing?  3  6 Click Score 20  Mobility G Code  CJ  PT Recommendation  Follow Up Recommendations Home health PT (pt has quad cane that he will begin using at d/c)  PT  equipment None recommended by PT  Individuals Consulted  Consulted and Agree with Results and Recommendations Patient  Acute Rehab PT Goals  Patient Stated Goal to go home  PT Goal Formulation With patient  Time For Goal Achievement 01/01/17  Potential to Achieve Goals Good  PT Time Calculation  PT Start Time (ACUTE ONLY) 1058  PT Stop Time (ACUTE ONLY) 1117  PT Time Calculation (min) (ACUTE ONLY) 19 min  PT G-Codes **NOT FOR INPATIENT CLASS**  Functional Assessment Tool Used AM-PAC 6 Clicks Basic Mobility;Clinical judgement  Functional Limitation Mobility: Walking and moving around  Mobility: Walking and Moving Around Current Status (Z6109) CJ  Mobility: Walking and Moving Around Goal Status (U0454) CI  PT General Charges  $$ ACUTE PT VISIT 1 Visit  PT Evaluation  $PT Eval Low Complexity 1 Low   Encarnacion Chu PT, DPT

## 2016-12-18 NOTE — Progress Notes (Signed)
Devin Darby, MD [address]  [place]  [place], [place] [address]  Main: [phone]  Fax: [phone] Pager: [phone]   Devin Bryant is being followed for alcohol cirrhosis, ascites, Day 1 of follow up    Subjective: Devin Bryant, did not hit head CT head negative Devin Bryant Devin Bryant Says Devin Bryant is starting to have withdrawal symptoms, tremors Devin his hands   Objective: Vital signs Devin last 24 hours: Vitals:   12/17/16 2034 12/18/16 0420 12/18/16 0547 12/18/16 1014  BP:  121/77 128/85 109/68  Pulse:  (!) 118 (!) 131 (!) 125  Resp:  15 14   Temp:  98.3 F (36.8 C) 97.8 F (36.6 C) 98.4 F (36.9 C)  TempSrc:  Oral Oral Oral  SpO2: 95% 98% 97% 90%  Weight:      Height:       Weight change:  No intake or output data Devin the 24 hours ending 12/18/16 1251   Exam: Heart:: Regular rate and rhythm or S1S2 present Lungs: clear to auscultation Abdomen: soft, nontender, distended, tympanic, normal bowel sounds   Lab Results: Hepatic Function Latest Ref Rng & Units 12/18/2016 12/17/2016 08/18/2016  Total Protein 6.5 - 8.1 g/dL 7.0 8.0 6.3(L)  Albumin 3.5 - 5.0 g/dL 2.0(L) 2.4(L) 3.2(L)  AST 15 - 41 U/L 148(H) 195(H) 197(H)  ALT 17 - 63 U/L 42 55 112(H)  Alk Phosphatase 38 - 126 U/L 228(H) 283(H) 96  Total Bilirubin 0.3 - 1.2 mg/dL 8.9(H) 10.8(H) 2.2(H)  Bilirubin, Direct 0.1 - 0.5 mg/dL - 5.7(H) -   CBC Latest Ref Rng & Units 12/18/2016 12/17/2016 08/20/2016  WBC 3.8 - 10.6 K/uL 7.6 9.8 6.5  Hemoglobin 13.0 - 18.0 g/dL 11.2(L) 12.9(L) 12.9(L)  Hematocrit 40.0 - 52.0 % 32.0(L) 37.0(L) 37.9(L)  Platelets 150 - 440 K/uL 139(L) 152 127(L)   BMP Latest Ref Rng & Units 12/18/2016 12/17/2016 08/20/2016  Glucose 65 - 99 mg/dL 90 99 81  BUN 6 - 20 mg/dL 8 9 <5(L)  Creatinine 0.61 - 1.24 mg/dL 0.53(L) 0.63 0.59(L)  BUN/Creat Ratio 10 - 24 - - -  Sodium 135 - 145 mmol/L 133(L) 133(L) 137  Potassium 3.5 - 5.1 mmol/L 3.0(L) 3.0(L)  2.7(LL)  Chloride 101 - 111 mmol/L 96(L) 94(L) 97(L)  CO2 22 - 32 mmol/L 28 28 30   Calcium 8.9 - 10.3 mg/dL 7.9(L) 8.5(L) 8.5(L)   Micro Results: Recent Results (from the past 240 hour(s))  Body fluid culture     Status: None (Preliminary result)   Collection Time: 12/17/16 12:25 PM  Result Value Ref Range Status   Specimen Description PERITONEAL  Final   Special Requests NONE  Final   Gram Stain   Final    RARE WBC PRESENT, PREDOMINANTLY MONONUCLEAR NO ORGANISMS SEEN    Culture   Final    NO GROWTH < 24 HOURS Performed at [facility], [place] [address] [place], [place] [address]    Report Status PENDING  Incomplete   Studies/Results: Dg Chest 2 View  Result Date: 12/17/2016 CLINICAL DATA:  Vomiting and diarrhea. EXAM: CHEST  2 VIEW COMPARISON:  Chest x-ray dated August 18, 2016. FINDINGS: The cardiomediastinal silhouette is normal Devin size. Normal pulmonary vascularity. Trace left pleural effusion. No focal consolidation or pneumothorax. No acute osseous abnormality. IMPRESSION: Trace left pleural effusion. Electronically Signed   By: Titus Dubin M.D.   On: 12/17/2016 09:49   Ct Head Wo Contrast  Result Date:  12/18/2016 CLINICAL DATA:  61 year old male status post fall Devin Bryant, struck head on pipe under sink. EXAM: CT HEAD WITHOUT CONTRAST TECHNIQUE: Contiguous axial images were obtained from the base of the skull through the vertex without intravenous contrast. COMPARISON:  None. FINDINGS: Brain: No midline shift, ventriculomegaly, mass effect, evidence of mass lesion, intracranial hemorrhage or evidence of cortically based acute infarction. Gray-white matter differentiation is within normal limits for age throughout the brain. No encephalomalacia identified. Vascular: Calcified atherosclerosis at the skull base. No suspicious intracranial vascular hyperdensity. Skull: Intact.  No acute osseous abnormality identified. Sinuses/Orbits: Clear. Other: No scalp hematoma  identified. Visualized orbit soft tissues are within normal limits. IMPRESSION: 1.  No acute traumatic injury identified. 2.  Normal for age non contrast CT appearance of the brain. Electronically Signed   By: Genevie Ann M.D.   On: 12/18/2016 07:31   US Paracentesis  Result Date: 12/17/2016 INDICATION: Hepatic cirrhosis and ascites EXAM: ULTRASOUND GUIDED  PARACENTESIS MEDICATIONS: None. COMPLICATIONS: None immediate. PROCEDURE: Informed written consent was obtained from the patient after a discussion of the risks, benefits and alternatives to treatment. A timeout was performed prior to the initiation of the procedure. Initial ultrasound scanning demonstrates a large amount of ascites within the right lower abdominal quadrant. The right lower abdomen was prepped and draped Devin the usual sterile fashion. 1% lidocaine was used for local anesthesia. Following this, a 6 Fr Safe-T-Centesis catheter was introduced. An ultrasound image was saved for documentation purposes. The paracentesis was performed. Initially 1 L of fluid was withdrawn without difficulty. Drainage then became somewhat difficult due to mobile omentum along the margin of the liver. The catheter was attempted to be repositioned which was unsuccessful. It was subsequently removed and a new 6 [place] Safe-T-Centesis catheter placed along the margin of the liver. An additional 1.6 L was then removed. The catheter was removed and a dressing was applied. The patient tolerated the procedure well without immediate post procedural complication. FINDINGS: A total of approximately 2.6 L of bilirubin stained fluid was removed. Samples were sent to the laboratory as requested by the clinical team. IMPRESSION: Successful ultrasound-guided paracentesis yielding 2.6 liters of peritoneal fluid. A small amount of residual fluid is noted along the liver although the majority of the fluid from the more inferior abdomen has resolved. Mobile omentum was limiting drainage to  completion. Electronically Signed   By: Inez Catalina M.D.   On: 12/17/2016 13:08   Dg Hip Unilat With Pelvis 2-3 Views Left  Result Date: 12/18/2016 CLINICAL DATA:  Fall EXAM: DG HIP (WITH OR WITHOUT PELVIS) 2-3V LEFT COMPARISON:  None. FINDINGS: Hip joints and SI joints are symmetric and unremarkable. No acute bony abnormality. Specifically, no fracture, subluxation, or dislocation. Soft tissues are intact. IMPRESSION: No acute bony abnormality. Electronically Signed   By: Rolm Baptise M.D.   On: 12/18/2016 08:23   Dg Hip Unilat With Pelvis 2-3 Views Right  Result Date: 12/18/2016 CLINICAL DATA:  Fall. EXAM: DG HIP (WITH OR WITHOUT PELVIS) 2-3V RIGHT COMPARISON:  None. FINDINGS: Hip joints and SI joints are symmetric and unremarkable. No acute bony abnormality. Specifically, no fracture, subluxation, or dislocation. Soft tissues are intact. IMPRESSION: No acute bony abnormality. Electronically Signed   By: Rolm Baptise M.D.   On: 12/18/2016 08:23   US Abdomen Limited Ruq  Result Date: 12/17/2016 CLINICAL DATA:  Cirrhosis.  Evaluate portal vein patency EXAM: ULTRASOUND ABDOMEN LIMITED RIGHT UPPER QUADRANT COMPARISON:  Ultrasound 08/18/2016 FINDINGS: Gallbladder: Gallbladder sludge without  gallstones. Gallbladder wall 2 mm. Negative sonographic Murphy sign Common bile duct: Diameter: 6 mm Liver: Increased echogenicity of the liver diffusely compatible with chronic liver disease and cirrhosis. Moderate amount of ascites. Portal vein is patent on color Doppler imaging with normal direction of blood flow towards the liver. IMPRESSION: Gallbladder sludge without stone Cirrhotic liver with ascites. Portal vein patent with normal flow direction. Electronically Signed   By: Franchot Gallo M.D.   On: 12/17/2016 10:52   Medications: I have reviewed the patient's current medications. Scheduled Meds: . escitalopram  10 mg Oral Daily  . feeding supplement (ENSURE ENLIVE)  237 mL Oral TID BM  . fluticasone  1  spray Each Nare Daily  . folic acid  1 mg Oral Daily  . furosemide  20 mg Oral Daily  . heparin  5,000 Units Subcutaneous Q8H  . ipratropium-albuterol  3 mL Nebulization TID  . lactulose  30 g Oral BID  . mirtazapine  30 mg Oral QHS  . mometasone-formoterol  2 puff Inhalation BID  . multivitamin with minerals  1 tablet Oral Daily  . nicotine  21 mg Transdermal Daily  . nystatin  5 mL Oral BID  . pantoprazole  40 mg Oral Daily  . pregabalin  75 mg Oral BID  . spironolactone  50 mg Oral Daily  . theophylline  300 mg Oral BID  . thiamine  100 mg Oral Daily   Or  . thiamine  100 mg Intravenous Daily  . tiotropium  18 mcg Inhalation Daily   Continuous Infusions: PRN Meds:.albuterol, docusate sodium, LORazepam **OR** LORazepam, ondansetron (ZOFRAN) IV, traMADol   Assessment: Principal Problem:   Alcoholic hepatitis with ascites  Devin Bryant is a 61 y.o. male with heavy ETOH use, COPD, prior h/o alc hep, admitted with decompensated cirrhosis, ascites and 3 weeks of non-bloody diarrhea Devin the setting of ongoing ETOH use   Plan:  Elevated LFTs: Most likely secondary to acute alcoholic hepatitis - LFTs are improving - Devin Bryant does not have acute liver failure - Secondary liver disease work up Devin progress   Cirrhosis: Most likely ETOH related. Given that Devin Bryant responded very well to prednisone Devin the past, would like to evaluate for auto-immune etiology as well Devin Bryant, Devin Bryant, Devin Bryant, Devin Bryant Ascitic fluid negative for SBP Recommend lasix 14m and aldactone 578mas out pt given new onset of ascites Recommend low sodium diet EGD for variceal screening as out pt [place] with no liver lesions, Devin AFP, Devin Bryant  Acute onset of diarrhea: likely infectious, viral or bacterial gastroenteritis - Devin stool studies including C Diff and GI pathogen panel  ETOH dependance:  - Watch for withdrawal - On CIWA protocol - may start low dose librium with close hepatology f/u since  librium is metabolized by liver, use with caution Devin cirrhotic patients - [facility]   LOS: 1 day   Devin Bryant 12/18/2016, 12:51 PM

## 2016-12-18 NOTE — Progress Notes (Signed)
Initial Nutrition Assessment  DOCUMENTATION CODES:   Severe malnutrition in context of chronic illness  INTERVENTION:   Pt at high refeeding risk; recommend monitor P, K, and Mg for 3 days and supplement as needed per MD discretion.   Ensure Enlive po TID, each supplement provides 350 kcal and 20 grams of protein  Magic cup TID with meals, each supplement provides 290 kcal and 9 grams of protein  MVI  NUTRITION DIAGNOSIS:   Malnutrition (severe) related to chronic illness (COPD, Cirrhosis, etoh abuse) as evidenced by severe depletion of muscle mass, severe depletion of body fat, energy intake < or equal to 75% for > or equal to 1 month, 6 percent weight loss in 3 weeks.  GOAL:   Patient will meet greater than or equal to 90% of their needs  MONITOR:   PO intake, Supplement acceptance, Labs, Weight trends, Skin, I & O's  REASON FOR ASSESSMENT:   Consult Diet education  ASSESSMENT:   61 year old male with a history of EtOH abuse, liver cirrhosis and psoriasis who presented with abdominal distention, nausia and vomting x 2 weeks   Met with pt in room today. Pt reports poor appetite and oral intake for 3 weeks pta r/t abdominal distension and feelings of early satiety. Pt reports that he ate 75% of his breakfast this morning and that this was more than he has eaten in weeks. Pt reports that his UBW is 185lbs; he reports that he has lost 10lbs(6%) in 3 weeks. This is significant weight loss; however, it is difficult to determine pt's true wt r/t severe ascites. Pt s/p paracentesis today; 2.6L removed. Pt does drink Ensure; RD will order. RD discussed importance of adequate protein intake with pt and provided education regarding a low sodium diet. Pt with low K, P, and Mg today; recommend monitor for 3 days. Pt at high refeeding risk. Spoke to Pharmacy today; plan to supplement as needed.   Medications reviewed and include: folic acid, lasix, heparin, lactulose, remeron, nicotine,  protonix, aldactone, thiamine   Labs reviewed: Na 133(L), K 3.0(L), Cl 96(L), creat 0.53(L), Ca 7.9(L) adj. 9.5 wnl, P 1.9(L), Mg 1.3(L), AlkPhos 228(H), alb 2.0(L), AST 148(H), tbili 8.9(H) Ferritin 1211(H)- 10/15 B-12 1217(H)- 10/15  Nutrition-Focused physical exam completed. Findings are severe fat depletions in upper arms, severe muscle depletions in lower legs, moderate muscle depletions in hands, and no edema. Pt noted to have ascites and icterus.   Diet Order:  DIET SOFT Room service appropriate? Yes; Fluid consistency: Thin  Skin:  Wound (see comment) (Right great toe is black on the tip)  Last BM:  10/14  Height:   Ht Readings from Last 1 Encounters:  12/17/16 5' 10"  (1.778 m)    Weight:   Wt Readings from Last 1 Encounters:  12/17/16 165 lb (74.8 kg)    Ideal Body Weight:  75.4 kg  BMI:  Body mass index is 23.68 kg/m.  Estimated Nutritional Needs:   Kcal:  1850-2150kcal/day   Protein:  113-128g/day   Fluid:  per MD  EDUCATION NEEDS:   Education needs addressed  Koleen Distance MS, RD, LDN Pager #[phone] After Hours Pager: [phone]

## 2016-12-18 NOTE — Care Management Note (Signed)
Case Management Note  Patient Details  Name: Devin Bryant MRN: [id number] Date of Birth: 1956-02-17  Subjective/Objective:     Admitted to [facility] with the diagnosis of alcoholic hepatitis. Lives with wife, Devin Bryant [phone]). Last seen Devin Bryant at [facility] 08/27/16. Prescriptions are filled at [facility] in [place]. No home Health. No skilled nursing, Home oxygen per  Devin Bryant x 2 years. Uses 2 liters per nasal cannula prn. Nebulizer, cane, and shower chair in the home. Takes care of all basic activities of daily living himself, can drive, if needed. Will not be driving until more stable. Fell x 2. Decreased appetite. Loss of 25 pounds since June.  Discharge to home today per Dr. Juliene Pina.                Action/Plan: Physical therapy evaluation completed. Recommending home with home health and physical therapy. Requested co-pay due to having [employer] as insurance.  Devin Bryant, [facility] representative checking on co-pay   Expected Discharge Date:  12/18/16               Expected Discharge Plan:     In-House Referral:     Discharge planning Services     Post Acute Care Choice:    Choice offered to:   Mr & Ms Ludwig Lean   DME Arranged:    DME Agency:     [facility] Arranged:   yes  HH Agency:   [facility] for PT only  Status of Service:     If discussed at Long Length of Stay Meetings, dates discussed:    Additional Comments:  Gwenette Greet, RN MSN CCM Care Management [phone] 12/18/2016, 3:55 PM

## 2016-12-18 NOTE — Discharge Summary (Signed)
[facility]   PATIENT NAME: Devin Bryant    MR#:  [id number]  DATE OF BIRTH:  [birth year]  DATE OF ADMISSION:  12/17/2016 ADMITTING PHYSICIAN: Altamese Dilling, MD  DATE OF DISCHARGE: 05/18/2016  PRIMARY CARE PHYSICIAN: Dorcas Carrow, DO    ADMISSION DIAGNOSIS:  Cirrhosis (HCC) [K74.60] Jaundice [R17] Ascites [R18.8] Other cirrhosis of liver (HCC) [K74.69] Ascites due to alcoholic cirrhosis (HCC) [K70.31]  DISCHARGE DIAGNOSIS:  Principal Problem:   Alcoholic hepatitis with ascites Active Problems:   Protein-calorie malnutrition, severe   SECONDARY DIAGNOSIS:   Past Medical History:  Diagnosis Date  . Adjustment disorder with depressed mood   . Allergy   . Anxiety   . COPD (chronic obstructive pulmonary disease) (HCC)   . ED (erectile dysfunction)   . Elevated glucose   . Elevated serum glutamic pyruvic transaminase (SGPT) level   . Emphysema lung (HCC)   . GERD (gastroesophageal reflux disease)   . Hypertension   . Hypertriglyceridemia   . Liver disease   . Neuropathy   . Tobacco use     HOSPITAL COURSE:   61 year old male with a history of EtOH abuse, liver cirrhosis and psoriasis who presented with abdominal distention.  1. EtOH related cirrhosis of the liver status post paracentesis Continue Lasix and Aldactone and lactulose Needs outpatient followup with GI for EGD There was no evidence of SBP by paracentesis. Dietary wasconsulted for low sodium diet Ultrasound of the liver showed no liver lesions   2. EtOH abuse: He did not want to go through detox in the hospital  3. EtOH hepatitis: LFTs have improved  4. Depression: Continue Lexapro and Remeron  5. Hypokalemia/Mg/Phos: Repleted prior to discharge  6. Chronic hypoxic respiratory failure due toCOPD without signs of exacerbation: Continue inhalers and oxygen Continue theophylline  7. Fall: Home  health will be arranged at discharge 8.  Tobacco dependence: Patient is encouraged to quit smoking. Counseling was provided for 4 minutes.     DISCHARGE CONDITIONS AND DIET:   stable regular diet  CONSULTS OBTAINED:  Treatment Team:  Toney Reil, MD  DRUG ALLERGIES:  No Known Allergies  DISCHARGE MEDICATIONS:   Current Discharge Medication List    START taking these medications   Details  feeding supplement, ENSURE ENLIVE, (ENSURE ENLIVE) LIQD Take 237 mLs by mouth 3 (three) times daily between meals. Qty: 237 mL, Refills: 12    furosemide (LASIX) 20 MG tablet Take 1 tablet (20 mg total) by mouth daily. Qty: 30 tablet, Refills: 0    lactulose (CHRONULAC) 10 GM/15ML solution Take 45 mLs (30 g total) by mouth 2 (two) times daily. Qty: 240 mL, Refills: 0    nicotine (NICODERM CQ - DOSED IN MG/24 HOURS) 21 mg/24hr patch Place 1 patch (21 mg total) onto the skin daily. Qty: 28 patch, Refills: 0      CONTINUE these medications which have CHANGED   Details  spironolactone (ALDACTONE) 50 MG tablet Take 1 tablet (50 mg total) by mouth daily. Qty: 30 tablet, Refills: 0      CONTINUE these medications which have NOT CHANGED   Details  ADVAIR HFA 115-21 MCG/ACT inhaler Inhale 2 puffs into the lungs 2 (two) times daily.     Cetirizine HCl (ZYRTEC ALLERGY) 10 MG CAPS Take 10 mg by mouth daily.    escitalopram (LEXAPRO) 10 MG tablet Take 1 tablet (10 mg total) by mouth daily. Qty: 90 tablet, Refills: 1    esomeprazole (NEXIUM) 20  MG capsule Take 20 mg by mouth daily at 12 noon.    fluticasone (FLONASE) 50 MCG/ACT nasal spray Place 1 spray into both nostrils daily.     loperamide (IMODIUM) 2 MG capsule Take 1 capsule (2 mg total) by mouth every 6 (six) hours as needed for diarrhea or loose stools. Qty: 30 capsule, Refills: 0    mirtazapine (REMERON) 30 MG tablet Take 1 tablet (30 mg total) by mouth at bedtime. Qty: 90 tablet, Refills: 1    Multiple Vitamin (MULTIVITAMIN) tablet Take 1 tablet by mouth  daily.    pregabalin (LYRICA) 75 MG capsule Take 1 capsule (75 mg total) by mouth 2 (two) times daily. Qty: 180 capsule, Refills: 4    theophylline (THEODUR) 300 MG 12 hr tablet Take 300 mg by mouth 2 (two) times daily. Refills: 11    tiotropium (SPIRIVA HANDIHALER) 18 MCG inhalation capsule Place 1 capsule (18 mcg total) into inhaler and inhale daily. Qty: 30 capsule, Refills: 0    traMADol (ULTRAM) 50 MG tablet Take 1 tablet (50 mg total) by mouth 2 (two) times daily as needed. Qty: 56 tablet, Refills: 0    albuterol (PROVENTIL HFA;VENTOLIN HFA) 108 (90 Base) MCG/ACT inhaler Inhale 2 puffs into the lungs every 6 (six) hours as needed for wheezing or shortness of breath. Qty: 1 Inhaler, Refills: 12    albuterol (PROVENTIL) (2.5 MG/3ML) 0.083% nebulizer solution Take 3 mLs (2.5 mg total) by nebulization every 4 (four) hours as needed for wheezing or shortness of breath. Qty: 50 mL, Refills: 3    nystatin (MYCOSTATIN) 100000 UNIT/ML suspension Take 5 mLs by mouth 2 (two) times daily. Uses with his inhalers Refills: 13    ondansetron (ZOFRAN ODT) 4 MG disintegrating tablet Take 1 tablet (4 mg total) by mouth every 8 (eight) hours as needed for nausea or vomiting. Qty: 20 tablet, Refills: 1      STOP taking these medications     Diphenhyd-Hydrocort-Nystatin (FIRST-DUKES MOUTHWASH) SUSP      lidocaine (XYLOCAINE) 5 % ointment           Today   CHIEF COMPLAINT:  Patient wants to go home does not want to detox in hospital   VITAL SIGNS:  Blood pressure 109/68, pulse (!) 125, temperature 98.4 F (36.9 C), temperature source Oral, resp. rate 14, height  (1.778 m), weight 74.8 kg (165 lb), SpO2 90 %.   REVIEW OF SYSTEMS:  Review of Systems  Constitutional: Negative.  Negative for chills, fever and malaise/fatigue.  HENT: Negative.  Negative for ear discharge, ear pain, hearing loss, nosebleeds and sore throat.   Eyes: Negative.  Negative for blurred vision and pain.   Respiratory: Negative.  Negative for cough, hemoptysis, shortness of breath and wheezing.   Cardiovascular: Negative.  Negative for chest pain, palpitations and leg swelling.  Gastrointestinal: Negative.  Negative for abdominal pain, blood in stool, diarrhea, nausea and vomiting.  Genitourinary: Negative.  Negative for dysuria.  Musculoskeletal: Negative.  Negative for back pain.  Skin: Negative.   Neurological: Negative for dizziness, tremors, speech change, focal weakness, seizures and headaches.  Endo/Heme/Allergies: Negative.  Does not bruise/bleed easily.  Psychiatric/Behavioral: Positive for substance abuse. Negative for depression, hallucinations and suicidal ideas.     PHYSICAL EXAMINATION:  GENERAL:  61 y.o.-year-old patient lying in the bed with no acute distress.  NECK:  Supple, no jugular venous distention. No thyroid enlargement, no tenderness.  LUNGS: Normal breath sounds bilaterally, no wheezing, rales,rhonchi  No use of accessory  muscles of respiration.  CARDIOVASCULAR: S1, S2 normal. No murmurs, rubs, or gallops.  ABDOMEN: Soft, non-tender, non-distended. Bowel sounds present. No organomegaly or mass.  EXTREMITIES: No pedal edema, cyanosis, or clubbing.  PSYCHIATRIC: The patient is alert and oriented x 3.  SKIN: No obvious rash, lesion, or ulcer.   DATA REVIEW:   CBC  Recent Labs Lab 12/18/16 0611  WBC 7.6  HGB 11.2*  HCT 32.0*  PLT 139*    Chemistries   Recent Labs Lab 12/18/16 0611 12/18/16 0908  NA 133*  --   K 3.0*  --   CL 96*  --   CO2 28  --   GLUCOSE 90  --   BUN 8  --   CREATININE 0.53*  --   CALCIUM 7.9*  --   MG  --  1.3*  AST 148*  --   ALT 42  --   ALKPHOS 228*  --   BILITOT 8.9*  --     Cardiac Enzymes  Recent Labs Lab 12/17/16 0826  TROPONINI 0.06*    Microbiology Results  @  RADIOLOGY:  Dg Chest 2 View  Result Date: 12/17/2016 CLINICAL DATA:  Vomiting and diarrhea. EXAM: CHEST  2 VIEW COMPARISON:   Chest x-ray dated August 18, 2016. FINDINGS: The cardiomediastinal silhouette is normal in size. Normal pulmonary vascularity. Trace left pleural effusion. No focal consolidation or pneumothorax. No acute osseous abnormality. IMPRESSION: Trace left pleural effusion. Electronically Signed   By: Obie Dredge M.D.   On: 12/17/2016 09:49   Ct Head Wo Contrast  Result Date: 12/18/2016 CLINICAL DATA:  61 year old male status post fall in bathroom, struck head on pipe under sink. EXAM: CT HEAD WITHOUT CONTRAST TECHNIQUE: Contiguous axial images were obtained from the base of the skull through the vertex without intravenous contrast. COMPARISON:  None. FINDINGS: Brain: No midline shift, ventriculomegaly, mass effect, evidence of mass lesion, intracranial hemorrhage or evidence of cortically based acute infarction. Gray-white matter differentiation is within normal limits for age throughout the brain. No encephalomalacia identified. Vascular: Calcified atherosclerosis at the skull base. No suspicious intracranial vascular hyperdensity. Skull: Intact.  No acute osseous abnormality identified. Sinuses/Orbits: Clear. Other: No scalp hematoma identified. Visualized orbit soft tissues are within normal limits. IMPRESSION: 1.  No acute traumatic injury identified. 2.  Normal for age non contrast CT appearance of the brain. Electronically Signed   By: Odessa Fleming M.D.   On: 12/18/2016 07:31   US Paracentesis  Result Date: 12/17/2016 INDICATION: Hepatic cirrhosis and ascites EXAM: ULTRASOUND GUIDED  PARACENTESIS MEDICATIONS: None. COMPLICATIONS: None immediate. PROCEDURE: Informed written consent was obtained from the patient after a discussion of the risks, benefits and alternatives to treatment. A timeout was performed prior to the initiation of the procedure. Initial ultrasound scanning demonstrates a large amount of ascites within the right lower abdominal quadrant. The right lower abdomen was prepped and draped in the  usual sterile fashion. 1% lidocaine was used for local anesthesia. Following this, a 6 Fr Safe-T-Centesis catheter was introduced. An ultrasound image was saved for documentation purposes. The paracentesis was performed. Initially 1 L of fluid was withdrawn without difficulty. Drainage then became somewhat difficult due to mobile omentum along the margin of the liver. The catheter was attempted to be repositioned which was unsuccessful. It was subsequently removed and a new 6 [place] Safe-T-Centesis catheter placed along the margin of the liver. An additional 1.6 L was then removed. The catheter was removed and a dressing was applied. The patient  tolerated the procedure well without immediate post procedural complication. FINDINGS: A total of approximately 2.6 L of bilirubin stained fluid was removed. Samples were sent to the laboratory as requested by the clinical team. IMPRESSION: Successful ultrasound-guided paracentesis yielding 2.6 liters of peritoneal fluid. A small amount of residual fluid is noted along the liver although the majority of the fluid from the more inferior abdomen has resolved. Mobile omentum was limiting drainage to completion. Electronically Signed   By: Alcide Clever M.D.   On: 12/17/2016 13:08   Dg Hip Unilat With Pelvis 2-3 Views Left  Result Date: 12/18/2016 CLINICAL DATA:  Fall EXAM: DG HIP (WITH OR WITHOUT PELVIS) 2-3V LEFT COMPARISON:  None. FINDINGS: Hip joints and SI joints are symmetric and unremarkable. No acute bony abnormality. Specifically, no fracture, subluxation, or dislocation. Soft tissues are intact. IMPRESSION: No acute bony abnormality. Electronically Signed   By: Charlett Nose M.D.   On: 12/18/2016 08:23   Dg Hip Unilat With Pelvis 2-3 Views Right  Result Date: 12/18/2016 CLINICAL DATA:  Fall. EXAM: DG HIP (WITH OR WITHOUT PELVIS) 2-3V RIGHT COMPARISON:  None. FINDINGS: Hip joints and SI joints are symmetric and unremarkable. No acute bony abnormality.  Specifically, no fracture, subluxation, or dislocation. Soft tissues are intact. IMPRESSION: No acute bony abnormality. Electronically Signed   By: Charlett Nose M.D.   On: 12/18/2016 08:23   US Abdomen Limited Ruq  Result Date: 12/17/2016 CLINICAL DATA:  Cirrhosis.  Evaluate portal vein patency EXAM: ULTRASOUND ABDOMEN LIMITED RIGHT UPPER QUADRANT COMPARISON:  Ultrasound 08/18/2016 FINDINGS: Gallbladder: Gallbladder sludge without gallstones. Gallbladder wall 2 mm. Negative sonographic Murphy sign Common bile duct: Diameter: 6 mm Liver: Increased echogenicity of the liver diffusely compatible with chronic liver disease and cirrhosis. Moderate amount of ascites. Portal vein is patent on color Doppler imaging with normal direction of blood flow towards the liver. IMPRESSION: Gallbladder sludge without stone Cirrhotic liver with ascites. Portal vein patent with normal flow direction. Electronically Signed   By: Marlan Palau M.D.   On: 12/17/2016 10:52      Current Discharge Medication List    START taking these medications   Details  feeding supplement, ENSURE ENLIVE, (ENSURE ENLIVE) LIQD Take 237 mLs by mouth 3 (three) times daily between meals. Qty: 237 mL, Refills: 12    furosemide (LASIX) 20 MG tablet Take 1 tablet (20 mg total) by mouth daily. Qty: 30 tablet, Refills: 0    lactulose (CHRONULAC) 10 GM/15ML solution Take 45 mLs (30 g total) by mouth 2 (two) times daily. Qty: 240 mL, Refills: 0    nicotine (NICODERM CQ - DOSED IN MG/24 HOURS) 21 mg/24hr patch Place 1 patch (21 mg total) onto the skin daily. Qty: 28 patch, Refills: 0      CONTINUE these medications which have CHANGED   Details  spironolactone (ALDACTONE) 50 MG tablet Take 1 tablet (50 mg total) by mouth daily. Qty: 30 tablet, Refills: 0      CONTINUE these medications which have NOT CHANGED   Details  ADVAIR HFA 115-21 MCG/ACT inhaler Inhale 2 puffs into the lungs 2 (two) times daily.     Cetirizine HCl (ZYRTEC  ALLERGY) 10 MG CAPS Take 10 mg by mouth daily.    escitalopram (LEXAPRO) 10 MG tablet Take 1 tablet (10 mg total) by mouth daily. Qty: 90 tablet, Refills: 1    esomeprazole (NEXIUM) 20 MG capsule Take 20 mg by mouth daily at 12 noon.    fluticasone (FLONASE) 50 MCG/ACT nasal  spray Place 1 spray into both nostrils daily.     loperamide (IMODIUM) 2 MG capsule Take 1 capsule (2 mg total) by mouth every 6 (six) hours as needed for diarrhea or loose stools. Qty: 30 capsule, Refills: 0    mirtazapine (REMERON) 30 MG tablet Take 1 tablet (30 mg total) by mouth at bedtime. Qty: 90 tablet, Refills: 1    Multiple Vitamin (MULTIVITAMIN) tablet Take 1 tablet by mouth daily.    pregabalin (LYRICA) 75 MG capsule Take 1 capsule (75 mg total) by mouth 2 (two) times daily. Qty: 180 capsule, Refills: 4    theophylline (THEODUR) 300 MG 12 hr tablet Take 300 mg by mouth 2 (two) times daily. Refills: 11    tiotropium (SPIRIVA HANDIHALER) 18 MCG inhalation capsule Place 1 capsule (18 mcg total) into inhaler and inhale daily. Qty: 30 capsule, Refills: 0    traMADol (ULTRAM) 50 MG tablet Take 1 tablet (50 mg total) by mouth 2 (two) times daily as needed. Qty: 56 tablet, Refills: 0    albuterol (PROVENTIL HFA;VENTOLIN HFA) 108 (90 Base) MCG/ACT inhaler Inhale 2 puffs into the lungs every 6 (six) hours as needed for wheezing or shortness of breath. Qty: 1 Inhaler, Refills: 12    albuterol (PROVENTIL) (2.5 MG/3ML) 0.083% nebulizer solution Take 3 mLs (2.5 mg total) by nebulization every 4 (four) hours as needed for wheezing or shortness of breath. Qty: 50 mL, Refills: 3    nystatin (MYCOSTATIN) 100000 UNIT/ML suspension Take 5 mLs by mouth 2 (two) times daily. Uses with his inhalers Refills: 13    ondansetron (ZOFRAN ODT) 4 MG disintegrating tablet Take 1 tablet (4 mg total) by mouth every 8 (eight) hours as needed for nausea or vomiting. Qty: 20 tablet, Refills: 1      STOP taking these medications      Diphenhyd-Hydrocort-Nystatin (FIRST-DUKES MOUTHWASH) SUSP      lidocaine (XYLOCAINE) 5 % ointment             Management plans discussed with the patient and he is in agreement. Stable for discharge home  Patient should follow up with pcp  CODE STATUS:     Code Status Orders        Start     Ordered   12/17/16 1332  Full code  Continuous     12/17/16 1331    Code Status History    Date Active Date Inactive Code Status Order ID Comments User Context   08/18/2016  9:27 PM 08/20/2016  3:13 PM Full Code [id number]  Ramonita Lab, MD ED   08/18/2016  2:00 PM 08/18/2016  9:27 PM Full Code [id number]  Willy Eddy, MD ED   02/03/2016  2:01 PM 02/04/2016  2:52 PM Full Code [id number]  Enedina Finner, MD Inpatient   01/15/2016  8:29 PM 01/17/2016  3:32 PM Full Code [id number]  Katha Hamming, MD ED   10/05/2015  3:13 PM 10/08/2015  4:53 PM Full Code [id number]  Alford Highland, MD ED   06/27/2015  2:36 PM 06/29/2015  2:34 PM Full Code [id number]  Katha Hamming, MD ED   03/11/2015  5:29 PM 03/14/2015  3:59 PM Full Code [id number]  Dorothea Ogle, MD Inpatient   10/17/2014  2:31 PM 10/19/2014  2:34 PM Full Code [id number]  Shaune Pollack, MD Inpatient    Advance Directive Documentation     Most Recent Value  Type of Advance Directive  Living will  Pre-existing out of facility DNR order (yellow form or pink MOST  form)  -  "MOST" Form in Place?  -      TOTAL TIME TAKING CARE OF THIS PATIENT: 37 minutes.    Note: This dictation was prepared with Dragon dictation along with smaller phrase technology. Any transcriptional errors that result from this process are unintentional.  Jozalyn Baglio M.D on 12/18/2016 at 3:17 PM  Between 7am to 6pm - Pager - [phone] After 6pm go to [email or web address] - Social research officer, government  [facility]  Office  [phone]  CC: Primary care physician; Dorcas Carrow, DO

## 2016-12-18 NOTE — Progress Notes (Addendum)
[employer] - [place] at [facility]   PATIENT NAME: Devin Bryant    MR#:  [id number]  DATE OF BIRTH:  1955-12-06  SUBJECTIVE:   patient wants to go home says he wants to quit drinking He has some shakes this am  Had a fall this morning. He says he has neuropathy  REVIEW OF SYSTEMS:    Review of Systems  Constitutional: Negative for fever, chills weight loss HENT: Negative for ear pain, nosebleeds, congestion, facial swelling, rhinorrhea, neck pain, neck stiffness and ear discharge.   Respiratory: Negative for cough, shortness of breath, wheezing  Cardiovascular: Negative for chest pain, palpitations and leg swelling.  Gastrointestinal: Negative for heartburn, abdominal pain, vomiting, diarrhea or consitpation Positive for abdominal distention but has improved Genitourinary: Negative for dysuria, urgency, frequency, hematuria Musculoskeletal: Negative for back pain or joint pain Neurological: Negative for dizziness, seizures, syncope, focal weakness,  numbness and headaches.  Hematological: Does not bruise/bleed easily.  Psychiatric/Behavioral: Negative for hallucinations, confusion, dysphoric mood No active delirium    Tolerating Diet: yes      DRUG ALLERGIES:  No Known Allergies  VITALS:  Blood pressure 128/85, pulse (!) 131, temperature 97.8 F (36.6 C), temperature source Oral, resp. rate 14, height  (1.778 m), weight 74.8 kg (165 lb), SpO2 97 %.  PHYSICAL EXAMINATION:  Constitutional: Appears well-developed and well-nourished. No distress. HENT: Normocephalic. Marland Kitchen Oropharynx is clear and moist.  Eyes: Conjunctivae and EOM are normal. PERRLA, no scleral icterus.  Neck: Normal ROM. Neck supple. No JVD. No tracheal deviation. CVS: RRR, S1/S2 +, no murmurs, no gallops, no carotid bruit.  Pulmonary: Effort and breath sounds normal, no stridor, rhonchi, wheezes, rales.  Abdominal: mild abd distension, NO tenderness, rebound or guarding.   Musculoskeletal: Normal range of motion. No edema and no tenderness.  Neuro: Alert. CN 2-12 grossly intact. No focal deficits. + mild tremors Skin: right foot big toe eschar noted Psychiatric: Normal mood and affect.      LABORATORY PANEL:   CBC  Recent Labs Lab 12/18/16 0611  WBC 7.6  HGB 11.2*  HCT 32.0*  PLT 139*   ------------------------------------------------------------------------------------------------------------------  Chemistries   Recent Labs Lab 12/18/16 0611  NA 133*  K 3.0*  CL 96*  CO2 28  GLUCOSE 90  BUN 8  CREATININE 0.53*  CALCIUM 7.9*  AST 148*  ALT 42  ALKPHOS 228*  BILITOT 8.9*   ------------------------------------------------------------------------------------------------------------------  Cardiac Enzymes  Recent Labs Lab 12/17/16 0826  TROPONINI 0.06*   ------------------------------------------------------------------------------------------------------------------  RADIOLOGY:  Dg Chest 2 View  Result Date: 12/17/2016 CLINICAL DATA:  Vomiting and diarrhea. EXAM: CHEST  2 VIEW COMPARISON:  Chest x-ray dated August 18, 2016. FINDINGS: The cardiomediastinal silhouette is normal in size. Normal pulmonary vascularity. Trace left pleural effusion. No focal consolidation or pneumothorax. No acute osseous abnormality. IMPRESSION: Trace left pleural effusion. Electronically Signed   By: Obie Dredge M.D.   On: 12/17/2016 09:49   Ct Head Wo Contrast  Result Date: 12/18/2016 CLINICAL DATA:  61 year old male status post fall in bathroom, struck head on pipe under sink. EXAM: CT HEAD WITHOUT CONTRAST TECHNIQUE: Contiguous axial images were obtained from the base of the skull through the vertex without intravenous contrast. COMPARISON:  None. FINDINGS: Brain: No midline shift, ventriculomegaly, mass effect, evidence of mass lesion, intracranial hemorrhage or evidence of cortically based acute infarction. Gray-white matter  differentiation is within normal limits for age throughout the brain. No encephalomalacia identified. Vascular: Calcified atherosclerosis at the skull base. No  suspicious intracranial vascular hyperdensity. Skull: Intact.  No acute osseous abnormality identified. Sinuses/Orbits: Clear. Other: No scalp hematoma identified. Visualized orbit soft tissues are within normal limits. IMPRESSION: 1.  No acute traumatic injury identified. 2.  Normal for age non contrast CT appearance of the brain. Electronically Signed   By: Odessa Fleming M.D.   On: 12/18/2016 07:31   US Paracentesis  Result Date: 12/17/2016 INDICATION: Hepatic cirrhosis and ascites EXAM: ULTRASOUND GUIDED  PARACENTESIS MEDICATIONS: None. COMPLICATIONS: None immediate. PROCEDURE: Informed written consent was obtained from the patient after a discussion of the risks, benefits and alternatives to treatment. A timeout was performed prior to the initiation of the procedure. Initial ultrasound scanning demonstrates a large amount of ascites within the right lower abdominal quadrant. The right lower abdomen was prepped and draped in the usual sterile fashion. 1% lidocaine was used for local anesthesia. Following this, a 6 Fr Safe-T-Centesis catheter was introduced. An ultrasound image was saved for documentation purposes. The paracentesis was performed. Initially 1 L of fluid was withdrawn without difficulty. Drainage then became somewhat difficult due to mobile omentum along the margin of the liver. The catheter was attempted to be repositioned which was unsuccessful. It was subsequently removed and a new 6 [place] Safe-T-Centesis catheter placed along the margin of the liver. An additional 1.6 L was then removed. The catheter was removed and a dressing was applied. The patient tolerated the procedure well without immediate post procedural complication. FINDINGS: A total of approximately 2.6 L of bilirubin stained fluid was removed. Samples were sent to the  laboratory as requested by the clinical team. IMPRESSION: Successful ultrasound-guided paracentesis yielding 2.6 liters of peritoneal fluid. A small amount of residual fluid is noted along the liver although the majority of the fluid from the more inferior abdomen has resolved. Mobile omentum was limiting drainage to completion. Electronically Signed   By: Alcide Clever M.D.   On: 12/17/2016 13:08   Dg Hip Unilat With Pelvis 2-3 Views Left  Result Date: 12/18/2016 CLINICAL DATA:  Fall EXAM: DG HIP (WITH OR WITHOUT PELVIS) 2-3V LEFT COMPARISON:  None. FINDINGS: Hip joints and SI joints are symmetric and unremarkable. No acute bony abnormality. Specifically, no fracture, subluxation, or dislocation. Soft tissues are intact. IMPRESSION: No acute bony abnormality. Electronically Signed   By: Charlett Nose M.D.   On: 12/18/2016 08:23   Dg Hip Unilat With Pelvis 2-3 Views Right  Result Date: 12/18/2016 CLINICAL DATA:  Fall. EXAM: DG HIP (WITH OR WITHOUT PELVIS) 2-3V RIGHT COMPARISON:  None. FINDINGS: Hip joints and SI joints are symmetric and unremarkable. No acute bony abnormality. Specifically, no fracture, subluxation, or dislocation. Soft tissues are intact. IMPRESSION: No acute bony abnormality. Electronically Signed   By: Charlett Nose M.D.   On: 12/18/2016 08:23   US Abdomen Limited Ruq  Result Date: 12/17/2016 CLINICAL DATA:  Cirrhosis.  Evaluate portal vein patency EXAM: ULTRASOUND ABDOMEN LIMITED RIGHT UPPER QUADRANT COMPARISON:  Ultrasound 08/18/2016 FINDINGS: Gallbladder: Gallbladder sludge without gallstones. Gallbladder wall 2 mm. Negative sonographic Murphy sign Common bile duct: Diameter: 6 mm Liver: Increased echogenicity of the liver diffusely compatible with chronic liver disease and cirrhosis. Moderate amount of ascites. Portal vein is patent on color Doppler imaging with normal direction of blood flow towards the liver. IMPRESSION: Gallbladder sludge without stone Cirrhotic liver with  ascites. Portal vein patent with normal flow direction. Electronically Signed   By: Marlan Palau M.D.   On: 12/17/2016 10:52     ASSESSMENT AND PLAN:  61 year old male with a history of EtOH abuse, liver cirrhosis and psoriasis who presented with abdominal distention.  1. 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  2. EtOH abuse: Continue CIWA protocol  3. EtOH hepatitis: Continue to monitor LFTs  4. Depression: Continue Lexapro and Remeron  5. Hypokalemia: Replete and recheck in a.m.  6. Chronic hypoxic respiratory failure due toCOPD without signs of exacerbation: Continue inhalers and oxygen Continue theophylline  7. Fall: Physical therapy consultation for discharge planning 8. Tobacco dependence: Patient is encouraged to quit smoking. Counseling was provided for 4 minutes.   D.w wife      Management plans discussed with the patient and he is in agreement.  CODE STATUS: full  TOTAL TIME TAKING CARE OF THIS PATIENT: 30 minutes.     POSSIBLE D/C 1-2 days, DEPENDING ON CLINICAL CONDITION.   Itay Mella M.D on 12/18/2016 at 8:50 AM  Between 7am to 6pm - Pager - [phone] After 6pm go to [email or web address] - password [email or web address]  [facility]  Office  [phone]  CC: Primary care physician; Dorcas Carrow, DO  Note: This dictation was prepared with Dragon dictation along with smaller phrase technology. Any transcriptional errors that result from this process are unintentional.

## 2016-12-18 NOTE — Plan of Care (Addendum)
Problem: Education: Goal: Knowledge of [facility] Education information/materials will improve Outcome: Progressing S/p fall in bathroom, pt stated he was standing to urinate and lost his balance.  States he hit his head on the pipe under the sink.  Denies HA, injury.  Tachycardic during shift, namely post-fall.  VSS otherwise, denies pain.  Pt aware in change in safety plan, BSC/urinal for elimination or supervision in bathroom, low bed will be ordered.  Bed in low position, call bell within reach.  WCTM.  Dr. Tobi Bastos paged, ordered Head CT, Pelvic XR.  Wife, Dois Davenport, aware via call from this RN.

## 2016-12-18 NOTE — Progress Notes (Signed)
Patient lost balance and fell down while urinating. No loss of consciousness No headache Hit his head on the pipe under sink. Will check CT head and x ray of pelvis

## 2016-12-18 NOTE — Progress Notes (Signed)
MEDICATION RELATED CONSULT NOTE - INITIAL   Pharmacy Consult for electrolytes Indication: Hypomagnesium and hypophosphatemia   No Known Allergies  Patient Measurements: Height:  (177.8 cm) Weight: 165 lb (74.8 kg) IBW/kg (Calculated) : 73 Adjusted Body Weight:   Vital Signs: Temp: 98.4 F (36.9 C) (10/16 1014) Temp Source: Oral (10/16 1014) BP: 109/68 (10/16 1014) Pulse Rate: 125 (10/16 1014) Intake/Output from previous day: No intake/output data recorded. Intake/Output from this shift: No intake/output data recorded.  Labs:  Recent Labs  12/17/16 0826 12/17/16 1132 12/18/16 0611 12/18/16 0908  WBC 9.8  --  7.6  --   HGB 12.9*  --  11.2*  --   HCT 37.0*  --  32.0*  --   PLT 152  --  139*  --   APTT  --  34  --   --   CREATININE 0.63  --  0.53*  --   MG  --   --   --  1.3*  PHOS  --   --   --  1.9*  ALBUMIN 2.4*  --  2.0*  --   PROT 8.0  --  7.0  --   AST 195*  --  148*  --   ALT 55  --  42  --   ALKPHOS 283*  --  228*  --   BILITOT 10.8*  --  8.9*  --   BILIDIR 5.7*  --   --   --    Estimated Creatinine Clearance: 97.6 mL/min (A) (by C-G formula based on SCr of 0.53 mg/dL (L)).   Microbiology: Recent Results (from the past 720 hour(s))  Body fluid culture     Status: None (Preliminary result)   Collection Time: 12/17/16 12:25 PM  Result Value Ref Range Status   Specimen Description PERITONEAL  Final   Special Requests NONE  Final   Gram Stain   Final    RARE WBC PRESENT, PREDOMINANTLY MONONUCLEAR NO ORGANISMS SEEN    Culture   Final    NO GROWTH < 24 HOURS Performed at [facility], [address], [place], [place] [address]    Report Status PENDING  Incomplete    Medical History: Past Medical History:  Diagnosis Date  . Adjustment disorder with depressed mood   . Allergy   . Anxiety   . COPD (chronic obstructive pulmonary disease) (HCC)   . ED (erectile dysfunction)   . Elevated glucose   . Elevated serum glutamic pyruvic  transaminase (SGPT) level   . Emphysema lung (HCC)   . GERD (gastroesophageal reflux disease)   . Hypertension   . Hypertriglyceridemia   . Liver disease   . Neuropathy   . Tobacco use     Medications:  Prescriptions Prior to Admission  Medication Sig Dispense Refill Last Dose  . ADVAIR HFA 115-21 MCG/ACT inhaler Inhale 2 puffs into the lungs 2 (two) times daily.    12/17/2016 at 0800  . Cetirizine HCl (ZYRTEC ALLERGY) 10 MG CAPS Take 10 mg by mouth daily.   12/17/2016 at 0800  . escitalopram (LEXAPRO) 10 MG tablet Take 1 tablet (10 mg total) by mouth daily. 90 tablet 1 12/17/2016 at 0800  . esomeprazole (NEXIUM) 20 MG capsule Take 20 mg by mouth daily at 12 noon.   12/17/2016 at 0800  . fluticasone (FLONASE) 50 MCG/ACT nasal spray Place 1 spray into both nostrils daily.    12/17/2016 at 0800  . loperamide (IMODIUM) 2 MG capsule Take 1  capsule (2 mg total) by mouth every 6 (six) hours as needed for diarrhea or loose stools. 30 capsule 0 12/17/2016 at 0800  . mirtazapine (REMERON) 30 MG tablet Take 1 tablet (30 mg total) by mouth at bedtime. 90 tablet 1 12/16/2016 at 2000  . Multiple Vitamin (MULTIVITAMIN) tablet Take 1 tablet by mouth daily.   12/17/2016 at 0800  . pregabalin (LYRICA) 75 MG capsule Take 1 capsule (75 mg total) by mouth 2 (two) times daily. 180 capsule 4 12/17/2016 at 0800  . spironolactone (ALDACTONE) 25 MG tablet Take 1 tablet (25 mg total) by mouth daily. 90 tablet 1 12/17/2016 at 0800  . theophylline (THEODUR) 300 MG 12 hr tablet Take 300 mg by mouth 2 (two) times daily.  11 12/17/2016 at 0800  . tiotropium (SPIRIVA HANDIHALER) 18 MCG inhalation capsule Place 1 capsule (18 mcg total) into inhaler and inhale daily. 30 capsule 0 12/17/2016 at Unknown time  . traMADol (ULTRAM) 50 MG tablet Take 1 tablet (50 mg total) by mouth 2 (two) times daily as needed. 56 tablet 0 12/17/2016 at 0800  . albuterol (PROVENTIL HFA;VENTOLIN HFA) 108 (90 Base) MCG/ACT inhaler Inhale 2 puffs  into the lungs every 6 (six) hours as needed for wheezing or shortness of breath. 1 Inhaler 12 prn at prn  . albuterol (PROVENTIL) (2.5 MG/3ML) 0.083% nebulizer solution Take 3 mLs (2.5 mg total) by nebulization every 4 (four) hours as needed for wheezing or shortness of breath. 50 mL 3 prn at prn  . Diphenhyd-Hydrocort-Nystatin (FIRST-DUKES MOUTHWASH) SUSP Use as directed 5 mLs in the mouth or throat 2 (two) times daily. (Patient not taking: Reported on 12/17/2016) 237 mL 2 Not Taking at Unknown time  . lidocaine (XYLOCAINE) 5 % ointment Apply 1 application topically as needed. (Patient not taking: Reported on 12/17/2016) 35.44 g 1 Not Taking at Unknown time  . nystatin (MYCOSTATIN) 100000 UNIT/ML suspension Take 5 mLs by mouth 2 (two) times daily. Uses with his inhalers  13 prn at prn  . ondansetron (ZOFRAN ODT) 4 MG disintegrating tablet Take 1 tablet (4 mg total) by mouth every 8 (eight) hours as needed for nausea or vomiting. 20 tablet 1 prn at prn  . spironolactone (ALDACTONE) 25 MG tablet TAKE 1 TABLET BY MOUTH EVERY DAY (Patient not taking: Reported on 12/17/2016) 90 tablet 1 Not Taking at Unknown time   Scheduled:  . escitalopram  10 mg Oral Daily  . feeding supplement (ENSURE ENLIVE)  237 mL Oral TID BM  . fluticasone  1 spray Each Nare Daily  . folic acid  1 mg Oral Daily  . furosemide  20 mg Oral Daily  . heparin  5,000 Units Subcutaneous Q8H  . ipratropium-albuterol  3 mL Nebulization TID  . lactulose  30 g Oral BID  . mirtazapine  30 mg Oral QHS  . mometasone-formoterol  2 puff Inhalation BID  . multivitamin with minerals  1 tablet Oral Daily  . nicotine  21 mg Transdermal Daily  . nystatin  5 mL Oral BID  . pantoprazole  40 mg Oral Daily  . pregabalin  75 mg Oral BID  . spironolactone  50 mg Oral Daily  . theophylline  300 mg Oral BID  . thiamine  100 mg Oral Daily   Or  . thiamine  100 mg Intravenous Daily  . tiotropium  18 mcg Inhalation Daily    Assessment: Pharmacy  consulted to manage electrolytes in this 61 year old male.  K= 3.0; Magnesium: 1.3;  Phosphorus: 1.9  Goal of Therapy:  Normal electrolytes.   Plan:  Will give Magnesium 4 g IV x 1 and KPhos 20 mmol IV x 1. Will recheck electrolytes in the am.    Lynnsey Barbara D 12/18/2016,2:27 PM

## 2016-12-18 NOTE — Progress Notes (Signed)
Patient's wife concern about being discharged due to  patient being drowsy, MD notified, discharged held per Dr. Imogene Burn. Patient became more alert an hour later, orders to discharge per Dr. Imogene Burn.Patient discharged at 2230, VVS, discharged instructions explained to patient and wife, discharged to home via wife.

## 2016-12-18 NOTE — Progress Notes (Signed)
Nutrition Education Note  RD consulted for nutrition education regarding a low sodium diet  RD provided "Low Sodium Nutrition Therapy" handout from the Academy of Nutrition and Dietetics. Reviewed patient's dietary recall. Provided examples on ways to decrease sodium intake in diet. Discouraged intake of processed foods and use of salt shaker. Encouraged fresh fruits and vegetables as well as whole grain sources of carbohydrates to maximize fiber intake.   RD discussed why it is important for patient to adhere to diet recommendations, and emphasized the role of fluids, foods to avoid, and importance of weighing self daily. Teach back method used.  Expect good compliance.  Body mass index is 23.68 kg/m. Pt meets criteria for normal weight based on current BMI.  Current diet order is soft, patient is consuming approximately 75% of meals at this time.  RD following this pt  Betsey Holiday MS, RD, LDN Pager #[phone] After Hours Pager: [phone]

## 2016-12-19 LAB — CERULOPLASMIN: CERULOPLASMIN: 31.6 mg/dL — AB (ref 16.0–31.0)

## 2016-12-19 LAB — TOTAL BILIRUBIN, BODY FLUID: TOTBILIFLUID: 1.1 mg/dL

## 2016-12-19 LAB — MITOCHONDRIAL ANTIBODIES: Mitochondrial M2 Ab, IgG: 9.4 Units (ref 0.0–20.0)

## 2016-12-19 LAB — ANA W/REFLEX IF POSITIVE: Anti Nuclear Antibody(ANA): NEGATIVE

## 2016-12-19 LAB — ANTI-SMOOTH MUSCLE ANTIBODY, IGG: F-Actin IgG: 17 Units (ref 0–19)

## 2016-12-19 LAB — AFP TUMOR MARKER: AFP, SERUM, TUMOR MARKER: 7.3 ng/mL (ref 0.0–8.3)

## 2016-12-20 LAB — BODY FLUID CULTURE: CULTURE: NO GROWTH

## 2016-12-21 ENCOUNTER — Telehealth: Payer: Self-pay | Admitting: Family Medicine

## 2016-12-21 NOTE — Telephone Encounter (Signed)
Perfect. As below

## 2016-12-21 NOTE — Telephone Encounter (Signed)
Verbal order needed for PT for the following POC--  1st week once  2nd week twice a week 3rd week once  [phone] [facility]  Thanks

## 2016-12-21 NOTE — Telephone Encounter (Signed)
Verbal order given  

## 2016-12-27 ENCOUNTER — Ambulatory Visit: Payer: BLUE CROSS/BLUE SHIELD | Admitting: Family Medicine

## 2017-01-03 ENCOUNTER — Telehealth: Payer: Self-pay | Admitting: Family Medicine

## 2017-01-03 NOTE — Telephone Encounter (Signed)
Copied from CRM #[id number]. Topic: Quick Communication - See Telephone Encounter >> Jan 03, 2017  9:23 AM Diana EvesHoyt, Maryann B wrote: CRM for notification. See Telephone encounter for:  01/03/17. Pts heart rate was 127 at rest yesterday couldn't work with pt due to heart rate. PTA is needing guidelines on how to proceed with his heart rate being elevated. Devin Bryant is the PTA with [facility] who called regarding this pt please call back at [phone].

## 2017-01-03 NOTE — Telephone Encounter (Signed)
Noted  

## 2017-01-03 NOTE — Telephone Encounter (Signed)
Routing to provider  

## 2017-01-07 ENCOUNTER — Other Ambulatory Visit: Payer: Self-pay | Admitting: Family Medicine

## 2017-01-07 ENCOUNTER — Telehealth: Payer: Self-pay | Admitting: Family Medicine

## 2017-01-07 MED ORDER — TRAMADOL HCL 50 MG PO TABS: 50.0000 mg | ORAL_TABLET | Freq: Two times a day (BID) | ORAL | 0 refills | Status: DC | PRN

## 2017-01-07 NOTE — Telephone Encounter (Signed)
Patient needs follow up appointment from being in the hospital. HR is likely due to deconditioning and alcohol abuse. I have not seen him since he got out of the hospital.

## 2017-01-07 NOTE — Telephone Encounter (Signed)
Copied from CRM [phone]. Topic: Quick Communication - See Telephone Encounter >> Jan 07, 2017 12:47 PM Viviann SpareWhite, Selina wrote: CRM for notification. See Telephone encounter for: 01/07/17. Pts heart rate was 135 - 140 at rest today couldn't work with pt due to heart rate. Pt is not on anything that would make his heart rate high. PT is needing guidelines on how to proceed with his heart rate being high. Elmer Balesmily Parker is the PT with [facility] who called regarding this pt please call back at [phone].

## 2017-01-07 NOTE — Telephone Encounter (Signed)
Routing to provider  

## 2017-01-07 NOTE — Telephone Encounter (Signed)
Please get patient scheduled for a hospital follow up

## 2017-01-18 ENCOUNTER — Encounter: Payer: Self-pay | Admitting: Family Medicine

## 2017-01-18 ENCOUNTER — Telehealth: Payer: Self-pay | Admitting: Family Medicine

## 2017-01-18 ENCOUNTER — Ambulatory Visit: Payer: BLUE CROSS/BLUE SHIELD | Admitting: Family Medicine

## 2017-01-18 VITALS — BP 104/69 | HR 114 | Wt 162.1 lb

## 2017-01-18 DIAGNOSIS — E781 Pure hyperglyceridemia: Secondary | ICD-10-CM

## 2017-01-18 DIAGNOSIS — I10 Essential (primary) hypertension: Secondary | ICD-10-CM | POA: Diagnosis not present

## 2017-01-18 DIAGNOSIS — D72829 Elevated white blood cell count, unspecified: Secondary | ICD-10-CM | POA: Diagnosis not present

## 2017-01-18 DIAGNOSIS — E43 Unspecified severe protein-calorie malnutrition: Secondary | ICD-10-CM | POA: Diagnosis not present

## 2017-01-18 DIAGNOSIS — R740 Nonspecific elevation of levels of transaminase and lactic acid dehydrogenase [LDH]: Secondary | ICD-10-CM

## 2017-01-18 DIAGNOSIS — F10239 Alcohol dependence with withdrawal, unspecified: Secondary | ICD-10-CM

## 2017-01-18 DIAGNOSIS — D7589 Other specified diseases of blood and blood-forming organs: Secondary | ICD-10-CM

## 2017-01-18 DIAGNOSIS — K7011 Alcoholic hepatitis with ascites: Secondary | ICD-10-CM | POA: Diagnosis not present

## 2017-01-18 DIAGNOSIS — J438 Other emphysema: Secondary | ICD-10-CM | POA: Diagnosis not present

## 2017-01-18 DIAGNOSIS — F411 Generalized anxiety disorder: Secondary | ICD-10-CM | POA: Diagnosis not present

## 2017-01-18 DIAGNOSIS — G629 Polyneuropathy, unspecified: Secondary | ICD-10-CM

## 2017-01-18 DIAGNOSIS — R7401 Elevation of levels of liver transaminase levels: Secondary | ICD-10-CM

## 2017-01-18 DIAGNOSIS — R000 Tachycardia, unspecified: Secondary | ICD-10-CM | POA: Diagnosis not present

## 2017-01-18 DIAGNOSIS — J9621 Acute and chronic respiratory failure with hypoxia: Secondary | ICD-10-CM | POA: Diagnosis not present

## 2017-01-18 DIAGNOSIS — R7309 Other abnormal glucose: Secondary | ICD-10-CM | POA: Diagnosis not present

## 2017-01-18 DIAGNOSIS — E876 Hypokalemia: Secondary | ICD-10-CM | POA: Diagnosis not present

## 2017-01-18 DIAGNOSIS — J9622 Acute and chronic respiratory failure with hypercapnia: Secondary | ICD-10-CM

## 2017-01-18 LAB — BAYER DCA HB A1C WAIVED: HB A1C (BAYER DCA - WAIVED): 3.7 % (ref <7.0)

## 2017-01-18 MED ORDER — FUROSEMIDE 20 MG PO TABS: 20.0000 mg | ORAL_TABLET | Freq: Every day | ORAL | 1 refills | Status: DC

## 2017-01-18 MED ORDER — SPIRONOLACTONE 50 MG PO TABS: 50.0000 mg | ORAL_TABLET | Freq: Every day | ORAL | 1 refills | Status: DC

## 2017-01-18 MED ORDER — PREGABALIN 150 MG PO CAPS: 150.0000 mg | ORAL_CAPSULE | Freq: Two times a day (BID) | ORAL | 1 refills | Status: DC

## 2017-01-18 MED ORDER — THEOPHYLLINE ER 300 MG PO TB12: 300.0000 mg | ORAL_TABLET | Freq: Two times a day (BID) | ORAL | 11 refills | Status: DC

## 2017-01-18 MED ORDER — MIRTAZAPINE 30 MG PO TABS: 30.0000 mg | ORAL_TABLET | Freq: Every day | ORAL | 1 refills | Status: DC

## 2017-01-18 MED ORDER — PREGABALIN 150 MG PO CAPS: 150.0000 mg | ORAL_CAPSULE | Freq: Three times a day (TID) | ORAL | 1 refills | Status: DC

## 2017-01-18 NOTE — Assessment & Plan Note (Signed)
Not doing well. He claims he drinks because of the pain from the neuropathy. Will increase lyrica to 150mg  TID and recheck in 2-3 weeks to see how he's doing. Refill of his tramadol given today. Monitor closely.

## 2017-01-18 NOTE — Assessment & Plan Note (Signed)
Likely multifactorial and in part due to his breathing and his cirrhosis. With his BP so low right now, I'm not sure about starting a beta blocker- although pending his EGD, this may be indicated. Will get him into cardiology for evaluation and their input. Referral generated. He has seen Dr. Lady GaryFath previously.

## 2017-01-18 NOTE — Telephone Encounter (Signed)
Copied from CRM [phone]. Topic: General - Other >> Jan 18, 2017  3:37 PM Gerrianne ScalePayne, Angela L wrote: Reason for CRM: Angelique Blonderenise from [facility] had receive a referral and wanted to know if a nurse can call her with a verbal order to proceed with hospice [place] phone number is [phone]  >> Jan 18, 2017  4:04 PM Reel, Gwenith Spitziffany L, CMA wrote: Verbal given for hospital to start.

## 2017-01-18 NOTE — Assessment & Plan Note (Signed)
Rechecking levels today. Likely due to alcoholism. Await results.  

## 2017-01-18 NOTE — Assessment & Plan Note (Signed)
A1c at 3.7 with signs of malnutrition.

## 2017-01-18 NOTE — Assessment & Plan Note (Signed)
Rechecking levels today. Likely due to alcoholism. Await results.

## 2017-01-18 NOTE — Telephone Encounter (Signed)
Please let her know that he's going to cardiology for the HR. Thank you for letting me know.

## 2017-01-18 NOTE — Assessment & Plan Note (Signed)
Rechecking levels today. Await results.  

## 2017-01-18 NOTE — Assessment & Plan Note (Signed)
BP under good control. Will have to watch closely given lower BP and weight loss to make sure not going too low. Checking labs today.

## 2017-01-18 NOTE — Assessment & Plan Note (Signed)
Severe. Likely due to acites and cirrhosis. Already on ensure and remeron. Referral to hospice made today. Await their input.

## 2017-01-18 NOTE — Assessment & Plan Note (Signed)
Continue to follow with pulmonology. Refill of his theophyline given today. Continue inhalers. Continue to monitor.

## 2017-01-18 NOTE — Telephone Encounter (Signed)
Called and spoke with patient's wife, she wanted to let us know who he saw while in the hospital. Dr.Mody.

## 2017-01-18 NOTE — Assessment & Plan Note (Signed)
Very frank discussion with patient today about how his alcohol use is going to kill him. Discussed that if he is ready to go into detox, we can help arrange that and can get him medication when he gets out to help him with his cravings. He has been through detox several times before and always goes back to drinking. He is not sure he is ready for this just yet, but will really think about it.

## 2017-01-18 NOTE — Telephone Encounter (Signed)
-   As below

## 2017-01-18 NOTE — Assessment & Plan Note (Signed)
Significant ascites today. Trying to get him into GI ASAP- urgent referral generated. Will check with radiology at the hospital to see about ordering a paracentesis. Will continue lasix and spironolactone.  Patient refused lactulose as it gives him diarrhea. Very frank conversation with patient about his need to stop drinking had today. Will get him hospice evaluation. Referral generated today.

## 2017-01-18 NOTE — Telephone Encounter (Signed)
Copied from CRM [phone]. Topic: Quick Communication - See Telephone Encounter >> Jan 18, 2017  3:38 PM Guinevere FerrariMorris, Luzmaria Devaux E, NT wrote: CRM for notification. See Telephone encounter for: Patients's wife called and said they just left the office forgot to mention some information to the doctor. Pt. Wife would like a call back.   01/18/17.

## 2017-01-18 NOTE — Assessment & Plan Note (Signed)
Continue to follow with pulmonology. Refill of his theophyline given today. Continue inhalers. Continue to monitor.  

## 2017-01-18 NOTE — Telephone Encounter (Signed)
Copied from CRM [phone]. Topic: Inquiry >> Jan 18, 2017  4:13 PM Devin Bryant, Devin Bryant wrote: Reason for CRM: Elmer Balesmily Parker from [facility] called and wants to relate: Pt has had 6 or 7 falls last week; no significant injuries She would like to request extension on services for 2x's a week for 2 weeks then once a week for one week And if there is any extra guidance on workouts due to the fact that the pt's heart rate is usually high; today it was 106/108

## 2017-01-18 NOTE — Progress Notes (Signed)
BP 104/69 (BP Location: Left Arm, Patient Position: Sitting, Cuff Size: Normal)   Pulse (!) 114   Wt 162 lb 2 oz (73.5 kg)   SpO2 97%   BMI 23.26 kg/m    Subjective:    Patient ID: Devin Bryant, male    DOB: 10/28/1955, 61 y.o.   MRN: [id number]  HPI: Devin Bryant is a 61 y.o. male  Chief Complaint  Patient presents with  . Hospitalization Follow-up    abdominal swelling and vomiting, 6 liters withdrawn from stomach. He will need another one done, he will need a order from his primary doctor  . Fall  . Extremity Weakness  . Leg Swelling  . Diarrhea   HOSPITAL FOLLOW UP- went to the ER about a month ago with severe ascites, ended up having 6L of fluid drained out of his belly. Started on lactulose and continued on lasix and spironalactone. Offered detox from alcohol in the hospital and he declined. Has not done well since his discharge. Sent home with PT- they are rarely able to do anything as they are concerned about his HR which has been consistently in the 130s-140s. He is falling repeatedly and has hit his head several times. He continues to drink at least 3-4 drinks a day. He is not sure if he wants to quit drinking. He continues to lose weight. He has been having a lot of swelling in his legs. Had bad diarrhea a few days ago, but has since resolved. Wife is having a very difficult time caring for him. He is refusing to use a walker or a wheelchair and continues to fall. Their bathroom is on the 2nd floor- so keeping up with his hygiene has been very difficult. She notices that his belly is very swollen again- and they both think he may need another parecentesis Time since discharge: 1 month Hospital/facility: [facility] Diagnosis: Alcoholic hepatitis with acites, severe protein-calorie malnutrition Procedures/tests: paracentesis, US of liver- showed no liver lesions Consultants: GI New medications: lasix, aldactone, lactulose  Discharge instructions:  Needs EGD Status: worse    Continues to have issues with his breathing. Ran out of his theophyline. Notes that his HR was down 20bpm after he was out of it. He is seeing pulmonology.  HYPERTENSION / HYPERLIPIDEMIA Satisfied with current treatment? yes Duration of hypertension: chronic BP monitoring frequency: not checking BP range:  BP medication side effects: no Past BP meds: lasix, spironalactone Duration of hyperlipidemia: chronic Cholesterol medication side effects: not on anything Cholesterol supplements: none Medication compliance: fair compliance Aspirin: no Recent stressors: yes Recurrent headaches: no Visual changes: no Palpitations: yes Dyspnea: yes Chest pain: no Lower extremity edema: yes Dizzy/lightheaded: yes  NEUROPATHY- increased on his own to 150mg  of the lyrica daily. No better. Tramadol does help Neuropathy status: uncontrolled  Satisfied with current treatment?: no Medication side effects: no Medication compliance:  good compliance Location: feet and legs Pain: yes Severity: severe  Quality:  Burning  Frequency: at nights Bilateral: yes Symmetric: yes Numbness: yes Decreased sensation: yes Weakness: yes Context: worse   Relevant past medical, surgical, family and social history reviewed and updated as indicated. Interim medical history since our last visit reviewed. Allergies and medications reviewed and updated.  Review of Systems  Constitutional: Positive for activity change, appetite change, fatigue and unexpected weight change. Negative for chills, diaphoresis and fever.  HENT: Negative.   Respiratory: Positive for chest tightness and shortness of breath. Negative for apnea, cough, choking, wheezing and stridor.   Cardiovascular:  Positive for palpitations and leg swelling. Negative for chest pain.  Gastrointestinal: Positive for abdominal distention, diarrhea and nausea. Negative for abdominal pain, anal bleeding, blood in stool, constipation, rectal pain and  vomiting.  Musculoskeletal: Positive for myalgias. Negative for arthralgias, back pain, gait problem, joint swelling, neck pain and neck stiffness.  Skin: Negative.   Neurological: Positive for weakness and numbness. Negative for dizziness, tremors, seizures, syncope, facial asymmetry, speech difficulty, light-headedness and headaches.  Psychiatric/Behavioral: Negative.     Per HPI unless specifically indicated above     Objective:    BP 104/69 (BP Location: Left Arm, Patient Position: Sitting, Cuff Size: Normal)   Pulse (!) 114   Wt 162 lb 2 oz (73.5 kg)   SpO2 97%   BMI 23.26 kg/m   Wt Readings from Last 3 Encounters:  01/18/17 162 lb 2 oz (73.5 kg)  12/17/16 165 lb (74.8 kg)  09/11/16 175 lb 2 oz (79.4 kg)    Physical Exam  Constitutional: He is oriented to person, place, and time. He appears well-developed. No distress.  Cachetic, ill appearing   HENT:  Head: Normocephalic and atraumatic.  Right Ear: Hearing normal.  Left Ear: Hearing normal.  Nose: Nose normal.  Eyes: Conjunctivae and lids are normal. Right eye exhibits no discharge. Left eye exhibits no discharge. No scleral icterus.  Cardiovascular: Normal rate, regular rhythm, normal heart sounds and intact distal pulses. Exam reveals no gallop and no friction rub.  No murmur heard. Pulmonary/Chest: Effort normal. No respiratory distress. He has wheezes. He has no rales. He exhibits no tenderness.  Abdominal: Bowel sounds are normal. He exhibits distension (tensely distended- no fluid wave). He exhibits no mass. There is no tenderness. There is no rebound and no guarding.  Musculoskeletal: Normal range of motion. He exhibits edema.  Neurological: He is alert and oriented to person, place, and time.  Skin: Skin is warm, dry and intact. No rash noted. He is not diaphoretic. No erythema. No pallor.  Psychiatric: He has a normal mood and affect. His speech is normal and behavior is normal. Judgment and thought content  normal. Cognition and memory are normal.  Nursing note and vitals reviewed.   Results for orders placed or performed in visit on 01/18/17  Bayer DCA Hb A1c Waived  Result Value Ref Range   Bayer DCA Hb A1c Waived 3.7 <7.0 %      Assessment & Plan:   Problem List Items Addressed This Visit      Cardiovascular and Mediastinum   Hypertension    BP under good control. Will have to watch closely given lower BP and weight loss to make sure not going too low. Checking labs today.      Relevant Medications   spironolactone (ALDACTONE) 50 MG tablet   furosemide (LASIX) 20 MG tablet   Other Relevant Orders   Comprehensive metabolic panel   UA/M w/rflx Culture, Routine     Respiratory   COPD (chronic obstructive pulmonary disease) (HCC)    Continue to follow with pulmonology. Refill of his theophyline given today. Continue inhalers. Continue to monitor.       Relevant Medications   theophylline (THEODUR) 300 MG 12 hr tablet   Other Relevant Orders   Comprehensive metabolic panel   TSH   UA/M w/rflx Culture, Routine   Ambulatory referral to Hospice   Acute on chronic respiratory failure with hypoxia and hypercapnia (HCC)    Continue to follow with pulmonology. Refill of his theophyline given today. Continue  inhalers. Continue to monitor.       RESOLVED: Acute on chronic respiratory failure with hypoxia (HCC)   Relevant Orders   CBC with Differential/Platelet   Comprehensive metabolic panel   TSH   UA/M w/rflx Culture, Routine   Ambulatory referral to Hospice     Digestive   Alcoholic hepatitis with ascites - Primary    Significant ascites today. Trying to get him into GI ASAP- urgent referral generated. Will check with radiology at the hospital to see about ordering a paracentesis. Will continue lasix and spironolactone.  Patient refused lactulose as it gives him diarrhea. Very frank conversation with patient about his need to stop drinking had today. Will get him hospice  evaluation. Referral generated today.      Relevant Orders   Comprehensive metabolic panel   UA/M w/rflx Culture, Routine   Ambulatory referral to Hospice   Ambulatory referral to Gastroenterology     Nervous and Auditory   Alcohol dependence with withdrawal with complication [facility])    Very frank discussion with patient today about how his alcohol use is going to kill him. Discussed that if he is ready to go into detox, we can help arrange that and can get him medication when he gets out to help him with his cravings. He has been through detox several times before and always goes back to drinking. He is not sure he is ready for this just yet, but will really think about it.       Neuropathy    Not doing well. He claims he drinks because of the pain from the neuropathy. Will increase lyrica to 150mg  TID and recheck in 2-3 weeks to see how he's doing. Refill of his tramadol given today. Monitor closely.        Other   Hypertriglyceridemia    Rechecking levels today. Likely due to alcoholism. Await results.       Relevant Medications   spironolactone (ALDACTONE) 50 MG tablet   furosemide (LASIX) 20 MG tablet   Other Relevant Orders   Comprehensive metabolic panel   Lipid Panel w/o Chol/HDL Ratio   UA/M w/rflx Culture, Routine   Elevated serum glutamic pyruvic transaminase (SGPT) level    Rechecking levels today. Likely due to alcoholism. Await results.       Relevant Orders   Comprehensive metabolic panel   UA/M w/rflx Culture, Routine   Generalized anxiety disorder   Relevant Medications   mirtazapine (REMERON) 30 MG tablet   Other Relevant Orders   Comprehensive metabolic panel   TSH   UA/M w/rflx Culture, Routine   Macrocytosis without anemia    Rechecking levels today. Likely due to alcoholism. Await results.       Hypokalemia    Rechecking levels today. Await results.       Relevant Orders   Comprehensive metabolic panel   UA/M w/rflx Culture, Routine    Leukocytosis    Rechecking levels today. Await results.       Relevant Orders   Comprehensive metabolic panel   UA/M w/rflx Culture, Routine   Protein-calorie malnutrition, severe    Severe. Likely due to acites and cirrhosis. Already on ensure and remeron. Referral to hospice made today. Await their input.       Relevant Orders   Comprehensive metabolic panel   UA/M w/rflx Culture, Routine   Ambulatory referral to Hospice   Tachycardia    Likely multifactorial and in part due to his breathing and his cirrhosis. With his BP so  low right now, I'm not sure about starting a beta blocker- although pending his EGD, this may be indicated. Will get him into cardiology for evaluation and their input. Referral generated. He has seen Dr. Lady Gary previously.      Relevant Orders   Ambulatory referral to Cardiology   RESOLVED: Elevated glucose    A1c at 3.7 with signs of malnutrition.       Relevant Orders   Bayer DCA Hb A1c Waived (Completed)   Comprehensive metabolic panel   UA/M w/rflx Culture, Routine      >45 minutes spent today in direct patient care and coordination of care.   Follow up plan: Return 2-3 weeks, for follow up neuropathy.

## 2017-01-19 LAB — COMPREHENSIVE METABOLIC PANEL
ALK PHOS: 247 IU/L — AB (ref 39–117)
ALT: 20 IU/L (ref 0–44)
AST: 58 IU/L — ABNORMAL HIGH (ref 0–40)
Albumin/Globulin Ratio: 0.6 — ABNORMAL LOW (ref 1.2–2.2)
Albumin: 2.6 g/dL — ABNORMAL LOW (ref 3.6–4.8)
BUN/Creatinine Ratio: 9 — ABNORMAL LOW (ref 10–24)
BUN: 6 mg/dL — AB (ref 8–27)
Bilirubin Total: 2.4 mg/dL — ABNORMAL HIGH (ref 0.0–1.2)
CALCIUM: 8.5 mg/dL — AB (ref 8.6–10.2)
CO2: 30 mmol/L — AB (ref 20–29)
CREATININE: 0.66 mg/dL — AB (ref 0.76–1.27)
Chloride: 98 mmol/L (ref 96–106)
GFR calc Af Amer: 119 mL/min/{1.73_m2} (ref >59)
GFR, EST NON AFRICAN AMERICAN: 103 mL/min/{1.73_m2} (ref >59)
GLUCOSE: 116 mg/dL — AB (ref 65–99)
Globulin, Total: 4.4 g/dL (ref 1.5–4.5)
Potassium: 3.7 mmol/L (ref 3.5–5.2)
SODIUM: 142 mmol/L (ref 134–144)
Total Protein: 7 g/dL (ref 6.0–8.5)

## 2017-01-19 LAB — CBC WITH DIFFERENTIAL/PLATELET
BASOS ABS: 0.1 10*3/uL (ref 0.0–0.2)
Basos: 1 %
EOS (ABSOLUTE): 0.1 10*3/uL (ref 0.0–0.4)
EOS: 1 %
HEMATOCRIT: 38 % (ref 37.5–51.0)
Hemoglobin: 13.1 g/dL (ref 13.0–17.7)
IMMATURE GRANS (ABS): 0.1 10*3/uL (ref 0.0–0.1)
IMMATURE GRANULOCYTES: 1 %
LYMPHS: 11 %
Lymphocytes Absolute: 1.1 10*3/uL (ref 0.7–3.1)
MCH: 39.5 pg — ABNORMAL HIGH (ref 26.6–33.0)
MCHC: 34.5 g/dL (ref 31.5–35.7)
MCV: 115 fL — ABNORMAL HIGH (ref 79–97)
MONOCYTES: 10 %
Monocytes Absolute: 1 10*3/uL — ABNORMAL HIGH (ref 0.1–0.9)
NEUTROS PCT: 76 %
Neutrophils Absolute: 7.6 10*3/uL — ABNORMAL HIGH (ref 1.4–7.0)
Platelets: 207 10*3/uL (ref 150–379)
RBC: 3.32 x10E6/uL — AB (ref 4.14–5.80)
RDW: 13.1 % (ref 12.3–15.4)
WBC: 9.9 10*3/uL (ref 3.4–10.8)

## 2017-01-19 LAB — LIPID PANEL W/O CHOL/HDL RATIO
CHOLESTEROL TOTAL: 167 mg/dL (ref 100–199)
HDL: 19 mg/dL — ABNORMAL LOW (ref >39)
LDL CALC: 115 mg/dL — AB (ref 0–99)
TRIGLYCERIDES: 164 mg/dL — AB (ref 0–149)
VLDL CHOLESTEROL CAL: 33 mg/dL (ref 5–40)

## 2017-01-19 LAB — UA/M W/RFLX CULTURE, ROUTINE
BILIRUBIN UA: NEGATIVE
GLUCOSE, UA: NEGATIVE
Leukocytes, UA: NEGATIVE
Nitrite, UA: NEGATIVE
PROTEIN UA: NEGATIVE
RBC, UA: NEGATIVE
Specific Gravity, UA: 1.01 (ref 1.005–1.030)
UUROB: 1 mg/dL (ref 0.2–1.0)
pH, UA: 5.5 (ref 5.0–7.5)

## 2017-01-19 LAB — TSH: TSH: 1.49 u[IU]/mL (ref 0.450–4.500)

## 2017-01-21 ENCOUNTER — Other Ambulatory Visit: Payer: Self-pay | Admitting: Family Medicine

## 2017-01-21 ENCOUNTER — Encounter: Payer: Self-pay | Admitting: Family Medicine

## 2017-01-21 DIAGNOSIS — K7031 Alcoholic cirrhosis of liver with ascites: Secondary | ICD-10-CM

## 2017-01-21 NOTE — Progress Notes (Signed)
Referral to GI  

## 2017-01-22 ENCOUNTER — Telehealth: Payer: Self-pay | Admitting: Family Medicine

## 2017-01-22 NOTE — Telephone Encounter (Signed)
There is no extra guidance on his HR. He is seeing cardiology. This is normal for him.   Please extend services.

## 2017-01-22 NOTE — Telephone Encounter (Signed)
Copied from CRM [id number]. Topic: Inquiry >> Jan 18, 2017  4:13 PM Alexander BergeronBarksdale, Harvey B wrote: Reason for CRM: Elmer Balesmily Parker from [facility] called and wants to relate: Pt has had 6 or 7 falls last week; no significant injuries She would like to request extension on services for 2x's a week for 2 weeks then once a week for one week And if there is any extra guidance on workouts due to the fact that the pt's heart rate is usually high; today it was 106/108  >> Jan 22, 2017  1:13 PM Windy KalataMichael, Taylor L, NT wrote: Irving Burtonmily is calling back stating she has not received a call back.

## 2017-01-22 NOTE — Telephone Encounter (Signed)
Order given to MarseillesEmily.

## 2017-01-22 NOTE — Telephone Encounter (Signed)
Routing to provider  

## 2017-01-23 ENCOUNTER — Ambulatory Visit: Payer: BLUE CROSS/BLUE SHIELD | Admitting: Gastroenterology

## 2017-01-23 ENCOUNTER — Ambulatory Visit: Payer: BLUE CROSS/BLUE SHIELD

## 2017-01-23 ENCOUNTER — Other Ambulatory Visit
Admission: RE | Admit: 2017-01-23 | Discharge: 2017-01-23 | Disposition: A | Discharge status: Home / Self Care | Payer: BLUE CROSS/BLUE SHIELD | Source: Ambulatory Visit | Attending: Gastroenterology | Admitting: Gastroenterology

## 2017-01-23 ENCOUNTER — Ambulatory Visit
Admission: RE | Admit: 2017-01-23 | Discharge: 2017-01-23 | Disposition: A | Discharge status: Home / Self Care | Payer: BLUE CROSS/BLUE SHIELD | Source: Ambulatory Visit | Attending: Gastroenterology | Admitting: Gastroenterology

## 2017-01-23 ENCOUNTER — Other Ambulatory Visit: Payer: Self-pay

## 2017-01-23 ENCOUNTER — Encounter: Payer: Self-pay | Admitting: Gastroenterology

## 2017-01-23 VITALS — BP 108/70 | HR 109 | Temp 97.6°F | Ht 70.0 in | Wt 168.8 lb

## 2017-01-23 DIAGNOSIS — R188 Other ascites: Secondary | ICD-10-CM | POA: Diagnosis not present

## 2017-01-23 DIAGNOSIS — K219 Gastro-esophageal reflux disease without esophagitis: Secondary | ICD-10-CM | POA: Insufficient documentation

## 2017-01-23 DIAGNOSIS — K7031 Alcoholic cirrhosis of liver with ascites: Secondary | ICD-10-CM

## 2017-01-23 DIAGNOSIS — K5900 Constipation, unspecified: Secondary | ICD-10-CM

## 2017-01-23 LAB — BODY FLUID CELL COUNT WITH DIFFERENTIAL
Eos, Fluid: 0 %
Lymphs, Fluid: 31 %
Monocyte-Macrophage-Serous Fluid: 50 %
Neutrophil Count, Fluid: 19 %
Total Nucleated Cell Count, Fluid: 244 cu mm

## 2017-01-23 LAB — PROTIME-INR
INR: 1.11
Prothrombin Time: 14.2 s (ref 11.4–15.2)

## 2017-01-23 LAB — PATHOLOGIST SMEAR REVIEW

## 2017-01-23 MED ORDER — FUROSEMIDE 40 MG PO TABS: 40.0000 mg | ORAL_TABLET | Freq: Every day | ORAL | 1 refills | Status: DC

## 2017-01-23 MED ORDER — SPIRONOLACTONE 100 MG PO TABS: 100.0000 mg | ORAL_TABLET | Freq: Every day | ORAL | 2 refills | Status: DC

## 2017-01-23 MED ORDER — ALBUMIN HUMAN 25 % IV SOLN: 50.0000 g | Freq: Once | INTRAVENOUS | Status: DC

## 2017-01-23 NOTE — Progress Notes (Signed)
Wyline Mood MD, MRCP(U.K) [address]  [address]  [place], [place] [address]  Main: [phone]  Fax: [phone]   Primary Care Physician: Dorcas Carrow, DO  Primary Gastroenterologist:  Dr. Wyline Mood   Chief Complaint  Patient presents with  . Cirrhosis  . Bloated    HPI: Horatio Bertz is a 61 y.o. male   Summary of history : The patient is here today for a hospital follow up.  He was seen by Dr. Allegra Lai been admitted on 12/17/2016 with abdominal distention jaundice which had worsened over the past 2-3 weeks.  At that point of time he also had some diarrhea and nonbloody emesis.  He was actively consuming alcohol in the last drink was prior to hospital admission.  On admission his liver function tests were elevated with a total bilirubin of 11 ultrasound Doppler was negative for portal vein thrombosis, he had an abdominal paracentesis and the fluid tested with negative for spontaneous bacterial peritonitis.  He had been on Aldactone as an outpatient.  He has been treated for alcoholic hepatitis in 10/23/2015 with steroids.  He was treated for his cirrhosis low-sodium diet commenced on Lasix 20 mg and Aldactone 50 mg with plan for endoscopy as an outpatient to screen for varices.  During his hospitalization he had a fall in the bathroom and had a CT scan of the head which was negative.  On 12/18/2016 his total bilirubin had trended down to 8.9 with an albumin of 2.  Myoglobin of 11.2 g and a platelet count of 139 and a creatinine of 0.53 acetic fluid cultures were negative for any growth   Interval history 12/18/2016 to 01/23/2017  Labs 12/18/2016 AFP 7.3 cellular plasma and 31 smooth muscle antibody 17 ANA negative ANA negative B12 normal folate normal ferritin elevated. SAAG was suggestive of portal hypertension.  INR 1.09.  The bilirubin was 5.7 with a direct component.  TSH 1.49 on 01/18/2017 CMP on 01/18/2017 with a creatinine of 0.66 albumin of 2.6 alkaline phosphatase  of 247 total bilirubin down at 2.4   Since discharge feels bloated- not taking any lactulose. Since discharge has been constipated, last bowel movement was 7 days back. Last drink of alcohol was last night . Drinking alcohol for the past 30 years, 3-4 drinks a day -spirits. Does smoke 1.5 packs a day .    Current Outpatient Medications  Medication Sig Dispense Refill  . ADVAIR HFA 115-21 MCG/ACT inhaler Inhale 2 puffs into the lungs 2 (two) times daily.     Marland Kitchen albuterol (PROVENTIL HFA;VENTOLIN HFA) 108 (90 Base) MCG/ACT inhaler Inhale 2 puffs into the lungs every 6 (six) hours as needed for wheezing or shortness of breath. 1 Inhaler 12  . albuterol (PROVENTIL) (2.5 MG/3ML) 0.083% nebulizer solution Take 3 mLs (2.5 mg total) by nebulization every 4 (four) hours as needed for wheezing or shortness of breath. 50 mL 3  . albuterol (VENTOLIN HFA) 108 (90 Base) MCG/ACT inhaler Ventolin HFA 90 mcg/actuation aerosol inhaler    . Cetirizine HCl (ZYRTEC ALLERGY) 10 MG CAPS Take 10 mg by mouth daily.    Marland Kitchen escitalopram (LEXAPRO) 10 MG tablet Take 1 tablet (10 mg total) by mouth daily. 90 tablet 1  . esomeprazole (NEXIUM) 20 MG capsule Take 20 mg by mouth daily at 12 noon.    . feeding supplement, ENSURE ENLIVE, (ENSURE ENLIVE) LIQD Take 237 mLs by mouth 3 (three) times daily between meals. 237 mL 12  . fluticasone (FLONASE) 50 MCG/ACT  nasal spray Place 1 spray into both nostrils daily.     . furosemide (LASIX) 20 MG tablet Take 1 tablet (20 mg total) daily by mouth. 90 tablet 1  . HUMIRA PEN 40 MG/0.8ML PNKT     . lactulose (CHRONULAC) 10 GM/15ML solution TAKE 45 MILLILITERS BY MOUTH 2 (TWO) TIMES DAILY.  0  . loperamide (IMODIUM) 2 MG capsule Take 1 capsule (2 mg total) by mouth every 6 (six) hours as needed for diarrhea or loose stools. 30 capsule 0  . LYRICA 75 MG capsule TAKE 1 CAPSULE BY MOUTH 2 TIMES DAILY  4  . metoprolol succinate (TOPROL-XL) 25 MG 24 hr tablet Take by mouth.    . mirtazapine  (REMERON) 30 MG tablet Take 1 tablet (30 mg total) at bedtime by mouth. 90 tablet 1  . mometasone (ELOCON) 0.1 % ointment Apply topically.    . Multiple Vitamin (MULTIVITAMIN) tablet Take 1 tablet by mouth daily.    Marland Kitchen. NICOTINE STEP 1 21 MG/24HR patch PLACE 1 PATCH (21 MG TOTAL) ONTO THE SKIN DAILY.  0  . nystatin (MYCOSTATIN) 100000 UNIT/ML suspension Take 5 mLs by mouth 2 (two) times daily. Uses with his inhalers  13  . ondansetron (ZOFRAN ODT) 4 MG disintegrating tablet Take 1 tablet (4 mg total) by mouth every 8 (eight) hours as needed for nausea or vomiting. 20 tablet 1  . predniSONE (DELTASONE) 10 MG tablet     . pregabalin (LYRICA) 150 MG capsule Take 1 capsule (150 mg total) 3 (three) times daily by mouth. 270 capsule 1  . rifaximin (XIFAXAN) 550 MG TABS tablet Take by mouth.    . Roflumilast (DALIRESP) 250 MCG TABS Take by mouth.    . spironolactone (ALDACTONE) 50 MG tablet Take 1 tablet (50 mg total) daily by mouth. 90 tablet 1  . SPIRONOLACTONE PO Take by mouth.    . theophylline (THEODUR) 300 MG 12 hr tablet Take 1 tablet (300 mg total) 2 (two) times daily by mouth. 60 tablet 11  . tiotropium (SPIRIVA HANDIHALER) 18 MCG inhalation capsule Place 1 capsule (18 mcg total) into inhaler and inhale daily. 30 capsule 0  . traMADol (ULTRAM) 50 MG tablet Take 1 tablet (50 mg total) 2 (two) times daily as needed by mouth. 56 tablet 0   No current facility-administered medications for this visit.     Allergies as of 01/23/2017  . (No Known Allergies)    ROS:  General: Negative for anorexia, weight loss, fever, chills, fatigue, weakness. ENT: Negative for hoarseness, difficulty swallowing , nasal congestion. CV: Negative for chest pain, angina, palpitations, dyspnea on exertion, peripheral edema.  Respiratory: Negative for dyspnea at rest, dyspnea on exertion, cough, sputum, wheezing.  GI: See history of present illness. GU:  Negative for dysuria, hematuria, urinary incontinence, urinary  frequency, nocturnal urination.  Endo: Negative for unusual weight change.    Physical Examination:   BP 108/70 (BP Location: Left Arm, Patient Position: Sitting, Cuff Size: Normal)   Pulse (!) 109   Temp 97.6 F (36.4 C) (Oral)   Ht 5\' 10"  (1.778 m)   Wt 168 lb 12.8 oz (76.6 kg)   BMI 24.22 kg/m   General: appears manuouished , spider angiomas over chest  Eyes: No icterus. Conjunctivae pink. Mouth: Oropharyngeal mucosa moist and pink , no lesions erythema or exudate. Lungs: Clear to auscultation bilaterally. Non-labored. Heart: Regular rate and rhythm, no murmurs rubs or gallops.  Abdomen: Bowel sounds are normal, distended, tense ascites, bs+, no tenderness  guarding or rigidity  Extremities: 2+ pitting pedal edema.  Neuro: Alert and oriented x 3.  Grossly intact. Skin: Warm and dry, no jaundice.   Psych: Alert and cooperative, normal mood and affect.   Imaging Studies: No results found.  Assessment and Plan:   Jules HusbandsDavid Thurman is a 61 y.o. y/o male here to follow up for alcoholic liver cirrhosis (decompensated) , with ascites, no encephalopathy and active alcohol consumption. Explained very high mortality with this condition unless he stops all alcohol.   Plan 1.  Diagnostic and therapeutic abdominal paracentesis a cut out much fluid as possible if taking on more than 5 L please administer 50 g of albumin check fluid for cell count and cell culture to rule out SBP 2. low-salt diet with salt restriction to less than 2 g/day will refer to a dietitian to help him to better 3.  Increase Aldactone 100 mg and Lasix to 40 mg check daily weights at home call my office ASAP if gaining weight otherwise check a CMP in 5-7 days time 4.  EGD to screen for esophageal varices will be discussed at next visit once ascites is better controlled.   5. Check hepatitis C and B serologies and will advise on vaccinations subsequently 6.  Stop all alcohol consumption and needs to attend alcohol detox  sessions to even consider a liver transplant if he were to require it down the road 7.  Ensure 3 times daily 8. Lactulose titrated to two soft bowel movements a day  9. Stop smoking  10. He will see me back in 2 weeks to decide if he can stop alcohol   Dr Wyline MoodKiran Joshva Labreck  MD,MRCP [facility](U[facility]) Follow up in 2 weeks.

## 2017-01-23 NOTE — Procedures (Signed)
Paracentesis. Amount pending EBL 0 Comp 0 

## 2017-01-24 LAB — HEPATITIS A ANTIBODY, TOTAL: Hep A Total Ab: NEGATIVE

## 2017-01-27 LAB — BODY FLUID CULTURE
Culture: NO GROWTH
GRAM STAIN: NONE SEEN

## 2017-01-28 ENCOUNTER — Other Ambulatory Visit: Payer: Self-pay

## 2017-01-28 ENCOUNTER — Telehealth: Payer: Self-pay | Admitting: Gastroenterology

## 2017-01-28 DIAGNOSIS — K7031 Alcoholic cirrhosis of liver with ascites: Secondary | ICD-10-CM

## 2017-01-28 MED ORDER — SPIRONOLACTONE 100 MG PO TABS: 100.0000 mg | ORAL_TABLET | Freq: Every day | ORAL | 2 refills | Status: DC

## 2017-01-28 MED ORDER — FUROSEMIDE 40 MG PO TABS: 40.0000 mg | ORAL_TABLET | Freq: Every day | ORAL | 1 refills | Status: DC

## 2017-01-28 NOTE — Telephone Encounter (Signed)
Advised patient medications have been resent [facility] [place].  Received phone message:  Returning patient wife call from last week. Patient states all medication were sent to wrong pharmacy and would [facility] [place].

## 2017-01-28 NOTE — Telephone Encounter (Signed)
Returning patient wife call from last week. Patient states all medication were sent to wrong pharmacy and would [facility] [place].

## 2017-01-29 ENCOUNTER — Telehealth: Payer: Self-pay | Admitting: Gastroenterology

## 2017-01-29 ENCOUNTER — Telehealth: Payer: Self-pay | Admitting: Family Medicine

## 2017-01-29 ENCOUNTER — Other Ambulatory Visit: Payer: Self-pay

## 2017-01-29 DIAGNOSIS — Z598 Other problems related to housing and economic circumstances: Secondary | ICD-10-CM

## 2017-01-29 DIAGNOSIS — E43 Unspecified severe protein-calorie malnutrition: Secondary | ICD-10-CM

## 2017-01-29 DIAGNOSIS — J9621 Acute and chronic respiratory failure with hypoxia: Secondary | ICD-10-CM

## 2017-01-29 DIAGNOSIS — K7031 Alcoholic cirrhosis of liver with ascites: Secondary | ICD-10-CM

## 2017-01-29 DIAGNOSIS — Z599 Problem related to housing and economic circumstances, unspecified: Secondary | ICD-10-CM

## 2017-01-29 DIAGNOSIS — J9622 Acute and chronic respiratory failure with hypercapnia: Secondary | ICD-10-CM

## 2017-01-29 DIAGNOSIS — K7011 Alcoholic hepatitis with ascites: Secondary | ICD-10-CM

## 2017-01-29 MED ORDER — LACTULOSE 10 GM/15ML PO SOLN: 10.0000 g | Freq: Every day | ORAL | 0 refills | Status: DC

## 2017-01-29 NOTE — Telephone Encounter (Signed)
Can we find out if his wife was there- she was the one who needed help

## 2017-01-29 NOTE — Telephone Encounter (Signed)
Yes, wife was there, it is because of financial reasons.

## 2017-01-29 NOTE — Telephone Encounter (Addendum)
Referral to [facility] generated to see what they can do

## 2017-01-29 NOTE — Telephone Encounter (Signed)
*  STAT* If patient is at the pharmacy, call can be transferred to refill team.   1. Which medications need to be refilled? (please list name of each medication and dose if known) Tuculose (laxitive)  2. Which pharmacy/location (including street and city if local pharmacy) is medication to be sent to? [facility] [place]   3. Do they need a 30 day or 90 day supply? 90 day

## 2017-01-29 NOTE — Telephone Encounter (Signed)
Copied from CRM [phone]. Topic: Quick Communication - See Telephone Encounter >> Jan 29, 2017 10:11 AM Cipriano BunkerLambe, Annette S wrote: CRM for notification. See Telephone encounter for:  Angelique BlonderDenise - [phone] [facility], said went to see pt. And did assessment,  Patient refused help at this time.  He is thinking about it.  01/29/17.

## 2017-01-31 ENCOUNTER — Telehealth: Payer: Self-pay

## 2017-01-31 NOTE — Telephone Encounter (Signed)
Pt contacted office and would like to have his stomach drained again. He said its getting full.  Please advise.  Thanks [employer]

## 2017-01-31 NOTE — Telephone Encounter (Signed)
At his last office visit was supposed to get BMP in a week from visit- has he got it done? He was supposed to maintain daily weights and call if gaining weight - has he done that  Has he stopped alcohol  He can have a paracentesis- if > 5 litres taken out then give 50 grams of albumin  He needs to follow up with me as scheduled

## 2017-02-01 ENCOUNTER — Telehealth: Payer: Self-pay

## 2017-02-01 DIAGNOSIS — K7031 Alcoholic cirrhosis of liver with ascites: Secondary | ICD-10-CM

## 2017-02-01 NOTE — Telephone Encounter (Signed)
LVM for patient callback. Reminded patient of labs to be drawn. Order at lab.  Msg per Dr. Tobi BastosAnna: At his last office visit was supposed to get BMP in a week from visit- has he got it done? He was supposed to maintain daily weights and call if gaining weight - has he done that  Has he stopped alcohol  He can have a paracentesis- if > 5 litres taken out then give 50 grams of albumin  He needs to follow up with me as scheduled

## 2017-02-04 ENCOUNTER — Telehealth: Payer: Self-pay | Admitting: Family Medicine

## 2017-02-04 NOTE — Telephone Encounter (Signed)
Noted  

## 2017-02-04 NOTE — Telephone Encounter (Signed)
Copied from CRM [phone]#[id number]. Topic: General - Other >> Feb 01, 2017  4:44 PM Windy KalataMichael, Taylor L, NT wrote: Reason for CRM: emily from [facility] wanted to let doctor know pt missed both home health pt visits. He was difficult to reach and when she reached him he was wanting to get in with the GI to get fluid off of him and she states he was going to call her back and she never heard back nor can she get in touch with him.   Irving Burtonmily [phone]

## 2017-02-06 ENCOUNTER — Other Ambulatory Visit: Payer: Self-pay

## 2017-02-06 ENCOUNTER — Telehealth: Payer: Self-pay

## 2017-02-06 ENCOUNTER — Other Ambulatory Visit
Admission: EM | Admit: 2017-02-06 | Discharge: 2017-02-06 | Disposition: A | Discharge status: Home / Self Care | Payer: BLUE CROSS/BLUE SHIELD | Source: Ambulatory Visit | Attending: Family Medicine | Admitting: Family Medicine

## 2017-02-06 DIAGNOSIS — R188 Other ascites: Secondary | ICD-10-CM | POA: Diagnosis not present

## 2017-02-06 DIAGNOSIS — K7460 Unspecified cirrhosis of liver: Secondary | ICD-10-CM | POA: Insufficient documentation

## 2017-02-06 DIAGNOSIS — K7031 Alcoholic cirrhosis of liver with ascites: Secondary | ICD-10-CM

## 2017-02-06 LAB — BASIC METABOLIC PANEL
Anion gap: 13 (ref 5–15)
BUN: 5 mg/dL — AB (ref 6–20)
CHLORIDE: 92 mmol/L — AB (ref 101–111)
CO2: 30 mmol/L (ref 22–32)
Calcium: 8.6 mg/dL — ABNORMAL LOW (ref 8.9–10.3)
Creatinine, Ser: 0.62 mg/dL (ref 0.61–1.24)
GFR calc non Af Amer: >60 mL/min (ref >60)
Glucose, Bld: 90 mg/dL (ref 65–99)
POTASSIUM: 3.5 mmol/L (ref 3.5–5.1)
SODIUM: 135 mmol/L (ref 135–145)

## 2017-02-06 NOTE — Telephone Encounter (Signed)
Clarified instructions and process for spouse concerning patient labs and ordering paracentesis.    Patient to have labs completed today (he got confused and hasn't had them done).  After lab results and callback, we'll schedule paracentesis.   The goal is to have paracentesis completed prior to this weekend.

## 2017-02-07 ENCOUNTER — Telehealth: Payer: Self-pay

## 2017-02-07 ENCOUNTER — Other Ambulatory Visit: Payer: Self-pay

## 2017-02-07 DIAGNOSIS — K7031 Alcoholic cirrhosis of liver with ascites: Secondary | ICD-10-CM

## 2017-02-07 MED ORDER — ALBUMIN HUMAN 25 % IV SOLN: 50.0000 g | Freq: Once | INTRAVENOUS | Status: DC

## 2017-02-07 NOTE — Telephone Encounter (Signed)
Advised patient's spouse that paracentesis has been ordered. They should receive a phone call from [facility].   Advised Hep A vaccine requested per Dr. Tobi BastosAnna.   Spouse states patient can have vaccine completed on 12/17 @ PCP office.

## 2017-02-07 NOTE — Progress Notes (Signed)
[facility]   Message forwarded to Dr. Tobi BastosAnna concerning pt's lab results.

## 2017-02-07 NOTE — Telephone Encounter (Signed)
-----   Message from Wyline MoodKiran Anna, MD sent at 02/05/2017  9:32 PM EST ----- Needs hep A vaccine

## 2017-02-08 ENCOUNTER — Other Ambulatory Visit: Payer: Self-pay

## 2017-02-08 MED ORDER — LACTULOSE 10 GM/15ML PO SOLN: 10.0000 g | Freq: Every day | ORAL | 0 refills | Status: DC

## 2017-02-12 ENCOUNTER — Ambulatory Visit: Admission: RE | Admit: 2017-02-12 | Payer: BLUE CROSS/BLUE SHIELD | Source: Ambulatory Visit

## 2017-02-15 ENCOUNTER — Ambulatory Visit
Admission: RE | Admit: 2017-02-15 | Discharge: 2017-02-15 | Disposition: A | Discharge status: Home / Self Care | Payer: BLUE CROSS/BLUE SHIELD | Source: Ambulatory Visit | Attending: Gastroenterology | Admitting: Gastroenterology

## 2017-02-15 DIAGNOSIS — K7031 Alcoholic cirrhosis of liver with ascites: Secondary | ICD-10-CM | POA: Insufficient documentation

## 2017-02-15 NOTE — Procedures (Signed)
PROCEDURE SUMMARY:  Successful US guided paracentesis from right lateral abdomen.  Yielded 1.6 liters of clear yellow fluid.  No immediate complications.  Pt tolerated well.   Tyja Gortney S Carmichael Burdette PA-C 02/15/2017 10:35 AM

## 2017-02-18 ENCOUNTER — Encounter: Payer: Self-pay | Admitting: Gastroenterology

## 2017-02-18 ENCOUNTER — Ambulatory Visit: Payer: BLUE CROSS/BLUE SHIELD | Admitting: Family Medicine

## 2017-02-18 ENCOUNTER — Other Ambulatory Visit: Payer: Self-pay

## 2017-02-18 ENCOUNTER — Encounter: Payer: Self-pay | Admitting: Family Medicine

## 2017-02-18 ENCOUNTER — Ambulatory Visit: Payer: BLUE CROSS/BLUE SHIELD | Admitting: Gastroenterology

## 2017-02-18 VITALS — BP 100/65 | HR 124 | Wt 150.0 lb

## 2017-02-18 VITALS — BP 105/61 | HR 108 | Temp 97.9°F | Ht 70.0 in | Wt 150.4 lb

## 2017-02-18 DIAGNOSIS — Z7189 Other specified counseling: Secondary | ICD-10-CM | POA: Diagnosis not present

## 2017-02-18 DIAGNOSIS — F10239 Alcohol dependence with withdrawal, unspecified: Secondary | ICD-10-CM

## 2017-02-18 DIAGNOSIS — K7031 Alcoholic cirrhosis of liver with ascites: Secondary | ICD-10-CM

## 2017-02-18 DIAGNOSIS — G629 Polyneuropathy, unspecified: Secondary | ICD-10-CM | POA: Diagnosis not present

## 2017-02-18 DIAGNOSIS — G621 Alcoholic polyneuropathy: Secondary | ICD-10-CM | POA: Diagnosis not present

## 2017-02-18 MED ORDER — LACTULOSE 10 GM/15ML PO SOLN: 10.0000 g | Freq: Three times a day (TID) | ORAL | 0 refills | Status: DC

## 2017-02-18 NOTE — Assessment & Plan Note (Signed)
Stable on current regimen. Continue current regimen. Continue to monitor. Call with any concerns.  

## 2017-02-18 NOTE — Assessment & Plan Note (Signed)
Still not sure if he wants to quit drinking. Wants to get through the holidays and then decide. Will discuss with GI regarding whether he would be a candidate for naltrexone with his liver functions. Will call with any concerns.

## 2017-02-18 NOTE — Progress Notes (Signed)
BP 100/65 (BP Location: Left Arm, Patient Position: Sitting, Cuff Size: Normal)   Pulse (!) 124   Wt 150 lb (68 kg)   SpO2 91%   BMI 21.52 kg/m    Subjective:    Patient ID: Devin Bryant, male    DOB: Mar 28, 1955, 61 y.o.   MRN: [id number]  HPI: Devin Bryant is a 61 y.o. male  Chief Complaint  Patient presents with  . Peripheral Neuropathy   Had Hospice come out, but as he is not medicare, they won't pay for it. Unclear if he would qualify for medicaid. Referral to [facility] has been generated, but he has not heard anything yet. He's not sure he's ready for this yet. Still wants to be a full code. Has a living will and HCPA on file at the hospital.   Saw GI today- worsening. Still drinking. They increased his diuretics today. Referred him to a dietician to help with salt restriction. Would like to do EGD, but [name] is not sure. To have CMP in 5-7 days. Needs to discuss Advanced Directives.  Saw Pulmonology today. Note not yet available. Has been taken off theophyline. Will await their note.   NEUROPATHY Neuropathy status: stable  Satisfied with current treatment?: yes Medication side effects: no Medication compliance:  excellent compliance Location: bilateral feet and legs Pain: yes Severity: moderate  Quality: sharp, pins and needles Frequency: constant Bilateral: yes Symmetric: yes Numbness: yes Decreased sensation: yes Weakness: yes Context: stable  Relevant past medical, surgical, family and social history reviewed and updated as indicated. Interim medical history since our last visit reviewed. Allergies and medications reviewed and updated.  Review of Systems  Constitutional: Negative.   Respiratory: Negative.   Cardiovascular: Negative.   Musculoskeletal: Negative.   Skin: Negative.   Neurological: Positive for numbness. Negative for dizziness, tremors, seizures, syncope, facial asymmetry, speech difficulty, weakness, light-headedness and headaches.    Hematological: Negative.   Psychiatric/Behavioral: Negative.     Per HPI unless specifically indicated above     Objective:    BP 100/65 (BP Location: Left Arm, Patient Position: Sitting, Cuff Size: Normal)   Pulse (!) 124   Wt 150 lb (68 kg)   SpO2 91%   BMI 21.52 kg/m   Wt Readings from Last 3 Encounters:  02/18/17 150 lb (68 kg)  02/18/17 150 lb 6.4 oz (68.2 kg)  01/23/17 168 lb 12.8 oz (76.6 kg)    Physical Exam  Constitutional: He is oriented to person, place, and time. He appears well-developed and well-nourished. No distress.  HENT:  Head: Normocephalic and atraumatic.  Right Ear: Hearing normal.  Left Ear: Hearing normal.  Nose: Nose normal.  Eyes: Conjunctivae and lids are normal. Right eye exhibits no discharge. Left eye exhibits no discharge. No scleral icterus.  Cardiovascular: Normal rate, normal heart sounds and intact distal pulses. Exam reveals no gallop and no friction rub.  No murmur heard. Pulmonary/Chest: Effort normal and breath sounds normal. No respiratory distress. He has no wheezes. He has no rales. He exhibits no tenderness.  Abdominal: Soft. Bowel sounds are normal. He exhibits no distension and no mass. There is no tenderness. There is no rebound and no guarding.  Musculoskeletal: Normal range of motion.  Neurological: He is alert and oriented to person, place, and time.  Skin: Skin is warm, dry and intact. No rash noted. He is not diaphoretic. No erythema. No pallor.  Psychiatric: He has a normal mood and affect. His speech is normal and behavior is normal.  Judgment and thought content normal. Cognition and memory are normal.  Nursing note and vitals reviewed.   Results for orders placed or performed during the hospital encounter of 02/06/17  Basic metabolic panel  Result Value Ref Range   Sodium 135 135 - 145 mmol/L   Potassium 3.5 3.5 - 5.1 mmol/L   Chloride 92 (L) 101 - 111 mmol/L   CO2 30 22 - 32 mmol/L   Glucose, Bld 90 65 - 99 mg/dL    BUN 5 (L) 6 - 20 mg/dL   Creatinine, Ser 4.090.62 0.61 - 1.24 mg/dL   Calcium 8.6 (L) 8.9 - 10.3 mg/dL   GFR calc non Af Amer >60 >60 mL/min   GFR calc Af Amer >60 >60 mL/min   Anion gap 13 5 - 15      Assessment & Plan:   Problem List Items Addressed This Visit      Nervous and Auditory   Alcohol dependence with withdrawal with complication (HCC)    Still not sure if he wants to quit drinking. Wants to get through the holidays and then decide. Will discuss with GI regarding whether he would be a candidate for naltrexone with his liver functions. Will call with any concerns.       Neuropathy    Stable on current regimen. Continue current regimen. Continue to monitor. Call with any concerns.       Alcohol-induced polyneuropathy (HCC) - Primary    Stable on current regimen. Continue current regimen. Continue to monitor. Call with any concerns.         Other   Advance directive discussed with patient    Has living will on file with the hospital. Will bring in a copy for us to scan into his chart. Would still like to be full code. Information given to patients today.          Follow up plan: Return in about 4 weeks (around 03/18/2017).

## 2017-02-18 NOTE — Progress Notes (Signed)
Wyline Mood MD, MRCP(U.K) [address]  [address]  [place], [place] [address]  Main: [phone]  Fax: [phone]   Primary Care Physician: Dorcas Carrow, DO  Primary Gastroenterologist:  Dr. Wyline Mood   No chief complaint on file.   HPI: Devin Bryant is a 61 y.o. male    Summary of history : The patient is here today for a hospital follow up.  He was seen by Dr. Allegra Lai been admitted on 12/17/2016 with abdominal distention jaundice which had worsened over the past 2-3 weeks.  At that point of time he also had some diarrhea and nonbloody emesis.  He was actively consuming alcohol in the last drink was prior to hospital admission.  On admission his liver function tests were elevated with a total bilirubin of 11 ultrasound Doppler was negative for portal vein thrombosis, he had an abdominal paracentesis and the fluid tested with negative for spontaneous bacterial peritonitis.  He had been on Aldactone as an outpatient.  He has been treated for alcoholic hepatitis in 10/23/2015 with steroids.  He was treated for his cirrhosis low-sodium diet commenced on Lasix 20 mg and Aldactone 50 mg with plan for endoscopy as an outpatient to screen for varices.  During his hospitalization he had a fall in the bathroom and had a CT scan of the head which was negative.  On 12/18/2016 his total bilirubin had trended down to 8.9 with an albumin of 2.  Hb of 11.2 g and a platelet count of 139 and a creatinine of 0.53 acetic fluid cultures were negative for any growth  Labs 12/18/2016 AFP 7.3 , 31 smooth muscle antibody 17 ANA negative ,B12 normal folate normal ferritin elevated. SAAG was suggestive of portal hypertension.  INR 1.09.  The bilirubin was 5.7 with a direct component.  TSH 1.49 on 01/18/2017 CMP on 01/18/2017 with a creatinine of 0.66 albumin of 2.6 alkaline phosphatase of 247 total bilirubin down at 2.4.   Interval history 01/23/2017-02/18/17  Underwent paracentesis and had 1.6  litres of fluid taken out  On 02/15/17 . Still drinking alcohol, last drink was yesterday night . On lasix 40 mg and aldactone 50 mg .Has cut down the salt in his diet . Not checking daily weights.    On lactulose - doing ok . No confusion. Some issues with memory. Having 2-3 bowel movements a day . Per daughter memory at baseline.   Current Outpatient Medications  Medication Sig Dispense Refill  . ADVAIR HFA 115-21 MCG/ACT inhaler Inhale 2 puffs into the lungs 2 (two) times daily.     Marland Kitchen albuterol (PROVENTIL HFA;VENTOLIN HFA) 108 (90 Base) MCG/ACT inhaler Inhale 2 puffs into the lungs every 6 (six) hours as needed for wheezing or shortness of breath. 1 Inhaler 12  . albuterol (PROVENTIL) (2.5 MG/3ML) 0.083% nebulizer solution Take 3 mLs (2.5 mg total) by nebulization every 4 (four) hours as needed for wheezing or shortness of breath. 50 mL 3  . albuterol (VENTOLIN HFA) 108 (90 Base) MCG/ACT inhaler Ventolin HFA 90 mcg/actuation aerosol inhaler    . Cetirizine HCl (ZYRTEC ALLERGY) 10 MG CAPS Take 10 mg by mouth daily.    Marland Kitchen escitalopram (LEXAPRO) 10 MG tablet Take 1 tablet (10 mg total) by mouth daily. 90 tablet 1  . esomeprazole (NEXIUM) 20 MG capsule Take 20 mg by mouth daily at 12 noon.    . feeding supplement, ENSURE ENLIVE, (ENSURE ENLIVE) LIQD Take 237 mLs by mouth 3 (three) times daily between meals.  237 mL 12  . fluticasone (FLONASE) 50 MCG/ACT nasal spray Place 1 spray into both nostrils daily.     . furosemide (LASIX) 40 MG tablet Take 1 tablet (40 mg total) by mouth daily. 30 tablet 1  . HUMIRA PEN 40 MG/0.8ML PNKT     . lactulose (CHRONULAC) 10 GM/15ML solution Take 15 mLs (10 g total) by mouth 3 (three) times daily. 240 mL 0  . loperamide (IMODIUM) 2 MG capsule Take 1 capsule (2 mg total) by mouth every 6 (six) hours as needed for diarrhea or loose stools. 30 capsule 0  . LYRICA 75 MG capsule TAKE 1 CAPSULE BY MOUTH 2 TIMES DAILY  4  . metoprolol succinate (TOPROL-XL) 25 MG 24 hr  tablet Take by mouth.    . mirtazapine (REMERON) 30 MG tablet Take 1 tablet (30 mg total) at bedtime by mouth. 90 tablet 1  . mometasone (ELOCON) 0.1 % ointment Apply topically.    . Multiple Vitamin (MULTIVITAMIN) tablet Take 1 tablet by mouth daily.    Marland Kitchen. NICOTINE STEP 1 21 MG/24HR patch PLACE 1 PATCH (21 MG TOTAL) ONTO THE SKIN DAILY.  0  . nystatin (MYCOSTATIN) 100000 UNIT/ML suspension Take 5 mLs by mouth 2 (two) times daily. Uses with his inhalers  13  . ondansetron (ZOFRAN ODT) 4 MG disintegrating tablet Take 1 tablet (4 mg total) by mouth every 8 (eight) hours as needed for nausea or vomiting. 20 tablet 1  . predniSONE (DELTASONE) 10 MG tablet     . pregabalin (LYRICA) 150 MG capsule Take 1 capsule (150 mg total) 3 (three) times daily by mouth. 270 capsule 1  . rifaximin (XIFAXAN) 550 MG TABS tablet Take by mouth.    . Roflumilast (DALIRESP) 250 MCG TABS Take by mouth.    . spironolactone (ALDACTONE) 100 MG tablet Take 1 tablet (100 mg total) by mouth daily. 60 tablet 2  . theophylline (THEODUR) 300 MG 12 hr tablet Take 1 tablet (300 mg total) 2 (two) times daily by mouth. 60 tablet 11  . tiotropium (SPIRIVA HANDIHALER) 18 MCG inhalation capsule Place 1 capsule (18 mcg total) into inhaler and inhale daily. 30 capsule 0  . traMADol (ULTRAM) 50 MG tablet Take 1 tablet (50 mg total) 2 (two) times daily as needed by mouth. 56 tablet 0   Current Facility-Administered Medications  Medication Dose Route Frequency Provider Last Rate Last Dose  . albumin human 25 % solution 50 g  50 g Intravenous Once Wyline MoodAnna, Bertrum Helmstetter, MD      . albumin human 25 % solution 50 g  50 g Intravenous Once Wyline MoodAnna, [place], MD        Allergies as of 02/18/2017  . (No Known Allergies)    ROS:  General: Negative for anorexia, weight loss, fever, chills, fatigue, weakness. ENT: Negative for hoarseness, difficulty swallowing , nasal congestion. CV: Negative for chest pain, angina, palpitations, dyspnea on exertion,  peripheral edema.  Respiratory: Negative for dyspnea at rest, dyspnea on exertion, cough, sputum, wheezing.  GI: See history of present illness. GU:  Negative for dysuria, hematuria, urinary incontinence, urinary frequency, nocturnal urination.  Endo: Negative for unusual weight change.    Physical Examination:   There were no vitals taken for this visit.  General: Well-nourished, well-developed in no acute distress.  Eyes: No icterus. Conjunctivae pink. Mouth: Oropharyngeal mucosa moist and pink , no lesions erythema or exudate. Lungs: Clear to auscultation bilaterally. Non-labored. Heart: Regular rate and rhythm, no murmurs rubs or gallops.  Abdomen: Bowel sounds are normal, nontender, nondistended, no hepatosplenomegaly or masses, no abdominal bruits or hernia , no rebound or guarding.   Extremities: No lower extremity edema. No clubbing or deformities. Neuro: Alert and oriented x 3.  Grossly intact. Skin: Warm and dry, no jaundice.   Psych: Alert and cooperative, normal mood and affect.   Imaging Studies: [place] Paracentesis  Result Date: 02/15/2017 INDICATION: Hepatic cirrhosis and ascites.  Request for paracentesis. EXAM: ULTRASOUND GUIDED RIGHT LATERAL ABDOMEN PARACENTESIS MEDICATIONS: 1% Lidocaine = 5 mL COMPLICATIONS: None immediate. PROCEDURE: Informed written consent was obtained from the patient after a discussion of the risks, benefits and alternatives to treatment. A timeout was performed prior to the initiation of the procedure. Initial ultrasound scanning demonstrates a small amount of ascites within the right lower abdominal quadrant. The right lower abdomen was prepped and draped in the usual sterile fashion. 1% lidocaine was used for local anesthesia. Following this, a 6 Fr Safe-T-Centesis catheter was introduced. An ultrasound image was saved for documentation purposes. The paracentesis was performed. The catheter was removed and a dressing was applied. The patient tolerated  the procedure well without immediate post procedural complication. FINDINGS: A total of approximately 1.6 liters of clear yellow fluid was removed. IMPRESSION: Successful ultrasound-guided paracentesis yielding 1.6 liters of peritoneal fluid. Read by:  Corrin ParkerWendy Blair, PA-C Electronically Signed   By: Irish LackGlenn  Yamagata M.D.   On: 02/15/2017 10:34   [place] Paracentesis  Result Date: 01/23/2017 INDICATION: Ascites EXAM: ULTRASOUND GUIDED  PARACENTESIS MEDICATIONS: None. COMPLICATIONS: None immediate. PROCEDURE: Informed written consent was obtained from the patient after a discussion of the risks, benefits and alternatives to treatment. A timeout was performed prior to the initiation of the procedure. Initial ultrasound scanning demonstrates a large amount of ascites within the right lower abdominal quadrant. The right lower abdomen was prepped and draped in the usual sterile fashion. 1% lidocaine with epinephrine was used for local anesthesia. Following this, a Safe-T-Centesis catheter was introduced. An ultrasound image was saved for documentation purposes. The paracentesis was performed. The catheter was removed and a dressing was applied. The patient tolerated the procedure well without immediate post procedural complication. FINDINGS: A total of approximately 3.3 L of clear yellow fluid was removed. IMPRESSION: Successful ultrasound-guided paracentesis yielding 3.3 liters of peritoneal fluid. Electronically Signed   By: Jolaine ClickArthur  Hoss M.D.   On: 01/23/2017 14:02    Assessment and Plan:   Devin Bryant is a 61 y.o. y/o male here to follow up for alcoholic liver cirrhosis (decompensated) , with ascites, no encephalopathy and active alcohol consumption. Explained very high mortality with this condition unless he stops all alcohol. He has not yet decided if he wishes to stop consuming alcohol.   Plan 1. low-salt diet with salt restriction to less than 2 g/day will refer to a dietitian to help him to better 2.  Stop smoking (still smoking ) 3.  Increase Aldactone 100 mg and continue Lasix to 40 mg check daily weights at home call my office ASAP if gaining weight otherwise check a CMP in 5-7 days time 4.  EGD to screen for esophageal varices .  He would like to think about it. Presently not keen , aware risks of death from an esophageal variceal bleed  5. Check hepatitis C serology , Hep A serology. Check CMP,INR 6.  Stop all alcohol consumption and needs to attend alcohol detox sessions to even consider a liver transplant if he were to require it down the road 7.  Ensure 3 times daily 8. Lactulose titrated to two soft bowel movements a day  9. Advised to discuss with PCP regarding living will , DNR status and end of life goals of care.   Dr Wyline Mood  MD,MRCP [facility]) Follow up in 4 weeks

## 2017-02-18 NOTE — Addendum Note (Signed)
Addended by: Ardyth ManARTER, Yoshito Gaza Z on: 02/18/2017 02:24 PM   Modules accepted: Orders

## 2017-02-18 NOTE — Assessment & Plan Note (Signed)
Has living will on file with the hospital. Will bring in a copy for us to scan into his chart. Would still like to be full code. Information given to patients today.

## 2017-02-19 ENCOUNTER — Other Ambulatory Visit: Payer: Self-pay | Admitting: Specialist

## 2017-02-19 DIAGNOSIS — J449 Chronic obstructive pulmonary disease, unspecified: Secondary | ICD-10-CM

## 2017-02-19 DIAGNOSIS — R911 Solitary pulmonary nodule: Secondary | ICD-10-CM

## 2017-02-27 ENCOUNTER — Telehealth: Payer: Self-pay | Admitting: Family Medicine

## 2017-02-27 ENCOUNTER — Other Ambulatory Visit: Payer: Self-pay | Admitting: Gastroenterology

## 2017-02-27 NOTE — Telephone Encounter (Signed)
Copied from CRM [id number]. Topic: Quick Communication - Rx Refill/Question >> Feb 27, 2017  4:02 PM Herby AbrahamJohnson, Shiquita C wrote: Pt's spouse Dois DavenportSandra - [phone]- [phone] called in to request a refill on Tramadol. Spouse says that pt forgot to pick up at apt. Spouse would like to pick up when ready.       Agent: Please be advised that RX refills may take up to 3 business days. We ask that you follow-up with your pharmacy.

## 2017-02-28 ENCOUNTER — Other Ambulatory Visit: Payer: Self-pay | Admitting: Family Medicine

## 2017-02-28 MED ORDER — TRAMADOL HCL 50 MG PO TABS: 50.0000 mg | ORAL_TABLET | Freq: Two times a day (BID) | ORAL | 0 refills | Status: DC | PRN

## 2017-02-28 NOTE — Telephone Encounter (Signed)
RX printed and picked up by patient's wife.

## 2017-03-07 ENCOUNTER — Telehealth: Payer: Self-pay

## 2017-03-07 DIAGNOSIS — M545 Low back pain, unspecified: Secondary | ICD-10-CM

## 2017-03-07 NOTE — Telephone Encounter (Signed)
Copied from CRM [id number]. Topic: General - Other >> Mar 07, 2017  3:09 PM Terisa Starraylor, Brittany L wrote: Reason for CRM:  Pt's wife would like a call back [phone]. Would not give me any other information. She was not very happy as she said she always is connected to the [facility]. I advised her I could take a message and someone would return her call.    Patient's wife is requesting that he get an xray of his lower back, he fell after Thanksgiving and it is not any better, and before he can see a Chiropractor they want an xray done.

## 2017-03-07 NOTE — Telephone Encounter (Signed)
Order is in. Please let me know if they need to speak to me.

## 2017-03-07 NOTE — Telephone Encounter (Signed)
Patients wife notified

## 2017-03-08 ENCOUNTER — Ambulatory Visit: Payer: Self-pay | Admitting: Family Medicine

## 2017-03-14 ENCOUNTER — Other Ambulatory Visit: Payer: Self-pay | Admitting: Family Medicine

## 2017-03-14 MED ORDER — TRAMADOL HCL 50 MG PO TABS: 50.0000 mg | ORAL_TABLET | Freq: Two times a day (BID) | ORAL | 0 refills | Status: DC | PRN

## 2017-03-17 ENCOUNTER — Other Ambulatory Visit: Payer: Self-pay | Admitting: Gastroenterology

## 2017-03-22 ENCOUNTER — Other Ambulatory Visit: Payer: Self-pay

## 2017-03-22 DIAGNOSIS — K7031 Alcoholic cirrhosis of liver with ascites: Secondary | ICD-10-CM

## 2017-03-22 MED ORDER — FUROSEMIDE 40 MG PO TABS: 40.0000 mg | ORAL_TABLET | Freq: Every day | ORAL | 1 refills | Status: DC

## 2017-03-27 ENCOUNTER — Other Ambulatory Visit: Payer: Self-pay

## 2017-03-27 DIAGNOSIS — K7031 Alcoholic cirrhosis of liver with ascites: Secondary | ICD-10-CM

## 2017-03-27 MED ORDER — FUROSEMIDE 40 MG PO TABS: 40.0000 mg | ORAL_TABLET | Freq: Every day | ORAL | 1 refills | Status: DC

## 2017-04-01 ENCOUNTER — Telehealth: Payer: Self-pay | Admitting: Family Medicine

## 2017-04-01 NOTE — Telephone Encounter (Signed)
He's on the 28 day program- is he due?

## 2017-04-01 NOTE — Telephone Encounter (Signed)
It appears it was printed on 03/14/2017 but was not sure when it was picked up because no signature was on file

## 2017-04-01 NOTE — Telephone Encounter (Signed)
Patient called requesting to pick up his script for tramadol 50mg   He would like to pick this up around 1 today  Thanks

## 2017-04-01 NOTE — Telephone Encounter (Signed)
Script was picked up on 04/01/2017.

## 2017-04-04 ENCOUNTER — Other Ambulatory Visit: Payer: Self-pay

## 2017-04-04 DIAGNOSIS — K7031 Alcoholic cirrhosis of liver with ascites: Secondary | ICD-10-CM

## 2017-04-04 MED ORDER — FUROSEMIDE 40 MG PO TABS: 40.0000 mg | ORAL_TABLET | Freq: Every day | ORAL | 1 refills | Status: DC

## 2017-04-09 ENCOUNTER — Other Ambulatory Visit: Payer: Self-pay

## 2017-04-09 MED ORDER — LACTULOSE 10 GM/15ML PO SOLN: 10.0000 g | Freq: Three times a day (TID) | ORAL | 0 refills | Status: AC

## 2017-05-01 ENCOUNTER — Telehealth: Payer: Self-pay | Admitting: Family Medicine

## 2017-05-01 ENCOUNTER — Other Ambulatory Visit: Payer: Self-pay | Admitting: Family Medicine

## 2017-05-01 MED ORDER — TRAMADOL HCL 50 MG PO TABS: 50.0000 mg | ORAL_TABLET | Freq: Two times a day (BID) | ORAL | 0 refills | Status: DC | PRN

## 2017-05-01 NOTE — Telephone Encounter (Signed)
Spoke with pt ans advised him that he needed to come in for an appt. He stated that he thought it was ridiculous that he had to come in for a refill. I explained that the 28 day contract states that you have to have an appt every 12 weeks. He said he didn't think that was neccessary and he may have to change his doctor because he doesn't feel the need to come in for a 30 day supply.

## 2017-05-01 NOTE — Telephone Encounter (Signed)
Needs follow-up

## 2017-05-01 NOTE — Telephone Encounter (Signed)
Copied from CRM [id number]. Topic: Quick Communication - See Telephone Encounter >> May 01, 2017  8:32 AM Jolayne Hainesaylor, Brittany L wrote: CRM for notification. See Telephone encounter for:   05/01/17.  Refill on traMADol (ULTRAM) 50 MG tablet. Please call patient when script is ready for pick up. [phone]

## 2017-05-01 NOTE — Telephone Encounter (Signed)
His 28 day is up front waiting for him, however, he will need to have his 3 month follow up. I will not continue to give him pain medicine without him following up every 3 months.

## 2017-05-07 ENCOUNTER — Ambulatory Visit: Payer: BLUE CROSS/BLUE SHIELD | Admitting: Family Medicine

## 2017-05-07 ENCOUNTER — Encounter: Payer: Self-pay | Admitting: Family Medicine

## 2017-05-07 VITALS — BP 102/71 | HR 108 | Temp 98.5°F | Wt 143.5 lb

## 2017-05-07 DIAGNOSIS — D72829 Elevated white blood cell count, unspecified: Secondary | ICD-10-CM

## 2017-05-07 DIAGNOSIS — K7011 Alcoholic hepatitis with ascites: Secondary | ICD-10-CM | POA: Diagnosis not present

## 2017-05-07 DIAGNOSIS — E781 Pure hyperglyceridemia: Secondary | ICD-10-CM | POA: Diagnosis not present

## 2017-05-07 DIAGNOSIS — I10 Essential (primary) hypertension: Secondary | ICD-10-CM

## 2017-05-07 DIAGNOSIS — G621 Alcoholic polyneuropathy: Secondary | ICD-10-CM | POA: Diagnosis not present

## 2017-05-07 MED ORDER — NYSTATIN 100000 UNIT/ML MT SUSP: 5.0000 mL | Freq: Two times a day (BID) | OROMUCOSAL | 13 refills | Status: DC

## 2017-05-07 MED ORDER — MIRTAZAPINE 30 MG PO TABS: 30.0000 mg | ORAL_TABLET | Freq: Every day | ORAL | 1 refills | Status: DC

## 2017-05-07 MED ORDER — PREGABALIN 150 MG PO CAPS: 150.0000 mg | ORAL_CAPSULE | Freq: Three times a day (TID) | ORAL | 1 refills | Status: DC

## 2017-05-07 MED ORDER — AMITRIPTYLINE HCL 25 MG PO TABS: 25.0000 mg | ORAL_TABLET | Freq: Every day | ORAL | 1 refills | Status: DC

## 2017-05-07 MED ORDER — ONDANSETRON 4 MG PO TBDP: 4.0000 mg | ORAL_TABLET | Freq: Three times a day (TID) | ORAL | 1 refills | Status: DC | PRN

## 2017-05-07 MED ORDER — ESCITALOPRAM OXALATE 10 MG PO TABS: 10.0000 mg | ORAL_TABLET | Freq: Every day | ORAL | 1 refills | Status: DC

## 2017-05-07 MED ORDER — FLUCONAZOLE 100 MG PO TABS: 100.0000 mg | ORAL_TABLET | Freq: Every day | ORAL | 1 refills | Status: DC

## 2017-05-07 NOTE — Assessment & Plan Note (Signed)
Not under good control. Wants to go up on his tramadol. Discussed that I'm not comfortable with that given his drinking. Will continue the tramadol BID and will continue lyrica and add amitriptyline. Recheck 3 months.

## 2017-05-07 NOTE — Progress Notes (Signed)
BP 102/71 (BP Location: Left Arm, Patient Position: Sitting, Cuff Size: Normal)   Pulse (!) 108   Temp 98.5 F (36.9 C)   Wt 143 lb 8 oz (65.1 kg)   SpO2 97%   BMI 20.59 kg/m    Subjective:    Patient ID: Devin Bryant, male    DOB: [birth year], 62 y.o.   MRN: [id number]  HPI: Devin HusbandsDavid Nale is a 62 y.o. male  Chief Complaint  Patient presents with  . Pain    refill on tramadol  . Thrush    refill on diflucan  . Nausea    refill on zofran   Has been having thrush with his inhalers. Has been using his nystatin, but it isn't working currently.   Continuing to drink 2-3 drinks a day. Following   NEUROPATHY- has gotten worse. Doesn't feel like the lyrica is helping, does not want to go up on the dose, would like to increase his   Neuropathy status: worse  Satisfied with current treatment?: no Medication side effects: no Medication compliance:  excellent compliance Location:  Pain: yes Severity: severe  Quality:  Aching, pins and needles Frequency: constant Bilateral: yes Symmetric: yes Numbness: yes Decreased sensation: yes Weakness: yes Context: worse  Relevant past medical, surgical, family and social history reviewed and updated as indicated. Interim medical history since our last visit reviewed. Allergies and medications reviewed and updated.  Review of Systems  Constitutional: Negative.   Respiratory: Negative.   Cardiovascular: Negative.   Musculoskeletal: Positive for myalgias. Negative for arthralgias, back pain, gait problem, joint swelling, neck pain and neck stiffness.  Neurological: Positive for numbness. Negative for dizziness, tremors, seizures, syncope, facial asymmetry, speech difficulty, weakness, light-headedness and headaches.  Psychiatric/Behavioral: Negative.     Per HPI unless specifically indicated above     Objective:    BP 102/71 (BP Location: Left Arm, Patient Position: Sitting, Cuff Size: Normal)   Pulse (!) 108   Temp 98.5 F  (36.9 C)   Wt 143 lb 8 oz (65.1 kg)   SpO2 97%   BMI 20.59 kg/m   Wt Readings from Last 3 Encounters:  05/07/17 143 lb 8 oz (65.1 kg)  02/18/17 150 lb (68 kg)  02/18/17 150 lb 6.4 oz (68.2 kg)    Physical Exam  Constitutional: He is oriented to person, place, and time. He appears well-developed and well-nourished. He appears ill. No distress.  HENT:  Head: Normocephalic and atraumatic.  Right Ear: Hearing normal.  Left Ear: Hearing normal.  Nose: Nose normal.  Eyes: Conjunctivae and lids are normal. Right eye exhibits no discharge. Left eye exhibits no discharge. No scleral icterus.  Cardiovascular: Normal rate, regular rhythm, normal heart sounds and intact distal pulses. Exam reveals no gallop and no friction rub.  No murmur heard. Pulmonary/Chest: Effort normal and breath sounds normal. No respiratory distress. He has no wheezes. He has no rales. He exhibits no tenderness.  Abdominal: Soft. Bowel sounds are normal. He exhibits no distension and no mass. There is no tenderness. There is no rebound and no guarding.  Musculoskeletal: Normal range of motion.  Neurological: He is alert and oriented to person, place, and time.  Skin: Skin is warm, dry and intact. No rash noted. He is not diaphoretic. No erythema. No pallor.  Psychiatric: He has a normal mood and affect. His speech is normal and behavior is normal. Judgment and thought content normal. Cognition and memory are normal.  Nursing note and vitals reviewed.   Results  for orders placed or performed during the hospital encounter of 02/06/17  Basic metabolic panel  Result Value Ref Range   Sodium 135 135 - 145 mmol/L   Potassium 3.5 3.5 - 5.1 mmol/L   Chloride 92 (L) 101 - 111 mmol/L   CO2 30 22 - 32 mmol/L   Glucose, Bld 90 65 - 99 mg/dL   BUN 5 (L) 6 - 20 mg/dL   Creatinine, Ser 1.61 0.61 - 1.24 mg/dL   Calcium 8.6 (L) 8.9 - 10.3 mg/dL   GFR calc non Af Amer >60 >60 mL/min   GFR calc Af Amer >60 >60 mL/min   Anion gap  13 5 - 15      Assessment & Plan:   Problem List Items Addressed This Visit      Cardiovascular and Mediastinum   Hypertension - Primary    Under good control. Continue current regimen. Continue to monitor. Call with any concerns.       Relevant Orders   Comprehensive metabolic panel   CBC with Differential/Platelet   TSH     Digestive   Alcoholic hepatitis with ascites    Stable. Continue to work with GI. Call with any concerns. Rechecking levels today.      Relevant Orders   Comprehensive metabolic panel   CBC with Differential/Platelet     Nervous and Auditory   Alcohol-induced polyneuropathy (HCC)    Not under good control. Wants to go up on his tramadol. Discussed that I'm not comfortable with that given his drinking. Will continue the tramadol BID and will continue lyrica and add amitriptyline. Recheck 3 months.       Relevant Medications   amitriptyline (ELAVIL) 25 MG tablet   pregabalin (LYRICA) 150 MG capsule   mirtazapine (REMERON) 30 MG tablet   escitalopram (LEXAPRO) 10 MG tablet     Other   Hypertriglyceridemia    Rechecking levels today. Await results. Call with any concerns.       Relevant Orders   Comprehensive metabolic panel   CBC with Differential/Platelet   Lipid Panel w/o Chol/HDL Ratio   Leukocytosis    Rechecking levels today. Await results. Call with any concerns.       Relevant Orders   Comprehensive metabolic panel   CBC with Differential/Platelet       Follow up plan: Return in about 3 months (around 08/07/2017) for Follow up.

## 2017-05-07 NOTE — Assessment & Plan Note (Signed)
Under good control. Continue current regimen. Continue to monitor. Call with any concerns. 

## 2017-05-07 NOTE — Assessment & Plan Note (Signed)
Rechecking levels today. Await results. Call with any concerns.  

## 2017-05-07 NOTE — Assessment & Plan Note (Signed)
Stable. Continue to work with GI. Call with any concerns. Rechecking levels today.

## 2017-05-08 ENCOUNTER — Telehealth: Payer: Self-pay | Admitting: Family Medicine

## 2017-05-08 DIAGNOSIS — E876 Hypokalemia: Secondary | ICD-10-CM

## 2017-05-08 LAB — LIPID PANEL W/O CHOL/HDL RATIO
Cholesterol, Total: 152 mg/dL (ref 100–199)
HDL: 49 mg/dL (ref >39)
LDL Calculated: 81 mg/dL (ref 0–99)
Triglycerides: 112 mg/dL (ref 0–149)
VLDL CHOLESTEROL CAL: 22 mg/dL (ref 5–40)

## 2017-05-08 LAB — COMPREHENSIVE METABOLIC PANEL
ALK PHOS: 134 IU/L — AB (ref 39–117)
ALT: 26 IU/L (ref 0–44)
AST: 65 IU/L — AB (ref 0–40)
Albumin/Globulin Ratio: 0.6 — ABNORMAL LOW (ref 1.2–2.2)
Albumin: 2.9 g/dL — ABNORMAL LOW (ref 3.6–4.8)
BUN/Creatinine Ratio: 11 (ref 10–24)
BUN: 11 mg/dL (ref 8–27)
Bilirubin Total: 0.6 mg/dL (ref 0.0–1.2)
CALCIUM: 8.4 mg/dL — AB (ref 8.6–10.2)
CHLORIDE: 92 mmol/L — AB (ref 96–106)
CO2: 33 mmol/L — AB (ref 20–29)
CREATININE: 1.03 mg/dL (ref 0.76–1.27)
GFR calc Af Amer: 88 mL/min/{1.73_m2} (ref >59)
GFR, EST NON AFRICAN AMERICAN: 76 mL/min/{1.73_m2} (ref >59)
GLUCOSE: 55 mg/dL — AB (ref 65–99)
Globulin, Total: 4.5 g/dL (ref 1.5–4.5)
POTASSIUM: 3.2 mmol/L — AB (ref 3.5–5.2)
Sodium: 143 mmol/L (ref 134–144)
Total Protein: 7.4 g/dL (ref 6.0–8.5)

## 2017-05-08 LAB — CBC WITH DIFFERENTIAL/PLATELET
BASOS ABS: 0 10*3/uL (ref 0.0–0.2)
Basos: 0 %
EOS (ABSOLUTE): 0.3 10*3/uL (ref 0.0–0.4)
Eos: 3 %
HEMOGLOBIN: 13.1 g/dL (ref 13.0–17.7)
Hematocrit: 37.7 % (ref 37.5–51.0)
Immature Grans (Abs): 0.1 10*3/uL (ref 0.0–0.1)
Immature Granulocytes: 1 %
LYMPHS ABS: 1.5 10*3/uL (ref 0.7–3.1)
Lymphs: 12 %
MCH: 36.2 pg — ABNORMAL HIGH (ref 26.6–33.0)
MCHC: 34.7 g/dL (ref 31.5–35.7)
MCV: 104 fL — ABNORMAL HIGH (ref 79–97)
MONOS ABS: 1.1 10*3/uL — AB (ref 0.1–0.9)
Monocytes: 9 %
NEUTROS ABS: 9 10*3/uL — AB (ref 1.4–7.0)
Neutrophils: 75 %
PLATELETS: 162 10*3/uL (ref 150–379)
RBC: 3.62 x10E6/uL — AB (ref 4.14–5.80)
RDW: 13.6 % (ref 12.3–15.4)
WBC: 12 10*3/uL — AB (ref 3.4–10.8)

## 2017-05-08 LAB — TSH: TSH: 0.74 u[IU]/mL (ref 0.450–4.500)

## 2017-05-08 MED ORDER — POTASSIUM CHLORIDE CRYS ER 20 MEQ PO TBCR: 20.0000 meq | EXTENDED_RELEASE_TABLET | Freq: Every day | ORAL | 3 refills | Status: DC

## 2017-05-08 NOTE — Telephone Encounter (Signed)
Message relayed to patient. Verbalized understanding and denied questions.   

## 2017-05-08 NOTE — Telephone Encounter (Signed)
Please let him know that his blood work is stable, but his potassium is low, I've sent him in a Rx for the potassium and I'd like him to have his labs rechecked in about 2 weeks, he can come in just for a lab draw. Order is in. Thanks!

## 2017-05-23 ENCOUNTER — Ambulatory Visit: Payer: BLUE CROSS/BLUE SHIELD | Admitting: Gastroenterology

## 2017-05-23 ENCOUNTER — Telehealth: Payer: Self-pay | Admitting: Gastroenterology

## 2017-05-23 ENCOUNTER — Encounter: Payer: Self-pay | Admitting: Gastroenterology

## 2017-05-23 NOTE — Telephone Encounter (Signed)
Pt called and left vm to cancel apt for rx refill pt states he still has a refill at pharmacy and did not want to keep apt. I returned his call and left vm per nurse pt needs to keep FU apt for further refill on this rx

## 2017-06-03 ENCOUNTER — Other Ambulatory Visit: Payer: Self-pay | Admitting: Family Medicine

## 2017-06-03 NOTE — Telephone Encounter (Signed)
Copied from CRM [phone]. Topic: Quick Communication - Rx Refill/Question >> Jun 03, 2017  1:23 PM Arlyss Gandyichardson, [name], NT wrote: Medication: traMADol Janean Sark(ULTRAM) Has the patient contacted their pharmacy? Yes.   (Agent: If no, request that the patient contact the pharmacy for the refill.) Preferred Pharmacy (with phone number or street name[facility] [address] [facility] [place] Agent: Please be advised that RX refills may take up to 3 business days. We ask that you follow-up with your pharmacy.

## 2017-06-03 NOTE — Telephone Encounter (Signed)
LOV: 05/07/17  Dr. Laural BenesJohnson  [facility] [address]       [place]

## 2017-06-04 ENCOUNTER — Other Ambulatory Visit: Payer: Self-pay | Admitting: Family Medicine

## 2017-06-04 MED ORDER — TRAMADOL HCL 50 MG PO TABS: 50.0000 mg | ORAL_TABLET | Freq: Two times a day (BID) | ORAL | 0 refills | Status: DC | PRN

## 2017-06-11 ENCOUNTER — Other Ambulatory Visit: Payer: Self-pay | Admitting: Family Medicine

## 2017-06-11 MED ORDER — TRAMADOL HCL 50 MG PO TABS: 50.0000 mg | ORAL_TABLET | Freq: Two times a day (BID) | ORAL | 0 refills | Status: DC | PRN

## 2017-06-13 ENCOUNTER — Ambulatory Visit: Payer: Self-pay | Admitting: *Deleted

## 2017-06-13 NOTE — Telephone Encounter (Signed)
Pt stated that he woke up to bleeding from his arms but mostly from the right one, the upper arm. He cleaned his skin and applied neosporin and changed his t shirt. He denies fever, pain or injuring his arms. He has a hx of psoriases, but not in these areas  Pt requesting an appointment to see Dr. Laural BenesJohnson tomorrow. Pt advised that he needs to be seen in the emergency room. [facility] notified and speaking with Dr. Laural BenesJohnson, all in agreement he needs to go the emergency room.  Pt advised of this and has agreed to go [facility].   Reason for Disposition . Reason: professional judgment or information in Reference  Answer Assessment - Initial Assessment Questions 1. REASON FOR CALL: "What is your main concern right now?"     Bleeding upper part of the right arm 2. ONSET: "When did the ___ start?"     Last night 3. SEVERITY: "How bad is the ___?"     severe 4. OTHER SYMPTOMS: none 5. FEVER: "Does your child have a fever?" If so, ask: "What is it, how was it measured, and when did it start?"     no 7. CAUSE: "What do you think is causing the ___?     Not sure 8. TREATMENT: "What have you done so far to try to make this better?     Just tried to clean, neosporin and changed shirts  Protocols used: NO GUIDELINE AVAILABLE-P-AH

## 2017-06-13 NOTE — Telephone Encounter (Signed)
FYI

## 2017-06-15 ENCOUNTER — Emergency Department
Admission: EM | Admit: 2017-06-15 | Discharge: 2017-06-15 | Disposition: A | Discharge status: Home / Self Care | Payer: BLUE CROSS/BLUE SHIELD | Attending: Emergency Medicine | Admitting: Emergency Medicine

## 2017-06-15 ENCOUNTER — Other Ambulatory Visit: Payer: Self-pay

## 2017-06-15 DIAGNOSIS — Y999 Unspecified external cause status: Secondary | ICD-10-CM | POA: Insufficient documentation

## 2017-06-15 DIAGNOSIS — I10 Essential (primary) hypertension: Secondary | ICD-10-CM | POA: Diagnosis not present

## 2017-06-15 DIAGNOSIS — J449 Chronic obstructive pulmonary disease, unspecified: Secondary | ICD-10-CM | POA: Diagnosis not present

## 2017-06-15 DIAGNOSIS — Z9181 History of falling: Secondary | ICD-10-CM | POA: Diagnosis not present

## 2017-06-15 DIAGNOSIS — F10239 Alcohol dependence with withdrawal, unspecified: Secondary | ICD-10-CM | POA: Diagnosis not present

## 2017-06-15 DIAGNOSIS — L409 Psoriasis, unspecified: Secondary | ICD-10-CM | POA: Diagnosis not present

## 2017-06-15 DIAGNOSIS — Z79899 Other long term (current) drug therapy: Secondary | ICD-10-CM | POA: Insufficient documentation

## 2017-06-15 DIAGNOSIS — X58XXXA Exposure to other specified factors, initial encounter: Secondary | ICD-10-CM | POA: Diagnosis not present

## 2017-06-15 DIAGNOSIS — Y929 Unspecified place or not applicable: Secondary | ICD-10-CM | POA: Insufficient documentation

## 2017-06-15 DIAGNOSIS — F17210 Nicotine dependence, cigarettes, uncomplicated: Secondary | ICD-10-CM | POA: Insufficient documentation

## 2017-06-15 DIAGNOSIS — R21 Rash and other nonspecific skin eruption: Secondary | ICD-10-CM | POA: Diagnosis present

## 2017-06-15 DIAGNOSIS — Y939 Activity, unspecified: Secondary | ICD-10-CM | POA: Diagnosis not present

## 2017-06-15 DIAGNOSIS — S40022A Contusion of left upper arm, initial encounter: Secondary | ICD-10-CM | POA: Insufficient documentation

## 2017-06-15 DIAGNOSIS — S40021A Contusion of right upper arm, initial encounter: Secondary | ICD-10-CM | POA: Diagnosis not present

## 2017-06-15 DIAGNOSIS — F10939 Alcohol use, unspecified with withdrawal, unspecified: Secondary | ICD-10-CM

## 2017-06-15 LAB — COMPREHENSIVE METABOLIC PANEL
ALK PHOS: 119 U/L (ref 38–126)
ALT: 47 U/L (ref 17–63)
AST: 122 U/L — ABNORMAL HIGH (ref 15–41)
Albumin: 3 g/dL — ABNORMAL LOW (ref 3.5–5.0)
Anion gap: 12 (ref 5–15)
BILIRUBIN TOTAL: 0.9 mg/dL (ref 0.3–1.2)
BUN: 9 mg/dL (ref 6–20)
CALCIUM: 8.7 mg/dL — AB (ref 8.9–10.3)
CO2: 24 mmol/L (ref 22–32)
CREATININE: 1.03 mg/dL (ref 0.61–1.24)
Chloride: 100 mmol/L — ABNORMAL LOW (ref 101–111)
GFR calc non Af Amer: >60 mL/min (ref >60)
Glucose, Bld: 172 mg/dL — ABNORMAL HIGH (ref 65–99)
Potassium: 3.7 mmol/L (ref 3.5–5.1)
SODIUM: 136 mmol/L (ref 135–145)
TOTAL PROTEIN: 8.1 g/dL (ref 6.5–8.1)

## 2017-06-15 LAB — CBC WITH DIFFERENTIAL/PLATELET
BASOS PCT: 0 %
Basophils Absolute: 0 10*3/uL (ref 0–0.1)
EOS ABS: 0.3 10*3/uL (ref 0–0.7)
Eosinophils Relative: 3 %
HCT: 37.4 % — ABNORMAL LOW (ref 40.0–52.0)
Hemoglobin: 12.9 g/dL — ABNORMAL LOW (ref 13.0–18.0)
Lymphocytes Relative: 11 %
Lymphs Abs: 0.9 10*3/uL — ABNORMAL LOW (ref 1.0–3.6)
MCH: 38.2 pg — ABNORMAL HIGH (ref 26.0–34.0)
MCHC: 34.6 g/dL (ref 32.0–36.0)
MCV: 110.5 fL — ABNORMAL HIGH (ref 80.0–100.0)
MONO ABS: 0.9 10*3/uL (ref 0.2–1.0)
MONOS PCT: 11 %
Neutro Abs: 6.1 10*3/uL (ref 1.4–6.5)
Neutrophils Relative %: 75 %
Platelets: 132 10*3/uL — ABNORMAL LOW (ref 150–440)
RBC: 3.39 MIL/uL — ABNORMAL LOW (ref 4.40–5.90)
RDW: 15 % — AB (ref 11.5–14.5)
WBC: 8.2 10*3/uL (ref 3.8–10.6)

## 2017-06-15 LAB — PROTIME-INR
INR: 0.92
PROTHROMBIN TIME: 12.3 s (ref 11.4–15.2)

## 2017-06-15 MED ORDER — CHLORDIAZEPOXIDE HCL 25 MG PO CAPS
50.0000 mg | ORAL_CAPSULE | Freq: Once | ORAL | Status: AC
  Administered 2017-06-15: 50 mg via ORAL
  Filled 2017-06-15: qty 2

## 2017-06-15 MED ORDER — PREDNISONE 50 MG PO TABS: 50.0000 mg | ORAL_TABLET | Freq: Every day | ORAL | 0 refills | Status: AC

## 2017-06-15 NOTE — ED Provider Notes (Signed)
[facility] Emergency Department Provider Note  ____________________________________________   First MD Initiated Contact with Patient 06/15/17 1557     (approximate)  I have reviewed the triage vital signs and the nursing notes.   HISTORY  Chief Complaint Rash and Recurrent Skin Infections   HPI Devin Bryant is a 62 y.o. male who comes to the emergency department concerned that he may have cellulitis to bilateral upper extremities.  He says he has been treated for cellulitis in the past and this feels similar.  The patient is an alcoholic who drinks heavily every day and he gets the shakes whenever he has stopped drinking.  His last drink was several hours prior to arrival.  According to the patient's wife the patient falls frequently and fell last night and the day before.  He fell onto his right shoulder.  He denies loss of consciousness himself.  He also has a past medical history of psoriasis that has been difficult to treat.  His rash seemed to come on suddenly.  Nothing seems to make it better or worse.  Past Medical History:  Diagnosis Date  . Adjustment disorder with depressed mood   . Alcohol abuse 01/17/2016  . Allergy   . Anxiety   . Cellulitis of arm, left 08/18/2016  . COPD (chronic obstructive pulmonary disease) (HCC)   . ED (erectile dysfunction)   . Elevated glucose   . Elevated serum glutamic pyruvic transaminase (SGPT) level   . Emphysema lung (HCC)   . GERD (gastroesophageal reflux disease)   . Hypertension   . Hypertriglyceridemia   . Liver disease   . Neuropathy   . Tobacco use     Patient Active Problem List   Diagnosis Date Noted  . Thrombocytopenia (HCC) 06/18/2017  . Advance directive discussed with patient 02/18/2017  . GERD (gastroesophageal reflux disease) 01/23/2017  . Tachycardia 01/18/2017  . Protein-calorie malnutrition, severe 12/18/2016  . Olecranon bursitis 04/19/2016  . Pain in elbow 04/19/2016  .  Neuropathy 01/30/2016  . Acute on chronic respiratory failure with hypoxia and hypercapnia (HCC) 01/17/2016  . Nonsustained ventricular tachycardia (HCC) 01/17/2016  . Leukocytosis 01/17/2016  . History of tobacco abuse 01/17/2016  . Liver cirrhosis, alcoholic (HCC) 12/08/2015  . 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 Alcoholic hepatitis with ascites 10/10/2015  . Encephalopathy, hepatic (HCC) 10/10/2015  . Hypokalemia 10/10/2015  . Psoriasis 10/10/2015  . Alcohol dependence with withdrawal with complication (HCC)   . Macrocytosis without anemia 04/11/2015  . Other specified diseases of blood and blood-forming organs 04/11/2015  . Tobacco abuse 10/19/2014  . Generalized anxiety disorder 10/17/2014  . Allergy   . Hypertension   . COPD (chronic obstructive pulmonary disease) (HCC)   . ED (erectile dysfunction)   . Hypertriglyceridemia   . Elevated serum glutamic pyruvic transaminase (SGPT) level     Past Surgical History:  Procedure Laterality Date  . EYE SURGERY     cataract surgery  . KNEE SURGERY     arthroscopic  . SEPTOPLASTY  Feb 2016  . TONSILLECTOMY      Prior to Admission medications   Medication Sig Start Date End Date Taking? Authorizing Provider  ADVAIR HFA 115-21 MCG/ACT inhaler Inhale 2 puffs into the lungs 2 (two) times daily.  10/11/14   [provider]  albuterol (PROVENTIL HFA;VENTOLIN HFA) 108 (90 Base) MCG/ACT inhaler Inhale 2 puffs into the lungs every 6 (six) hours as needed for wheezing or shortness of breath. 04/16/16   Particia Nearing,  PA-C  albuterol (PROVENTIL) (2.5 MG/3ML) 0.083% nebulizer solution Take 3 mLs (2.5 mg total) by nebulization every 4 (four) hours as needed for wheezing or shortness of breath. 04/16/16   Particia Nearing, PA-C  albuterol (VENTOLIN HFA) 108 (90 Base) MCG/ACT inhaler Ventolin HFA 90 mcg/actuation aerosol inhaler    [provider]  amitriptyline (ELAVIL) 25 MG tablet Take 1 tablet (25  mg total) by mouth at bedtime. Patient not taking: Reported on 06/18/2017 05/07/17   Olevia Perches P, DO  Cetirizine HCl (ZYRTEC ALLERGY) 10 MG CAPS Take 10 mg by mouth daily.    [provider]  escitalopram (LEXAPRO) 10 MG tablet Take 1 tablet (10 mg total) by mouth daily. 05/07/17   Johnson, Megan P, DO  esomeprazole (NEXIUM) 20 MG capsule Take 20 mg by mouth daily at 12 noon.    [provider]  feeding supplement, ENSURE ENLIVE, (ENSURE ENLIVE) LIQD Take 237 mLs by mouth 3 (three) times daily between meals. 12/18/16   Adrian Saran, MD  fluconazole (DIFLUCAN) 100 MG tablet Take 1 tablet (100 mg total) by mouth daily. 05/07/17   Johnson, Megan P, DO  fluticasone (FLONASE) 50 MCG/ACT nasal spray Place 1 spray into both nostrils daily.  07/23/14   [provider]  furosemide (LASIX) 40 MG tablet Take 1 tablet (40 mg total) by mouth daily. 04/04/17   Wyline Mood, MD  lactulose (CHRONULAC) 10 GM/15ML solution TAKE 15 MLS (10 G TOTAL) BY MOUTH DAILY. 03/18/17   Wyline Mood, MD  lactulose (CHRONULAC) 10 GM/15ML solution Take 15 mLs (10 g total) by mouth 3 (three) times daily. 04/09/17 07/08/17  Wyline Mood, MD  loperamide (IMODIUM) 2 MG capsule Take 1 capsule (2 mg total) by mouth every 6 (six) hours as needed for diarrhea or loose stools. 08/20/16   Auburn Bilberry, MD  mirtazapine (REMERON) 30 MG tablet Take 1 tablet (30 mg total) by mouth at bedtime. 05/07/17   Johnson, Megan P, DO  mometasone (ELOCON) 0.1 % ointment Apply topically daily. 06/18/17   Olevia Perches P, DO  Multiple Vitamin (MULTIVITAMIN) tablet Take 1 tablet by mouth daily.    [provider]  nystatin (MYCOSTATIN) 100000 UNIT/ML suspension Take 5 mLs (500,000 Units total) by mouth 2 (two) times daily. Uses with his inhalers 05/07/17   Olevia Perches P, DO  ondansetron (ZOFRAN ODT) 4 MG disintegrating tablet Take 1 tablet (4 mg total) by mouth every 8 (eight) hours as needed for nausea or vomiting. 05/07/17   Johnson,  Megan P, DO  potassium chloride SA (K-DUR,KLOR-CON) 20 MEQ tablet Take 1 tablet (20 mEq total) by mouth daily. 05/08/17   Johnson, Megan P, DO  predniSONE (DELTASONE) 50 MG tablet Take 1 tablet (50 mg total) by mouth daily for 5 days. 06/15/17 06/20/17  Merrily Brittle, MD  pregabalin (LYRICA) 150 MG capsule Take 1 capsule (150 mg total) by mouth 3 (three) times daily. 05/07/17 05/07/18  Olevia Perches P, DO  Roflumilast (DALIRESP) 250 MCG TABS Take by mouth. 01/22/17   [provider]  spironolactone (ALDACTONE) 100 MG tablet Take 1 tablet (100 mg total) by mouth daily. 01/28/17   Wyline Mood, MD  tiotropium (SPIRIVA HANDIHALER) 18 MCG inhalation capsule Place 1 capsule (18 mcg total) into inhaler and inhale daily. 10/19/14   Milagros Loll, MD  traMADol (ULTRAM) 50 MG tablet Take 1 tablet (50 mg total) by mouth 2 (two) times daily as needed. 06/11/17   Dorcas Carrow, DO    Allergies Patient  has no known allergies.  Family History  Problem Relation Age of Onset  . Diabetes Mother   . CAD Mother   . Cancer Father        lung  . Heart disease Father   . Heart attack Father   . COPD Sister   . Heart disease Brother   . Heart attack Brother   . COPD Brother   . Hypertension Neg Hx   . Stroke Neg Hx     Social History Social History   Tobacco Use  . Smoking status: Current Every Day Smoker    Packs/day: 1.00    Years: 40.00    Pack years: 40.00    Types: Cigarettes  . Smokeless tobacco: Never Used  Substance Use Topics  . Alcohol use: Yes    Comment: daughter states patient has been drinking heavily for the past three years with increased drinking over the past 6 months.  . Drug use: No    Types: Marijuana    Review of Systems Constitutional: No fever/chills Eyes: No visual changes. ENT: No sore throat. Cardiovascular: Denies chest pain. Respiratory: Denies shortness of breath. Gastrointestinal: No abdominal pain.  No nausea, no vomiting.  No diarrhea.  No  constipation. Genitourinary: Negative for dysuria. Musculoskeletal: Negative for back pain. Skin: Positive for rash. Neurological: Negative for headaches, focal weakness or numbness.   ____________________________________________   PHYSICAL EXAM:  VITAL SIGNS: ED Triage Vitals [06/15/17 1456]  Enc Vitals Group     BP 125/81     Pulse Rate (!) 126     Resp 18     Temp 98.7 F (37.1 C)     Temp Source Oral     SpO2 96 %     Weight 150 lb (68 kg)     Height 5\' 10"  (1.778 m)     Head Circumference      Peak Flow      Pain Score 0     Pain Loc      Pain Edu?      Excl. in GC?     Constitutional: Alert and oriented x4 tremulous and anxious appearing Eyes: PERRL EOMI. mid range and brisk Head: Atraumatic. Nose: No congestion/rhinnorhea. Mouth/Throat: No trismus positive for tongue fasciculations Neck: No stridor.   Cardiovascular: Normal rate, regular rhythm. Grossly normal heart sounds.  Good peripheral circulation. Respiratory: Somewhat increased respiratory effort.  No retractions. Lungs CTAB and moving good air Gastrointestinal: Soft nontender Musculoskeletal: No lower extremity edema positive for hand tremors Neurologic:  Normal speech and language. No gross focal neurologic deficits are appreciated. Skin: The lesions on his right arm are clearly ecchymoses with multiple skin tears.  Left forearm appears to be psoriasis.  No evidence of cellulitis  Psychiatric: Somewhat anxious appearing   ____________________________________________   DIFFERENTIAL includes but not limited to  Alcohol withdrawal, alcohol addiction, cellulitis, mechanical fall, psoriasis, ecchymosis ____________________________________________   LABS (all labs ordered are listed, but only abnormal results are displayed)  Labs Reviewed  COMPREHENSIVE METABOLIC PANEL - Abnormal; Notable for the following components:      Result Value   Chloride 100 (*)    Glucose, Bld 172 (*)    Calcium 8.7  (*)    Albumin 3.0 (*)    AST 122 (*)    All other components within normal limits  CBC WITH DIFFERENTIAL/PLATELET - Abnormal; Notable for the following components:   RBC 3.39 (*)    Hemoglobin 12.9 (*)    HCT 37.4 (*)  MCV 110.5 (*)    MCH 38.2 (*)    RDW 15.0 (*)    Platelets 132 (*)    Lymphs Abs 0.9 (*)    All other components within normal limits  PROTIME-INR    Lab work reviewed by me with significantly elevated MCV consistent with chronic alcohol abuse __________________________________________  EKG   ____________________________________________  RADIOLOGY   ____________________________________________   PROCEDURES  Procedure(s) performed: no  Procedures  Critical Care performed: no  Observation: no ____________________________________________   INITIAL IMPRESSION / ASSESSMENT AND PLAN / ED COURSE  Pertinent labs & imaging results that were available during my care of the patient were reviewed by me and considered in my medical decision making (see chart for details).       ----------------------------------------- 4:53 PM on 06/15/2017 -----------------------------------------  The rash on the patient's left forearm appears to be a psoriasis and on the right arm are multiple lesions consistent with ecchymosis from his previous fall.  He persists with tongue fasciculations and hand tremors despite a single dose of chlordiazepoxide.  I had a lengthy discussion with the patient his wife at bedside and I offered the patient inpatient admission for alcohol withdrawal however he declined stating he does not want to stop drinking.  He is discharged home in improved condition verbalizes understanding and agreement with the plan. ____________________________________________   FINAL CLINICAL IMPRESSION(S) / ED DIAGNOSES  Final diagnoses:  Alcohol withdrawal syndrome with complication (HCC)  Psoriasis  Bruise of both arms  Alcohol dependence with  withdrawal with complication (HCC)      NEW MEDICATIONS STARTED DURING THIS VISIT:  Discharge Medication List as of 06/15/2017  4:52 PM    START taking these medications   Details  predniSONE (DELTASONE) 50 MG tablet Take 1 tablet (50 mg total) by mouth daily for 5 days., Starting Sat 06/15/2017, Until Thu 06/20/2017, Print         Note:  This document was prepared using Dragon voice recognition software and may include unintentional dictation errors.     Merrily Brittleifenbark, Sheilyn Boehlke, MD 06/19/17 2231

## 2017-06-15 NOTE — ED Triage Notes (Addendum)
Pt arrives to ED with bruised spots on arms that are red around the bruises. Has been treated previously for similar symptoms with cellulitis last year. Pt states that they have been bleeding previously. No bleeding noted. States just on upper body. On R shoulder. R upper arm. Noticed R to L FA that is raised and pustule looking. Red on whole L FA. Alert, oriented. Denies blood thinner use. Has fallen recently.

## 2017-06-15 NOTE — ED Notes (Signed)
Pt states that he came to the ED for a recurrent rash on bilat arms - the "rash" on right upper arm appears to be bruising - the rash on right shoulder appears to be a bruise with skin tear - the rash on lower left arm appears to be petechiae - pt has hand shaking due to last drink of alcohol this am and he is an alcoholic

## 2017-06-15 NOTE — Discharge Instructions (Signed)
Please take all of your steroids as prescribed and follow-up with your primary care physician this coming week as needed.  Return to the emergency department sooner for any concerns whatsoever.  It was a pleasure to take care of you today, and thank you for coming to our emergency department.  If you have any questions or concerns before leaving please ask the nurse to grab me and I'm more than happy to go through your aftercare instructions again.  If you were prescribed any opioid pain medication today such as Norco, Vicodin, Percocet, morphine, hydrocodone, or oxycodone please make sure you do not drive when you are taking this medication as it can alter your ability to drive safely.  If you have any concerns once you are home that you are not improving or are in fact getting worse before you can make it to your follow-up appointment, please do not hesitate to call 911 and come back for further evaluation.  Merrily BrittleNeil Marrah Vanevery, MD  Results for orders placed or performed during the hospital encounter of 06/15/17  Comprehensive metabolic panel  Result Value Ref Range   Sodium 136 135 - 145 mmol/L   Potassium 3.7 3.5 - 5.1 mmol/L   Chloride 100 (L) 101 - 111 mmol/L   CO2 24 22 - 32 mmol/L   Glucose, Bld 172 (H) 65 - 99 mg/dL   BUN 9 6 - 20 mg/dL   Creatinine, Ser 4.131.03 0.61 - 1.24 mg/dL   Calcium 8.7 (L) 8.9 - 10.3 mg/dL   Total Protein 8.1 6.5 - 8.1 g/dL   Albumin 3.0 (L) 3.5 - 5.0 g/dL   AST 244122 (H) 15 - 41 U/L   ALT 47 17 - 63 U/L   Alkaline Phosphatase 119 38 - 126 U/L   Total Bilirubin 0.9 0.3 - 1.2 mg/dL   GFR calc non Af Amer >60 >60 mL/min   GFR calc Af Amer >60 >60 mL/min   Anion gap 12 5 - 15  CBC with Differential  Result Value Ref Range   WBC 8.2 3.8 - 10.6 K/uL   RBC 3.39 (L) 4.40 - 5.90 MIL/uL   Hemoglobin 12.9 (L) 13.0 - 18.0 g/dL   HCT 01.037.4 (L) 27.240.0 - 53.652.0 %   MCV 110.5 (H) 80.0 - 100.0 fL   MCH 38.2 (H) 26.0 - 34.0 pg   MCHC 34.6 32.0 - 36.0 g/dL   RDW 64.415.0 (H) 03.411.5  - 14.5 %   Platelets 132 (L) 150 - 440 K/uL   Neutrophils Relative % 75 %   Neutro Abs 6.1 1.4 - 6.5 K/uL   Lymphocytes Relative 11 %   Lymphs Abs 0.9 (L) 1.0 - 3.6 K/uL   Monocytes Relative 11 %   Monocytes Absolute 0.9 0.2 - 1.0 K/uL   Eosinophils Relative 3 %   Eosinophils Absolute 0.3 0 - 0.7 K/uL   Basophils Relative 0 %   Basophils Absolute 0.0 0 - 0.1 K/uL

## 2017-06-18 ENCOUNTER — Encounter: Payer: Self-pay | Admitting: Family Medicine

## 2017-06-18 ENCOUNTER — Ambulatory Visit: Payer: BLUE CROSS/BLUE SHIELD | Admitting: Family Medicine

## 2017-06-18 VITALS — BP 121/78 | HR 104 | Temp 98.5°F | Wt 150.1 lb

## 2017-06-18 DIAGNOSIS — K7011 Alcoholic hepatitis with ascites: Secondary | ICD-10-CM | POA: Diagnosis not present

## 2017-06-18 DIAGNOSIS — D7589 Other specified diseases of blood and blood-forming organs: Secondary | ICD-10-CM

## 2017-06-18 DIAGNOSIS — D696 Thrombocytopenia, unspecified: Secondary | ICD-10-CM | POA: Diagnosis not present

## 2017-06-18 MED ORDER — CYANOCOBALAMIN 1000 MCG/ML IJ SOLN
1000.0000 ug | INTRAMUSCULAR | Status: DC
  Administered 2017-06-18 – 2017-07-02 (×2): 1000 ug via INTRAMUSCULAR

## 2017-06-18 MED ORDER — MOMETASONE FUROATE 0.1 % EX OINT: TOPICAL_OINTMENT | Freq: Every day | CUTANEOUS | 3 refills | Status: DC

## 2017-06-18 NOTE — Assessment & Plan Note (Signed)
Not interested in quitting drinking. Stable. Platelets down a bit. Will continue to monitor. Recheck at appointment in June.

## 2017-06-18 NOTE — Patient Instructions (Addendum)
Tiffany Reel Christan Clinton SawyerWilliamson Thrombocytopenia Thrombocytopenia is a condition in which you have an abnormally decreased number of platelets in your blood. Platelets are also called thrombocytes. Platelets are needed for blood clotting. Some cases of thrombocytopenia are mild while others are more severe. What are the causes? This condition may be caused by:  Decreased production of platelets. This can be caused by: ? Aplastic anemia, in which your bone marrow quits making blood cells. ? Cancer in the bone marrow. ? Use of certain medicines, including chemotherapy. ? Infection in the bone marrow. ? Heavy alcohol consumption.  Increased destruction of platelets. This can be caused by: ? Certain immune diseases. ? Use of certain drugs. ? Certain blood clotting disorders. ? Certain inherited disorders. ? Certain bleeding disorders. ? Pregnancy.  Having an enlarged spleen (hypersplenism). In hypersplenism, the spleen gathers up platelets from circulation. This means that the platelets are not available to help with blood clotting. The spleen can be enlarged because of cirrhosis or other conditions.  What are the signs or symptoms? Symptoms of this condition are side effects of poor blood clotting. They will vary depending on how low the platelet counts are. Symptoms may include:  Abnormal bleeding.  Nosebleeds.  Heavy menstrual periods.  Blood in the urine or stool (feces).  A purplish discoloration in the skin (purpura).  Bruising.  A rash that looks like pinpoint, purplish-red spots (petechiae) on the skin and mucous membranes.  How is this diagnosed? This condition may be diagnosed with blood tests and a physical exam. Sometimes, a sample of bone marrow may be removed to look for the original cells (megakaryocytes) that make platelets. How is this treated? Treatment for this condition depends on the cause. Treatment options may include:  Treatment of another condition  that is causing the low platelet count.  Medicines to help protect your platelets from being destroyed.  A replacement (transfusion) of platelets to stop or prevent bleeding.  Surgery to remove the spleen.  Follow these instructions at home: General instructions  Check your skin and the linings inside your mouth for bruising or bleeding as told by your health care provider.  Check your sputum, urine, and stool for blood as told by your health care provider.  Ask your health care provider if it is okay for you to drink alcohol.  Take over-the-counter and prescription medicines only as told by your health care provider.  Tell all of your health care providers, including dentists and eye doctors, about your condition. Activity  Until your health care provider says it is okay. ? Do not return to any activities that could cause bumps or bruises.  Take extra care not to cut yourself when you shave or when you use scissors, needles, knives, and other tools.  Take extra care not to burn yourself when ironing or cooking. Contact a health care provider if:  You have unexplained bruising. Get help right away if:  You have active bleeding from anywhere on your body.  You have blood in your sputum, urine, or stool. This information is not intended to replace advice given to you by your health care provider. Make sure you discuss any questions you have with your health care provider. Document Released: 02/19/2005 Document Revised: 10/23/2015 Document Reviewed: 08/23/2014 Elsevier Interactive Patient Education  2018 [employer]

## 2017-06-18 NOTE — Assessment & Plan Note (Signed)
Will start B12 shots. Will come in monthly for them. Call with any concerns.

## 2017-06-18 NOTE — Progress Notes (Signed)
BP 121/78 (BP Location: Left Arm, Patient Position: Sitting, Cuff Size: Normal)   Pulse (!) 104   Temp 98.5 F (36.9 C) (Oral)   Wt 150 lb 2 oz (68.1 kg)   SpO2 96%   BMI 21.54 kg/m    Subjective:    Patient ID: Devin Bryant, male    DOB: 06/11/1955, 62 y.o.   MRN: [id number]  HPI: Yosef Krogh is a 62 y.o. male  Chief Complaint  Patient presents with  . Hospitalization Follow-up    alcohol withdrawal syndrome.   ER FOLLOW UP Time since discharge: 3 days Hospital/facility: [facility] Diagnosis: psoriasis and bruising, alcohol withdrawl Procedures/tests: none Consultants: none New medications: prednisone 50mg  x 5 days  Discharge instructions:  Follow up here Status: better  Relevant past medical, surgical, family and social history reviewed and updated as indicated. Interim medical history since our last visit reviewed. Allergies and medications reviewed and updated.  Review of Systems  Constitutional: Negative.   Respiratory: Negative.   Cardiovascular: Negative.   Skin: Positive for color change, rash and wound. Negative for pallor.  Neurological: Negative.   Hematological: Negative for adenopathy. Bruises/bleeds easily.  Psychiatric/Behavioral: Negative.     Per HPI unless specifically indicated above     Objective:    BP 121/78 (BP Location: Left Arm, Patient Position: Sitting, Cuff Size: Normal)   Pulse (!) 104   Temp 98.5 F (36.9 C) (Oral)   Wt 150 lb 2 oz (68.1 kg)   SpO2 96%   BMI 21.54 kg/m   Wt Readings from Last 3 Encounters:  06/18/17 150 lb 2 oz (68.1 kg)  06/15/17 150 lb (68 kg)  05/07/17 143 lb 8 oz (65.1 kg)    Physical Exam  Constitutional: He is oriented to person, place, and time. He appears well-developed and well-nourished. No distress.  HENT:  Head: Normocephalic and atraumatic.  Right Ear: Hearing normal.  Left Ear: Hearing normal.  Nose: Nose normal.  Eyes: Conjunctivae and lids are normal. Right eye exhibits no discharge.  Left eye exhibits no discharge. No scleral icterus.  Cardiovascular: Normal rate, regular rhythm, normal heart sounds and intact distal pulses. Exam reveals no gallop and no friction rub.  No murmur heard. Pulmonary/Chest: Effort normal and breath sounds normal. No stridor. No respiratory distress. He has no wheezes. He has no rales. He exhibits no tenderness.  Musculoskeletal: Normal range of motion.  Neurological: He is alert and oriented to person, place, and time.  Skin: Skin is warm, dry and intact. Capillary refill takes less than 2 seconds. No rash noted. He is not diaphoretic. No erythema. No pallor.  Bruising on arms bilaterally  Psychiatric: He has a normal mood and affect. His speech is normal and behavior is normal. Judgment and thought content normal. Cognition and memory are normal.    Results for orders placed or performed during the hospital encounter of 06/15/17  Comprehensive metabolic panel  Result Value Ref Range   Sodium 136 135 - 145 mmol/L   Potassium 3.7 3.5 - 5.1 mmol/L   Chloride 100 (L) 101 - 111 mmol/L   CO2 24 22 - 32 mmol/L   Glucose, Bld 172 (H) 65 - 99 mg/dL   BUN 9 6 - 20 mg/dL   Creatinine, Ser 1.19 0.61 - 1.24 mg/dL   Calcium 8.7 (L) 8.9 - 10.3 mg/dL   Total Protein 8.1 6.5 - 8.1 g/dL   Albumin 3.0 (L) 3.5 - 5.0 g/dL   AST 147 (H) 15 - 41  U/L   ALT 47 17 - 63 U/L   Alkaline Phosphatase 119 38 - 126 U/L   Total Bilirubin 0.9 0.3 - 1.2 mg/dL   GFR calc non Af Amer >60 >60 mL/min   GFR calc Af Amer >60 >60 mL/min   Anion gap 12 5 - 15  CBC with Differential  Result Value Ref Range   WBC 8.2 3.8 - 10.6 K/uL   RBC 3.39 (L) 4.40 - 5.90 MIL/uL   Hemoglobin 12.9 (L) 13.0 - 18.0 g/dL   HCT 40.937.4 (L) 81.140.0 - 91.452.0 %   MCV 110.5 (H) 80.0 - 100.0 fL   MCH 38.2 (H) 26.0 - 34.0 pg   MCHC 34.6 32.0 - 36.0 g/dL   RDW 78.215.0 (H) 95.611.5 - 21.314.5 %   Platelets 132 (L) 150 - 440 K/uL   Neutrophils Relative % 75 %   Neutro Abs 6.1 1.4 - 6.5 K/uL   Lymphocytes Relative  11 %   Lymphs Abs 0.9 (L) 1.0 - 3.6 K/uL   Monocytes Relative 11 %   Monocytes Absolute 0.9 0.2 - 1.0 K/uL   Eosinophils Relative 3 %   Eosinophils Absolute 0.3 0 - 0.7 K/uL   Basophils Relative 0 %   Basophils Absolute 0.0 0 - 0.1 K/uL  Protime-INR  Result Value Ref Range   Prothrombin Time 12.3 11.4 - 15.2 seconds   INR 0.92       Assessment & Plan:   Problem List Items Addressed This Visit      Digestive   Alcoholic hepatitis with ascites    Not interested in quitting drinking. Stable. Platelets down a bit. Will continue to monitor. Recheck at appointment in June.         Other   Macrocytosis without anemia - Primary    Will start B12 shots. Will come in monthly for them. Call with any concerns.       Thrombocytopenia (HCC)    Discussed that this can cause his bruising. Platelets down a bit. Will continue to monitor. Recheck at appointment in June.        Other Visit Diagnoses    Macrocytosis       Relevant Medications   cyanocobalamin ((VITAMIN B-12)) injection 1,000 mcg       Follow up plan: Return if symptoms worsen or fail to improve.

## 2017-06-18 NOTE — Assessment & Plan Note (Signed)
Discussed that this can cause his bruising. Platelets down a bit. Will continue to monitor. Recheck at appointment in June.

## 2017-07-02 ENCOUNTER — Ambulatory Visit (INDEPENDENT_AMBULATORY_CARE_PROVIDER_SITE_OTHER): Payer: BLUE CROSS/BLUE SHIELD

## 2017-07-02 DIAGNOSIS — D7589 Other specified diseases of blood and blood-forming organs: Secondary | ICD-10-CM

## 2017-07-23 ENCOUNTER — Telehealth: Payer: Self-pay | Admitting: Family Medicine

## 2017-07-23 MED ORDER — TRAMADOL HCL 50 MG PO TABS: 50.0000 mg | ORAL_TABLET | Freq: Two times a day (BID) | ORAL | 0 refills | Status: DC | PRN

## 2017-07-23 NOTE — Telephone Encounter (Signed)
28 day cycle changing with e-rx. Last seen in follow up 05/07/17. Due 08/07/17. Last refill 07/02/17. Due 07/30/17. Has appointment scheduled for 08/08/17. Rx set up. 1 Rxs sent as below.

## 2017-07-24 ENCOUNTER — Other Ambulatory Visit: Payer: Self-pay | Admitting: Gastroenterology

## 2017-07-24 DIAGNOSIS — K7031 Alcoholic cirrhosis of liver with ascites: Secondary | ICD-10-CM

## 2017-07-29 ENCOUNTER — Other Ambulatory Visit: Payer: Self-pay | Admitting: Family Medicine

## 2017-07-29 ENCOUNTER — Other Ambulatory Visit: Payer: Self-pay | Admitting: Gastroenterology

## 2017-07-29 DIAGNOSIS — K7031 Alcoholic cirrhosis of liver with ascites: Secondary | ICD-10-CM

## 2017-08-01 NOTE — Telephone Encounter (Signed)
LOV  05/07/17 Dr. Laural Benes Last refill  05/08/17  # 30  With 3 refills

## 2017-08-08 ENCOUNTER — Ambulatory Visit: Payer: BLUE CROSS/BLUE SHIELD | Admitting: Family Medicine

## 2017-08-13 ENCOUNTER — Telehealth: Payer: Self-pay | Admitting: Gastroenterology

## 2017-08-13 ENCOUNTER — Other Ambulatory Visit: Payer: Self-pay

## 2017-08-13 NOTE — Telephone Encounter (Signed)
Pt called to set up appointment he would also like for Dr. Tobi BastosAnna to put an order in for Parathentis  Please call pt

## 2017-08-14 ENCOUNTER — Encounter (INDEPENDENT_AMBULATORY_CARE_PROVIDER_SITE_OTHER): Payer: Self-pay

## 2017-08-14 ENCOUNTER — Encounter: Payer: Self-pay | Admitting: Gastroenterology

## 2017-08-14 ENCOUNTER — Ambulatory Visit (INDEPENDENT_AMBULATORY_CARE_PROVIDER_SITE_OTHER): Payer: BLUE CROSS/BLUE SHIELD | Admitting: Gastroenterology

## 2017-08-14 DIAGNOSIS — K7031 Alcoholic cirrhosis of liver with ascites: Secondary | ICD-10-CM | POA: Diagnosis not present

## 2017-08-14 MED ORDER — FUROSEMIDE 40 MG PO TABS
40.0000 mg | ORAL_TABLET | Freq: Every day | ORAL | 1 refills | Status: DC
Start: 2017-08-14 — End: 2017-09-09

## 2017-08-14 MED ORDER — SPIRONOLACTONE 100 MG PO TABS: 100.0000 mg | ORAL_TABLET | Freq: Every day | ORAL | 2 refills | Status: DC

## 2017-08-14 MED ORDER — ALBUMIN HUMAN 25 % IV SOLN: 50.0000 g | Freq: Once | INTRAVENOUS | Status: DC

## 2017-08-14 NOTE — Progress Notes (Signed)
Wyline Mood MD, MRCP(U.K) [address]  [address]  [place], [place] [address]  Main: [phone]  Fax: [phone]   Primary Care Physician: Dorcas Carrow, DO  Primary Gastroenterologist:  Dr. Wyline Mood   No chief complaint on file.   HPI: Devin Bryant is a 62 y.o. male    Summary of history : The patient is here to  follow up.He was seen by Dr. Allegra Lai been admitted on 12/17/2016 with abdominal distention jaundice which had worsened over the prior 2-3 weeks. At that point of time he also had some diarrhea and nonbloody emesis. He was actively consuming alcohol  prior to hospital admission. On admission his liver function tests were elevated with a total bilirubin of 11 ultrasound Doppler was negative for portal vein thrombosis, he had an abdominal paracentesis and the fluid tested with negative for spontaneous bacterial peritonitis. He had been on Aldactone as an outpatient. He has been treated for alcoholic hepatitis in 10/23/2015 with steroids. He was treated for his cirrhosis low-sodium diet commenced on Lasix 20 mg and Aldactone 50 mg with plan for endoscopy as an outpatient to screen for varices.   Labs 12/18/2016 AFP 7.3 , 31 smooth muscle antibody 17 ANA negative ,B12 normal folate normal ferritin elevated. SAAGwas suggestive of portal hypertension. INR 1.09. The bilirubin was 5.7 with a direct component. TSH 1.49 on 01/18/2017 CMP on 01/18/2017 with a creatinine of 0.66 albumin of 2.6 alkaline phosphatase of 247 total bilirubin down at 2.4.   Underwent paracentesis and had 1.6 litres of fluid taken out  On 02/15/17 .   Interval history12/17/18-6/02/2018   He was seen in the ER on 06/15/17 for alcohol withdrawal   Did not follow up , non compliant.   Doing well. Still drinking 3-4 spirits a day . Having the shakes, He has ha drink first thing in the morning .   Abdomen has been getting distended.   Having a bowel movement daily - soft - not  taking lactulose  On lasix 40 mg and aldactone 100 mg a day.   No confusion mostly but yes at times.   He says he has a living well .    Current Outpatient Medications  Medication Sig Dispense Refill  . ADVAIR HFA 115-21 MCG/ACT inhaler Inhale 2 puffs into the lungs 2 (two) times daily.     Marland Kitchen albuterol (PROVENTIL HFA;VENTOLIN HFA) 108 (90 Base) MCG/ACT inhaler Inhale 2 puffs into the lungs every 6 (six) hours as needed for wheezing or shortness of breath. 1 Inhaler 12  . albuterol (PROVENTIL) (2.5 MG/3ML) 0.083% nebulizer solution Take 3 mLs (2.5 mg total) by nebulization every 4 (four) hours as needed for wheezing or shortness of breath. 50 mL 3  . albuterol (VENTOLIN HFA) 108 (90 Base) MCG/ACT inhaler Ventolin HFA 90 mcg/actuation aerosol inhaler    . amitriptyline (ELAVIL) 25 MG tablet Take 1 tablet (25 mg total) by mouth at bedtime. (Patient not taking: Reported on 06/18/2017) 90 tablet 1  . Cetirizine HCl (ZYRTEC ALLERGY) 10 MG CAPS Take 10 mg by mouth daily.    Marland Kitchen escitalopram (LEXAPRO) 10 MG tablet Take 1 tablet (10 mg total) by mouth daily. 90 tablet 1  . esomeprazole (NEXIUM) 20 MG capsule Take 20 mg by mouth daily at 12 noon.    . feeding supplement, ENSURE ENLIVE, (ENSURE ENLIVE) LIQD Take 237 mLs by mouth 3 (three) times daily between meals. 237 mL 12  . fluconazole (DIFLUCAN) 100 MG tablet Take 1 tablet (  100 mg total) by mouth daily. 7 tablet 1  . fluticasone (FLONASE) 50 MCG/ACT nasal spray Place 1 spray into both nostrils daily.     . furosemide (LASIX) 40 MG tablet Take 1 tablet (40 mg total) by mouth daily. 30 tablet 1  . KLOR-CON M20 20 MEQ tablet TAKE 1 TABLET BY MOUTH EVERY DAY 30 tablet 2  . lactulose (CHRONULAC) 10 GM/15ML solution TAKE 15 MLS (10 G TOTAL) BY MOUTH DAILY. 240 mL 0  . loperamide (IMODIUM) 2 MG capsule Take 1 capsule (2 mg total) by mouth every 6 (six) hours as needed for diarrhea or loose stools. 30 capsule 0  . mirtazapine (REMERON) 30 MG tablet Take 1  tablet (30 mg total) by mouth at bedtime. 90 tablet 1  . mometasone (ELOCON) 0.1 % ointment Apply topically daily. 45 g 3  . Multiple Vitamin (MULTIVITAMIN) tablet Take 1 tablet by mouth daily.    Marland Kitchen nystatin (MYCOSTATIN) 100000 UNIT/ML suspension Take 5 mLs (500,000 Units total) by mouth 2 (two) times daily. Uses with his inhalers 60 mL 13  . ondansetron (ZOFRAN ODT) 4 MG disintegrating tablet Take 1 tablet (4 mg total) by mouth every 8 (eight) hours as needed for nausea or vomiting. 60 tablet 1  . prednisoLONE acetate (PRED FORTE) 1 % ophthalmic suspension     . pregabalin (LYRICA) 150 MG capsule Take 1 capsule (150 mg total) by mouth 3 (three) times daily. 270 capsule 1  . Roflumilast (DALIRESP) 250 MCG TABS Take by mouth.    . spironolactone (ALDACTONE) 100 MG tablet Take 1 tablet (100 mg total) by mouth daily. 60 tablet 2  . theophylline (THEODUR) 300 MG 12 hr tablet     . tiotropium (SPIRIVA HANDIHALER) 18 MCG inhalation capsule Place 1 capsule (18 mcg total) into inhaler and inhale daily. 30 capsule 0  . traMADol (ULTRAM) 50 MG tablet Take 1 tablet (50 mg total) by mouth 2 (two) times daily as needed for up to 28 days. 56 tablet 0   Current Facility-Administered Medications  Medication Dose Route Frequency Provider Last Rate Last Dose  . albumin human 25 % solution 50 g  50 g Intravenous Once Wyline Mood, MD      . albumin human 25 % solution 50 g  50 g Intravenous Once Wyline Mood, MD      . cyanocobalamin ((VITAMIN B-12)) injection 1,000 mcg  1,000 mcg Intramuscular Q30 days Dorcas Carrow, DO   1,000 mcg at 07/02/17 1633    Allergies as of 08/14/2017  . (No Known Allergies)    ROS:  General: Negative for anorexia, weight loss, fever, chills, fatigue, weakness. ENT: Negative for hoarseness, difficulty swallowing , nasal congestion. CV: Negative for chest pain, angina, palpitations, dyspnea on exertion, peripheral edema.  Respiratory: Negative for dyspnea at rest, dyspnea on  exertion, cough, sputum, wheezing.  GI: See history of present illness. GU:  Negative for dysuria, hematuria, urinary incontinence, urinary frequency, nocturnal urination.  Endo: Negative for unusual weight change.    Physical Examination:   There were no vitals taken for this visit.  General: in a wheel chair  Eyes: No icterus. Conjunctivae pink. Mouth: Oropharyngeal mucosa moist and pink , no lesions erythema or exudate. Lungs: Clear to auscultation bilaterally. Non-labored. Heart: Regular rate and rhythm, no murmurs rubs or gallops.  Abdomen: Bowel sounds are normal, nontender, distended with free fluid,  no hepatosplenomegaly or masses, no abdominal bruits or hernia , no rebound or guarding.   Extremities: edema +  Neuro: Alert and oriented x 3.  Grossly intact. Skin: Warm and dry, no jaundice.   Psych: Alert and cooperative, normal mood and affect.   Imaging Studies: No results found.  Assessment and Plan:   Devin HusbandsDavid Bryant is a 62 y.o. y/o male  here to follow up for alcoholic liver cirrhosis (decompensated) , with ascites, no encephalopathy and active alcohol consumption. He would like a paracentesis to be arranged  Plan 1. Refill provided for aldactone and lasix 2. PRN paracentesis for removal of fluid 3. He is not keen on alcohol cessation , aware he will die as a result from liver failure or from an infection or from a GI bleed. He does not wish for any more tests such as [facility] to [facility] or an EGD to screen for varices. He has a living will and will make decisions about ICU care , endoscopy for the future and add to his living will when he decides 4. F/u with Dorcas CarrowJohnson, Megan P, DO   Dr Wyline MoodKiran Tanvir Hipple  MD,MRCP [facility](U.K) Follow up PRN

## 2017-08-14 NOTE — Addendum Note (Signed)
Addended by: Ardyth ManARTER, Art Levan Z on: 08/14/2017 11:49 AM   Modules accepted: Orders

## 2017-08-15 NOTE — Discharge Instructions (Signed)
Paracentesis, Care After °Refer to this sheet in the next few weeks. These instructions provide you with information about caring for yourself after your procedure. Your health care provider may also give you more specific instructions. Your treatment has been planned according to current medical practices, but problems sometimes occur. Call your health care provider if you have any problems or questions after your procedure. °What can I expect after the procedure? °After your procedure, it is common to have a small amount of clear fluid coming from the puncture site. °Follow these instructions at home: °· Return to your normal activities as told by your health care provider. Ask your health care provider what activities are safe for you. °· Take over-the-counter and prescription medicines only as told by your health care provider. °· Do not take baths, swim, or use a hot tub until your health care provider approves. °· Follow instructions from your health care provider about: °? How to take care of your puncture site. °? When and how you should change your bandage (dressing). °? When you should remove your dressing. °· Check your puncture area every day signs of infection. Watch for: °? Redness, swelling, or pain. °? Fluid, blood, or pus. °· Keep all follow-up visits as told by your health care provider. This is important. °Contact a health care provider if: °· You have redness, swelling, or pain at your puncture site. °· You start to have more clear fluid coming from your puncture site. °· You have blood or pus coming from your puncture site. °· You have chills. °· You have a fever. °Get help right away if: °· You develop chest pain or shortness of breath. °· You develop increasing pain, discomfort, or swelling in your abdomen. °· You feel dizzy or light-headed or you pass out. °This information is not intended to replace advice given to you by your health care provider. Make sure you discuss any questions you  have with your health care provider. °Document Released: 07/06/2014 Document Revised: 07/28/2015 Document Reviewed: 05/04/2014 °Elsevier Interactive Patient Education © 2018 [employer] ° °

## 2017-08-16 ENCOUNTER — Ambulatory Visit
Admission: RE | Admit: 2017-08-16 | Discharge: 2017-08-16 | Disposition: A | Discharge status: Home / Self Care | Payer: BLUE CROSS/BLUE SHIELD | Source: Ambulatory Visit | Attending: Gastroenterology | Admitting: Gastroenterology

## 2017-08-16 DIAGNOSIS — K7031 Alcoholic cirrhosis of liver with ascites: Secondary | ICD-10-CM | POA: Diagnosis not present

## 2017-08-16 NOTE — Procedures (Signed)
US guided paracentesis.  Removed 1 liter of yellow fluid from RLQ.  Minimal blood loss and no immediate complication.

## 2017-08-28 ENCOUNTER — Telehealth: Payer: Self-pay | Admitting: Family Medicine

## 2017-08-30 ENCOUNTER — Telehealth: Payer: Self-pay | Admitting: Family Medicine

## 2017-08-30 NOTE — Telephone Encounter (Signed)
Patient no-showed his appointment on 08/08/17. Can see someone else- but will need an appointment before getting refills. Please keep appointment with me for 7/22

## 2017-08-30 NOTE — Telephone Encounter (Signed)
Pt called in and scheduled ov with PCP on next available, July 22,19. Pt says that he is completely out of his medication. (Tramadol)   Please assist.

## 2017-09-02 NOTE — Telephone Encounter (Signed)
Patient called and stated he was all out of his Tramadol medication.  He did schedule an appointment for 09/23/17, but feels he cannot wait that long before he gets his medication.  CB# [phone].

## 2017-09-02 NOTE — Telephone Encounter (Signed)
Per Epic, tramadol was refused and patient needs to keep his upcoming appointment.

## 2017-09-02 NOTE — Telephone Encounter (Signed)
LVM for pt to call back.

## 2017-09-04 ENCOUNTER — Ambulatory Visit: Payer: BLUE CROSS/BLUE SHIELD | Admitting: Gastroenterology

## 2017-09-09 ENCOUNTER — Encounter: Payer: Self-pay | Admitting: Family Medicine

## 2017-09-09 ENCOUNTER — Ambulatory Visit: Payer: BLUE CROSS/BLUE SHIELD | Admitting: Family Medicine

## 2017-09-09 ENCOUNTER — Other Ambulatory Visit: Payer: Self-pay

## 2017-09-09 VITALS — BP 101/64 | HR 116 | Temp 98.6°F | Wt 176.1 lb

## 2017-09-09 DIAGNOSIS — D696 Thrombocytopenia, unspecified: Secondary | ICD-10-CM

## 2017-09-09 DIAGNOSIS — G629 Polyneuropathy, unspecified: Secondary | ICD-10-CM | POA: Diagnosis not present

## 2017-09-09 DIAGNOSIS — Z7189 Other specified counseling: Secondary | ICD-10-CM | POA: Diagnosis not present

## 2017-09-09 DIAGNOSIS — I10 Essential (primary) hypertension: Secondary | ICD-10-CM

## 2017-09-09 DIAGNOSIS — E781 Pure hyperglyceridemia: Secondary | ICD-10-CM | POA: Diagnosis not present

## 2017-09-09 DIAGNOSIS — D7589 Other specified diseases of blood and blood-forming organs: Secondary | ICD-10-CM

## 2017-09-09 DIAGNOSIS — R609 Edema, unspecified: Secondary | ICD-10-CM | POA: Diagnosis not present

## 2017-09-09 DIAGNOSIS — R740 Nonspecific elevation of levels of transaminase and lactic acid dehydrogenase [LDH]: Secondary | ICD-10-CM | POA: Diagnosis not present

## 2017-09-09 DIAGNOSIS — G621 Alcoholic polyneuropathy: Secondary | ICD-10-CM | POA: Diagnosis not present

## 2017-09-09 DIAGNOSIS — K7011 Alcoholic hepatitis with ascites: Secondary | ICD-10-CM | POA: Diagnosis not present

## 2017-09-09 DIAGNOSIS — E43 Unspecified severe protein-calorie malnutrition: Secondary | ICD-10-CM | POA: Diagnosis not present

## 2017-09-09 DIAGNOSIS — J069 Acute upper respiratory infection, unspecified: Secondary | ICD-10-CM

## 2017-09-09 DIAGNOSIS — K7031 Alcoholic cirrhosis of liver with ascites: Secondary | ICD-10-CM

## 2017-09-09 DIAGNOSIS — R7401 Elevation of levels of liver transaminase levels: Secondary | ICD-10-CM

## 2017-09-09 MED ORDER — ESCITALOPRAM OXALATE 10 MG PO TABS: 10.0000 mg | ORAL_TABLET | Freq: Every day | ORAL | 1 refills | Status: DC

## 2017-09-09 MED ORDER — TRAMADOL HCL 50 MG PO TABS: 50.0000 mg | ORAL_TABLET | Freq: Two times a day (BID) | ORAL | 0 refills | Status: DC | PRN

## 2017-09-09 MED ORDER — LACTULOSE 10 GM/15ML PO SOLN: 10.0000 g | Freq: Every day | ORAL | 12 refills | Status: DC

## 2017-09-09 MED ORDER — ALBUTEROL SULFATE HFA 108 (90 BASE) MCG/ACT IN AERS: 2.0000 | INHALATION_SPRAY | Freq: Four times a day (QID) | RESPIRATORY_TRACT | 12 refills | Status: DC | PRN

## 2017-09-09 MED ORDER — AMITRIPTYLINE HCL 25 MG PO TABS: 25.0000 mg | ORAL_TABLET | Freq: Every day | ORAL | 1 refills | Status: DC

## 2017-09-09 MED ORDER — PREGABALIN 150 MG PO CAPS: 150.0000 mg | ORAL_CAPSULE | Freq: Three times a day (TID) | ORAL | 1 refills | Status: DC

## 2017-09-09 MED ORDER — TIOTROPIUM BROMIDE MONOHYDRATE 18 MCG IN CAPS: 18.0000 ug | ORAL_CAPSULE | Freq: Every day | RESPIRATORY_TRACT | 1 refills | Status: DC

## 2017-09-09 MED ORDER — FUROSEMIDE 40 MG PO TABS: 40.0000 mg | ORAL_TABLET | Freq: Every day | ORAL | 1 refills | Status: DC

## 2017-09-09 MED ORDER — MIRTAZAPINE 30 MG PO TABS: 30.0000 mg | ORAL_TABLET | Freq: Every day | ORAL | 1 refills | Status: DC

## 2017-09-09 MED ORDER — ALBUTEROL SULFATE (2.5 MG/3ML) 0.083% IN NEBU: 2.5000 mg | INHALATION_SOLUTION | RESPIRATORY_TRACT | 3 refills | Status: DC | PRN

## 2017-09-09 MED ORDER — POTASSIUM CHLORIDE CRYS ER 20 MEQ PO TBCR: 20.0000 meq | EXTENDED_RELEASE_TABLET | Freq: Every day | ORAL | 1 refills | Status: DC

## 2017-09-09 MED ORDER — SPIRONOLACTONE 100 MG PO TABS: 100.0000 mg | ORAL_TABLET | Freq: Every day | ORAL | 1 refills | Status: DC

## 2017-09-09 MED ORDER — ADVAIR HFA 115-21 MCG/ACT IN AERO: 2.0000 | INHALATION_SPRAY | Freq: Two times a day (BID) | RESPIRATORY_TRACT | 3 refills | Status: DC

## 2017-09-09 MED ORDER — ONDANSETRON 4 MG PO TBDP: 4.0000 mg | ORAL_TABLET | Freq: Three times a day (TID) | ORAL | 1 refills | Status: DC | PRN

## 2017-09-09 MED ORDER — TRAMADOL HCL 50 MG PO TABS
50.0000 mg | ORAL_TABLET | Freq: Two times a day (BID) | ORAL | 0 refills | Status: AC | PRN
Start: 2017-09-09 — End: 2017-10-07

## 2017-09-09 MED ORDER — PREDNISONE 50 MG PO TABS: 50.0000 mg | ORAL_TABLET | Freq: Every day | ORAL | 0 refills | Status: DC

## 2017-09-09 NOTE — Progress Notes (Signed)
BP 101/64   Pulse (!) 116   Temp 98.6 F (37 C) (Oral)   Wt 176 lb 1.6 oz (79.9 kg)   SpO2 92%   BMI 25.27 kg/m    Subjective:    Patient ID: Devin Bryant Lipschutz, male    DOB: 1955-07-18, 62 y.o.   MRN: [id number]  HPI: Devin Bryant Scialdone is a 62 y.o. male  Chief Complaint  Patient presents with  . Pain  . Follow-up    medications   Has been having a lot of swelling in his legs for about 6 weeks, his belly is also very swollen. He has been sleeping on the couch and seems to be keeping his feet dependent most of the time. He had a paracentesis about a month ago, but seems to be steadily regaining fluid since then. He has been seeing Dr. Tobi BastosAnna, who was quite open with him that this will continue to happen. He is aware. He is still drinking and does not want to stop. He would like to sign a DNR today- has a living will, which is now on file in the office, but his wife is hesitant and would like to discuss it a bit more before signing any paperwork. He has been falling a lot at home. His neuropathy is about the same. Unclear if his lyrica is helping at all. His tramadol helps with the pain and keeps him stable. He has a lot of trouble getting around and would like a wheelchair to make this easier. He also has lost most of his strength physically. Would like to do some PT to try to help with strength, but unsure if it will help. Last time, they were very concerned about his heart rate and would not do it. Would also like to get a hospital bed as he cannot get upstairs and cannot keep his feet elevated with the couch.   UPPER RESPIRATORY TRACT INFECTION- HAs been having congestion Duration: 4-5 days Worst symptom: congestion Fever: no Cough: yes Shortness of breath: yes Wheezing: yes Chest pain: no Chest tightness: no Chest congestion: yes Nasal congestion: no Runny nose: no Post nasal drip: no Sneezing: yes Sore throat: no Swollen glands: no Sinus pressure: no Headache: no Face pain:  no Toothache: no Ear pain: no  Ear pressure:  no Eyes red/itching:no Eye drainage/crusting: no  Vomiting: no Rash: no Fatigue: yes Sick contacts: no Strep contacts: no  Context: stable Recurrent sinusitis: no Relief with OTC cold/cough medications: no  Treatments attempted: flonase and anti-histamine   CHRONIC PAIN  Present dose: 20 Morphine equivalents Pain control status: stable Duration: chronic Location: bilateral feet Quality: numb and tingling Current Pain Level: severe Previous Pain Level: moderate Breakthrough pain: yes Benefit from narcotic medications: yes What Activities task can be accomplished with current medication? Interested in weaning off narcotics:no   Stool softners/OTC fiber: no  Previous pain specialty evaluation: no Non-narcotic analgesic meds: yes Narcotic contract: yes  HYPERTENSION / HYPERLIPIDEMIA Satisfied with current treatment? yes Duration of hypertension: chronic BP monitoring frequency: not checking BP medication side effects: no Past BP meds: lasix, spironalactone Duration of hyperlipidemia: chronic Cholesterol medication side effects: Not on anything Cholesterol supplements: none Medication compliance: good compliance Aspirin: no Recent stressors: yes Recurrent headaches: no Visual changes: no Palpitations: no Dyspnea: no Chest pain: no Lower extremity edema: yes Dizzy/lightheaded: yes  Relevant past medical, surgical, family and social history reviewed and updated as indicated. Interim medical history since our last visit reviewed. Allergies and medications reviewed and  updated.  Review of Systems  Constitutional: Negative.   Respiratory: Negative.   Cardiovascular: Positive for leg swelling. Negative for chest pain and palpitations.  Gastrointestinal: Positive for abdominal distention. Negative for abdominal pain, anal bleeding, blood in stool, constipation, diarrhea, nausea, rectal pain and vomiting.  Skin: Negative.    Neurological: Negative.   Psychiatric/Behavioral: Negative.     Per HPI unless specifically indicated above     Objective:    BP 101/64   Pulse (!) 116   Temp 98.6 F (37 C) (Oral)   Wt 176 lb 1.6 oz (79.9 kg)   SpO2 92%   BMI 25.27 kg/m   Wt Readings from Last 3 Encounters:  09/09/17 176 lb 1.6 oz (79.9 kg)  08/14/17 157 lb 6.4 oz (71.4 kg)  06/18/17 150 lb 2 oz (68.1 kg)    Physical Exam  Constitutional: He is oriented to person, place, and time. He appears well-developed and well-nourished. No distress.  HENT:  Head: Normocephalic and atraumatic.  Right Ear: Hearing normal.  Left Ear: Hearing normal.  Nose: Nose normal.  Eyes: Conjunctivae and lids are normal. Right eye exhibits no discharge. Left eye exhibits no discharge. No scleral icterus.  Cardiovascular: Normal rate, regular rhythm, normal heart sounds and intact distal pulses. Exam reveals no gallop and no friction rub.  No murmur heard. Pulmonary/Chest: Effort normal and breath sounds normal. No stridor. No respiratory distress. He has no wheezes. He has no rales. He exhibits no tenderness.  Abdominal: Bowel sounds are normal. He exhibits distension. He exhibits no mass. There is no tenderness. There is no rebound and no guarding. No hernia.  Musculoskeletal: Normal range of motion. He exhibits edema (3+ edema bilaterally). He exhibits no tenderness or deformity.  Neurological: He is alert and oriented to person, place, and time.  Skin: Skin is warm, dry and intact. Capillary refill takes less than 2 seconds. No rash noted. He is not diaphoretic. No erythema. No pallor.  Psychiatric: He has a normal mood and affect. His speech is normal and behavior is normal. Judgment and thought content normal. Cognition and memory are normal.  Nursing note and vitals reviewed.   Results for orders placed or performed in visit on 09/09/17  CBC with Differential/Platelet  Result Value Ref Range   WBC 8.3 3.4 - 10.8 x10E3/uL    RBC 2.50 (LL) 4.14 - 5.80 x10E6/uL   Hemoglobin 9.8 (L) 13.0 - 17.7 g/dL   Hematocrit 98.1 (L) 19.1 - 51.0 %   MCV 121 (H) 79 - 97 fL   MCH 39.2 (H) 26.6 - 33.0 pg   MCHC 32.5 31.5 - 35.7 g/dL   RDW 47.8 29.5 - 62.1 %   Platelets 120 (L) 150 - 450 x10E3/uL   Neutrophils 68 Not Estab. %   Lymphs 9 Not Estab. %   Monocytes 18 Not Estab. %   Eos 2 Not Estab. %   Basos 1 Not Estab. %   Neutrophils Absolute 5.8 1.4 - 7.0 x10E3/uL   Lymphocytes Absolute 0.7 0.7 - 3.1 x10E3/uL   Monocytes Absolute 1.5 (H) 0.1 - 0.9 x10E3/uL   EOS (ABSOLUTE) 0.1 0.0 - 0.4 x10E3/uL   Basophils Absolute 0.1 0.0 - 0.2 x10E3/uL   Immature Granulocytes 2 Not Estab. %   Immature Grans (Abs) 0.1 0.0 - 0.1 x10E3/uL   Hematology Comments: Note:   Comprehensive metabolic panel  Result Value Ref Range   Glucose 89 65 - 99 mg/dL   BUN 16 8 - 27 mg/dL  Creatinine, Ser 2.13 (H) 0.76 - 1.27 mg/dL   GFR calc non Af Amer 32 (L) >59 mL/min/1.73   GFR calc Af Amer 37 (L) >59 mL/min/1.73   BUN/Creatinine Ratio 8 (L) 10 - 24   Sodium 142 134 - 144 mmol/L   Potassium 4.3 3.5 - 5.2 mmol/L   Chloride 103 96 - 106 mmol/L   CO2 21 20 - 29 mmol/L   Calcium 8.2 (L) 8.6 - 10.2 mg/dL   Total Protein 7.1 6.0 - 8.5 g/dL   Albumin 2.8 (L) 3.6 - 4.8 g/dL   Globulin, Total 4.3 1.5 - 4.5 g/dL   Albumin/Globulin Ratio 0.7 (L) 1.2 - 2.2   Bilirubin Total 2.0 (H) 0.0 - 1.2 mg/dL   Alkaline Phosphatase 264 (H) 39 - 117 IU/L   AST 121 (H) 0 - 40 IU/L   ALT 39 0 - 44 IU/L  Lipid Panel w/o Chol/HDL Ratio  Result Value Ref Range   Cholesterol, Total 154 100 - 199 mg/dL   Triglycerides 295 (H) 0 - 149 mg/dL   HDL 13 (L) >62 mg/dL   VLDL Cholesterol Cal 36 5 - 40 mg/dL   LDL Calculated 130 (H) 0 - 99 mg/dL  Q65 and Folate Panel  Result Value Ref Range   Vitamin B-12 >2000 (H) 232 - 1245 pg/mL   Folate 13.0 >3.0 ng/mL  B Nat Peptide  Result Value Ref Range   BNP 55.0 0.0 - 100.0 pg/mL      Assessment & Plan:   Problem List  Items Addressed This Visit      Cardiovascular and Mediastinum   Hypertension - Primary    Under good control today. Only on diuretics. Call with any concerns. Checking labs today. Await results.       Relevant Medications   spironolactone (ALDACTONE) 100 MG tablet   furosemide (LASIX) 40 MG tablet   Other Relevant Orders   Comprehensive metabolic panel (Completed)     Digestive   Alcoholic hepatitis with ascites    Has reaccumulated. Will get him back in touch with GI to get repeat paracentesis. Rechecking levels today. Continue to monitor.       Liver cirrhosis, alcoholic (HCC)    Has reaccumulated. Will get him back in touch with GI to get repeat paracentesis. Rechecking levels today. Continue to monitor.       Relevant Medications   spironolactone (ALDACTONE) 100 MG tablet   furosemide (LASIX) 40 MG tablet     Nervous and Auditory   Neuropathy    Stable. Continue current regimen. Continue to monitor. Call with any concerns.       Alcohol-induced polyneuropathy (HCC)    Stable on current regimen. Will continue the tramadol BID and will continue lyrica and add amitriptyline. Recheck 3 months.       Relevant Medications   pregabalin (LYRICA) 150 MG capsule   mirtazapine (REMERON) 30 MG tablet   escitalopram (LEXAPRO) 10 MG tablet   amitriptyline (ELAVIL) 25 MG tablet     Other   Hypertriglyceridemia    Rechecking levels today. Await results. Call with any concerns.       Relevant Medications   spironolactone (ALDACTONE) 100 MG tablet   furosemide (LASIX) 40 MG tablet   Other Relevant Orders   Comprehensive metabolic panel (Completed)   Lipid Panel w/o Chol/HDL Ratio (Completed)   Elevated serum glutamic pyruvic transaminase (SGPT) level    Rechecking levels today. Await results. Call with any concerns.  Macrocytosis without anemia    Rechecking levels today. Await results. Call with any concerns.       Relevant Orders   CBC with Differential/Platelet  (Completed)   B12 and Folate Panel (Completed)   Protein-calorie malnutrition, severe    Rechecking levels today. Await results. Call with any concerns.       Relevant Orders   Comprehensive metabolic panel (Completed)   Advance directive discussed with patient    Living will on file. Patient would like to sign DNR/MOST form- but will discuss it further with his wife and we will discuss it further at his follow up appointment.       Thrombocytopenia (HCC)    Rechecking levels today. Await results. Call with any concerns.       Relevant Orders   CBC with Differential/Platelet (Completed)   B12 and Folate Panel (Completed)    Other Visit Diagnoses    Swelling       Will check BNP and treat with lasix x 3 days until we can get him in to have a repeat paracentesis. Will also get him compression stockings and a hospital bed.    Relevant Orders   B Nat Peptide (Completed)   Viral upper respiratory tract infection       Will treat with prednisone burst. Call with any concerns or if not getting better.        Follow up plan: Return in about 1 month (around 10/07/2017).

## 2017-09-10 ENCOUNTER — Telehealth: Payer: Self-pay | Admitting: Family Medicine

## 2017-09-10 DIAGNOSIS — N289 Disorder of kidney and ureter, unspecified: Secondary | ICD-10-CM

## 2017-09-10 LAB — COMPREHENSIVE METABOLIC PANEL
ALBUMIN: 2.8 g/dL — AB (ref 3.6–4.8)
ALK PHOS: 264 IU/L — AB (ref 39–117)
ALT: 39 IU/L (ref 0–44)
AST: 121 IU/L — ABNORMAL HIGH (ref 0–40)
Albumin/Globulin Ratio: 0.7 — ABNORMAL LOW (ref 1.2–2.2)
BUN / CREAT RATIO: 8 — AB (ref 10–24)
BUN: 16 mg/dL (ref 8–27)
Bilirubin Total: 2 mg/dL — ABNORMAL HIGH (ref 0.0–1.2)
CALCIUM: 8.2 mg/dL — AB (ref 8.6–10.2)
CO2: 21 mmol/L (ref 20–29)
CREATININE: 2.13 mg/dL — AB (ref 0.76–1.27)
Chloride: 103 mmol/L (ref 96–106)
GFR calc Af Amer: 37 mL/min/{1.73_m2} — ABNORMAL LOW (ref >59)
GFR, EST NON AFRICAN AMERICAN: 32 mL/min/{1.73_m2} — AB (ref >59)
GLOBULIN, TOTAL: 4.3 g/dL (ref 1.5–4.5)
Glucose: 89 mg/dL (ref 65–99)
POTASSIUM: 4.3 mmol/L (ref 3.5–5.2)
SODIUM: 142 mmol/L (ref 134–144)
Total Protein: 7.1 g/dL (ref 6.0–8.5)

## 2017-09-10 LAB — CBC WITH DIFFERENTIAL/PLATELET
BASOS: 1 %
Basophils Absolute: 0.1 10*3/uL (ref 0.0–0.2)
EOS (ABSOLUTE): 0.1 10*3/uL (ref 0.0–0.4)
Eos: 2 %
HEMATOCRIT: 30.2 % — AB (ref 37.5–51.0)
Hemoglobin: 9.8 g/dL — ABNORMAL LOW (ref 13.0–17.7)
IMMATURE GRANULOCYTES: 2 %
Immature Grans (Abs): 0.1 10*3/uL (ref 0.0–0.1)
Lymphocytes Absolute: 0.7 10*3/uL (ref 0.7–3.1)
Lymphs: 9 %
MCH: 39.2 pg — ABNORMAL HIGH (ref 26.6–33.0)
MCHC: 32.5 g/dL (ref 31.5–35.7)
MCV: 121 fL — ABNORMAL HIGH (ref 79–97)
MONOS ABS: 1.5 10*3/uL — AB (ref 0.1–0.9)
Monocytes: 18 %
NEUTROS ABS: 5.8 10*3/uL (ref 1.4–7.0)
NEUTROS PCT: 68 %
Platelets: 120 10*3/uL — ABNORMAL LOW (ref 150–450)
RBC: 2.5 x10E6/uL — CL (ref 4.14–5.80)
RDW: 15 % (ref 12.3–15.4)
WBC: 8.3 10*3/uL (ref 3.4–10.8)

## 2017-09-10 LAB — LIPID PANEL W/O CHOL/HDL RATIO
Cholesterol, Total: 154 mg/dL (ref 100–199)
HDL: 13 mg/dL — ABNORMAL LOW (ref >39)
LDL CALC: 105 mg/dL — AB (ref 0–99)
Triglycerides: 179 mg/dL — ABNORMAL HIGH (ref 0–149)
VLDL Cholesterol Cal: 36 mg/dL (ref 5–40)

## 2017-09-10 LAB — B12 AND FOLATE PANEL
Folate: 13 ng/mL (ref >3.0)
Vitamin B-12: >2000 pg/mL — ABNORMAL HIGH (ref 232–1245)

## 2017-09-10 LAB — BRAIN NATRIURETIC PEPTIDE: BNP: 55 pg/mL (ref 0.0–100.0)

## 2017-09-10 MED ORDER — FERROUS SULFATE 325 (65 FE) MG PO TABS: 325.0000 mg | ORAL_TABLET | Freq: Every day | ORAL | 3 refills | Status: DC

## 2017-09-10 MED ORDER — TRAMADOL HCL 50 MG PO TABS: 50.0000 mg | ORAL_TABLET | Freq: Two times a day (BID) | ORAL | 0 refills | Status: DC | PRN

## 2017-09-10 NOTE — Telephone Encounter (Signed)
I called patient about results. AKI- will go back to normal dose of lasix and continue lactulose. Anemic. Calling in iron. B12 normal. BNP normal. Will get him back in in a couple of days to recheck kidney function. Call with any concerns. Continue to monitor.   Can we please check with Dr. Tobi BastosAnna about getting a paracentesis on Devin Bryant?

## 2017-09-11 ENCOUNTER — Encounter: Payer: Self-pay | Admitting: Family Medicine

## 2017-09-11 ENCOUNTER — Telehealth: Payer: Self-pay | Admitting: Gastroenterology

## 2017-09-11 NOTE — Assessment & Plan Note (Signed)
Stable. Continue current regimen. Continue to monitor. Call with any concerns.  

## 2017-09-11 NOTE — Assessment & Plan Note (Signed)
Under good control today. Only on diuretics. Call with any concerns. Checking labs today. Await results.

## 2017-09-11 NOTE — Assessment & Plan Note (Signed)
Rechecking levels today. Await results. Call with any concerns.  

## 2017-09-11 NOTE — Assessment & Plan Note (Signed)
Has reaccumulated. Will get him back in touch with GI to get repeat paracentesis. Rechecking levels today. Continue to monitor.

## 2017-09-11 NOTE — Telephone Encounter (Signed)
I gave Devin Bryant the results yesterday- can you see what she questions she has? I'll be happy to talk to her.   His kidney function is not good- has some kidney injury right now (likely because of the liver) So we are keeping on his regular dose of lasix- not to take higher dose again, and have him come in in the next couple of days for a repeat BMP (order is in- only lab, not visit)  He is anemic- but his B12 level is good. It's probably also from his liver, so we're starting an iron supplement- it's at the pharmacy.   His markers for heart failure was normal, so I think this is all coming from his liver, not his heart.

## 2017-09-11 NOTE — Telephone Encounter (Signed)
Dr. Tobi BastosAnna,  I returned patients call and he has requested to have a paracentesis.  He also would like for me to make you aware that is kidney function is very bad.  Please advise regarding order for paracentesis.  Thanks [employer]

## 2017-09-11 NOTE — Telephone Encounter (Signed)
Wife would like someone to call her back in regards to lab results. Please advise

## 2017-09-11 NOTE — Telephone Encounter (Signed)
Can have paracentesis- if kidneys are bad then needs 25 grams of albumin for any amount of fluid taken out

## 2017-09-11 NOTE — Assessment & Plan Note (Signed)
Has reaccumulated. Will get him back in touch with GI to get repeat paracentesis. Rechecking levels today. Continue to monitor.  

## 2017-09-11 NOTE — Telephone Encounter (Signed)
Pt wife is calling to schedule Parathetisis pt has fluid pt been trying to reach us for weeks Dr.  Olevia PerchesMegan Johnson been trying to reach  Dr. Tobi BastosAnna  As well They did  Lab work and pt Kidney function is very bad. Please call pt

## 2017-09-11 NOTE — Telephone Encounter (Signed)
Discussed lab results with patient's wife.

## 2017-09-11 NOTE — Assessment & Plan Note (Signed)
Living will on file. Patient would like to sign DNR/MOST form- but will discuss it further with his wife and we will discuss it further at his follow up appointment.

## 2017-09-11 NOTE — Assessment & Plan Note (Signed)
Stable on current regimen. Will continue the tramadol BID and will continue lyrica and add amitriptyline. Recheck 3 months.

## 2017-09-12 ENCOUNTER — Other Ambulatory Visit: Payer: Self-pay

## 2017-09-12 ENCOUNTER — Telehealth: Payer: Self-pay | Admitting: Gastroenterology

## 2017-09-12 DIAGNOSIS — K7031 Alcoholic cirrhosis of liver with ascites: Secondary | ICD-10-CM

## 2017-09-12 NOTE — Telephone Encounter (Signed)
[facility]  CALLED TO SCHEDULE pARATHENMTISIS FOR 09/13/17 AT 2  O CLOCK [facility]

## 2017-09-12 NOTE — Telephone Encounter (Signed)
Patient has been informed to [facility] at 2pm tomorrow.

## 2017-09-13 ENCOUNTER — Ambulatory Visit
Admission: RE | Admit: 2017-09-13 | Discharge: 2017-09-13 | Disposition: A | Discharge status: Home / Self Care | Payer: BLUE CROSS/BLUE SHIELD | Source: Ambulatory Visit | Attending: Gastroenterology | Admitting: Gastroenterology

## 2017-09-13 ENCOUNTER — Other Ambulatory Visit: Payer: Self-pay

## 2017-09-13 ENCOUNTER — Other Ambulatory Visit: Payer: BLUE CROSS/BLUE SHIELD

## 2017-09-13 DIAGNOSIS — K7031 Alcoholic cirrhosis of liver with ascites: Secondary | ICD-10-CM | POA: Insufficient documentation

## 2017-09-13 DIAGNOSIS — N289 Disorder of kidney and ureter, unspecified: Secondary | ICD-10-CM

## 2017-09-13 MED ORDER — ALBUMIN HUMAN 25 % IV SOLN
25.0000 g | Freq: Once | INTRAVENOUS | Status: AC
  Administered 2017-09-13: 25 g via INTRAVENOUS

## 2017-09-13 MED ORDER — ALBUMIN HUMAN 25 % IV SOLN
INTRAVENOUS | Status: AC
  Administered 2017-09-13: 25 g via INTRAVENOUS
  Filled 2017-09-13: qty 100

## 2017-09-13 NOTE — Telephone Encounter (Signed)
Notified patient yesterday.

## 2017-09-14 LAB — BASIC METABOLIC PANEL
BUN/Creatinine Ratio: 12 (ref 10–24)
BUN: 25 mg/dL (ref 8–27)
CALCIUM: 8.2 mg/dL — AB (ref 8.6–10.2)
CO2: 17 mmol/L — ABNORMAL LOW (ref 20–29)
CREATININE: 2.1 mg/dL — AB (ref 0.76–1.27)
Chloride: 101 mmol/L (ref 96–106)
GFR, EST AFRICAN AMERICAN: 37 mL/min/{1.73_m2} — AB (ref >59)
GFR, EST NON AFRICAN AMERICAN: 32 mL/min/{1.73_m2} — AB (ref >59)
Glucose: 101 mg/dL — ABNORMAL HIGH (ref 65–99)
Potassium: 5 mmol/L (ref 3.5–5.2)
Sodium: 138 mmol/L (ref 134–144)

## 2017-09-15 ENCOUNTER — Telehealth: Payer: Self-pay | Admitting: Family Medicine

## 2017-09-15 DIAGNOSIS — N289 Disorder of kidney and ureter, unspecified: Secondary | ICD-10-CM

## 2017-09-15 DIAGNOSIS — K7031 Alcoholic cirrhosis of liver with ascites: Secondary | ICD-10-CM

## 2017-09-15 DIAGNOSIS — E43 Unspecified severe protein-calorie malnutrition: Secondary | ICD-10-CM

## 2017-09-15 NOTE — Telephone Encounter (Signed)
Please let him know that his kidney function is about the same. Since we can't really take him off his lasix, which would help it because of his swelling, I'd like him to see the kidney doctor to see if there is something they recommend.

## 2017-09-16 ENCOUNTER — Telehealth: Payer: Self-pay | Admitting: Family Medicine

## 2017-09-16 NOTE — Telephone Encounter (Signed)
Copied from CRM [phone] [id number]. Topic: Inquiry >> Sep 16, 2017  9:35 AM Maia Pettiesrtiz, Kristie S wrote: Reason for CRM: pt wife Dois DavenportSandra called about lab results given to pt. She has some questions and would like to speak to nurse/CMA. Please call back.

## 2017-09-16 NOTE — Telephone Encounter (Signed)
Message relayed to patient. Verbalized understanding and denied questions.  Pt open to seeing nephrology.

## 2017-09-17 NOTE — Telephone Encounter (Signed)
Wife was in office to see Dr. Dossie Arbourrissman. Message was relayed.

## 2017-09-17 NOTE — Telephone Encounter (Signed)
Please let wife know same information we gave him.   Please let him know that his kidney function is about the same. Since we can't really take him off his lasix, which would help it because of his swelling, I'd like him to see the kidney doctor to see if there is something they recommend.

## 2017-09-23 ENCOUNTER — Ambulatory Visit: Payer: BLUE CROSS/BLUE SHIELD | Admitting: Family Medicine

## 2017-09-27 ENCOUNTER — Ambulatory Visit: Payer: Self-pay

## 2017-09-27 NOTE — Telephone Encounter (Signed)
Pt. called to report increased swelling and redness of feet and legs.  Reported the swelling has worsened over the past 3-4 weeks; was involving feet only and has progressed up the legs to the hips, bilaterally.  Noticed redness of his feet starting yesterday, and now the redness  has progressed up, 1/2 way to the calf.  Denied any open sores/drainage on lower extremities.  Denied fever, but stated the feet and lower legs are both red and warm. Also, reported his abdomen is very distended and tight like a drum.  Stated he had a Paracentesis about 2 weeks ago and 6 liters of fluid had been pulled off, but now is filling back up.  Denied shortness of breath.   Called to office to make Dr. Laural BenesJohnson aware.  Will send pt. To the ER.    Advised pt. he will need to go to the ER for treatment with IV medication, due to the severity of his edema.  Pt. verb. understanding.  Agreed with recommendation.      Reason for Disposition . SEVERE leg swelling (e.g., swelling extends above knee, entire leg is swollen, weeping fluid)  Answer Assessment - Initial Assessment Questions 1. ONSET: "When did the swelling start?" (e.g., minutes, hours, days)     3-4 weeks ago 2. LOCATION: "What part of the leg is swollen?"  "Are both legs swollen or just one leg?"     Feet up to hips 3. SEVERITY: "How bad is the swelling?" (e.g., localized; mild, moderate, severe)  - Localized - small area of swelling localized to one leg  - MILD pedal edema - swelling limited to foot and ankle, pitting edema < 1/4 inch (6 mm) deep, rest and elevation eliminate most or all swelling  - MODERATE edema - swelling of lower leg to knee, pitting edema > 1/4 inch (6 mm) deep, rest and elevation only partially reduce swelling  - SEVERE edema - swelling extends above knee, facial or hand swelling present      severe 4. REDNESS: "Does the swelling look red or infected?"     Redness, warmth in both feet and up 1/2 way to calves bilateral  5. PAIN:  "Is the swelling painful to touch?" If so, ask: "How painful is it?"   (Scale 1-10; mild, moderate or severe)     Denied pain 6. FEVER: "Do you have a fever?" If so, ask: "What is it, how was it measured, and when did it start?"      None known 7. CAUSE: "What do you think is causing the leg swelling?"     Unknown; has been going on for 3-4 weeks 8. MEDICAL HISTORY: "Do you have a history of heart failure, kidney disease, liver failure, or cancer?"     COPD, Kidney disease  9. RECURRENT SYMPTOM: "Have you had leg swelling before?" If so, ask: "When was the last time?" "What happened that time?"     This is a new development 10. OTHER SYMPTOMS: "Do you have any other symptoms?" (e.g., chest pain, difficulty breathing)       Denied shortness of breath; stomach feels bloated; tight like a drum      Reported had a paracentesis 2 weeks ago, reported 6 liters fluid was       pulled off  Protocols used: LEG SWELLING AND EDEMA-A-AH

## 2017-09-28 ENCOUNTER — Encounter: Payer: Self-pay | Admitting: Emergency Medicine

## 2017-09-28 ENCOUNTER — Emergency Department: Payer: BLUE CROSS/BLUE SHIELD

## 2017-09-28 ENCOUNTER — Other Ambulatory Visit: Payer: Self-pay

## 2017-09-28 ENCOUNTER — Emergency Department
Admission: EM | Admit: 2017-09-28 | Discharge: 2017-09-28 | Disposition: A | Discharge status: Home / Self Care | Payer: BLUE CROSS/BLUE SHIELD | Attending: Emergency Medicine | Admitting: Emergency Medicine

## 2017-09-28 DIAGNOSIS — L0390 Cellulitis, unspecified: Secondary | ICD-10-CM | POA: Diagnosis not present

## 2017-09-28 DIAGNOSIS — I10 Essential (primary) hypertension: Secondary | ICD-10-CM | POA: Insufficient documentation

## 2017-09-28 DIAGNOSIS — J449 Chronic obstructive pulmonary disease, unspecified: Secondary | ICD-10-CM | POA: Insufficient documentation

## 2017-09-28 DIAGNOSIS — R600 Localized edema: Secondary | ICD-10-CM | POA: Diagnosis present

## 2017-09-28 DIAGNOSIS — Z79899 Other long term (current) drug therapy: Secondary | ICD-10-CM | POA: Diagnosis not present

## 2017-09-28 DIAGNOSIS — F17210 Nicotine dependence, cigarettes, uncomplicated: Secondary | ICD-10-CM | POA: Insufficient documentation

## 2017-09-28 DIAGNOSIS — R609 Edema, unspecified: Secondary | ICD-10-CM

## 2017-09-28 LAB — CBC WITH DIFFERENTIAL/PLATELET
BASOS PCT: 1 %
Basophils Absolute: 0.1 10*3/uL (ref 0–0.1)
EOS ABS: 0.3 10*3/uL (ref 0–0.7)
EOS PCT: 3 %
HCT: 31.9 % — ABNORMAL LOW (ref 40.0–52.0)
Hemoglobin: 11.1 g/dL — ABNORMAL LOW (ref 13.0–18.0)
LYMPHS ABS: 1 10*3/uL (ref 1.0–3.6)
Lymphocytes Relative: 10 %
MCH: 40.8 pg — AB (ref 26.0–34.0)
MCHC: 34.6 g/dL (ref 32.0–36.0)
MCV: 117.9 fL — ABNORMAL HIGH (ref 80.0–100.0)
MONO ABS: 1 10*3/uL (ref 0.2–1.0)
Monocytes Relative: 10 %
Neutro Abs: 7.5 10*3/uL — ABNORMAL HIGH (ref 1.4–6.5)
Neutrophils Relative %: 76 %
Platelets: 123 10*3/uL — ABNORMAL LOW (ref 150–440)
RBC: 2.71 MIL/uL — ABNORMAL LOW (ref 4.40–5.90)
RDW: 14.8 % — AB (ref 11.5–14.5)
WBC: 9.9 10*3/uL (ref 3.8–10.6)

## 2017-09-28 LAB — COMPREHENSIVE METABOLIC PANEL
ALBUMIN: 2.7 g/dL — AB (ref 3.5–5.0)
ALT: 25 U/L (ref 0–44)
ANION GAP: 12 (ref 5–15)
AST: 77 U/L — ABNORMAL HIGH (ref 15–41)
Alkaline Phosphatase: 214 U/L — ABNORMAL HIGH (ref 38–126)
BUN: 14 mg/dL (ref 8–23)
CO2: 25 mmol/L (ref 22–32)
Calcium: 8.5 mg/dL — ABNORMAL LOW (ref 8.9–10.3)
Chloride: 101 mmol/L (ref 98–111)
Creatinine, Ser: 1.89 mg/dL — ABNORMAL HIGH (ref 0.61–1.24)
GFR calc non Af Amer: 36 mL/min — ABNORMAL LOW (ref >60)
GFR, EST AFRICAN AMERICAN: 42 mL/min — AB (ref >60)
Glucose, Bld: 93 mg/dL (ref 70–99)
POTASSIUM: 3.6 mmol/L (ref 3.5–5.1)
SODIUM: 138 mmol/L (ref 135–145)
TOTAL PROTEIN: 7.2 g/dL (ref 6.5–8.1)
Total Bilirubin: 2 mg/dL — ABNORMAL HIGH (ref 0.3–1.2)

## 2017-09-28 LAB — TROPONIN I

## 2017-09-28 LAB — BRAIN NATRIURETIC PEPTIDE: B Natriuretic Peptide: 59 pg/mL (ref 0.0–100.0)

## 2017-09-28 MED ORDER — LORAZEPAM 2 MG/ML IJ SOLN
INTRAMUSCULAR | Status: AC
  Filled 2017-09-28: qty 1

## 2017-09-28 MED ORDER — POTASSIUM CHLORIDE CRYS ER 20 MEQ PO TBCR
20.0000 meq | EXTENDED_RELEASE_TABLET | Freq: Once | ORAL | Status: AC
  Administered 2017-09-28: 20 meq via ORAL
  Filled 2017-09-28: qty 1

## 2017-09-28 MED ORDER — VANCOMYCIN HCL IN DEXTROSE 1-5 GM/200ML-% IV SOLN
1000.0000 mg | Freq: Once | INTRAVENOUS | Status: AC
  Administered 2017-09-28: 1000 mg via INTRAVENOUS
  Filled 2017-09-28: qty 200

## 2017-09-28 MED ORDER — FUROSEMIDE 10 MG/ML IJ SOLN
60.0000 mg | Freq: Once | INTRAMUSCULAR | Status: AC
  Administered 2017-09-28: 60 mg via INTRAVENOUS
  Filled 2017-09-28: qty 8

## 2017-09-28 MED ORDER — LORAZEPAM 2 MG/ML IJ SOLN
1.0000 mg | Freq: Once | INTRAMUSCULAR | Status: AC
  Administered 2017-09-28: 1 mg via INTRAVENOUS

## 2017-09-28 MED ORDER — CEPHALEXIN 500 MG PO CAPS: 500.0000 mg | ORAL_CAPSULE | Freq: Four times a day (QID) | ORAL | 0 refills | Status: AC

## 2017-09-28 MED ORDER — CEPHALEXIN 500 MG PO CAPS: 500.0000 mg | ORAL_CAPSULE | Freq: Four times a day (QID) | ORAL | 0 refills | Status: DC

## 2017-09-28 NOTE — ED Notes (Signed)
Noted gross  tremors  In patients arms, pt states he usually drinks ETOH all day every day. Informed Derrill KayGoodman and request ativan order.

## 2017-09-28 NOTE — ED Notes (Signed)
Pt assisted to toilet, with wife at bedside, output charted, pt very unsteady and tremulous

## 2017-09-28 NOTE — ED Notes (Signed)
Left wrist iv appears mal-postioned, will attempt another

## 2017-09-28 NOTE — Discharge Instructions (Addendum)
Please seek medical attention for any high fevers, chest pain, shortness of breath, change in behavior, persistent vomiting, bloody stool or any other new or concerning symptoms.  

## 2017-09-28 NOTE — ED Notes (Signed)
Peripheral IV discontinued. Catheter intact. No signs of infiltration or redness. Gauze applied to IV site.   Discharge instructions reviewed with patient. Questions fielded by this RN. Patient verbalizes understanding of instructions. Patient discharged home in stable condition per Goodman. No acute distress noted at time of discharge.   

## 2017-09-28 NOTE — ED Notes (Signed)
ED Provider at bedside. 

## 2017-09-28 NOTE — ED Triage Notes (Signed)
Pt to ED via POV c/o bilateral lower extremity edema. Pt states that this has been going on for a few weeks but has progressively gotten worse. Pt also has redness in both leg and warmth. No fluid leakage noted at this time. Pt has positive pulses present in both feet.   Pt also noted to have abdominal distention. Pts abd is distention and firm. Pt states that he had a paracentesis done 2 weeks ago and they drained 6 liters off of his abdomen. Pt is currently tachycardic but states that this is his baseline HR due to his COPD medications.

## 2017-09-28 NOTE — ED Provider Notes (Signed)
[facility] Emergency Department Provider Note  ____________________________________________   I have reviewed the triage vital signs and the nursing notes.   HISTORY  Chief Complaint Leg Swelling   History limited by: Not Limited   HPI Devin Bryant is a 62 y.o. male who presents to the emergency department today because of concerns for bilateral lower extremity swelling.  The swelling has been present for the past roughly 8 to 10 weeks.  Has been getting worse recently.  For the past couple days he is also started noticing some redness.  Initially started on tops of his feet now incorporates his calf.  Patient denies any pain although has neuropathy and states he is not sure if he be able to feel the pain.  He denies any chest pain or shortness of breath.  Also has some abdominal distention.  Patient has a history of liver cirrhosis.  Per medical record review patient has a history of alcohol abuse, liver disease  Past Medical History:  Diagnosis Date  . Adjustment disorder with depressed mood   . Alcohol abuse 01/17/2016  . Allergy   . Anxiety   . Cellulitis of arm, left 08/18/2016  . COPD (chronic obstructive pulmonary disease) ([place])   . ED (erectile dysfunction)   . Elevated glucose   . Elevated serum glutamic pyruvic transaminase (SGPT) level   . Emphysema lung ([place])   . GERD (gastroesophageal reflux disease)   . Hypertension   . Hypertriglyceridemia   . Liver disease   . Neuropathy   . Tobacco use     Patient Active Problem List   Diagnosis Date Noted  . Thrombocytopenia ([place]) 06/18/2017  . Advance directive discussed with patient 02/18/2017  . GERD (gastroesophageal reflux disease) 01/23/2017  . Tachycardia 01/18/2017  . Protein-calorie malnutrition, severe 12/18/2016  . Olecranon bursitis 04/19/2016  . Pain in elbow 04/19/2016  . Neuropathy 01/30/2016  . Acute on chronic respiratory failure with hypoxia and hypercapnia ([place])  01/17/2016  . Nonsustained ventricular tachycardia ([place]) 01/17/2016  . Leukocytosis 01/17/2016  . History of tobacco abuse 01/17/2016  . Liver cirrhosis, alcoholic ([place]) 71/08/2692  . 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 Alcoholic hepatitis with ascites 10/10/2015  . Encephalopathy, hepatic ([place]) 10/10/2015  . Hypokalemia 10/10/2015  . Psoriasis 10/10/2015  . Alcohol dependence with withdrawal with complication ([place])   . Macrocytosis without anemia 04/11/2015  . Other specified diseases of blood and blood-forming organs 04/11/2015  . Tobacco abuse 10/19/2014  . Generalized anxiety disorder 10/17/2014  . Allergy   . Hypertension   . COPD (chronic obstructive pulmonary disease) ([place])   . ED (erectile dysfunction)   . Hypertriglyceridemia   . Elevated serum glutamic pyruvic transaminase (SGPT) level     Past Surgical History:  Procedure Laterality Date  . EYE SURGERY     cataract surgery  . KNEE SURGERY     arthroscopic  . SEPTOPLASTY  Feb 2016  . TONSILLECTOMY      Prior to Admission medications   Medication Sig Start Date End Date Taking? Authorizing Provider  ADVAIR HFA 115-21 MCG/ACT inhaler Inhale 2 puffs into the lungs 2 (two) times daily. 09/09/17   Johnson, Megan P, DO  albuterol (PROVENTIL HFA;VENTOLIN HFA) 108 (90 Base) MCG/ACT inhaler Inhale 2 puffs into the lungs every 6 (six) hours as needed for wheezing or shortness of breath. 09/09/17   Johnson, Megan P, DO  albuterol (PROVENTIL) (2.5 MG/3ML) 0.083% nebulizer solution Take 3 mLs (2.5 mg total) by nebulization every 4 (  four) hours as needed for wheezing or shortness of breath. 09/09/17   Johnson, Megan P, DO  albuterol (VENTOLIN HFA) 108 (90 Base) MCG/ACT inhaler Ventolin HFA 90 mcg/actuation aerosol inhaler    [provider]  amitriptyline (ELAVIL) 25 MG tablet Take 1 tablet (25 mg total) by mouth at bedtime. 09/09/17   Johnson, Megan P, DO  Cetirizine HCl (ZYRTEC ALLERGY) 10 MG CAPS Take 10 mg by  mouth daily.    [provider]  escitalopram (LEXAPRO) 10 MG tablet Take 1 tablet (10 mg total) by mouth daily. 09/09/17   Johnson, Megan P, DO  esomeprazole (NEXIUM) 20 MG capsule Take 20 mg by mouth daily at 12 noon.    [provider]  feeding supplement, ENSURE ENLIVE, (ENSURE ENLIVE) LIQD Take 237 mLs by mouth 3 (three) times daily between meals. 12/18/16   Bettey Costa, MD  ferrous sulfate (FERROUSUL) 325 (65 FE) MG tablet Take 1 tablet (325 mg total) by mouth daily with breakfast. 09/10/17   Park Liter P, DO  fluconazole (DIFLUCAN) 100 MG tablet Take 1 tablet (100 mg total) by mouth daily. 05/07/17   Johnson, Megan P, DO  fluticasone (FLONASE) 50 MCG/ACT nasal spray Place 1 spray into both nostrils daily.  07/23/14   [provider]  furosemide (LASIX) 40 MG tablet Take 1 tablet (40 mg total) by mouth daily. 09/09/17   Johnson, Megan P, DO  lactulose (CHRONULAC) 10 GM/15ML solution Take 15 mLs (10 g total) by mouth daily. 09/09/17   Park Liter P, DO  loperamide (IMODIUM) 2 MG capsule Take 1 capsule (2 mg total) by mouth every 6 (six) hours as needed for diarrhea or loose stools. 08/20/16   Dustin Flock, MD  mirtazapine (REMERON) 30 MG tablet Take 1 tablet (30 mg total) by mouth at bedtime. 09/09/17   Johnson, Megan P, DO  mometasone (ELOCON) 0.1 % ointment Apply topically daily. 06/18/17   Park Liter P, DO  Multiple Vitamin (MULTIVITAMIN) tablet Take 1 tablet by mouth daily.    [provider]  nystatin (MYCOSTATIN) 100000 UNIT/ML suspension Take 5 mLs (500,000 Units total) by mouth 2 (two) times daily. Uses with his inhalers 05/07/17   Park Liter P, DO  ondansetron (ZOFRAN ODT) 4 MG disintegrating tablet Take 1 tablet (4 mg total) by mouth every 8 (eight) hours as needed for nausea or vomiting. 09/09/17   Johnson, Megan P, DO  potassium chloride SA (KLOR-CON M20) 20 MEQ tablet Take 1 tablet (20 mEq total) by mouth daily. 09/09/17   Park Liter P, DO   prednisoLONE acetate (PRED FORTE) 1 % ophthalmic suspension  05/10/16   [provider]  predniSONE (DELTASONE) 50 MG tablet Take 1 tablet (50 mg total) by mouth daily with breakfast. 09/09/17   Park Liter P, DO  pregabalin (LYRICA) 150 MG capsule Take 1 capsule (150 mg total) by mouth 3 (three) times daily. 09/09/17 09/09/18  Park Liter P, DO  Roflumilast (DALIRESP) 250 MCG TABS Take by mouth. 01/22/17   [provider]  spironolactone (ALDACTONE) 100 MG tablet Take 1 tablet (100 mg total) by mouth daily. 09/09/17   Park Liter P, DO  theophylline (THEODUR) 300 MG 12 hr tablet  05/19/17   [provider]  tiotropium (SPIRIVA HANDIHALER) 18 MCG inhalation capsule Place 1 capsule (18 mcg total) into inhaler and inhale daily. 09/09/17   Johnson, Megan P, DO  traMADol (ULTRAM) 50 MG tablet Take 1 tablet (50 mg total) by mouth 2 (two) times daily  as needed for up to 28 days. 09/09/17 10/07/17  Park Liter P, DO  traMADol (ULTRAM) 50 MG tablet Take 1 tablet (50 mg total) by mouth every 12 (twelve) hours as needed for up to 28 days. 10/07/17 11/04/17  Johnson, Megan P, DO  traMADol (ULTRAM) 50 MG tablet Take 1 tablet (50 mg total) by mouth every 12 (twelve) hours as needed for up to 28 days. 11/04/17 12/02/17  Park Liter P, DO    Allergies Patient has no known allergies.  Family History  Problem Relation Age of Onset  . Diabetes Mother   . CAD Mother   . Cancer Father        lung  . Heart disease Father   . Heart attack Father   . COPD Sister   . Heart disease Brother   . Heart attack Brother   . COPD Brother   . Hypertension Neg Hx   . Stroke Neg Hx     Social History Social History   Tobacco Use  . Smoking status: Current Every Day Smoker    Packs/day: 1.00    Years: 40.00    Pack years: 40.00    Types: Cigarettes  . Smokeless tobacco: Never Used  Substance Use Topics  . Alcohol use: Yes    Comment: daughter states patient has been drinking heavily for  the past three years with increased drinking over the past 6 months.  . Drug use: No    Types: Marijuana    Review of Systems Constitutional: No fever/chills Eyes: No visual changes. ENT: No sore throat. Cardiovascular: Denies chest pain. Respiratory: Denies shortness of breath. Gastrointestinal: Positive for abdominal distention. Genitourinary: Negative for dysuria. Musculoskeletal: Positive for bilateral leg swelling and redness. Skin: Negative for rash. Neurological: Negative for headaches, focal weakness or numbness.  ____________________________________________   PHYSICAL EXAM:  VITAL SIGNS: ED Triage Vitals  Enc Vitals Group     BP 09/28/17 1442 103/60     Pulse Rate 09/28/17 1442 (!) 123     Resp 09/28/17 1442 16     Temp 09/28/17 1442 98.2 F (36.8 C)     Temp Source 09/28/17 1442 Oral     SpO2 09/28/17 1442 94 %     Weight --      Height --      Head Circumference --      Peak Flow --      Pain Score 09/28/17 1443 0   Constitutional: Alert and oriented.  Eyes: Conjunctivae are normal.  ENT      Head: Normocephalic and atraumatic.      Nose: No congestion/rhinnorhea.      Mouth/Throat: Mucous membranes are moist.      Neck: No stridor. Hematological/Lymphatic/Immunilogical: No cervical lymphadenopathy. Cardiovascular: Tachycardic, regular rhythm.  No murmurs, rubs, or gallops.  Respiratory: Normal respiratory effort without tachypnea nor retractions. Breath sounds are clear and equal bilaterally. No wheezes/rales/rhonchi. Gastrointestinal: Distended abdomen. No rebound. No guarding.  Genitourinary: Deferred Musculoskeletal: Bilateral leg pitting edema Neurologic:  Normal speech and language. No gross focal neurologic deficits are appreciated.  Skin:  Erythema noted over bilateral legs. Psychiatric: Mood and affect are normal. Speech and behavior are normal. Patient exhibits appropriate insight and judgment.  ____________________________________________     LABS (pertinent positives/negatives)  CBC wbc 9.9, hgb 11.1, plt 123 CMP na 138, k 3.6, glu 93, cr 1.89, alk phos 214, tot bili 2.0 BNP 59.0  ____________________________________________   EKG  I, Nance Pear, attending physician, personally viewed and  interpreted this EKG  EKG Time: 1454 Rate: 123 Rhythm: sinus tachycardia Axis: normal Intervals: qtc 418 QRS: narrow ST changes: no st elevation Impression: abnormal ekg   ____________________________________________    RADIOLOGY  CXR Atelectatic changes  [place] bilateral lower extremities No DVT ____________________________________________   PROCEDURES  Procedures  ____________________________________________   INITIAL IMPRESSION / ASSESSMENT AND PLAN / ED COURSE  Pertinent labs & imaging results that were available during my care of the patient were reviewed by me and considered in my medical decision making (see chart for details).   Presented to the emergency department today because of concerns for lower extremity swelling, redness.  Exam is consistent with peripheral edema with cellulitis.  Ultrasound was performed to evaluate for blood clot which was negative.  Patient was given IV Lasix as well as dose of IV antibiotics here.  Will discharge with further antibiotics.  Discussed findings plan with patient.   ____________________________________________   FINAL CLINICAL IMPRESSION(S) / ED DIAGNOSES  Final diagnoses:  Peripheral edema  Cellulitis, unspecified cellulitis site     Note: This dictation was prepared with Dragon dictation. Any transcriptional errors that result from this process are unintentional     Nance Pear, MD 09/28/17 2225

## 2017-09-28 NOTE — ED Notes (Signed)
Pt refused offer of community resources for ETOH abuse

## 2017-09-30 IMAGING — DX DG CHEST 1V PORT
2 series · 2 of 2 positions shown · non-contrast
Comparison: None.

CLINICAL DATA: Jaundiced, distended abdomen. Symptoms for 4 weeks.

EXAM:
PORTABLE CHEST 1 VIEW

[chest ap (1 of 2)]
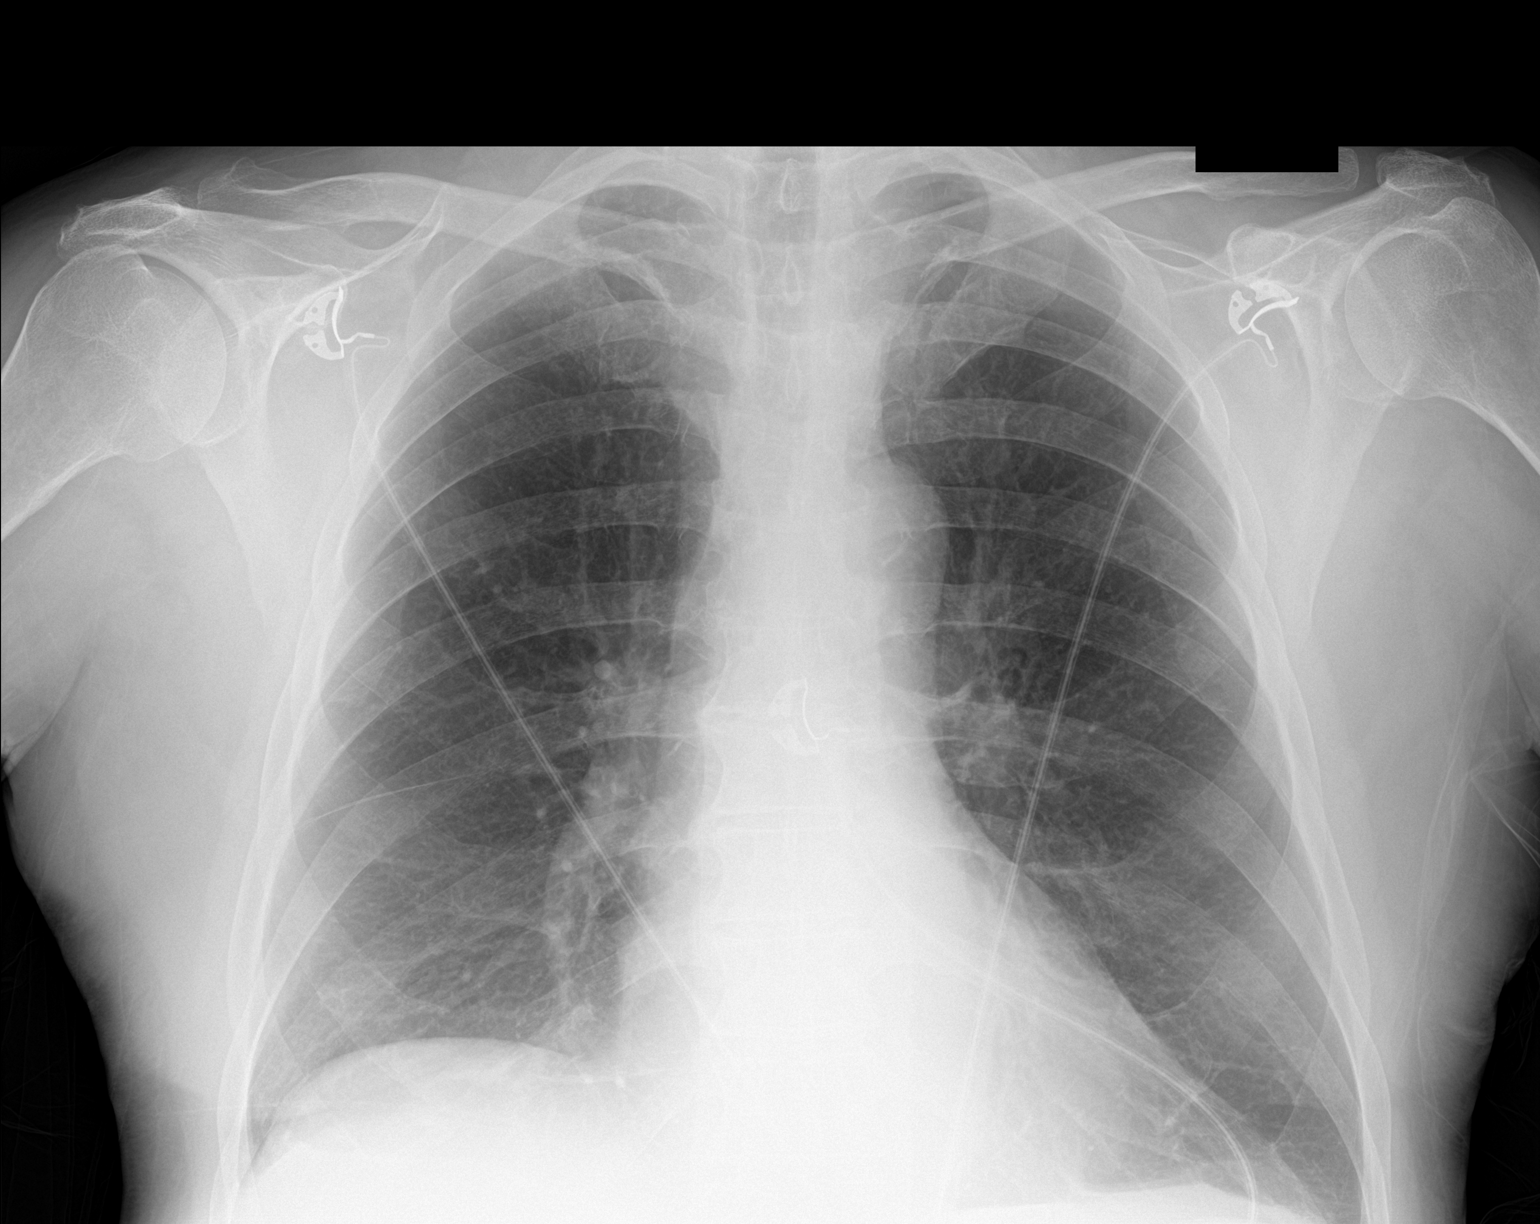

[chest ap (2 of 2)]
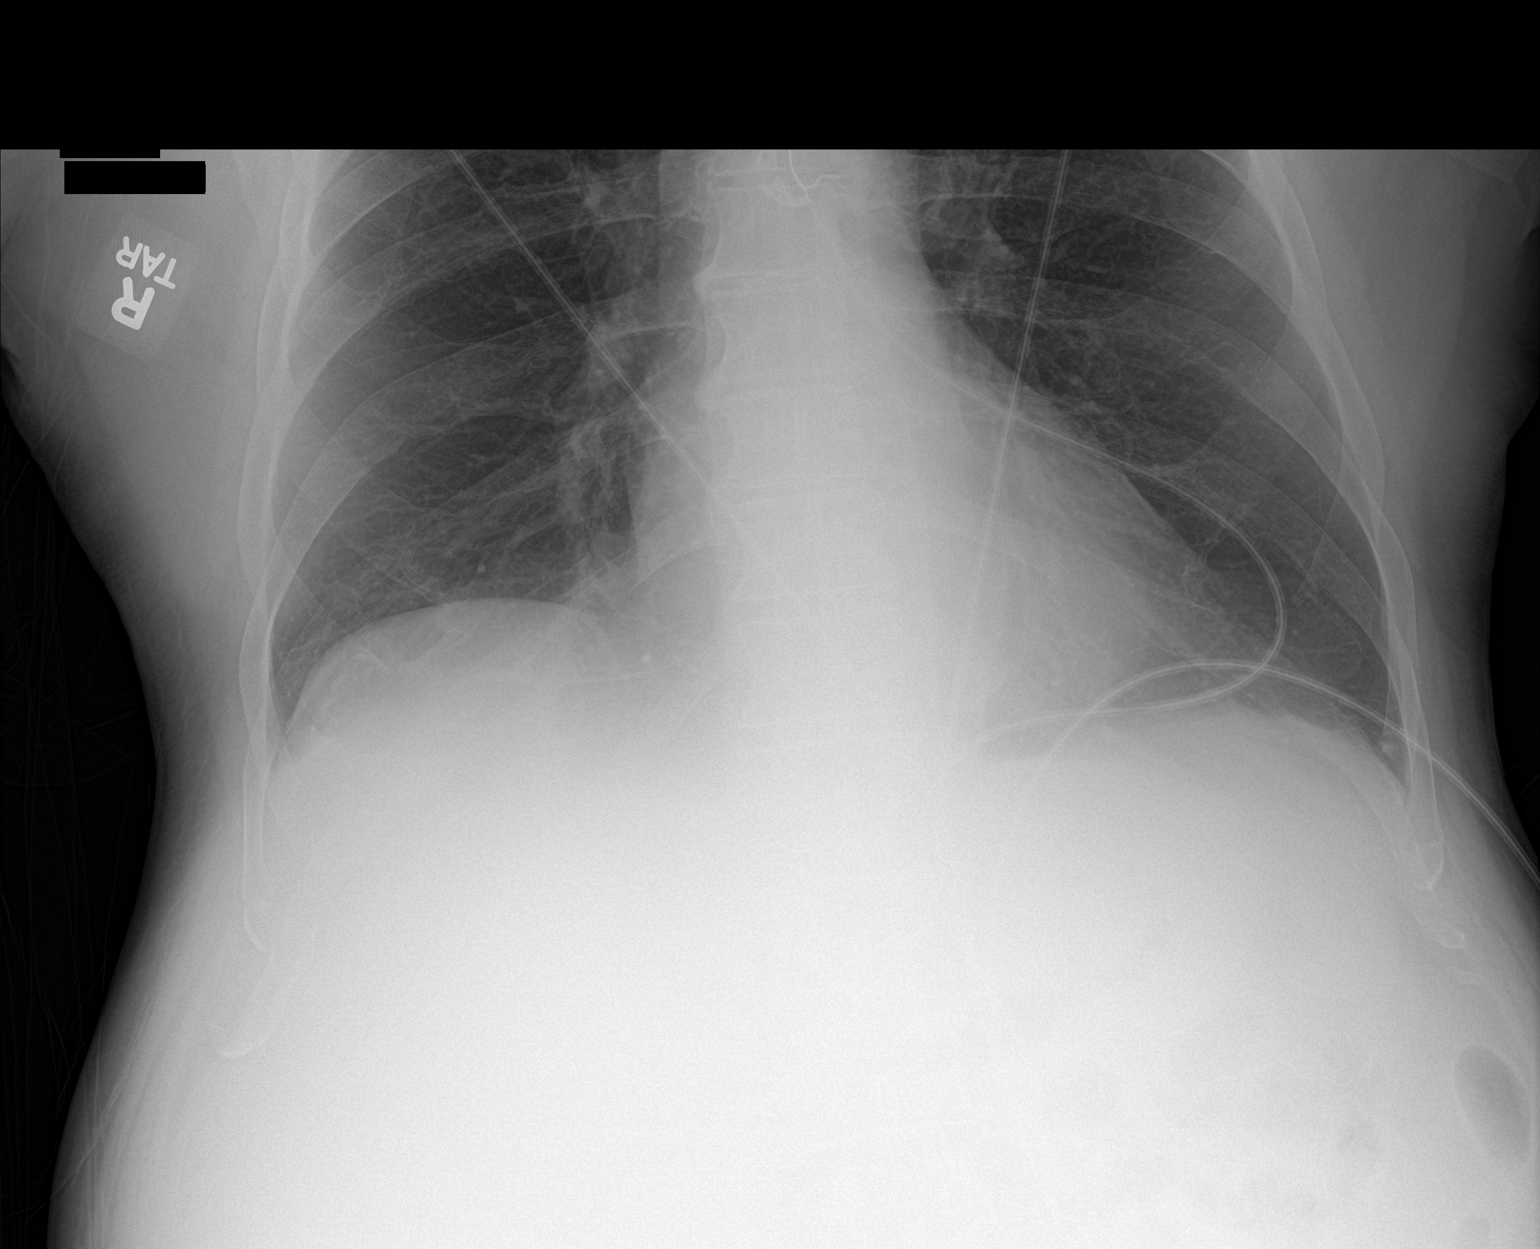

[2 of 2 positions shown; findings below may reference images not displayed]

FINDINGS: The heart size and mediastinal contours are within normal limits.
Both lungs are clear. The visualized skeletal structures are
unremarkable.
IMPRESSION: No active disease.

## 2017-10-01 ENCOUNTER — Telehealth: Payer: Self-pay

## 2017-10-01 IMAGING — MR MR ABDOMEN WO/W CM MRCP
7 of 19 series · 15 of 48 positions shown · IV contrast (multihance)
Comparison: No priors.

CLINICAL DATA: 60-year-old male with history of generalize mall
lays for the past 4 weeks, during which time he has lost 8 pounds.
Leg swelling. White bowel movements. Lethargy. Jaundice. Alcohol
abuse. Elevated total bilirubin.

EXAM:
MRI ABDOMEN WITHOUT AND WITH CONTRAST (INCLUDING MRCP)
TECHNIQUE: Multiplanar multisequence MR imaging of the abdomen was performed
both before and after the administration of intravenous contrast.
Heavily T2-weighted images of the biliary and pancreatic ducts were
obtained, and three-dimensional MRCP images were rendered by post
processing.
CONTRAST:  17mL MULTIHANCE GADOBENATE DIMEGLUMINE 529 MG/ML IV SOLN

[Series 5: cor true fisp · coronal · 5.0mm · 0.82mm/px · 1 of 50 slices shown]
[im 1/50]
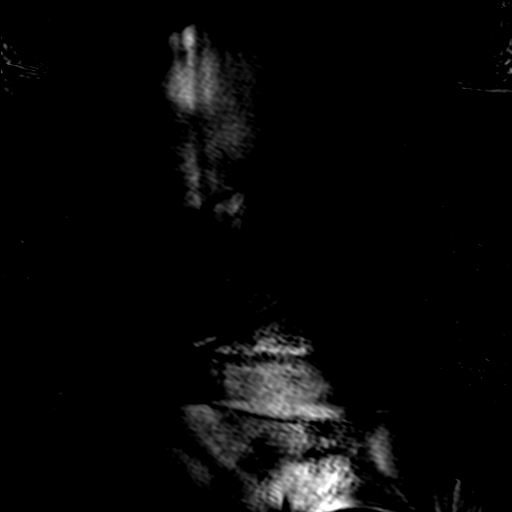

[Series 6: T2 fat-sat · axial · 7.0mm · 0.78mm/px · 1 of 40 slices shown]
[im 1/40]
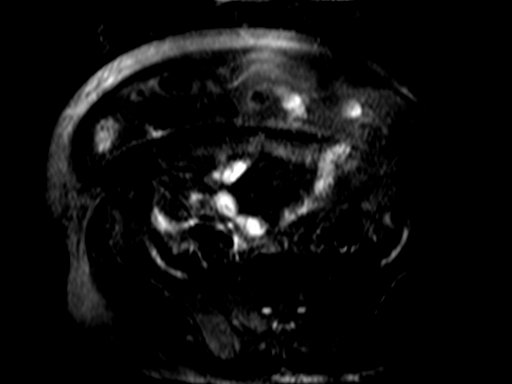

[Series 7: T2 · axial · 7.0mm · 1.48mm/px · 1 of 40 slices shown]
[im 1/40]
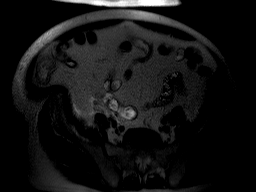

[Series 8: axial in-out of · axial · 7.0mm · 0.74mm/px · z∈[-114,+213]mm · 3 of 80 slices shown]
[im 1/80]
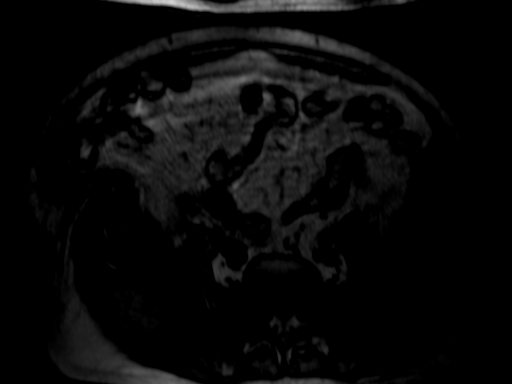
[im 40/80]
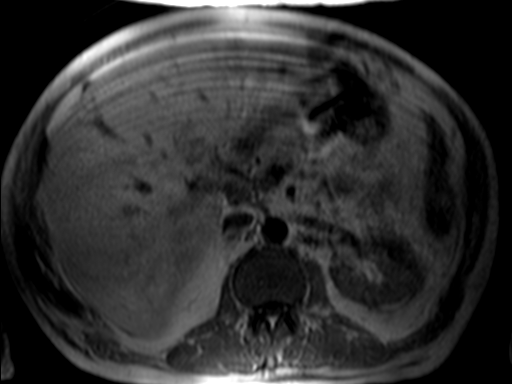
[im 80/80]
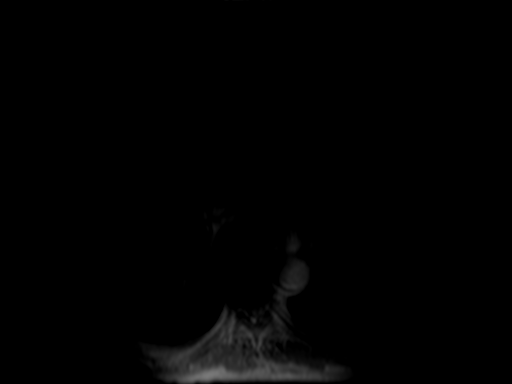

[Series 12: cor thins · coronal · 3.0mm · 0.74mm/px · 1 of 30 slices shown]
[im 1/30]
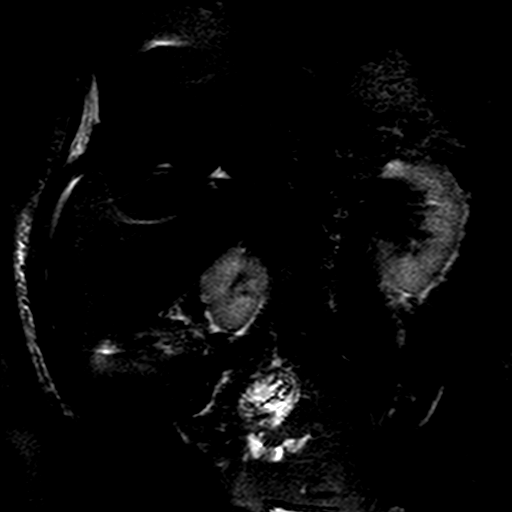

[Series 16: DWI · axial · 6.0mm · 2.97mm/px · z∈[-145,+244]mm · 6 of 163 slices shown]
[im 1/163]
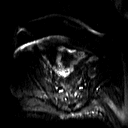
[im 33/163]
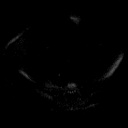
[im 65/163]
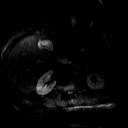
[im 98/163]
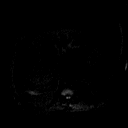
[im 130/163]
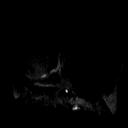
[im 163/163]
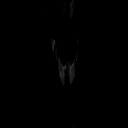

[Series 17: axial dwi_adc · axial · 6.0mm · 2.97mm/px · z∈[-145,+244]mm · 2 of 52 slices shown]
[im 1/52]
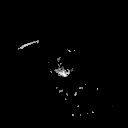
[im 52/52]
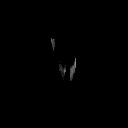

[15 of 48 positions shown; findings below may reference images not displayed]

FINDINGS: Comment: Study is limited by considerable patient respiratory
motion.

Lower chest: Signal intensity in the right lower lobe favored to
reflect subsegmental atelectasis. Trace right pleural effusion lying
dependently.

Hepatobiliary: Diffuse loss of signal intensity throughout the
hepatic parenchyma, compatible with hepatic steatosis. Liver is
enlarged measuring 27.7 cm craniocaudally. No suspicious cystic or
solid hepatic lesions. No intra or extrahepatic biliary ductal
dilatation noted on MRCP images. Common bile duct measures 5 mm in
the porta hepatis. No filling defects in the common bile duct to
suggest choledocholithiasis. Gallbladder is normal in appearance.

Pancreas: No pancreatic mass. No pancreatic ductal dilatation. No
pancreatic or peripancreatic fluid or inflammatory changes.

Spleen: Unremarkable.  Specifically, normal in size.

Adrenals/Urinary Tract: Bilateral kidneys and bilateral adrenal
glands are normal in appearance. No hydroureteronephrosis in the
visualized abdomen.

Stomach/Bowel: Visualized portions are unremarkable.

Vascular/Lymphatic: No aneurysm noted in the visualized portions of
the abdomen. Portal vein is patent and borderline dilated measuring
14 mm in diameter.

Other: Trace volume of ascites.

Musculoskeletal: No aggressive osseous lesions are noted in the
visualized portions of the skeleton.
IMPRESSION: 1. Hepatomegaly with hepatic steatosis.
2. No suspicious hepatic lesions.
3. No evidence of biliary tract obstruction.
4. Trace volume of ascites.
5. Trace right pleural effusion with probable subsegmental
atelectasis in the right lower lobe.

## 2017-10-01 NOTE — Telephone Encounter (Signed)
Spoke with home health, patient would like to discuss an parancentesis, let home health know that he should contact Dr.Anna to see about getting an order for this.   Patient is also finishing up an antibiotic, so they would like to let you know that they may possible need to do a 3 layer compression on his legs from the cellulitis.  HH will send an order if needed.   Also patient's pulse is 110   Copied from CRM [id number]. Topic: General - Other >> Oct 01, 2017 11:50 AM Devin Bryant, Devin Bryant wrote: Reason for [id number]:Devin Bryant to give her a call in regards to the patient.    Best contact number [phone]

## 2017-10-01 NOTE — Telephone Encounter (Signed)
Noted. Thanks.

## 2017-10-03 ENCOUNTER — Telehealth: Payer: Self-pay | Admitting: Gastroenterology

## 2017-10-03 ENCOUNTER — Other Ambulatory Visit: Payer: Self-pay

## 2017-10-03 DIAGNOSIS — K7031 Alcoholic cirrhosis of liver with ascites: Secondary | ICD-10-CM

## 2017-10-03 MED ORDER — ALBUMIN HUMAN 25 % IV SOLN: 25.0000 g | Freq: Once | INTRAVENOUS | Status: DC

## 2017-10-03 NOTE — Telephone Encounter (Signed)
Pt wife left vm in regards to scheduling a Paracentisis also she would like to speak to someone she states healthcare assistant comes to see pt and they recommended a drain to be inserted for pt please call

## 2017-10-03 NOTE — Telephone Encounter (Signed)
Pt has been scheduled for an US Paracentesis on 10/11/17.

## 2017-10-04 ENCOUNTER — Telehealth: Payer: Self-pay | Admitting: Family Medicine

## 2017-10-04 NOTE — Telephone Encounter (Signed)
OK to give verbal.

## 2017-10-04 NOTE — Telephone Encounter (Signed)
Copied from CRM [id number]. Topic: Quick Communication - See Telephone Encounter >> Oct 04, 2017  9:34 AM Burchel, Abbi R wrote: CRM for notification. See Telephone encounter for: 10/04/17.   [facility]:  requesting v/o for an OT eval  Cb: [phone]

## 2017-10-04 NOTE — Telephone Encounter (Signed)
Marisue IvanLiz- [facility] notified

## 2017-10-07 ENCOUNTER — Other Ambulatory Visit: Payer: Self-pay

## 2017-10-07 ENCOUNTER — Encounter: Payer: Self-pay | Admitting: Gastroenterology

## 2017-10-07 ENCOUNTER — Ambulatory Visit: Payer: BLUE CROSS/BLUE SHIELD | Admitting: Gastroenterology

## 2017-10-07 ENCOUNTER — Telehealth: Payer: Self-pay | Admitting: Family Medicine

## 2017-10-07 VITALS — BP 115/70 | HR 128 | Ht 70.0 in | Wt 184.0 lb

## 2017-10-07 DIAGNOSIS — K7031 Alcoholic cirrhosis of liver with ascites: Secondary | ICD-10-CM

## 2017-10-07 MED ORDER — RIFAXIMIN 550 MG PO TABS: 550.0000 mg | ORAL_TABLET | Freq: Two times a day (BID) | ORAL | 2 refills | Status: DC

## 2017-10-07 NOTE — Telephone Encounter (Signed)
Copied from CRM [id number]. Topic: Quick Communication - See Telephone Encounter >> Oct 07, 2017  8:50 AM Jolayne Hainesaylor, Brittany L wrote: CRM for notification. See Telephone encounter for: 10/07/17.  Judeth CornfieldStephanie called with [facility] and stated that his wife called about the prescriptions for hospital bed, compression socks, wheelchair & bedside commode. She is requesting copies of this so she can reach out to [facility] to see if this has already been done or can get the ball rolling for her. Call back is [phone] and fax number is [phone]

## 2017-10-07 NOTE — Telephone Encounter (Signed)
New order written as follows: Will get a signature and fax.   Patient Name: Devin HusbandsDavid Bryant Patient DOB: 02-27-1956 [address] [place] [id number]  Please contact patient's wife [name] with any questions or concerns at [phone] Medical supplies and equipment order:  Hospital Bed Wheelchair  Bedside commode Compression stockings  Diagnosis: K 70.30, G62.1, K70.11, D75.89, G62.9, I47.2      Ordering Provider: Roosvelt Maserachel Lane PA-C   _________________________________________________

## 2017-10-07 NOTE — Telephone Encounter (Signed)
Looks like from her note that she had prepared those orders - can you look into that?

## 2017-10-07 NOTE — Progress Notes (Addendum)
Wyline Mood MD, MRCP(U.K) [address]  [address]  [place], [place] [address]  Main: [phone]  Fax: [phone]   Primary Care Physician: Dorcas Carrow, DO  Primary Gastroenterologist:  Dr. Wyline Mood   No chief complaint on file.   HPI: Devin Bryant is a 62 y.o. male   Summary of history : The patient is here to  follow up for decompensated alcoholic liver cirrhosis.  He was actively consuming alcohol  prior to hospital admission. On admission his liver function tests were elevated with a total bilirubin of 11 ultrasound Doppler was negative for portal vein thrombosis, he had an abdominal paracentesis and the fluid tested with negative for spontaneous bacterial peritonitis. He had been on Aldactone as an outpatient. He has been treated for alcoholic hepatitis in 10/23/2015 with steroids. He was treated for his cirrhosis low-sodium diet commenced on Lasix 20 mg and Aldactone 50 mg with plan for endoscopy as an outpatient to screen for varices. Previously discussed extensively that unless he stops drinking alcohol that he would die. He said that he was well aware and that he would not wish to stop drinking and was well aware about the risks of death and would not change anything. He had mentioned when it would be appropriate he would consider hospice care.   Labs 12/18/2016 AFP 7.3 ,31 smooth muscle antibody 17 ANA negative ,B12 normal folate normal ferritin elevated. SAAGwas suggestive of portal hypertension. INR 1.09. The bilirubin was 5.7 with a direct component. TSH 1.49 on 01/18/2017 CMP on 01/18/2017 with a creatinine of 0.66 albumin of 2.6 alkaline phosphatase of 247 total bilirubin down at 2.4.   Underwent paracentesis and had 1.6 litres of fluid taken out On 02/15/17 .   Interval history  08/14/2017 -10/07/2017  Looks more ill than his last visit, still drinking alcohol. On oxygen , finds it hard to breathe, significant abdominal distension, has  generalized pain.   Wanted to discuss about long term abdominal catheter here with his wife. Says they would like to start hospice care process. Some confusion on and off.    Current Outpatient Medications  Medication Sig Dispense Refill  . ADVAIR HFA 115-21 MCG/ACT inhaler Inhale 2 puffs into the lungs 2 (two) times daily. 3 Inhaler 3  . albuterol (PROVENTIL HFA;VENTOLIN HFA) 108 (90 Base) MCG/ACT inhaler Inhale 2 puffs into the lungs every 6 (six) hours as needed for wheezing or shortness of breath. 1 Inhaler 12  . albuterol (PROVENTIL) (2.5 MG/3ML) 0.083% nebulizer solution Take 3 mLs (2.5 mg total) by nebulization every 4 (four) hours as needed for wheezing or shortness of breath. 50 mL 3  . albuterol (VENTOLIN HFA) 108 (90 Base) MCG/ACT inhaler Ventolin HFA 90 mcg/actuation aerosol inhaler    . amitriptyline (ELAVIL) 25 MG tablet Take 1 tablet (25 mg total) by mouth at bedtime. 90 tablet 1  . cephALEXin (KEFLEX) 500 MG capsule Take 1 capsule (500 mg total) by mouth 4 (four) times daily for 10 days. 40 capsule 0  . Cetirizine HCl (ZYRTEC ALLERGY) 10 MG CAPS Take 10 mg by mouth daily.    Marland Kitchen escitalopram (LEXAPRO) 10 MG tablet Take 1 tablet (10 mg total) by mouth daily. 90 tablet 1  . esomeprazole (NEXIUM) 20 MG capsule Take 20 mg by mouth daily at 12 noon.    . feeding supplement, ENSURE ENLIVE, (ENSURE ENLIVE) LIQD Take 237 mLs by mouth 3 (three) times daily between meals. 237 mL 12  . ferrous sulfate (FERROUSUL) 325 (  65 FE) MG tablet Take 1 tablet (325 mg total) by mouth daily with breakfast. 90 tablet 3  . fluconazole (DIFLUCAN) 100 MG tablet Take 1 tablet (100 mg total) by mouth daily. 7 tablet 1  . fluticasone (FLONASE) 50 MCG/ACT nasal spray Place 1 spray into both nostrils daily.     . furosemide (LASIX) 40 MG tablet Take 1 tablet (40 mg total) by mouth daily. 90 tablet 1  . lactulose (CHRONULAC) 10 GM/15ML solution Take 15 mLs (10 g total) by mouth daily. 240 mL 12  . loperamide  (IMODIUM) 2 MG capsule Take 1 capsule (2 mg total) by mouth every 6 (six) hours as needed for diarrhea or loose stools. 30 capsule 0  . mirtazapine (REMERON) 30 MG tablet Take 1 tablet (30 mg total) by mouth at bedtime. 90 tablet 1  . mometasone (ELOCON) 0.1 % ointment Apply topically daily. 45 g 3  . Multiple Vitamin (MULTIVITAMIN) tablet Take 1 tablet by mouth daily.    Marland Kitchen. nystatin (MYCOSTATIN) 100000 UNIT/ML suspension Take 5 mLs (500,000 Units total) by mouth 2 (two) times daily. Uses with his inhalers 60 mL 13  . ondansetron (ZOFRAN ODT) 4 MG disintegrating tablet Take 1 tablet (4 mg total) by mouth every 8 (eight) hours as needed for nausea or vomiting. 180 tablet 1  . potassium chloride SA (KLOR-CON M20) 20 MEQ tablet Take 1 tablet (20 mEq total) by mouth daily. 90 tablet 1  . prednisoLONE acetate (PRED FORTE) 1 % ophthalmic suspension     . predniSONE (DELTASONE) 50 MG tablet Take 1 tablet (50 mg total) by mouth daily with breakfast. 5 tablet 0  . pregabalin (LYRICA) 150 MG capsule Take 1 capsule (150 mg total) by mouth 3 (three) times daily. 270 capsule 1  . Roflumilast (DALIRESP) 250 MCG TABS Take by mouth.    . spironolactone (ALDACTONE) 100 MG tablet Take 1 tablet (100 mg total) by mouth daily. 180 tablet 1  . theophylline (THEODUR) 300 MG 12 hr tablet     . tiotropium (SPIRIVA HANDIHALER) 18 MCG inhalation capsule Place 1 capsule (18 mcg total) into inhaler and inhale daily. 90 capsule 1  . traMADol (ULTRAM) 50 MG tablet Take 1 tablet (50 mg total) by mouth 2 (two) times daily as needed for up to 28 days. 56 tablet 0  . traMADol (ULTRAM) 50 MG tablet Take 1 tablet (50 mg total) by mouth every 12 (twelve) hours as needed for up to 28 days. 56 tablet 0  . [START ON 11/04/2017] traMADol (ULTRAM) 50 MG tablet Take 1 tablet (50 mg total) by mouth every 12 (twelve) hours as needed for up to 28 days. 56 tablet 0   Current Facility-Administered Medications  Medication Dose Route Frequency  Provider Last Rate Last Dose  . albumin human 25 % solution 25 g  25 g Intravenous Once Wyline MoodAnna, Rashed Edler, MD      . albumin human 25 % solution 50 g  50 g Intravenous Once Wyline MoodAnna, Dai Apel, MD      . albumin human 25 % solution 50 g  50 g Intravenous Once Wyline MoodAnna, Cheo Selvey, MD      . albumin human 25 % solution 50 g  50 g Intravenous Once Wyline MoodAnna, Shylee Durrett, MD      . cyanocobalamin ((VITAMIN B-12)) injection 1,000 mcg  1,000 mcg Intramuscular Q30 days Olevia PerchesJohnson, Megan P, DO   1,000 mcg at 07/02/17 1633    Allergies as of 10/07/2017  . (No Known Allergies)  ROS:  General: Negative for anorexia, weight loss, fever, chills, fatigue, weakness. ENT: Negative for hoarseness, difficulty swallowing , nasal congestion. CV: Negative for chest pain, angina, palpitations, dyspnea on exertion, peripheral edema.  Respiratory: Negative for dyspnea at rest, dyspnea on exertion, cough, sputum, wheezing.  GI: See history of present illness. GU:  Negative for dysuria, hematuria, urinary incontinence, urinary frequency, nocturnal urination.  Endo: Negative for unusual weight change.    Physical Examination:   There were no vitals taken for this visit.  General: Thin and cachectic Eyes: No icterus. Conjunctivae pink. Mouth: Oropharyngeal mucosa moist and pink , no lesions erythema or exudate. Lungs: Clear to auscultation bilaterally. Non-labored. Heart: Regular rate and rhythm, no murmurs rubs or gallops.  Abdomen: distened, fluid thrill ++, very tense, no tenderness .   Extremities: B/l pitting edema 3+. No clubbing or deformities. Neuro: Alert and oriented x 3.  Grossly intact. Skin: Warm and dry, no jaundice.   Psych: Alert and cooperative, normal mood and affect.   Imaging Studies: Dg Chest 2 View  Result Date: 09/28/2017 CLINICAL DATA:  Leg swelling EXAM: CHEST - 2 VIEW COMPARISON:  12/17/2016 FINDINGS: Cardiac shadow is stable. The lungs are well aerated bilaterally. Bibasilar atelectatic changes are noted is  without focal infiltrate or effusion. No bony abnormality is noted. IMPRESSION: Bibasilar atelectatic changes Electronically Signed   By: Alcide Clever M.D.   On: 09/28/2017 15:41   US Venous Img Lower Bilateral  Result Date: 09/28/2017 CLINICAL DATA:  Swelling for 2 weeks EXAM: BILATERAL LOWER EXTREMITY VENOUS DOPPLER ULTRASOUND TECHNIQUE: Gray-scale sonography with graded compression, as well as color Doppler and duplex ultrasound were performed to evaluate the lower extremity deep venous systems from the level of the common femoral vein and including the common femoral, femoral, profunda femoral, popliteal and calf veins including the posterior tibial, peroneal and gastrocnemius veins when visible. The superficial great saphenous vein was also interrogated. Spectral Doppler was utilized to evaluate flow at rest and with distal augmentation maneuvers in the common femoral, femoral and popliteal veins. COMPARISON:  None. FINDINGS: RIGHT LOWER EXTREMITY Common Femoral Vein: No evidence of thrombus. Normal compressibility, respiratory phasicity and response to augmentation. Saphenofemoral Junction: No evidence of thrombus. Normal compressibility and flow on color Doppler imaging. Profunda Femoral Vein: No evidence of thrombus. Normal compressibility and flow on color Doppler imaging. Femoral Vein: No evidence of thrombus. Normal compressibility, respiratory phasicity and response to augmentation. Popliteal Vein: No evidence of thrombus. Normal compressibility, respiratory phasicity and response to augmentation. Calf Veins: No evidence of thrombus. Normal compressibility and flow on color Doppler imaging. Superficial Great Saphenous Vein: No evidence of thrombus. Normal compressibility. Venous Reflux:  None. Other Findings:  None. LEFT LOWER EXTREMITY Common Femoral Vein: No evidence of thrombus. Normal compressibility, respiratory phasicity and response to augmentation. Saphenofemoral Junction: No evidence of  thrombus. Normal compressibility and flow on color Doppler imaging. Profunda Femoral Vein: No evidence of thrombus. Normal compressibility and flow on color Doppler imaging. Femoral Vein: No evidence of thrombus. Normal compressibility, respiratory phasicity and response to augmentation. Popliteal Vein: No evidence of thrombus. Normal compressibility, respiratory phasicity and response to augmentation. Calf Veins: No evidence of thrombus. Normal compressibility and flow on color Doppler imaging. Superficial Great Saphenous Vein: No evidence of thrombus. Normal compressibility. Venous Reflux:  None. Other Findings:  None. IMPRESSION: No evidence of deep venous thrombosis. Electronically Signed   By: Charlett Nose M.D.   On: 09/28/2017 18:43   US Paracentesis  Result Date: 09/13/2017  INDICATION: Ascites EXAM: ULTRASOUND GUIDED  PARACENTESIS MEDICATIONS: None. COMPLICATIONS: None immediate. PROCEDURE: Informed written consent was obtained from the patient after a discussion of the risks, benefits and alternatives to treatment. A timeout was performed prior to the initiation of the procedure. Initial ultrasound scanning demonstrates a large amount of ascites within the right lower abdominal quadrant. The right lower abdomen was prepped and draped in the usual sterile fashion. 1% lidocaine with epinephrine was used for local anesthesia. Following this, a 6 Fr Safe-T-Centesis catheter was introduced. An ultrasound image was saved for documentation purposes. The paracentesis was performed. The catheter was removed and a dressing was applied. The patient tolerated the procedure well without immediate post procedural complication. FINDINGS: A total of approximately 5.7 L of clear yellow fluid was removed. IMPRESSION: Successful ultrasound-guided paracentesis yielding 5.7 liters of peritoneal fluid. Electronically Signed   By: Jolaine Click M.D.   On: 09/13/2017 15:03    Assessment and Plan:   Yarden Hillis is a 62  y.o. y/o male  here to follow up for decompensated alcoholic liver cirrhosis (decompensated) , with ascites, no encephalopathy and active alcohol consumption. Aware he is getting more ill and appears more malnourished. He is here to discuss an abdominal Pleurex catheter. I explained that usually we do not place a long term catheter since the risks of spontanous bacterial peritonitis can range from 10% to 40%. The  Mortality with SBP is about 50%. With these risks he stated he didn't want the catheter.    Plan  1. Urgent paracentesis either today or tomorrow.  2. Xifaxan for encephelopathy  3. Explained altough a pleurex catheter is not probably the safest option at this time, if he gets sicker and finds it harder to visit the hospital for paracentesis and in his own mind is willing the risk of infection and death , in that case we should order a pleurex to facilitate drainage of fluid at home and help his pain and breathing. Definettly he should be involved with hospice care at the point of obtaining the procedure so that they can take care of his symptoms in addition to abdominal distension to maintain his comfort level. His wife is aware that he is deteriorating .  4. I would be happy to order the procedure once he is established with hospice and I have discussed with them his clinical state at that time. 5. Explained that he is welcome to call my office anytime for any help. WE again discussed alcohol cessation- he is not keen and is aware that continued consumption will lead to death. I was very clear about it.     Dr Wyline Mood  MD,MRCP [facility]) Follow up PRN

## 2017-10-08 ENCOUNTER — Telehealth: Payer: Self-pay | Admitting: Family Medicine

## 2017-10-08 DIAGNOSIS — K7031 Alcoholic cirrhosis of liver with ascites: Secondary | ICD-10-CM

## 2017-10-08 DIAGNOSIS — J9621 Acute and chronic respiratory failure with hypoxia: Secondary | ICD-10-CM

## 2017-10-08 DIAGNOSIS — F10239 Alcohol dependence with withdrawal, unspecified: Secondary | ICD-10-CM

## 2017-10-08 DIAGNOSIS — J9622 Acute and chronic respiratory failure with hypercapnia: Secondary | ICD-10-CM

## 2017-10-08 DIAGNOSIS — K7011 Alcoholic hepatitis with ascites: Secondary | ICD-10-CM

## 2017-10-08 NOTE — Telephone Encounter (Signed)
Patient wife called stating that she needs to talk to Dr. Laural BenesJohnson about what [facility] told her husband about getting hospice involved in his care. Please call patient back.

## 2017-10-08 NOTE — Telephone Encounter (Signed)
[facility] called to check into the status of the paperwork being faxed back; contact if needed or fax to the number in the message below:       Copied from CRM [id number]. Topic: Quick Communication - See Telephone Encounter >> Oct 07, 2017  8:50 AM Jolayne Hainesaylor, Brittany L wrote: CRM for notification. See Telephone encounter for: 10/07/17.  Judeth CornfieldStephanie called with [facility] home [facility] and stated that his wife called about the prescriptions for hospital bed, compression socks, wheelchair & bedside commode. She is requesting copies of this so she can reach out to [facility] home [facility] to see if this has already been done or can get the ball rolling for her. Call back is [phone] and fax number is [phone] >> Oct 07, 2017  1:19 PM Gerrianne ScalePayne, Angela L wrote: Judeth CornfieldStephanie called with [facility] wanting Fleet ContrasRachel to know that she is faxing over a request for pt to have a hospital bed and wheel chair and just need pt height and provider signature please give her a call back at [phone]

## 2017-10-08 NOTE — Telephone Encounter (Signed)
Paperwork was faxed yesterday - see previous encounter

## 2017-10-08 NOTE — Discharge Instructions (Signed)
Paracentesis, Care After °Refer to this sheet in the next few weeks. These instructions provide you with information about caring for yourself after your procedure. Your health care provider may also give you more specific instructions. Your treatment has been planned according to current medical practices, but problems sometimes occur. Call your health care provider if you have any problems or questions after your procedure. °What can I expect after the procedure? °After your procedure, it is common to have a small amount of clear fluid coming from the puncture site. °Follow these instructions at home: °· Return to your normal activities as told by your health care provider. Ask your health care provider what activities are safe for you. °· Take over-the-counter and prescription medicines only as told by your health care provider. °· Do not take baths, swim, or use a hot tub until your health care provider approves. °· Follow instructions from your health care provider about: °? How to take care of your puncture site. °? When and how you should change your bandage (dressing). °? When you should remove your dressing. °· Check your puncture area every day signs of infection. Watch for: °? Redness, swelling, or pain. °? Fluid, blood, or pus. °· Keep all follow-up visits as told by your health care provider. This is important. °Contact a health care provider if: °· You have redness, swelling, or pain at your puncture site. °· You start to have more clear fluid coming from your puncture site. °· You have blood or pus coming from your puncture site. °· You have chills. °· You have a fever. °Get help right away if: °· You develop chest pain or shortness of breath. °· You develop increasing pain, discomfort, or swelling in your abdomen. °· You feel dizzy or light-headed or you pass out. °This information is not intended to replace advice given to you by your health care provider. Make sure you discuss any questions you  have with your health care provider. °Document Released: 07/06/2014 Document Revised: 07/28/2015 Document Reviewed: 05/04/2014 °Elsevier Interactive Patient Education © 2018 [employer] ° °

## 2017-10-08 NOTE — Telephone Encounter (Signed)
Per GI OV notes,   "3. Explained altough a pleurex catheter is not probably the safest option at this time, if he gets sicker and finds it harder to visit the hospital for paracentesis and in his own mind is willing the risk of infection and death , in that case we should order a pleurex to facilitate drainage of fluid at home and help his pain and breathing. Definettly he should be involved with hospice care at the point of obtaining the procedure so that they can take care of his symptoms in addition to abdominal distension to maintain his comfort level. His wife is aware that he is deteriorating .  4. I would be happy to order the procedure once he is established with hospice and I have discussed with them his clinical state at that time. 5. Explained that he is welcome to call my office anytime for any help. WE again discussed alcohol cessation- he is not keen and is aware that continued consumption will lead to death. I was very clear about it.  "

## 2017-10-09 ENCOUNTER — Ambulatory Visit
Admission: RE | Admit: 2017-10-09 | Discharge: 2017-10-09 | Disposition: A | Discharge status: Home / Self Care | Payer: BLUE CROSS/BLUE SHIELD | Source: Ambulatory Visit | Attending: Gastroenterology | Admitting: Gastroenterology

## 2017-10-09 ENCOUNTER — Telehealth: Payer: Self-pay | Admitting: Gastroenterology

## 2017-10-09 DIAGNOSIS — K7031 Alcoholic cirrhosis of liver with ascites: Secondary | ICD-10-CM | POA: Insufficient documentation

## 2017-10-09 MED ORDER — ALBUMIN HUMAN 25 % IV SOLN
INTRAVENOUS | Status: AC
  Administered 2017-10-09: 25 g via INTRAVENOUS
  Filled 2017-10-09: qty 100

## 2017-10-09 MED ORDER — ALBUMIN HUMAN 25 % IV SOLN
25.0000 g | Freq: Once | INTRAVENOUS | Status: AC
  Administered 2017-10-09: 25 g via INTRAVENOUS

## 2017-10-09 NOTE — Telephone Encounter (Signed)
[facility] states that patients family now agree that pt needs hospice. Home Health states since Dr.Anna has been pushing for patient to go to hospice they are Requesting a referral or order to the hospice branch connected with them. Hospice [phone].

## 2017-10-09 NOTE — Telephone Encounter (Signed)
Will leave for provider review

## 2017-10-10 ENCOUNTER — Encounter: Payer: Self-pay | Admitting: Family Medicine

## 2017-10-10 NOTE — Telephone Encounter (Signed)
Called and spoke to wife. Discussed that hospice has a significant cost. Wife is very concerned about him and notes that he has gone down hill quickly. She would like to discuss cost with hospice again. She notes that there is a singnificant cost for the home health now. We will put in a referral to hospice for more help. She is aware of the cost. Referral generated today.

## 2017-10-11 ENCOUNTER — Ambulatory Visit: Admission: RE | Admit: 2017-10-11 | Payer: BLUE CROSS/BLUE SHIELD | Source: Ambulatory Visit

## 2017-10-12 ENCOUNTER — Emergency Department: Payer: BLUE CROSS/BLUE SHIELD

## 2017-10-12 ENCOUNTER — Inpatient Hospital Stay
Admission: EM | Admit: 2017-10-12 | Discharge: 2017-11-03 | DRG: 441 | Disposition: E | Payer: BLUE CROSS/BLUE SHIELD | Attending: Internal Medicine | Admitting: Internal Medicine

## 2017-10-12 ENCOUNTER — Other Ambulatory Visit: Payer: Self-pay

## 2017-10-12 DIAGNOSIS — K7031 Alcoholic cirrhosis of liver with ascites: Secondary | ICD-10-CM | POA: Diagnosis not present

## 2017-10-12 DIAGNOSIS — F17210 Nicotine dependence, cigarettes, uncomplicated: Secondary | ICD-10-CM | POA: Diagnosis present

## 2017-10-12 DIAGNOSIS — N189 Chronic kidney disease, unspecified: Secondary | ICD-10-CM | POA: Diagnosis present

## 2017-10-12 DIAGNOSIS — F411 Generalized anxiety disorder: Secondary | ICD-10-CM | POA: Diagnosis present

## 2017-10-12 DIAGNOSIS — D638 Anemia in other chronic diseases classified elsewhere: Secondary | ICD-10-CM | POA: Diagnosis present

## 2017-10-12 DIAGNOSIS — Z66 Do not resuscitate: Secondary | ICD-10-CM | POA: Diagnosis present

## 2017-10-12 DIAGNOSIS — L8990 Pressure ulcer of unspecified site, unspecified stage: Secondary | ICD-10-CM

## 2017-10-12 DIAGNOSIS — G621 Alcoholic polyneuropathy: Secondary | ICD-10-CM | POA: Diagnosis present

## 2017-10-12 DIAGNOSIS — J302 Other seasonal allergic rhinitis: Secondary | ICD-10-CM | POA: Diagnosis present

## 2017-10-12 DIAGNOSIS — I129 Hypertensive chronic kidney disease with stage 1 through stage 4 chronic kidney disease, or unspecified chronic kidney disease: Secondary | ICD-10-CM | POA: Diagnosis present

## 2017-10-12 DIAGNOSIS — K767 Hepatorenal syndrome: Secondary | ICD-10-CM | POA: Diagnosis present

## 2017-10-12 DIAGNOSIS — F419 Anxiety disorder, unspecified: Secondary | ICD-10-CM | POA: Diagnosis present

## 2017-10-12 DIAGNOSIS — K7290 Hepatic failure, unspecified without coma: Principal | ICD-10-CM | POA: Diagnosis present

## 2017-10-12 DIAGNOSIS — J449 Chronic obstructive pulmonary disease, unspecified: Secondary | ICD-10-CM | POA: Diagnosis present

## 2017-10-12 DIAGNOSIS — Z7951 Long term (current) use of inhaled steroids: Secondary | ICD-10-CM

## 2017-10-12 DIAGNOSIS — D696 Thrombocytopenia, unspecified: Secondary | ICD-10-CM | POA: Diagnosis present

## 2017-10-12 DIAGNOSIS — E860 Dehydration: Secondary | ICD-10-CM | POA: Diagnosis present

## 2017-10-12 DIAGNOSIS — Z9181 History of falling: Secondary | ICD-10-CM | POA: Diagnosis not present

## 2017-10-12 DIAGNOSIS — Z825 Family history of asthma and other chronic lower respiratory diseases: Secondary | ICD-10-CM | POA: Diagnosis not present

## 2017-10-12 DIAGNOSIS — W19XXXA Unspecified fall, initial encounter: Secondary | ICD-10-CM | POA: Diagnosis present

## 2017-10-12 DIAGNOSIS — E875 Hyperkalemia: Secondary | ICD-10-CM | POA: Diagnosis present

## 2017-10-12 DIAGNOSIS — Z9119 Patient's noncompliance with other medical treatment and regimen: Secondary | ICD-10-CM

## 2017-10-12 DIAGNOSIS — N179 Acute kidney failure, unspecified: Secondary | ICD-10-CM | POA: Diagnosis present

## 2017-10-12 DIAGNOSIS — Z79891 Long term (current) use of opiate analgesic: Secondary | ICD-10-CM

## 2017-10-12 DIAGNOSIS — Z8249 Family history of ischemic heart disease and other diseases of the circulatory system: Secondary | ICD-10-CM

## 2017-10-12 DIAGNOSIS — I878 Other specified disorders of veins: Secondary | ICD-10-CM | POA: Diagnosis present

## 2017-10-12 DIAGNOSIS — Z515 Encounter for palliative care: Secondary | ICD-10-CM

## 2017-10-12 DIAGNOSIS — Z7189 Other specified counseling: Secondary | ICD-10-CM

## 2017-10-12 DIAGNOSIS — Z79899 Other long term (current) drug therapy: Secondary | ICD-10-CM

## 2017-10-12 DIAGNOSIS — K219 Gastro-esophageal reflux disease without esophagitis: Secondary | ICD-10-CM | POA: Diagnosis present

## 2017-10-12 DIAGNOSIS — N182 Chronic kidney disease, stage 2 (mild): Secondary | ICD-10-CM | POA: Diagnosis present

## 2017-10-12 DIAGNOSIS — S92514A Nondisplaced fracture of proximal phalanx of right lesser toe(s), initial encounter for closed fracture: Secondary | ICD-10-CM | POA: Diagnosis present

## 2017-10-12 DIAGNOSIS — K7682 Hepatic encephalopathy: Secondary | ICD-10-CM

## 2017-10-12 DIAGNOSIS — K7210 Chronic hepatic failure without coma: Secondary | ICD-10-CM

## 2017-10-12 DIAGNOSIS — F4321 Adjustment disorder with depressed mood: Secondary | ICD-10-CM | POA: Diagnosis present

## 2017-10-12 DIAGNOSIS — F10239 Alcohol dependence with withdrawal, unspecified: Secondary | ICD-10-CM | POA: Diagnosis present

## 2017-10-12 DIAGNOSIS — Y92009 Unspecified place in unspecified non-institutional (private) residence as the place of occurrence of the external cause: Secondary | ICD-10-CM

## 2017-10-12 LAB — CBC WITH DIFFERENTIAL/PLATELET
BASOS PCT: 1 %
Basophils Absolute: 0.2 10*3/uL — ABNORMAL HIGH (ref 0–0.1)
Eosinophils Absolute: 0.2 10*3/uL (ref 0–0.7)
Eosinophils Relative: 1 %
HEMATOCRIT: 32.9 % — AB (ref 40.0–52.0)
HEMOGLOBIN: 11.2 g/dL — AB (ref 13.0–18.0)
LYMPHS PCT: 3 %
Lymphs Abs: 0.5 10*3/uL — ABNORMAL LOW (ref 1.0–3.6)
MCH: 38.6 pg — ABNORMAL HIGH (ref 26.0–34.0)
MCHC: 34 g/dL (ref 32.0–36.0)
MCV: 113.4 fL — AB (ref 80.0–100.0)
MONO ABS: 1.5 10*3/uL — AB (ref 0.2–1.0)
MONOS PCT: 9 %
NEUTROS ABS: 14.5 10*3/uL — AB (ref 1.4–6.5)
NEUTROS PCT: 86 %
Platelets: 110 10*3/uL — ABNORMAL LOW (ref 150–440)
RBC: 2.9 MIL/uL — ABNORMAL LOW (ref 4.40–5.90)
RDW: 14.3 % (ref 11.5–14.5)
WBC: 17 10*3/uL — ABNORMAL HIGH (ref 3.8–10.6)

## 2017-10-12 LAB — COMPREHENSIVE METABOLIC PANEL
ALK PHOS: 194 U/L — AB (ref 38–126)
ALT: 23 U/L (ref 0–44)
ANION GAP: 11 (ref 5–15)
AST: 62 U/L — ABNORMAL HIGH (ref 15–41)
Albumin: 2.6 g/dL — ABNORMAL LOW (ref 3.5–5.0)
BILIRUBIN TOTAL: 1.7 mg/dL — AB (ref 0.3–1.2)
BUN: 28 mg/dL — ABNORMAL HIGH (ref 8–23)
CALCIUM: 8.7 mg/dL — AB (ref 8.9–10.3)
CO2: 27 mmol/L (ref 22–32)
CREATININE: 3.35 mg/dL — AB (ref 0.61–1.24)
Chloride: 93 mmol/L — ABNORMAL LOW (ref 98–111)
GFR calc non Af Amer: 18 mL/min — ABNORMAL LOW (ref >60)
GFR, EST AFRICAN AMERICAN: 21 mL/min — AB (ref >60)
Glucose, Bld: 97 mg/dL (ref 70–99)
Potassium: 5 mmol/L (ref 3.5–5.1)
Sodium: 131 mmol/L — ABNORMAL LOW (ref 135–145)
TOTAL PROTEIN: 6.9 g/dL (ref 6.5–8.1)

## 2017-10-12 LAB — PROTIME-INR
INR: 1.15
Prothrombin Time: 14.6 seconds (ref 11.4–15.2)

## 2017-10-12 LAB — SALICYLATE LEVEL: Salicylate Lvl: <7 mg/dL (ref 2.8–30.0)

## 2017-10-12 LAB — ETHANOL: Alcohol, Ethyl (B): 12 mg/dL — ABNORMAL HIGH (ref <10)

## 2017-10-12 LAB — ACETAMINOPHEN LEVEL: Acetaminophen (Tylenol), Serum: <10 ug/mL — ABNORMAL LOW (ref 10–30)

## 2017-10-12 LAB — AMMONIA: Ammonia: 90 umol/L — ABNORMAL HIGH (ref 9–35)

## 2017-10-12 LAB — LIPASE, BLOOD: Lipase: 44 U/L (ref 11–51)

## 2017-10-12 MED ORDER — VITAMIN B-1 100 MG PO TABS
100.0000 mg | ORAL_TABLET | Freq: Every day | ORAL | Status: DC
  Filled 2017-10-12: qty 1

## 2017-10-12 MED ORDER — NICOTINE 21 MG/24HR TD PT24
21.0000 mg | MEDICATED_PATCH | Freq: Every day | TRANSDERMAL | Status: DC
  Administered 2017-10-12: 21 mg via TRANSDERMAL
  Filled 2017-10-12: qty 1

## 2017-10-12 MED ORDER — SODIUM CHLORIDE 0.9 % IV SOLN
INTRAVENOUS | Status: AC
  Administered 2017-10-12: 14:00:00 via INTRAVENOUS

## 2017-10-12 MED ORDER — METRONIDAZOLE IN NACL 5-0.79 MG/ML-% IV SOLN
500.0000 mg | Freq: Once | INTRAVENOUS | Status: AC
  Administered 2017-10-12: 500 mg via INTRAVENOUS
  Filled 2017-10-12: qty 100

## 2017-10-12 MED ORDER — ACETAMINOPHEN 650 MG RE SUPP: 650.0000 mg | Freq: Four times a day (QID) | RECTAL | Status: DC | PRN

## 2017-10-12 MED ORDER — LORAZEPAM 2 MG/ML IJ SOLN
1.0000 mg | INTRAMUSCULAR | Status: DC | PRN
  Administered 2017-10-13: 1 mg via INTRAVENOUS
  Filled 2017-10-12: qty 1

## 2017-10-12 MED ORDER — LORAZEPAM 2 MG/ML IJ SOLN: 0.0000 mg | Freq: Two times a day (BID) | INTRAMUSCULAR | Status: DC

## 2017-10-12 MED ORDER — ONDANSETRON HCL 4 MG/2ML IJ SOLN: 4.0000 mg | Freq: Four times a day (QID) | INTRAMUSCULAR | Status: DC | PRN

## 2017-10-12 MED ORDER — LORAZEPAM 2 MG PO TABS: 0.0000 mg | ORAL_TABLET | Freq: Four times a day (QID) | ORAL | Status: DC

## 2017-10-12 MED ORDER — ACETAMINOPHEN 325 MG PO TABS: 650.0000 mg | ORAL_TABLET | Freq: Four times a day (QID) | ORAL | Status: DC | PRN

## 2017-10-12 MED ORDER — LORAZEPAM 2 MG/ML IJ SOLN: 0.0000 mg | Freq: Four times a day (QID) | INTRAMUSCULAR | Status: DC

## 2017-10-12 MED ORDER — LACTULOSE 10 GM/15ML PO SOLN: 20.0000 g | Freq: Once | ORAL | Status: DC

## 2017-10-12 MED ORDER — LORAZEPAM 2 MG/ML IJ SOLN
2.0000 mg | Freq: Once | INTRAMUSCULAR | Status: AC
  Administered 2017-10-12: 2 mg via INTRAVENOUS
  Filled 2017-10-12: qty 1

## 2017-10-12 MED ORDER — LORAZEPAM 2 MG PO TABS: 0.0000 mg | ORAL_TABLET | Freq: Two times a day (BID) | ORAL | Status: DC

## 2017-10-12 MED ORDER — CEFTRIAXONE SODIUM 2 G IJ SOLR
2.0000 g | Freq: Once | INTRAMUSCULAR | Status: AC
  Administered 2017-10-12: 2 g via INTRAVENOUS
  Filled 2017-10-12: qty 20

## 2017-10-12 MED ORDER — ONDANSETRON HCL 4 MG PO TABS: 4.0000 mg | ORAL_TABLET | Freq: Four times a day (QID) | ORAL | Status: DC | PRN

## 2017-10-12 MED ORDER — THIAMINE HCL 100 MG/ML IJ SOLN
100.0000 mg | Freq: Every day | INTRAMUSCULAR | Status: DC
  Administered 2017-10-12: 100 mg via INTRAVENOUS
  Filled 2017-10-12: qty 2

## 2017-10-12 MED ORDER — MORPHINE SULFATE (PF) 2 MG/ML IV SOLN: 2.0000 mg | INTRAVENOUS | Status: DC | PRN

## 2017-10-12 MED ORDER — SODIUM CHLORIDE 0.9 % IV BOLUS
1000.0000 mL | Freq: Once | INTRAVENOUS | Status: AC
  Administered 2017-10-12: 1000 mL via INTRAVENOUS

## 2017-10-12 NOTE — Progress Notes (Signed)
RNCM consulted for home with hospice needs. Spouse Dois DavenportSandra [phone] is at bedside. Dois DavenportSandra preference is to take the patient home with hospice services. She tells me that her PCP has began workup for these services. Patient currently in need of a hospital bed, wheelchair, and bed side commode. Will place referral with [facility].

## 2017-10-12 NOTE — ED Notes (Signed)
Pts daughter: Nelle DonKerrie Rund - [phone]- [phone]

## 2017-10-12 NOTE — Clinical Social Work Note (Signed)
Clinical Social Work Assessment  Patient Details  Name: Devin Bryant MRN: [id number] Date of Birth: 10-30-1955  Date of referral:  10/20/2017               Reason for consult:  Family Concerns, End of Life/Hospice                Permission sought to share information with:  Family Supports Permission granted to share information::  Yes, Verbal Permission Granted  Name::     Devin Bryant [phone] ( wife)  Agency::     Relationship::     Contact Information:     Housing/Transportation Living arrangements for the past 2 months:  Single Family Home Source of Information:  Spouse Patient Interpreter Needed:  None Criminal Activity/Legal Involvement Pertinent to Current Situation/Hospitalization:    Significant Relationships:  Spouse, Adult Children Lives with:  Spouse Do you feel safe going back to the place where you live?  Yes Need for family participation in patient care:  Yes (Comment)  Care giving concerns:  Wife was provided Elder care family resource handout   Social Worker assessment / plan: LCSW introduced myself to patient and his wife. Patient was unresponsive at this time and EDP hospitals was meeting with them so unable to collect data. Provided family care provider list and will follow up with wife once hospitalist completes his note: Copied from EDP Devin HusbandsDavid Bryant is a 62 y.o. 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  Wife reports that he had a paracentesis 1 week ago, which has rapidly reaccumulated, resulting in leaking from the puncture site at his right lower quadrant abdomen.  Patient has had daily falls over the past several weeks, including this morning.  He did hit his head.  Denies loss of consciousness or neck pain.  No vision changes paresthesias or weakness.  Normally drinks about 50 to 60 ounces of  vodka daily.  Last drink was last night.  Does feel shaky.  Denies any pain except for occipital headache.  Has healthcare POA in the EMR dated September 23, 2017.  Wife at bedside confirms crackles of care include hospice referral and comfort care without aggressive measures.  CODE STATUS DNR.   Employment status:  Retired Health and safety inspectornsurance information:  Other (Comment Required)(BCBS) PT Recommendations:    Information / Referral to community resources:     Patient/Family's Response to care: TBD  Patient/Family's Understanding of and Emotional Response to Diagnosis, Current Treatment, and Prognosis:  TBD  Emotional Assessment Appearance:  Appears stated age Attitude/Demeanor/Rapport:  Unable to Assess(Altered mental Status) Affect (typically observed):  Unable to Assess(Altered mental status) Orientation:  Oriented to Self Alcohol / Substance use:  Alcohol Use Psych involvement (Current and /or in the community):  No (Comment)  Discharge Needs  Concerns to be addressed:  Care Coordination Readmission within the last 30 days:  No Current discharge risk:  Chronically ill, Substance Abuse Barriers to Discharge:  Continued Medical Work up   WilderBandi, Ninfa MeekerClaudine M, LCSW 10/16/2017, 11:57 AM

## 2017-10-12 NOTE — ED Notes (Signed)
.  Labs sent at this time. RN will monitor for results.   

## 2017-10-12 NOTE — H&P (Signed)
[employer] - [place] at [facility]   PATIENT NAME: Devin Bryant    MR#:  [id number]  DATE OF BIRTH:  05-05-1955  DATE OF ADMISSION:  11-06-17  PRIMARY CARE PHYSICIAN: Dorcas Carrow, DO   REQUESTING/REFERRING PHYSICIAN: Dr. Sharman Cheek  CHIEF COMPLAINT:   Chief Complaint  Patient presents with  . Fall    HISTORY OF PRESENT ILLNESS:  Domingo Fuson  is a 62 y.o. male with a known history of chronic liver disease secondary to alcohol abuse, COPD with ongoing tobacco abuse, chronic venous stasis, history of hepatic encephalopathy, ascites/depression who presents to the hospital from home due to a fall and also altered mental status.  Patient himself is somewhat encephalopathic therefore most of the history obtained from the wife at bedside.  As per the wife patient's health has been declining over the past month or so.  Patient is already followed at home with home health services and she has been seeking hospice services given his advanced liver disease and progressive decline.  Hospice has not been set up yet and there was supposed to come to her house this coming Monday but today patient had a fall and was profoundly weak and encephalopathic so she brought him to the hospital.  In the ER patient was noted to be severely encephalopathic with ammonia of 90.  He is arousable and follows simple commands but falls right back asleep.  He was also noted to be in acute kidney injury secondary to dehydration.  Patient continues to drink heavily and actually drank this morning prior to coming to the hospital.  He also gets repeated paracentesis and had one this past Monday, and his abdomen is distended again and he actually is leaking fluid from his previous paracentesis site.  Given his multiple comorbidities and multiple problems hospitalist services were contacted for admission.  PAST MEDICAL HISTORY:   Past Medical History:  Diagnosis Date  . Adjustment disorder with  depressed mood   . Alcohol abuse 01/17/2016  . Allergy   . Anxiety   . Cellulitis of arm, left 08/18/2016  . COPD (chronic obstructive pulmonary disease) (HCC)   . ED (erectile dysfunction)   . Elevated glucose   . Elevated serum glutamic pyruvic transaminase (SGPT) level   . Emphysema lung (HCC)   . GERD (gastroesophageal reflux disease)   . Hypertension   . Hypertriglyceridemia   . Liver disease   . Neuropathy   . Tobacco use     PAST SURGICAL HISTORY:   Past Surgical History:  Procedure Laterality Date  . EYE SURGERY     cataract surgery  . KNEE SURGERY     arthroscopic  . SEPTOPLASTY  Feb 2016  . TONSILLECTOMY      SOCIAL HISTORY:   Social History   Tobacco Use  . Smoking status: Current Every Day Smoker    Packs/day: 1.00    Years: 40.00    Pack years: 40.00    Types: Cigarettes  . Smokeless tobacco: Never Used  Substance Use Topics  . Alcohol use: Yes    Comment: daughter states patient has been drinking heavily for the past three years with increased drinking over the past 6 months.    FAMILY HISTORY:   Family History  Problem Relation Age of Onset  . Diabetes Mother   . CAD Mother   . Cancer Father        lung  . Heart disease Father   . Heart attack Father   .  COPD Sister   . Heart disease Brother   . Heart attack Brother   . COPD Brother   . Hypertension Neg Hx   . Stroke Neg Hx     DRUG ALLERGIES:  No Known Allergies  REVIEW OF SYSTEMS:   Review of Systems  Constitutional: Positive for malaise/fatigue. Negative for fever and weight loss.  HENT: Negative for congestion, nosebleeds and tinnitus.   Eyes: Negative for blurred vision, double vision and redness.  Respiratory: Negative for cough, hemoptysis and shortness of breath.   Cardiovascular: Positive for leg swelling. Negative for chest pain, orthopnea and PND.  Gastrointestinal: Negative for diarrhea, melena, nausea and vomiting.       Abdominal distension.   Genitourinary:  Negative for dysuria, hematuria and urgency.  Musculoskeletal: Positive for falls. Negative for joint pain.  Neurological: Positive for weakness. Negative for dizziness, tingling, sensory change, focal weakness, seizures and headaches.  Endo/Heme/Allergies: Negative for polydipsia. Does not bruise/bleed easily.  Psychiatric/Behavioral: Negative for depression and memory loss. The patient is not nervous/anxious.     MEDICATIONS AT HOME:   Prior to Admission medications   Medication Sig Start Date End Date Taking? Authorizing Provider  amitriptyline (ELAVIL) 25 MG tablet Take 1 tablet (25 mg total) by mouth at bedtime. 09/09/17  Yes Johnson, Megan P, DO  Cetirizine HCl (ZYRTEC ALLERGY) 10 MG CAPS Take 10 mg by mouth daily.   Yes [provider]  escitalopram (LEXAPRO) 10 MG tablet Take 1 tablet (10 mg total) by mouth daily. 09/09/17  Yes Johnson, Megan P, DO  esomeprazole (NEXIUM) 20 MG capsule Take 20 mg by mouth daily at 12 noon.   Yes [provider]  ferrous sulfate (FERROUSUL) 325 (65 FE) MG tablet Take 1 tablet (325 mg total) by mouth daily with breakfast. 09/10/17  Yes Johnson, Megan P, DO  fluconazole (DIFLUCAN) 100 MG tablet Take 1 tablet (100 mg total) by mouth daily. 05/07/17  Yes Johnson, Megan P, DO  fluticasone (FLONASE) 50 MCG/ACT nasal spray Place 1 spray into both nostrils daily.  07/23/14  Yes [provider]  furosemide (LASIX) 40 MG tablet Take 1 tablet (40 mg total) by mouth daily. 09/09/17  Yes Johnson, Megan P, DO  lactulose (CHRONULAC) 10 GM/15ML solution Take 15 mLs (10 g total) by mouth daily. 09/09/17  Yes Johnson, Megan P, DO  mirtazapine (REMERON) 30 MG tablet Take 1 tablet (30 mg total) by mouth at bedtime. 09/09/17  Yes Johnson, Megan P, DO  Multiple Vitamin (MULTIVITAMIN) tablet Take 1 tablet by mouth daily.   Yes [provider]  potassium chloride SA (KLOR-CON M20) 20 MEQ tablet Take 1 tablet (20 mEq total) by mouth daily. 09/09/17  Yes  Johnson, Megan P, DO  predniSONE (DELTASONE) 50 MG tablet Take 1 tablet (50 mg total) by mouth daily with breakfast. 09/09/17  Yes Johnson, Megan P, DO  pregabalin (LYRICA) 150 MG capsule Take 1 capsule (150 mg total) by mouth 3 (three) times daily. 09/09/17 09/09/18 Yes Johnson, Megan P, DO  rifaximin (XIFAXAN) 550 MG TABS tablet Take 1 tablet (550 mg total) by mouth 2 (two) times daily. 10/07/17  Yes Wyline Mood, MD  Roflumilast (DALIRESP) 250 MCG TABS Take 1 tablet by mouth daily.  01/22/17  Yes [provider]  spironolactone (ALDACTONE) 100 MG tablet Take 1 tablet (100 mg total) by mouth daily. 09/09/17  Yes Johnson, Megan P, DO  tiotropium (SPIRIVA HANDIHALER) 18 MCG inhalation capsule Place 1 capsule (18 mcg total) into inhaler and  inhale daily. 09/09/17  Yes Johnson, Megan P, DO  ADVAIR HFA 115-21 MCG/ACT inhaler Inhale 2 puffs into the lungs 2 (two) times daily. 09/09/17   Johnson, Megan P, DO  albuterol (PROVENTIL HFA;VENTOLIN HFA) 108 (90 Base) MCG/ACT inhaler Inhale 2 puffs into the lungs every 6 (six) hours as needed for wheezing or shortness of breath. 09/09/17   Johnson, Megan P, DO  albuterol (PROVENTIL) (2.5 MG/3ML) 0.083% nebulizer solution Take 3 mLs (2.5 mg total) by nebulization every 4 (four) hours as needed for wheezing or shortness of breath. 09/09/17   Johnson, Megan P, DO  feeding supplement, ENSURE ENLIVE, (ENSURE ENLIVE) LIQD Take 237 mLs by mouth 3 (three) times daily between meals. 12/18/16   Adrian Saran, MD  loperamide (IMODIUM) 2 MG capsule Take 1 capsule (2 mg total) by mouth every 6 (six) hours as needed for diarrhea or loose stools. 08/20/16   Auburn Bilberry, MD  mometasone (ELOCON) 0.1 % ointment Apply topically daily. 06/18/17   Johnson, Megan P, DO  nystatin (MYCOSTATIN) 100000 UNIT/ML suspension Take 5 mLs (500,000 Units total) by mouth 2 (two) times daily. Uses with his inhalers 05/07/17   Olevia Perches P, DO  ondansetron (ZOFRAN ODT) 4 MG disintegrating tablet Take 1  tablet (4 mg total) by mouth every 8 (eight) hours as needed for nausea or vomiting. 09/09/17   Johnson, Megan P, DO  traMADol (ULTRAM) 50 MG tablet Take 1 tablet (50 mg total) by mouth every 12 (twelve) hours as needed for up to 28 days. Patient not taking: Reported on Nov 05, 2017 10/07/17 11/04/17  Olevia Perches P, DO  traMADol (ULTRAM) 50 MG tablet Take 1 tablet (50 mg total) by mouth every 12 (twelve) hours as needed for up to 28 days. 11/04/17 12/02/17  Olevia Perches P, DO      VITAL SIGNS:  Blood pressure (!) 89/61, pulse (!) 106, temperature 98.2 F (36.8 C), temperature source Oral, resp. rate 18, height 5\' 10"  (1.778 m), weight 83.5 kg, SpO2 94 %.  PHYSICAL EXAMINATION:  Physical Exam  GENERAL:  62 y.o.-year-old patient lying in the bed lethargic/encephalopathic but follows commands.  EYES: Pupils equal, round, reactive to light. + scleral icterus. Extraocular muscles intact.  HEENT: Head atraumatic, normocephalic. Oropharynx and nasopharynx clear. No oropharyngeal erythema, moist oral mucosa.  Spider nevi noted on the face NECK:  Supple, no jugular venous distention. No thyroid enlargement, no tenderness.  LUNGS: Poor Resp. effort, no wheezing, rales, + upper airway rhonchi. No use of accessory muscles of respiration.  CARDIOVASCULAR: S1, S2 RRR. No murmurs, rubs, gallops, clicks.  ABDOMEN: Soft, nontender, distended, + fluid wave consistent with ascites.. Bowel sounds present. No organomegaly or mass.  Spider nevi on the abdomen EXTREMITIES: +2 edema b/l, No cyanosis, or clubbing. + 2 pedal & radial pulses b/l.  Signs of chronic venous stasis bilaterally. NEUROLOGIC: Cranial nerves II through XII are intact. No focal Motor or sensory deficits appreciated b/l.  Globally weak/encephalopathic PSYCHIATRIC: The patient is alert and oriented x 1.  Lethargic/encephalopathic SKIN: No obvious rash, lesion, or ulcer.   LABORATORY PANEL:   CBC Recent Labs  Lab 11/05/2017 0940  WBC 17.0*  HGB  11.2*  HCT 32.9*  PLT 110*   ------------------------------------------------------------------------------------------------------------------  Chemistries  Recent Labs  Lab 2017-11-05 0940  NA 131*  K 5.0  CL 93*  CO2 27  GLUCOSE 97  BUN 28*  CREATININE 3.35*  CALCIUM 8.7*  AST 62*  ALT 23  ALKPHOS 194*  BILITOT 1.7*   ------------------------------------------------------------------------------------------------------------------  Cardiac Enzymes No results for input(s): TROPONINI in the last 168 hours. ------------------------------------------------------------------------------------------------------------------  RADIOLOGY:  Dg Chest 1 View  Result Date: 15-Jul-2017 CLINICAL DATA:  Weakness and tachycardia. EXAM: CHEST  1 VIEW COMPARISON:  September 28, 2017 FINDINGS: Atelectasis in the bases. No pneumothorax. The heart, hila, mediastinum, lungs, and pleura are otherwise unchanged and unremarkable. IMPRESSION: Bibasilar atelectasis. Lungs otherwise clear. Electronically Signed   By: Gerome Samavid  Williams III M.D   On: 013-May-2019 09:47   Ct Head Wo Contrast  Result Date: 15-Jul-2017 CLINICAL DATA:  Reoccurring falls. EXAM: CT HEAD WITHOUT CONTRAST TECHNIQUE: Contiguous axial images were obtained from the base of the skull through the vertex without intravenous contrast. COMPARISON:  December 18, 2016 FINDINGS: Brain: No subdural, epidural, or subarachnoid hemorrhage. Cerebellum, brainstem, and basal cisterns are normal. Ventricles and sulci are unremarkable. Mild white matter changes. No acute cortical ischemia infarct. No mass effect or midline shift. Vascular: Calcified atherosclerosis in the intracranial carotids. Skull: Normal. Negative for fracture or focal lesion. Sinuses/Orbits: No acute finding. Other: None. IMPRESSION: No acute abnormalities. Electronically Signed   By: Gerome Samavid  Williams III M.D   On: 013-May-2019 10:57   Dg Foot Complete Right  Result Date: 15-Jul-2017 CLINICAL  DATA:  Right foot swelling without redness. No trauma. EXAM: RIGHT FOOT COMPLETE - 3+ VIEW COMPARISON:  None. FINDINGS: Soft tissue swelling seen over the top of the foot. There is a mildly displaced fracture at the base of the proximal fifth phalanx which is age indeterminate but suggestive of and acute to subacute fracture. No other definitive fractures noted. IMPRESSION: 1. There is a fracture at the base of the proximal fifth phalanx which is age indeterminate but suspicious for an acute or subacute fracture. 2. Significant soft tissue swelling over the top of the foot. Electronically Signed   By: Gerome Samavid  Williams III M.D   On: 013-May-2019 09:45     IMPRESSION AND PLAN:   62 year old male with past medical history of chronic liver disease secondary to alcohol abuse, history of hepatic encephalopathy, recurrent ascites with paracentesis, alcohol withdrawal who presents to the hospital due to altered mental status, weakness, fall and noted to be in acute on chronic renal failure with hepatic encephalopathy.  1.  Chronic liver disease-secondary to alcohol abuse. 2.  Hepatic encephalopathy 3.  Acute on chronic renal failure 4.  Anemia of chronic disease 5.  Thrombocytopenia 6.  COPD with ongoing tobacco abuse  Given patient's multiple comorbidities, ongoing tobacco, alcohol abuse and poor prognosis I had an extensive discussion with the patient and patient's wife about goals of care.  Patient was already going to be referred to hospice services as an outpatient.  Patient is already a DNR and family does not want to pursue any further aggressive care and wants to keep the patient comfortable. - Patient is being admitted under comfort care only, will get palliative care consult/care management consult for home with hospice services. - We will place the patient on Ativan, morphine as needed.   All the records are reviewed and case discussed with ED provider. Management plans discussed with the  patient, family and they are in agreement.  CODE STATUS: DNR  TOTAL TIME TAKING CARE OF THIS PATIENT: 45 minutes.    Houston SirenSAINANI,Kysen Wetherington J M.D on 15-Jul-2017 at 12:19 PM  Between 7am to 6pm - Pager - [phone]  After 6pm go to [email or web address] - password [email or web address] [facility]  [facility]  Office  [phone]  CC: Primary care physician; Dorcas CarrowJohnson, Megan P,  DO

## 2017-10-12 NOTE — Clinical Social Work Note (Signed)
CSW received consult for hospice referral. CSW has called the admissions coordinator of [facility] to begin referral process and has left a message for call back. CSW will update as information is available.  Argentina PonderKaren Martha Yanina Knupp, MSW, Theresia MajorsLCSWA [phone]

## 2017-10-12 NOTE — ED Triage Notes (Addendum)
Pt presents today via [facility] from home for re-occurring falls. Pt lives with wife. Pt had a paracentesis done on 10/04/17, site is leaking. EMS reports wife needs help with care for pt. Today pt slipped on drainage from site and fell. Pt denies LOC.

## 2017-10-12 NOTE — ED Notes (Signed)
Patient transported to room 136

## 2017-10-12 NOTE — Progress Notes (Signed)
Pt arrived to room 136 from ED. Pt alert to self but disoriented to time, place and situation. Wife at bedside. Nasal cannula 2 L, Dressing to right lower abdomen, (per wife paracentesis on  10/09/17).  Bed in lowest position call bell in reach and bed alarm on.

## 2017-10-12 NOTE — Progress Notes (Signed)
   [employer] - [place] at [facility]   Advance care planning  Hospital Day: 0 days Devin HusbandsDavid Bryant is a 62 y.o. male presenting with Fall .  Patient has a history of chronic alcoholic liver disease, alcohol abuse, COPD with ongoing tobacco abuse, history of hepatic encephalopathy, history of recurrent ascites who presented to the hospital due to altered mental status and also fall.  Patient has significant comorbidities his prognosis is quite poor and I had extensive discussion with the patient's wife about his goals of care.  Patient's wife was already considering hospice services at home but has not been initiated.  Patient will now be admitted under comfort care only and will have care management address home hospice needs.  Advance care planning discussed with patient  with additional Family at bedside. All questions in regards to overall condition and expected prognosis answered. The decision was made to change current code status  CODE STATUS: Comfort care only Time spent: 16 minutes

## 2017-10-12 NOTE — ED Notes (Signed)
ED Provider at bedside. 

## 2017-10-12 NOTE — Clinical Social Work Note (Signed)
CSW met with the patient's spouse outside of the room to discuss hospice home vs. Home with hospice care planning. The patient's spouse shared that the plan is for the patient to return home with hospice once stable. The CSW explained the process and provided emotional support to the family. The patient's family would like home hospice services through [facility]. The CSW has advised the RNCM. The CSW will continue to follow should needs change to facility based care.  Santiago Bumpers, MSW, Latanya Presser [phone]

## 2017-10-12 NOTE — ED Provider Notes (Signed)
[facility] Emergency Department Provider Note  ____________________________________________  Time seen: Approximately 11:34 AM  I have reviewed the triage vital signs and the nursing notes.   HISTORY  Chief Complaint Fall  Level 5 Caveat: Portions of the History and Physical including HPI and review of systems are unable to be completely obtained due to patient being a poor historian due to altered mental status   HPI Devin Bryant is a 62 y.o. 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   Wife reports that he had a paracentesis 1 week ago, which has rapidly reaccumulated, resulting in leaking from the puncture site at his right lower quadrant abdomen.  Patient has had daily falls over the past several weeks, including this morning.  He did hit his head.  Denies loss of consciousness or neck pain.  No vision changes paresthesias or weakness.  Normally drinks about 50 to 60 ounces of vodka daily.  Last drink was last night.  Does feel shaky.  Denies any pain except for occipital headache.  Has healthcare POA in the EMR dated September 23, 2017.  Wife at bedside confirms crackles of care include hospice referral and comfort care without aggressive measures.  CODE STATUS DNR.    Past Medical History:  Diagnosis Date  . Adjustment disorder with depressed mood   . Alcohol abuse 01/17/2016  . Allergy   . Anxiety   . Cellulitis of arm, left 08/18/2016  . COPD (chronic obstructive pulmonary disease) (HCC)   . ED (erectile dysfunction)   . Elevated glucose   . Elevated serum glutamic pyruvic transaminase (SGPT) level   . Emphysema lung (HCC)   . GERD (gastroesophageal reflux disease)   . Hypertension   . Hypertriglyceridemia   . Liver disease   . Neuropathy   . Tobacco use      Patient Active Problem List    Diagnosis Date Noted  . Thrombocytopenia (HCC) 06/18/2017  . Advance directive discussed with patient 02/18/2017  . GERD (gastroesophageal reflux disease) 01/23/2017  . Tachycardia 01/18/2017  . Protein-calorie malnutrition, severe 12/18/2016  . Olecranon bursitis 04/19/2016  . Pain in elbow 04/19/2016  . Neuropathy 01/30/2016  . Acute on chronic respiratory failure with hypoxia and hypercapnia (HCC) 01/17/2016  . Nonsustained ventricular tachycardia (HCC) 01/17/2016  . Leukocytosis 01/17/2016  . History of tobacco abuse 01/17/2016  . Liver cirrhosis, alcoholic (HCC) 12/08/2015  . 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 Alcoholic hepatitis with ascites 10/10/2015  . Encephalopathy, hepatic (HCC) 10/10/2015  . Hypokalemia 10/10/2015  . Psoriasis 10/10/2015  . Alcohol dependence with withdrawal with complication (HCC)   . Macrocytosis without anemia 04/11/2015  . Other specified diseases of blood and blood-forming organs 04/11/2015  . Tobacco abuse 10/19/2014  . Generalized anxiety disorder 10/17/2014  . Allergy   . Hypertension   . COPD (chronic obstructive pulmonary disease) (HCC)   . ED (erectile dysfunction)   . Hypertriglyceridemia   . Elevated serum glutamic pyruvic transaminase (SGPT) level      Past Surgical History:  Procedure Laterality Date  . EYE SURGERY     cataract surgery  . KNEE SURGERY     arthroscopic  . SEPTOPLASTY  Feb 2016  . TONSILLECTOMY       Prior to Admission medications   Medication Sig Start Date End Date Taking? Authorizing Provider  amitriptyline (  ELAVIL) 25 MG tablet Take 1 tablet (25 mg total) by mouth at bedtime. 09/09/17  Yes Johnson, Megan P, DO  ADVAIR HFA 115-21 MCG/ACT inhaler Inhale 2 puffs into the lungs 2 (two) times daily. 09/09/17   Johnson, Megan P, DO  albuterol (PROVENTIL HFA;VENTOLIN HFA) 108 (90 Base) MCG/ACT inhaler Inhale 2 puffs into the lungs every 6 (six) hours as needed for wheezing or shortness of breath.  09/09/17   Johnson, Megan P, DO  albuterol (PROVENTIL) (2.5 MG/3ML) 0.083% nebulizer solution Take 3 mLs (2.5 mg total) by nebulization every 4 (four) hours as needed for wheezing or shortness of breath. 09/09/17   Johnson, Megan P, DO  albuterol (VENTOLIN HFA) 108 (90 Base) MCG/ACT inhaler Ventolin HFA 90 mcg/actuation aerosol inhaler    [provider]  Cetirizine HCl (ZYRTEC ALLERGY) 10 MG CAPS Take 10 mg by mouth daily.    [provider]  escitalopram (LEXAPRO) 10 MG tablet Take 1 tablet (10 mg total) by mouth daily. 09/09/17   Johnson, Megan P, DO  esomeprazole (NEXIUM) 20 MG capsule Take 20 mg by mouth daily at 12 noon.    [provider]  feeding supplement, ENSURE ENLIVE, (ENSURE ENLIVE) LIQD Take 237 mLs by mouth 3 (three) times daily between meals. 12/18/16   Adrian Saran, MD  ferrous sulfate (FERROUSUL) 325 (65 FE) MG tablet Take 1 tablet (325 mg total) by mouth daily with breakfast. 09/10/17   Olevia Perches P, DO  fluconazole (DIFLUCAN) 100 MG tablet Take 1 tablet (100 mg total) by mouth daily. 05/07/17   Johnson, Megan P, DO  fluticasone (FLONASE) 50 MCG/ACT nasal spray Place 1 spray into both nostrils daily.  07/23/14   [provider]  furosemide (LASIX) 40 MG tablet Take 1 tablet (40 mg total) by mouth daily. 09/09/17   Johnson, Megan P, DO  lactulose (CHRONULAC) 10 GM/15ML solution Take 15 mLs (10 g total) by mouth daily. 09/09/17   Olevia Perches P, DO  loperamide (IMODIUM) 2 MG capsule Take 1 capsule (2 mg total) by mouth every 6 (six) hours as needed for diarrhea or loose stools. 08/20/16   Auburn Bilberry, MD  mirtazapine (REMERON) 30 MG tablet Take 1 tablet (30 mg total) by mouth at bedtime. 09/09/17   Johnson, Megan P, DO  mometasone (ELOCON) 0.1 % ointment Apply topically daily. 06/18/17   Olevia Perches P, DO  Multiple Vitamin (MULTIVITAMIN) tablet Take 1 tablet by mouth daily.    [provider]  nystatin (MYCOSTATIN) 100000 UNIT/ML suspension Take  5 mLs (500,000 Units total) by mouth 2 (two) times daily. Uses with his inhalers 05/07/17   Olevia Perches P, DO  ondansetron (ZOFRAN ODT) 4 MG disintegrating tablet Take 1 tablet (4 mg total) by mouth every 8 (eight) hours as needed for nausea or vomiting. 09/09/17   Johnson, Megan P, DO  potassium chloride SA (KLOR-CON M20) 20 MEQ tablet Take 1 tablet (20 mEq total) by mouth daily. 09/09/17   Olevia Perches P, DO  prednisoLONE acetate (PRED FORTE) 1 % ophthalmic suspension  05/10/16   [provider]  predniSONE (DELTASONE) 50 MG tablet Take 1 tablet (50 mg total) by mouth daily with breakfast. 09/09/17   Olevia Perches P, DO  pregabalin (LYRICA) 150 MG capsule Take 1 capsule (150 mg total) by mouth 3 (three) times daily. 09/09/17 09/09/18  Olevia Perches P, DO  rifaximin (XIFAXAN) 550 MG TABS tablet Take 1 tablet (550 mg total) by mouth 2 (two) times daily. 10/07/17  Wyline Mood, MD  Roflumilast (DALIRESP) 250 MCG TABS Take by mouth. 01/22/17   [provider]  spironolactone (ALDACTONE) 100 MG tablet Take 1 tablet (100 mg total) by mouth daily. 09/09/17   Olevia Perches P, DO  theophylline (THEODUR) 300 MG 12 hr tablet  05/19/17   [provider]  tiotropium (SPIRIVA HANDIHALER) 18 MCG inhalation capsule Place 1 capsule (18 mcg total) into inhaler and inhale daily. 09/09/17   Johnson, Megan P, DO  traMADol (ULTRAM) 50 MG tablet Take 1 tablet (50 mg total) by mouth every 12 (twelve) hours as needed for up to 28 days. 10/07/17 11/04/17  Johnson, Megan P, DO  traMADol (ULTRAM) 50 MG tablet Take 1 tablet (50 mg total) by mouth every 12 (twelve) hours as needed for up to 28 days. 11/04/17 12/02/17  Olevia Perches P, DO     Allergies Patient has no known allergies.   Family History  Problem Relation Age of Onset  . Diabetes Mother   . CAD Mother   . Cancer Father        lung  . Heart disease Father   . Heart attack Father   . COPD Sister   . Heart disease Brother   . Heart attack Brother    . COPD Brother   . Hypertension Neg Hx   . Stroke Neg Hx     Social History Social History   Tobacco Use  . Smoking status: Current Every Day Smoker    Packs/day: 1.00    Years: 40.00    Pack years: 40.00    Types: Cigarettes  . Smokeless tobacco: Never Used  Substance Use Topics  . Alcohol use: Yes    Comment: daughter states patient has been drinking heavily for the past three years with increased drinking over the past 6 months.  . Drug use: No    Types: Marijuana    Review of Systems  Constitutional:   No fever or chills.  Cardiovascular:   No chest pain or syncope. Respiratory:   No dyspnea or cough. Gastrointestinal:   Negative for abdominal pain, vomiting and diarrhea.  No black or bloody stool Musculoskeletal:   Negative for focal pain or swelling.  Chronic neuropathy of bilateral feet All other systems reviewed and are negative except as documented above in ROS and HPI.  ____________________________________________   PHYSICAL EXAM:  VITAL SIGNS: ED Triage Vitals  Enc Vitals Group     BP Oct 14, 2017 0908 94/61     Pulse Rate 10/26/2017 0908 (!) 114     Resp 10/29/2017 0908 15     Temp 10/07/2017 0908 98.2 F (36.8 C)     Temp Source 10/26/2017 0908 Oral     SpO2 10/23/2017 0907 100 %     Weight October 14, 2017 0909 184 lb (83.5 kg)     Height 10/25/2017 0909 5\' 10"  (1.778 m)     Head Circumference --      Peak Flow --      Pain Score 14-Oct-2017 0908 2     Pain Loc --      Pain Edu? --      Excl. in GC? --     Vital signs reviewed, nursing assessments reviewed.   Constitutional:   Alert and oriented to person and place.  Ill-appearing Eyes:   Conjunctivae are normal. EOMI. PERRL. ENT      Head:   Normocephalic and atraumatic.      Nose:   No congestion/rhinnorhea.  Mouth/Throat:   Dry mucous membranes, no pharyngeal erythema. No peritonsillar mass.       Neck:   No meningismus. Full ROM. Hematological/Lymphatic/Immunilogical:   No cervical  lymphadenopathy. Cardiovascular:   Tachycardia heart rate 115. Symmetric bilateral radial and DP pulses.  No murmurs. Cap refill less than 2 seconds. Respiratory:   Normal respiratory effort without tachypnea/retractions. Breath sounds are clear and equal bilaterally. No wheezes/rales/rhonchi. Gastrointestinal:   Soft and nontender.  Severely distended with ascites. There is no CVA tenderness.  No rebound, rigidity, or guarding.  Clear fluid leakage from right lower quadrant puncture site. Musculoskeletal:   Normal range of motion in all extremities. No joint effusions.  No lower extremity tenderness.  No edema. Neurologic:   Normal speech and language.  Motor grossly intact. Fine tremor bilateral hands No acute focal neurologic deficits are appreciated.  Skin:    Skin is warm, dry and intact.  There are cherry angiomata and caput medusae on the abdomen.  Ecchymosis over the right fifth toe. ____________________________________________    LABS (pertinent positives/negatives) (all labs ordered are listed, but only abnormal results are displayed) Labs Reviewed  COMPREHENSIVE METABOLIC PANEL - Abnormal; Notable for the following components:      Result Value   Sodium 131 (*)    Chloride 93 (*)    BUN 28 (*)    Creatinine, Ser 3.35 (*)    Calcium 8.7 (*)    Albumin 2.6 (*)    AST 62 (*)    Alkaline Phosphatase 194 (*)    Total Bilirubin 1.7 (*)    GFR calc non Af Amer 18 (*)    GFR calc Af Amer 21 (*)    All other components within normal limits  ETHANOL - Abnormal; Notable for the following components:   Alcohol, Ethyl (B) 12 (*)    All other components within normal limits  ACETAMINOPHEN LEVEL - Abnormal; Notable for the following components:   Acetaminophen (Tylenol), Serum <10 (*)    All other components within normal limits  CBC WITH DIFFERENTIAL/PLATELET - Abnormal; Notable for the following components:   WBC 17.0 (*)    RBC 2.90 (*)    Hemoglobin 11.2 (*)    HCT 32.9 (*)     MCV 113.4 (*)    MCH 38.6 (*)    Platelets 110 (*)    Neutro Abs 14.5 (*)    Lymphs Abs 0.5 (*)    Monocytes Absolute 1.5 (*)    Basophils Absolute 0.2 (*)    All other components within normal limits  AMMONIA - Abnormal; Notable for the following components:   Ammonia 90 (*)    All other components within normal limits  URINE CULTURE  LIPASE, BLOOD  SALICYLATE LEVEL  PROTIME-INR  URINALYSIS, COMPLETE (UACMP) WITH MICROSCOPIC   ____________________________________________   EKG    ____________________________________________    RADIOLOGY  Dg Chest 1 View  Result Date: 10/23/2017 CLINICAL DATA:  Weakness and tachycardia. EXAM: CHEST  1 VIEW COMPARISON:  September 28, 2017 FINDINGS: Atelectasis in the bases. No pneumothorax. The heart, hila, mediastinum, lungs, and pleura are otherwise unchanged and unremarkable. IMPRESSION: Bibasilar atelectasis. Lungs otherwise clear. Electronically Signed   By: Gerome Samavid  Williams III M.D   On: 10/04/2017 09:47   Ct Head Wo Contrast  Result Date: 10/24/2017 CLINICAL DATA:  Reoccurring falls. EXAM: CT HEAD WITHOUT CONTRAST TECHNIQUE: Contiguous axial images were obtained from the base of the skull through the vertex without intravenous contrast. COMPARISON:  December 18, 2016  FINDINGS: Brain: No subdural, epidural, or subarachnoid hemorrhage. Cerebellum, brainstem, and basal cisterns are normal. Ventricles and sulci are unremarkable. Mild white matter changes. No acute cortical ischemia infarct. No mass effect or midline shift. Vascular: Calcified atherosclerosis in the intracranial carotids. Skull: Normal. Negative for fracture or focal lesion. Sinuses/Orbits: No acute finding. Other: None. IMPRESSION: No acute abnormalities. Electronically Signed   By: Gerome Sam III M.D   On: 10/20/2017 10:57   Dg Foot Complete Right  Result Date: 10/27/2017 CLINICAL DATA:  Right foot swelling without redness. No trauma. EXAM: RIGHT FOOT COMPLETE - 3+ VIEW  COMPARISON:  None. FINDINGS: Soft tissue swelling seen over the top of the foot. There is a mildly displaced fracture at the base of the proximal fifth phalanx which is age indeterminate but suggestive of and acute to subacute fracture. No other definitive fractures noted. IMPRESSION: 1. There is a fracture at the base of the proximal fifth phalanx which is age indeterminate but suspicious for an acute or subacute fracture. 2. Significant soft tissue swelling over the top of the foot. Electronically Signed   By: Gerome Sam III M.D   On: 10/16/2017 09:45    ____________________________________________   PROCEDURES .Critical Care Performed by: Sharman Cheek, MD Authorized by: Sharman Cheek, MD   Critical care provider statement:    Critical care time was exclusive of:  Separately billable procedures and treating other patients   Critical care was necessary to treat or prevent imminent or life-threatening deterioration of the following conditions:  CNS failure or compromise, renal failure, hepatic failure and toxidrome   Critical care was time spent personally by me on the following activities:  Development of treatment plan with patient or surrogate, discussions with consultants, evaluation of patient's response to treatment, examination of patient, obtaining history from patient or surrogate, ordering and performing treatments and interventions, ordering and review of laboratory studies, ordering and review of radiographic studies, pulse oximetry, re-evaluation of patient's condition and review of old charts    ____________________________________________  DIFFERENTIAL DIAGNOSIS   Hepatic encephalopathy, subdural hematoma, epidural hematoma, dehydration, SBP, alcohol withdrawal, toe fracture  CLINICAL IMPRESSION / ASSESSMENT AND PLAN / ED COURSE  Pertinent labs & imaging results that were available during my care of the patient were reviewed by me and considered in my medical  decision making (see chart for details).      Clinical Course as of Oct 12 1132  Sat Oct 12, 2017  1610 Fall, weakness. Tachycardia. Severe ascites. Recent evaluation for ascites, had para 1 week ago. Has been referred to hospice. Will recheck labs today, cxr for copd, foot xr for bruised 5th toe.    [PS]  1036 Hb stable. Leukocytosis. Possible SBP sv UTI. Doubt sepsis. Ammonia high c/w hepatic encephalopathy. With acute on chronic renal failure, pt has hepatorenal syndrome. Will give ceftriaxone and flagyl, plan to admit for further management   Hemoglobin(!): 11.2 [PS]  1109 D/w Dr. Norma Fredrickson with GI. Agrees with usual medical management until hospice eval and enrollment can be completed. Would need therapeutic para with sx relief.    [PS]  1132 D/w hospitalist. Family updated   [PS]    Clinical Course User Index [PS] Sharman Cheek, MD    ----------------------------------------- 11:43 AM on 10/18/2017 -----------------------------------------  Patient given Ativan IV for alcohol withdrawal symptoms.  Start CIWA protocol.   ____________________________________________   FINAL CLINICAL IMPRESSION(S) / ED DIAGNOSES    Final diagnoses:  Fall in home, initial encounter  End stage liver disease (  HCC)  Alcoholic cirrhosis of liver with ascites (HCC)  Acute renal failure, unspecified acute renal failure type [facility])  Hepatic encephalopathy [facility]     ED Discharge Orders    None      Portions of this note were generated with dragon dictation software. Dictation errors may occur despite best attempts at proofreading.    Sharman Cheek, MD 2017/10/25 1143

## 2017-10-12 NOTE — ED Notes (Signed)
Admitting MD at bedside.

## 2017-10-12 NOTE — Consult Note (Signed)
Re:   Devin Bryant DOB:   [birth year] MRN:   [id number]  ASSESSMENT AND PLAN:  Fall in home, initial encounter  End stage liver disease (HCC)  Alcoholic cirrhosis of liver with ascites (HCC)  Acute renal failure, unspecified acute renal failure type (HCC)  Hepatic encephalopathy (HCC)  Closed nondisplaced fracture of proximal phalanx of lesser toe of right foot, initial encounter  DNR Status - Patient with multiple comorbidities including COPD, Acute on chronic renal failure. Patient followed by gastroenterologist, DR. Tobi Bastosnna, who has facilitated optimal medical treatment but also has pursued hospice care per patient wishes.    Care plans noted as per hospitalist note.  We will provide care in the interim as needed or as requested by the patient's family and attending physician.  Please call us back if we can help.  Chief Complaint  Patient presents with  . Fall   REFERRING PHYSICIAN: Dorcas CarrowJohnson, Megan P, DO  HISTORY OF PRESENT ILLNESS: Devin Bryant is a 62 y.o. (DOB: [birth year]) male whose primary care physician is Dorcas CarrowJohnson, Megan P, DO and comes to me today for increased confusion and fall at home.  Patient is followed by Dr. Leory PlowmanKaren Anna, gastroenterologist at [facility].  The patient has been largely noncompliant with alcohol abstinence.  Patient name unable to give a comprehensive history, therefore patient's wife is able to provide most of the details of remote and recent medical status. The patient's wife says the patient has had failed attempts at alcohol rehabilitation in the past.  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 Patient underwent a paracentesis last week but had had rapid reaccumulation of ascitic fluid and had leaked peritoneal fluid on the floor at his residence.  He apparently fell and ascitic fluid and hit his head.  CT head showed no evidence of acute intracranial bleed, however.      Current  Facility-Administered Medications  Medication Dose Route Frequency Provider Last Rate Last Dose  . 0.9 %  sodium chloride infusion   Intravenous Continuous Sainani, Rolly PancakeVivek J, MD      . acetaminophen (TYLENOL) tablet 650 mg  650 mg Oral Q6H PRN [place] SirenSainani, Vivek J, MD       Or  . acetaminophen (TYLENOL) suppository 650 mg  650 mg Rectal Q6H PRN [place] SirenSainani, Vivek J, MD      . lactulose (CHRONULAC) 10 GM/15ML solution 20 g  20 g Oral Once Sharman CheekStafford, Phillip, MD      . LORazepam (ATIVAN) injection 1 mg  1 mg Intravenous Q2H PRN [place] SirenSainani, Vivek J, MD      . morphine 2 MG/ML injection 2 mg  2 mg Intravenous Q2H PRN [place] SirenSainani, Vivek J, MD      . ondansetron (ZOFRAN) tablet 4 mg  4 mg Oral Q6H PRN [place] SirenSainani, Vivek J, MD       Or  . ondansetron (ZOFRAN) injection 4 mg  4 mg Intravenous Q6H PRN [place] SirenSainani, Vivek J, MD         No Known Allergies    Past Medical History:  Diagnosis Date  . Adjustment disorder with depressed mood   . Alcohol abuse 01/17/2016  . Allergy   . Anxiety   . Cellulitis of arm, left 08/18/2016  . COPD (chronic obstructive pulmonary disease) (HCC)   . ED (erectile dysfunction)   . Elevated glucose   . Elevated serum glutamic pyruvic transaminase (SGPT) level   . Emphysema lung (HCC)   .  GERD (gastroesophageal reflux disease)   . Hypertension   . Hypertriglyceridemia   . Liver disease   . Neuropathy   . Tobacco use       Past Surgical History:  Procedure Laterality Date  . EYE SURGERY     cataract surgery  . KNEE SURGERY     arthroscopic  . SEPTOPLASTY  Feb 2016  . TONSILLECTOMY       The patient has a family history of   shoulder   REVIEW OF SYSTEMS: Patient currently denies pain, but is confused. NO meaningful ROS can be obtained.   PHYSICAL EXAM:  Physical Exam  Constitutional: He appears lethargic and malnourished. He appears unhealthy. No distress.  HENT:  Head: Normocephalic and atraumatic.  Eyes: Pupils are equal, round, and reactive to light.  Conjunctivae are normal.  Neck: Trachea normal. No JVD present. Carotid bruit is not present.  Cardiovascular: S1 normal and S2 normal. Tachycardia present. Exam reveals no S4.  Pulmonary/Chest: He has decreased breath sounds in the right lower field and the left lower field. He has no wheezes. He has no rales.  Abdominal: Soft. Bowel sounds are normal. He exhibits shifting dullness, distension and fluid wave. There is no hepatosplenomegaly. There is no tenderness.  Neurological: He appears lethargic. He is agitated. He displays weakness. He exhibits abnormal muscle tone. Coordination abnormal.  Skin: Bruising, ecchymosis, purpura and rash noted. He is diaphoretic.  Psychiatric: His affect is blunt. He is not agitated. He is apathetic. He exhibits disordered thought content and abnormal remote memory.  Vitals reviewed.   DATA REVIEWED: CBC, CMP

## 2017-10-13 DIAGNOSIS — L8990 Pressure ulcer of unspecified site, unspecified stage: Secondary | ICD-10-CM

## 2017-10-13 MED ORDER — BIOTENE DRY MOUTH MT LIQD: 15.0000 mL | OROMUCOSAL | Status: DC | PRN

## 2017-10-13 MED ORDER — ONDANSETRON 4 MG PO TBDP
4.0000 mg | ORAL_TABLET | Freq: Four times a day (QID) | ORAL | Status: DC | PRN
  Filled 2017-10-13: qty 1

## 2017-10-13 MED ORDER — LORAZEPAM 2 MG/ML IJ SOLN
1.0000 mg | INTRAMUSCULAR | Status: DC | PRN
  Administered 2017-10-14: 1 mg via INTRAVENOUS
  Filled 2017-10-13: qty 1

## 2017-10-13 MED ORDER — MORPHINE SULFATE (PF) 2 MG/ML IV SOLN: 1.0000 mg | INTRAVENOUS | Status: DC | PRN

## 2017-10-13 MED ORDER — HALOPERIDOL LACTATE 2 MG/ML PO CONC
0.5000 mg | ORAL | Status: DC | PRN
  Filled 2017-10-13: qty 0.3

## 2017-10-13 MED ORDER — SODIUM CHLORIDE 0.9% FLUSH: 3.0000 mL | INTRAVENOUS | Status: DC | PRN

## 2017-10-13 MED ORDER — LORAZEPAM 2 MG/ML PO CONC: 1.0000 mg | ORAL | Status: DC | PRN

## 2017-10-13 MED ORDER — NYSTATIN 100000 UNIT/GM EX POWD
Freq: Three times a day (TID) | CUTANEOUS | Status: DC | PRN
  Filled 2017-10-13: qty 15

## 2017-10-13 MED ORDER — HALOPERIDOL 0.5 MG PO TABS
0.5000 mg | ORAL_TABLET | ORAL | Status: DC | PRN
  Filled 2017-10-13: qty 1

## 2017-10-13 MED ORDER — LORAZEPAM 2 MG/ML IJ SOLN: 1.0000 mg | INTRAMUSCULAR | Status: DC | PRN

## 2017-10-13 MED ORDER — SODIUM CHLORIDE 0.9 % IV SOLN
12.5000 mg | Freq: Four times a day (QID) | INTRAVENOUS | Status: DC | PRN
  Filled 2017-10-13: qty 0.5

## 2017-10-13 MED ORDER — ATROPINE SULFATE 1 % OP SOLN
4.0000 [drp] | OPHTHALMIC | Status: DC | PRN
  Filled 2017-10-13: qty 2

## 2017-10-13 MED ORDER — SODIUM CHLORIDE 0.9 % IV SOLN: 250.0000 mL | INTRAVENOUS | Status: DC | PRN

## 2017-10-13 MED ORDER — MAGIC MOUTHWASH
15.0000 mL | Freq: Four times a day (QID) | ORAL | Status: DC | PRN
  Filled 2017-10-13: qty 15

## 2017-10-13 MED ORDER — MORPHINE SULFATE (CONCENTRATE) 10 MG/0.5ML PO SOLN: 5.0000 mg | ORAL | Status: DC | PRN

## 2017-10-13 MED ORDER — DIPHENHYDRAMINE HCL 50 MG/ML IJ SOLN: 12.5000 mg | INTRAMUSCULAR | Status: DC | PRN

## 2017-10-13 MED ORDER — SODIUM CHLORIDE 0.9% FLUSH
3.0000 mL | Freq: Two times a day (BID) | INTRAVENOUS | Status: DC
  Administered 2017-10-14: 3 mL via INTRAVENOUS

## 2017-10-13 MED ORDER — ONDANSETRON HCL 4 MG/2ML IJ SOLN: 4.0000 mg | Freq: Four times a day (QID) | INTRAMUSCULAR | Status: DC | PRN

## 2017-10-13 MED ORDER — POLYVINYL ALCOHOL 1.4 % OP SOLN
1.0000 [drp] | Freq: Four times a day (QID) | OPHTHALMIC | Status: DC | PRN
  Filled 2017-10-13: qty 15

## 2017-10-13 MED ORDER — LORAZEPAM 1 MG PO TABS: 1.0000 mg | ORAL_TABLET | ORAL | Status: DC | PRN

## 2017-10-13 MED ORDER — TEMAZEPAM 7.5 MG PO CAPS: 7.5000 mg | ORAL_CAPSULE | Freq: Every evening | ORAL | Status: DC | PRN

## 2017-10-13 MED ORDER — HALOPERIDOL LACTATE 5 MG/ML IJ SOLN: 0.5000 mg | INTRAMUSCULAR | Status: DC | PRN

## 2017-10-13 NOTE — Progress Notes (Signed)
   10/13/17 1808  Clinical Encounter Type  Visited With Family  Visit Type Initial;Spiritual support  Referral From Nurse  Consult/Referral To Chaplain  Spiritual Encounters  Spiritual Needs Emotional;Grief support;Prayer   Chaplain arrived to find patient's spouse Lovey Newcomer, her sister, a family friend and patient's daughter in hallway.  Chaplain met with family in waiting room, offering emotional support and active and reflective listening.  Chaplain provided grief education and support as family shared their perspectives of patient's health and the role he plays in each of their lives and the individual nature of grief.  Patient's spouse and patient's daughter appear to have different perspectives on fluids being given to patient at end of life though they have been in conversation with physician per their report.  Chaplain spoke to family about the palliative care team and encouraged them to ask questions of the patient care team as well.  Chaplain prayed with family for strength and support for them and ease for the patient, then encouraged family to page chaplain as needed.  Chaplain spoke to patient's outgoing and incoming nurses regarding palliative care consult which they believe has been made.

## 2017-10-13 NOTE — Progress Notes (Signed)
[facility] [place] [facility]   PATIENT [facility]: [facility]    MR#:  [id number]  DATE OF BIRTH:  [birth year]  SUBJECTIVE:  CHIEF COMPLAINT:   Chief Complaint  Patient presents with  . Fall  Discussed with family at length with all questions answered-to be discharged home on tomorrow with home hospice  REVIEW OF SYSTEMS:  CONSTITUTIONAL: No fever, fatigue or weakness.  EYES: No blurred or double vision.  EARS, NOSE, AND THROAT: No tinnitus or ear pain.  RESPIRATORY: No cough, shortness of breath, wheezing or hemoptysis.  CARDIOVASCULAR: No chest pain, orthopnea, edema.  GASTROINTESTINAL: No nausea, vomiting, diarrhea or abdominal pain.  GENITOURINARY: No dysuria, hematuria.  ENDOCRINE: No polyuria, nocturia,  HEMATOLOGY: No anemia, easy bruising or bleeding SKIN: No rash or lesion. MUSCULOSKELETAL: No joint pain or arthritis.   NEUROLOGIC: No tingling, numbness, weakness.  PSYCHIATRY: No anxiety or depression.   ROS  DRUG ALLERGIES:  No Known Allergies  VITALS:  Blood pressure 101/64, pulse (!) 117, temperature 98.1 F (36.7 C), temperature source Oral, resp. rate 16, height 5\' 10"  (1.778 m), weight 83.5 kg, SpO2 91 %.  PHYSICAL EXAMINATION:  GENERAL:  62 y.o.-year-old patient lying in the bed with no acute distress.  EYES: Pupils equal, round, reactive to light and accommodation. No scleral icterus. Extraocular muscles intact.  HEENT: Head atraumatic, normocephalic. Oropharynx and nasopharynx clear.  NECK:  Supple, no jugular venous distention. No thyroid enlargement, no tenderness.  LUNGS: Normal breath sounds bilaterally, no wheezing, rales,rhonchi or crepitation. No use of accessory muscles of respiration.  CARDIOVASCULAR: S1, S2 normal. No murmurs, rubs, or gallops.  ABDOMEN: Soft, nontender, nondistended. Bowel sounds present. No organomegaly or mass.  EXTREMITIES: No pedal edema, cyanosis, or clubbing.  NEUROLOGIC: Cranial nerves II through  XII are intact. Muscle strength 5/5 in all extremities. Sensation intact. Gait not checked.  PSYCHIATRIC: The patient is alert and oriented x 3.  SKIN: No obvious rash, lesion, or ulcer.   Physical Exam LABORATORY PANEL:   CBC Recent Labs  Lab 07-Jan-2018 0940  WBC 17.0*  HGB 11.2*  HCT 32.9*  PLT 110*   ------------------------------------------------------------------------------------------------------------------  Chemistries  Recent Labs  Lab 07-Jan-2018 0940  NA 131*  K 5.0  CL 93*  CO2 27  GLUCOSE 97  BUN 28*  CREATININE 3.35*  CALCIUM 8.7*  AST 62*  ALT 23  ALKPHOS 194*  BILITOT 1.7*   ------------------------------------------------------------------------------------------------------------------  Cardiac Enzymes No results for input(s): TROPONINI in the last 168 hours. ------------------------------------------------------------------------------------------------------------------  RADIOLOGY:  Dg Chest 1 View  Result Date: 06-Feb-2018 CLINICAL DATA:  Weakness and tachycardia. EXAM: CHEST  1 VIEW COMPARISON:  September 28, 2017 FINDINGS: Atelectasis in the bases. No pneumothorax. The heart, hila, mediastinum, lungs, and pleura are otherwise unchanged and unremarkable. IMPRESSION: Bibasilar atelectasis. Lungs otherwise clear. Electronically Signed   By: Gerome Samavid  Williams III M.D   On: 005-Dec-2019 09:47   Ct Head Wo Contrast  Result Date: 06-Feb-2018 CLINICAL DATA:  Reoccurring falls. EXAM: CT HEAD WITHOUT CONTRAST TECHNIQUE: Contiguous axial images were obtained from the base of the skull through the vertex without intravenous contrast. COMPARISON:  December 18, 2016 FINDINGS: Brain: No subdural, epidural, or subarachnoid hemorrhage. Cerebellum, brainstem, and basal cisterns are normal. Ventricles and sulci are unremarkable. Mild white matter changes. No acute cortical ischemia infarct. No mass effect or midline shift. Vascular: Calcified atherosclerosis in the intracranial  carotids. Skull: Normal. Negative for fracture or focal lesion. Sinuses/Orbits: No acute finding. Other: None. IMPRESSION: No  acute abnormalities. Electronically Signed   By: Gerome Sam III M.D   On: 13-Oct-2017 10:57   Dg Foot Complete Right  Result Date: 2017-10-13 CLINICAL DATA:  Right foot swelling without redness. No trauma. EXAM: RIGHT FOOT COMPLETE - 3+ VIEW COMPARISON:  None. FINDINGS: Soft tissue swelling seen over the top of the foot. There is a mildly displaced fracture at the base of the proximal fifth phalanx which is age indeterminate but suggestive of and acute to subacute fracture. No other definitive fractures noted. IMPRESSION: 1. There is a fracture at the base of the proximal fifth phalanx which is age indeterminate but suspicious for an acute or subacute fracture. 2. Significant soft tissue swelling over the top of the foot. Electronically Signed   By: Gerome Sam III M.D   On: 13-Oct-2017 09:45    ASSESSMENT AND PLAN:  62 year old male with past medical history of chronic liver disease secondary to alcohol abuse, history of hepatic encephalopathy, recurrent ascites with paracentesis, alcohol withdrawal who presents to the hospital due to altered mental status, weakness, fall and noted to be in acute on chronic renal failure with hepatic encephalopathy.  1.  Chronic liver disease-secondary to alcohol abuse. 2.  Hepatic encephalopathy 3.  Acute on chronic renal failure 4.  Anemia of chronic disease 5.  Thrombocytopenia 6.  COPD with ongoing tobacco abuse  Prognosis terminal, discussion with family at length with all questions answered, for home with home hospice on tomorrow, continue comfort care measures  All the records are reviewed and case discussed with Care Management/Social Workerr. Management plans discussed with the patient, family and they are in agreement.  CODE STATUS: dnr  TOTAL TIME TAKING CARE OF THIS PATIENT: 45 minutes.     POSSIBLE D/C IN 1  DAYS, DEPENDING ON CLINICAL CONDITION.   Evelena Asa Ragan Reale M.D on 10/13/2017   Between 7am to 6pm - Pager - [phone]  After 6pm go to [email or web address] - password [email or web address]  [facility]  Office  [phone]  CC: Primary care physician; Dorcas Carrow, DO  Note: This dictation was prepared with Dragon dictation along with smaller phrase technology. Any transcriptional errors that result from this process are unintentional.

## 2017-10-14 DIAGNOSIS — Z7189 Other specified counseling: Secondary | ICD-10-CM

## 2017-10-14 DIAGNOSIS — Z515 Encounter for palliative care: Secondary | ICD-10-CM

## 2017-10-14 DIAGNOSIS — K7031 Alcoholic cirrhosis of liver with ascites: Secondary | ICD-10-CM

## 2017-10-14 MED ORDER — MORPHINE SULFATE (CONCENTRATE) 10 MG/0.5ML PO SOLN
5.0000 mg | ORAL | Status: DC | PRN
  Filled 2017-10-14: qty 1

## 2017-10-14 MED ORDER — SCOPOLAMINE 1 MG/3DAYS TD PT72: 1.0000 | MEDICATED_PATCH | TRANSDERMAL | 1 refills | Status: AC

## 2017-10-14 MED ORDER — ATROPINE SULFATE 1 % OP SOLN: 4.0000 [drp] | OPHTHALMIC | 12 refills | Status: AC | PRN

## 2017-10-14 MED ORDER — GLYCOPYRROLATE 0.2 MG/ML IJ SOLN
0.2000 mg | Freq: Four times a day (QID) | INTRAMUSCULAR | Status: DC
  Filled 2017-10-14 (×3): qty 1

## 2017-10-14 MED ORDER — MORPHINE SULFATE (PF) 2 MG/ML IV SOLN: 2.0000 mg | INTRAVENOUS | Status: DC | PRN

## 2017-10-14 MED ORDER — LORAZEPAM 2 MG/ML PO CONC: 1.0000 mg | ORAL | 0 refills | Status: AC | PRN

## 2017-10-14 MED ORDER — MORPHINE SULFATE (CONCENTRATE) 10 MG/0.5ML PO SOLN: 5.0000 mg | ORAL | 0 refills | Status: AC | PRN

## 2017-10-14 MED ORDER — GLYCOPYRROLATE 0.2 MG/ML IJ SOLN
0.4000 mg | INTRAMUSCULAR | Status: AC
  Administered 2017-10-14: 0.4 mg via INTRAVENOUS
  Filled 2017-10-14: qty 2

## 2017-11-03 NOTE — Progress Notes (Signed)
Chaplain was called for grief support. Chaplain  maintained space for grieving and Carleene Coopermaitian a pastoral presence. Chaplain tried to faciliate the return of a priest to bless the family.    2017/08/14 1500  Clinical Encounter Type  Visited With Patient and family together  Visit Type Death  Referral From Nurse  Spiritual Encounters  Spiritual Needs Prayer;Grief support

## 2017-11-03 NOTE — Progress Notes (Signed)
New hospice home referral received from [facility] following a Palliative Medicine consult. Patient is a 62 year old man with a known history of alcohol abuse with associated liver cirrhosis, hepatic encephalopathy, recurrent ascites (paracentesis 6.2L removed 10/09/17 and 5.7L on 09/13/17), chronic venous stasis, COPD with ongoing tobacco abuse, HTN, neuropathy, anxiety, depression admitted on 10/21/2017 with fall and AMS with ammonia level 90 and acute kidney injury s/t dehydration.  Family met this morning with Palliative NP Vinie Sill and they have chosen to focus on comfort with transfer to the hospice home. Writer met in the family room with patient's wife Devin Bryant and her brother Devin Bryant to initiate education regarding hospice services, philosophy and team approach to care with good understanding voiced. Questions answered, consents signed. Patient seen lying in bed, eyes closed, faint secretions noted, no response to verbal stimuli. Mouth care provided during visit by staff RN Steffanie Dunn, patient tolerated well. Did not appear to rouse. He has required IV lorazepam for symptom management. Education and emotional support provided to family. Plan is for transfer to the hospice home this afternoon via EMS with signed DNR in place. Hospital care team updated. Will continue to follow through discharge. Thank you for the opportunity to be involved in the care of this patient. Flo Shanks RN, BSN, [facility] [place], hospital liaison [phone]

## 2017-11-03 NOTE — Care Management (Signed)
Received telephone call from Donato SchultzBetty Clutz, patients sister in law. She states that the family now wishes for the patient to go to the hospice home. She states this is his wife Sandy's choice too. I told her we would need to get a palliative cave consult to determine if patient was appropriate for the hospice home. She is in ageement with POC.  CSW has paged Alecia with palliative care. RNCM will follow.

## 2017-11-03 NOTE — Progress Notes (Signed)
Chaplain visited while rounding. Pt was being seen by NP. Family wanted Idelle Crouch to do last rites. Chaplain contacted [employer] and met priest at room. No family was present. Father Evette Doffing met daughter in the hallway and informed family last rites was conducted. Chaplain prayed for wife and brother n law for strength. Chaplin   2017-10-23 1000  Clinical Encounter Type  Visited With Family;Patient and family together  Visit Type Spiritual support  Referral From Chaplain  Spiritual Encounters  Spiritual Needs Prayer;Ritual   will continue to follow.

## 2017-11-03 NOTE — Consult Note (Signed)
Consultation Note Date: 2017-10-21   Patient Name: Devin Bryant  DOB: 03/30/1955  MRN: [id number]  Age / Sex: 62 y.o., male  PCP: Valerie Roys, DO Referring Physician: Gladstone Lighter, MD  Reason for Consultation: Establishing goals of care and Hospice Evaluation  HPI/Patient Profile: 62 y.o. male  with past medical history of alcohol abuse with associated liver cirrhosis, hepatic encephalopathy, recurrent ascites (paracentesis 6.2L removed 10/09/17 and 5.7L on 09/13/17), chronic venous stasis, COPD with ongoing tobacco abuse, HTN, neuropathy, anxiety, depression admitted on 11/01/2017 with fall and AMS with ammonia level 90 and acute kidney injury s/t dehydration. Family has requested comfort care. Living Will and HCPOA reviewed in electronic chart.   Clinical Assessment and Goals of Care: I met today at Devin Bryant bedside with wife, 2 daughters, and brother-in-law. We fully discussed current status and multiorgan failure with liver and kidney dysfunction. He has been declining at home with poor intake and falls and was continuing to drink alcohol and smoke tobacco the morning of admission. They had noticed increased periods of confusion that was likely d/t ammonia and worsening liver disease.   Wife, Devin Bryant, is distressed about where his care would be at EOL. We fully discussed hospice facility which is their preference and they understand that prognosis is days to less than 2 weeks. We discussed goal for comfort and clarified that IVF are not medically indicated given they would provide more harm than benefit. Family was struggling with his Living Will indicating his desire for artificial hydration. They feel comfortable that we are fulfilling his wishes but providing the best care for him at this point.   They had many appropriate questions and I eased their minds that we will provide care her at EOL  until hospice facility bed available, another hospice facility pursued, or in the event he declines and is unstable to transfer to hospice facility. Chaplain assisting with coordinating Anointing of the Sick via local priest at family request - they would like to be present for this. They are also interested in hospice bereavement counseling and supportive services. Emotional support provided to family at bedside.   Primary Decision Maker HCPOA wife Devin Bryant    SUMMARY OF RECOMMENDATIONS   - DNR - Full comfort care - Pursue hospice facility  Code Status/Advance Care Planning:  DNR   Symptom Management:   SOB/Pain: Morphine IV or SL prn.   Anxiety/agitation: Lorazepam IV or po every 4 hours prn. Would utilize lorazepam over haldol given his recent alcohol abuse and high likelihood of withdrawal. Low threshold for scheduled dosing.   Secretions: Robinul 0.4 mg IV x 1. Robinul 0.2 mg IV QID.   Palliative Prophylaxis:   Delirium Protocol, Frequent Pain Assessment, Oral Care and Turn Reposition  Additional Recommendations (Limitations, Scope, Preferences):  Full Comfort Care  Psycho-social/Spiritual:   Desire for further Chaplaincy support:yes - Chaplain assisting with coordinating Anointing of the Sick via local priest  Additional Recommendations: Caregiving  Support/Resources, Education on Hospice and Grief/Bereavement Support  Prognosis:   < 2  weeks  Discharge Planning: Hospice facility      Primary Diagnoses: Present on Admission: . Hepatic encephalopathy ([place])   I have reviewed the medical record, interviewed the patient and family, and examined the patient. The following aspects are pertinent.  Past Medical History:  Diagnosis Date  . Adjustment disorder with depressed mood   . Alcohol abuse 01/17/2016  . Allergy   . Anxiety   . Cellulitis of arm, left 08/18/2016  . COPD (chronic obstructive pulmonary disease) ([place])   . ED (erectile dysfunction)   .  Elevated glucose   . Elevated serum glutamic pyruvic transaminase (SGPT) level   . Emphysema lung ([place])   . GERD (gastroesophageal reflux disease)   . Hypertension   . Hypertriglyceridemia   . Liver disease   . Neuropathy   . Tobacco use    Social History   Socioeconomic History  . Marital status: Married    Spouse name: Not on file  . Number of children: Not on file  . Years of education: Not on file  . Highest education level: Not on file  Occupational History  . Not on file  Social Needs  . Financial resource strain: Not on file  . Food insecurity:    Worry: Patient refused    Inability: Patient refused  . Transportation needs:    Medical: Patient refused    Non-medical: Patient refused  Tobacco Use  . Smoking status: Current Every Day Smoker    Packs/day: 1.00    Years: 40.00    Pack years: 40.00    Types: Cigarettes  . Smokeless tobacco: Never Used  Substance and Sexual Activity  . Alcohol use: Yes    Alcohol/week: 4.0 standard drinks    Types: 4 Standard drinks or equivalent per week    Comment: daughter states patient has been drinking heavily for the past three years with increased drinking over the past 6 months.  . Drug use: Not Currently    Types: Marijuana  . Sexual activity: Not Currently  Lifestyle  . Physical activity:    Days per week: Patient refused    Minutes per session: Patient refused  . Stress: Patient refused  Relationships  . Social connections:    Talks on phone: Patient refused    Gets together: Patient refused    Attends religious service: Patient refused    Active member of club or organization: Patient refused    Attends meetings of clubs or organizations: Patient refused    Relationship status: Patient refused  Other Topics Concern  . Not on file  Social History Narrative  . Not on file   Family History  Problem Relation Age of Onset  . Diabetes Mother   . CAD Mother   . Cancer Father        lung  . Heart disease Father    . Heart attack Father   . COPD Sister   . Heart disease Brother   . Heart attack Brother   . COPD Brother   . Hypertension Neg Hx   . Stroke Neg Hx    Scheduled Meds: . lactulose  20 g Oral Once  . sodium chloride flush  3 mL Intravenous Q12H   Continuous Infusions: . sodium chloride    . chlorproMAZINE (THORAZINE) IV     PRN Meds:.sodium chloride, antiseptic oral rinse, atropine, chlorproMAZINE (THORAZINE) IV, diphenhydrAMINE, haloperidol **OR** haloperidol **OR** haloperidol lactate, [DISCONTINUED] LORazepam **OR** LORazepam **OR** LORazepam, magic mouthwash, morphine injection, morphine CONCENTRATE **OR** morphine CONCENTRATE, nystatin,  ondansetron **OR** ondansetron (ZOFRAN) IV, polyvinyl alcohol, sodium chloride flush, temazepam No Known Allergies Review of Systems  Unable to perform ROS: Acuity of condition    Physical Exam  Constitutional: He appears well-developed.  HENT:  Head: Normocephalic and atraumatic.  Cardiovascular: Tachycardia present.  Pulmonary/Chest: No accessory muscle usage. No tachypnea.  Mild-moderate labored breathing, audible secretions  Abdominal: He exhibits distension and ascites.  Neurological: He is unresponsive.  Nursing note and vitals reviewed.   Vital Signs: BP 101/64 (BP Location: Right Arm)   Pulse (!) 117   Temp 98.1 F (36.7 C) (Oral)   Resp 16   Ht 5' 10"  (1.778 m)   Wt 83.5 kg   SpO2 91%   BMI 26.40 kg/m  Pain Scale: Faces   Pain Score: Asleep   SpO2: SpO2: 91 % O2 Device:SpO2: 91 % O2 Flow Rate: .O2 Flow Rate (L/min): 3 L/min  IO: Intake/output summary:   Intake/Output Summary (Last 24 hours) at 11-03-2017 0920 Last data filed at 2017/11/03 0656 Gross per 24 hour  Intake -  Output 1 ml  Net -1 ml    LBM: Last BM Date: 10/13/17 Baseline Weight: Weight: 83.5 kg Most recent weight: Weight: 83.5 kg     Palliative Assessment/Data: 20%     Time Total: 70 min  Greater than 50%  of this time was spent  counseling and coordinating care related to the above assessment and plan.  Signed by: Vinie Sill, NP Palliative Medicine Team Pager # [phone] (M-F 8a-5p) Team Phone # [phone] (Nights/Weekends)

## 2017-11-03 NOTE — Progress Notes (Signed)
Notified by CSW Fredric MareBailey that patient had died. Referral and hospice home notified. Thank you. Dayna BarkerKaren Robertson RN, BSN, [facility], hospital Liaison [phone]

## 2017-11-03 NOTE — Progress Notes (Signed)
This nurse was notified by family that pt was no longer breathing. This nurse and MD Kalisetti assessed pt. MD Kalisetti pronounced death. Family made aware.

## 2017-11-03 NOTE — Death Summary Note (Signed)
   [facility] [place] [facility]    Death Note ------------------   Devin HusbandsDavid Bryant CSN:[id number],MRN:[id number] is a 62 y.o. male, Outpatient Primary MD for the patient is Devin Bryant, Devin P, DO   62 year old male with past medical history significant for alcohol cirrhosis, end-stage liver disease, hypertension, CKD was brought in secondary to fall and altered mental status. Patient has had recurrent hepatic encephalopathy's, rapid reaccumulating ascites fluid and declining condition.  Patient is a severe alcoholic. -Family wanted comfort care  -Appreciate palliative care consult.  Plan was to discharge patient to hospice home-however patient stopped breathing and was pronounced at 2 PM on 10/18/2017.  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 Nemiah CommanderKalisetti on  10/13/2017    @  2:00PM                  Enid BaasKALISETTI,Kerensa Nicklas M.D on 10/15/2017 at 2:19 PM  [facility] [place] [facility]    OFFICE [phone]  Total clinical and documentation time for today Under 30 minutes

## 2017-11-03 NOTE — Plan of Care (Signed)
  Problem: Education: Goal: Knowledge of General Education information will improve Description Including pain rating scale, medication(s)/side effects and non-pharmacologic comfort measures Outcome: Progressing   

## 2017-11-03 NOTE — Progress Notes (Addendum)
Per palliative team patient does meet criteria for the hospice facility. [facility]'s [facility]. [facility] is aware of above and is working on the referral.   B[employer] Hughes IncorporatedBailey Tomeeka Plaugher, LCSW [phone]

## 2017-11-03 NOTE — Progress Notes (Signed)
Patient's family member came out and said that she felt like he took a long pause and is not breathing again. Went in, no breath sounds and no heart beat on ausculation. Pronounced dead by Dr. Nemiah CommanderKalisetti at 2:00PM

## 2017-11-03 NOTE — Progress Notes (Addendum)
[facility] notified. Spoke with Daria Pasturesenise Lewis.

## 2017-11-03 NOTE — Progress Notes (Signed)
Family still at bedside. This nurse provided comfort. No questions from family at this time.

## 2017-11-03 NOTE — Progress Notes (Signed)
   [facility] [place] [facility]   PATIENT NAME: Jules HusbandsDavid Dixson    MR#:  [id number]  DATE OF BIRTH:  1956-01-15  SUBJECTIVE:  CHIEF COMPLAINT:   Chief Complaint  Patient presents with  . Fall   -Lying in bed, not responding.  Increased gurgling secretions. -Family requests hospice home  REVIEW OF SYSTEMS:  Review of Systems  Unable to perform ROS: Critical illness    DRUG ALLERGIES:  No Known Allergies  VITALS:  Blood pressure 101/64, pulse (!) 117, temperature 98.1 F (36.7 C), temperature source Oral, resp. rate 16, height 5\' 10"  (1.778 m), weight 83.5 kg, SpO2 91 %.  PHYSICAL EXAMINATION:  Physical Exam  GENERAL:  62 y.o.-year-old patient lying in the bed, critically ill appearing EYES: Pupils equal, round, reactive to light and accommodation. No scleral icterus.  Marland Kitchen.  HEENT: Head atraumatic, normocephalic. Oropharynx and nasopharynx clear.  NECK:  Supple, no jugular venous distention. No thyroid enlargement, no tenderness.  LUNGS: Moving air bilaterally, increased gurgling secretions CARDIOVASCULAR: S1, S2 normal. No murmurs, rubs, or gallops.  ABDOMEN: Soft, nontender, nondistended. Bowel sounds present. No organomegaly or mass.  EXTREMITIES: No pedal edema, cyanosis, or clubbing.  NEUROLOGIC: Patient is not responding.  PSYCHIATRIC: The patient is sedated SKIN: No obvious rash, lesion, or ulcer.    LABORATORY PANEL:   CBC Recent Labs  Lab 2017-09-16 0940  WBC 17.0*  HGB 11.2*  HCT 32.9*  PLT 110*   ------------------------------------------------------------------------------------------------------------------  Chemistries  Recent Labs  Lab 2017-09-16 0940  NA 131*  K 5.0  CL 93*  CO2 27  GLUCOSE 97  BUN 28*  CREATININE 3.35*  CALCIUM 8.7*  AST 62*  ALT 23  ALKPHOS 194*  BILITOT 1.7*   ------------------------------------------------------------------------------------------------------------------  Cardiac  Enzymes No results for input(s): TROPONINI in the last 168 hours. ------------------------------------------------------------------------------------------------------------------  RADIOLOGY:  No results found.  EKG:   Orders placed or performed during the hospital encounter of 2017-09-16  . ED EKG  . ED EKG  . EKG 12-Lead  . EKG 12-Lead    ASSESSMENT AND PLAN:   62 year old male with past medical history significant for alcohol cirrhosis, end-stage liver disease, hypertension, CKD was brought in secondary to fall and altered mental status. Patient has had recurrent hepatic encephalopathy's, rapid reaccumulating ascites fluid and declining condition.  Patient is a severe alcoholic. -Family wanted comfort care  Patient is currently resting, not alert enough.  No oral intake. -Discussed with daughter at bedside.  Family all agreeable for hospice home at this time.  Patient is eligible for hospice home -Appreciate palliative care consult.  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 Management plans discussed with the patient, family and they are in agreement.  CODE STATUS: DNR  TOTAL TIME TAKING CARE OF THIS PATIENT: 38 minutes.   POSSIBLE D/C IN 1-2 DAYS, DEPENDING ON CLINICAL CONDITION.   Enid BaasKALISETTI,Anzal Bartnick M.D on 10/20/2017 at 10:59 AM  Between 7am to 6pm - Pager - [phone]  After 6pm go to [email or web address] - Social research officer, governmentpassword EPAS [facility]  [facility]  Office  [phone]  CC: Primary care physician; Dorcas CarrowJohnson, Megan P, DO

## 2017-11-03 DEATH — deceased

## 2017-12-09 ENCOUNTER — Ambulatory Visit: Payer: BLUE CROSS/BLUE SHIELD | Admitting: Family Medicine

## 2018-07-01 IMAGING — US US ABDOMEN COMPLETE
1 series · 14 of 25 positions shown · non-contrast
Comparison: None.

CLINICAL DATA: Weakness.  Abdominal pain, distention

EXAM:
ABDOMEN ULTRASOUND COMPLETE

[Series 1: us abdomen complete · 0.20mm/px · 14 of 104 slices shown]
[im 1/104]
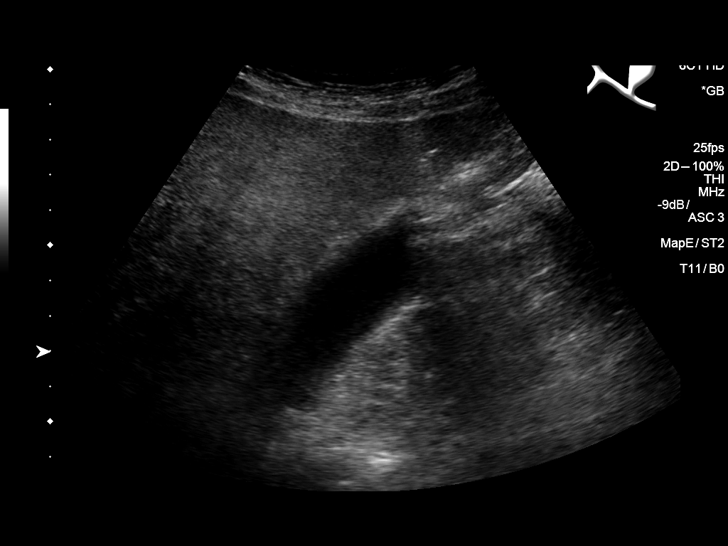
[im 9/104]
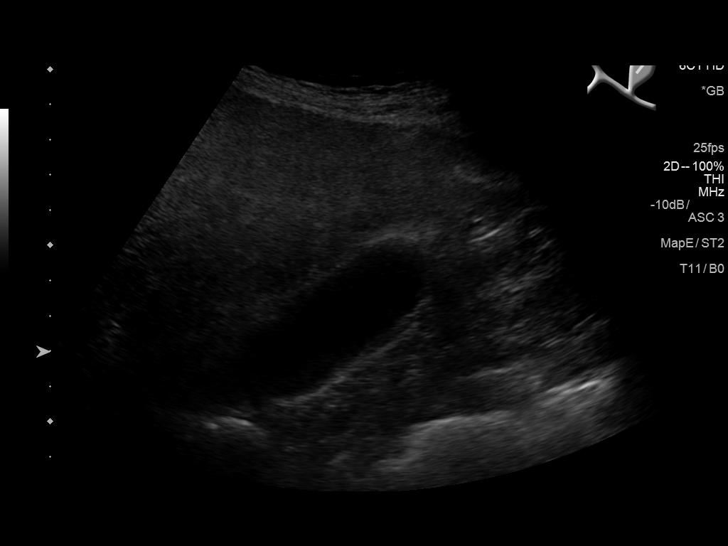
[im 18/104]
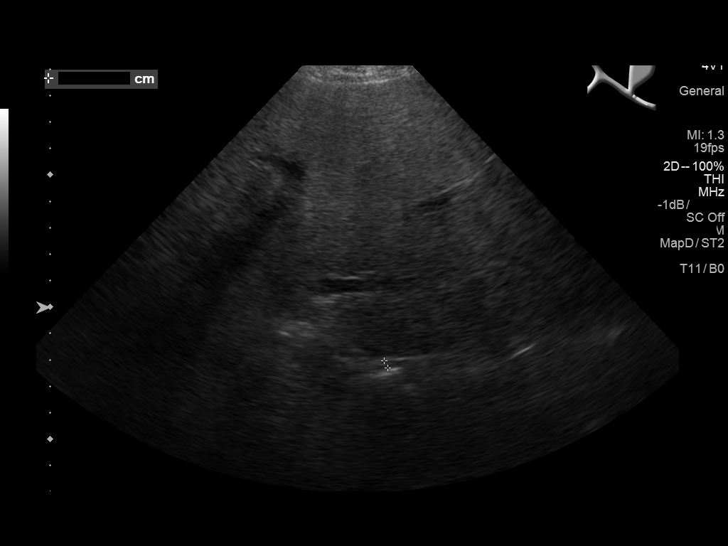
[im 26/104]
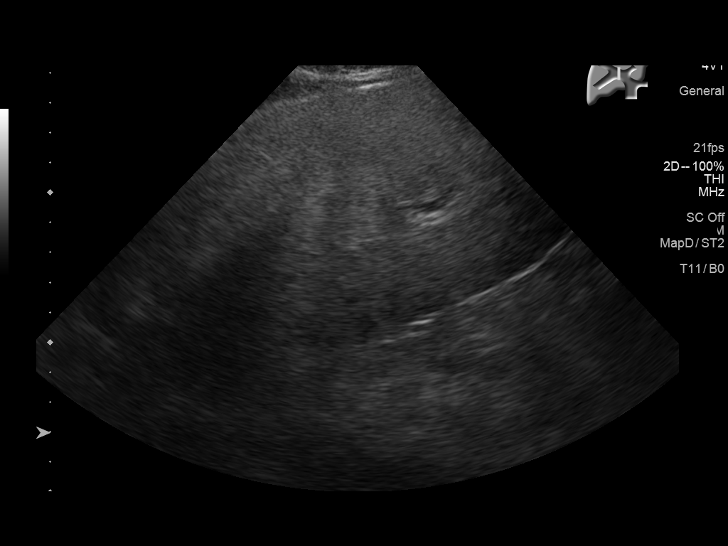
[im 35/104]
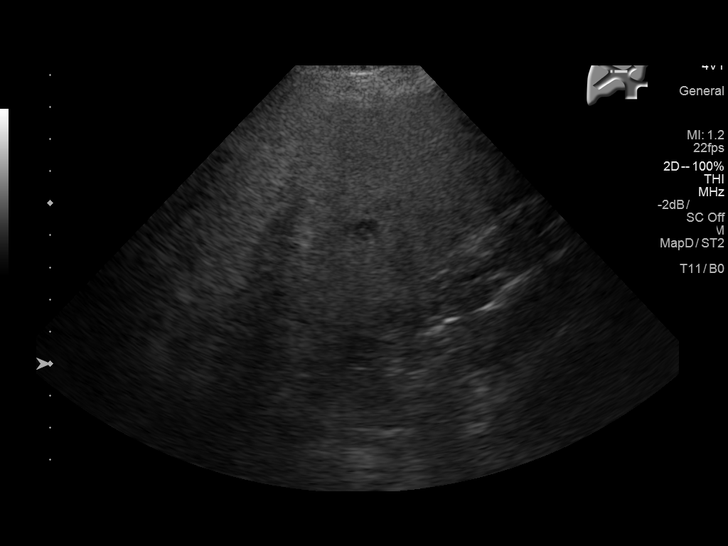
[im 39/104]
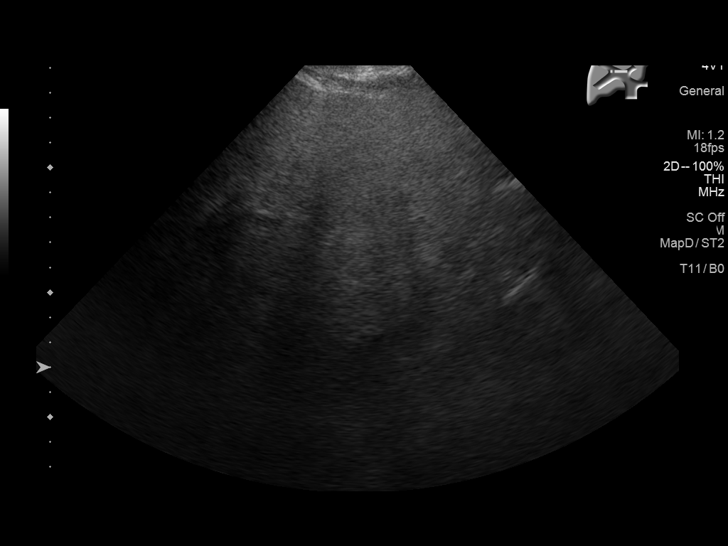
[im 48/104]
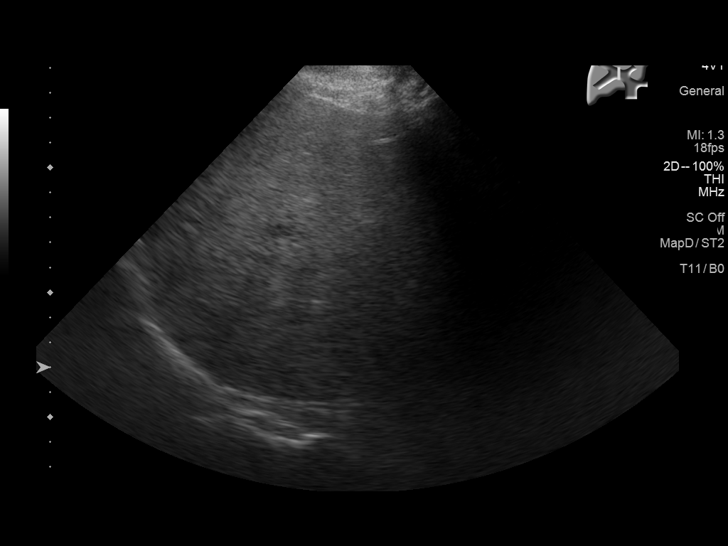
[im 56/104]
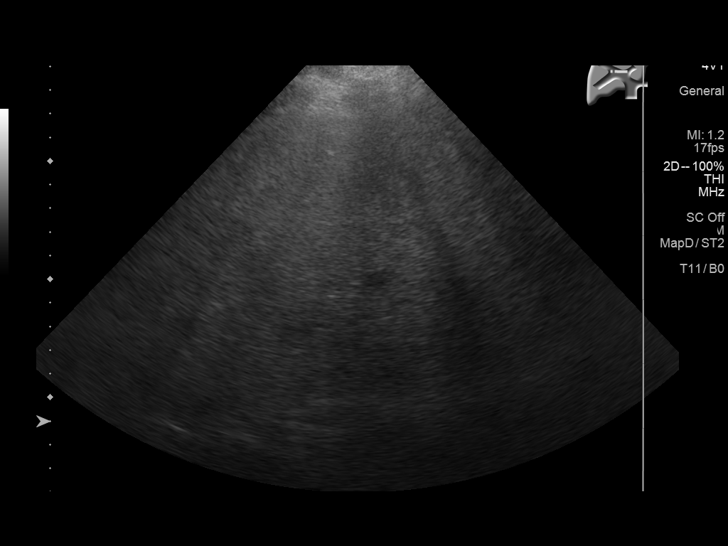
[im 65/104]
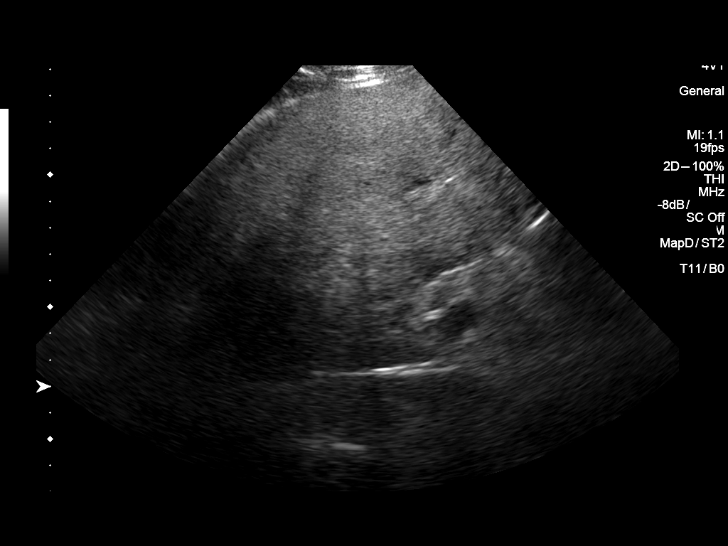
[im 69/104]
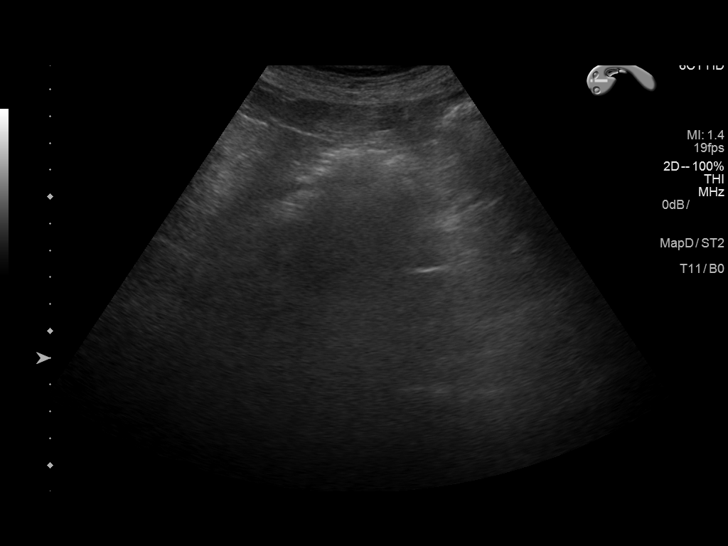
[im 78/104]
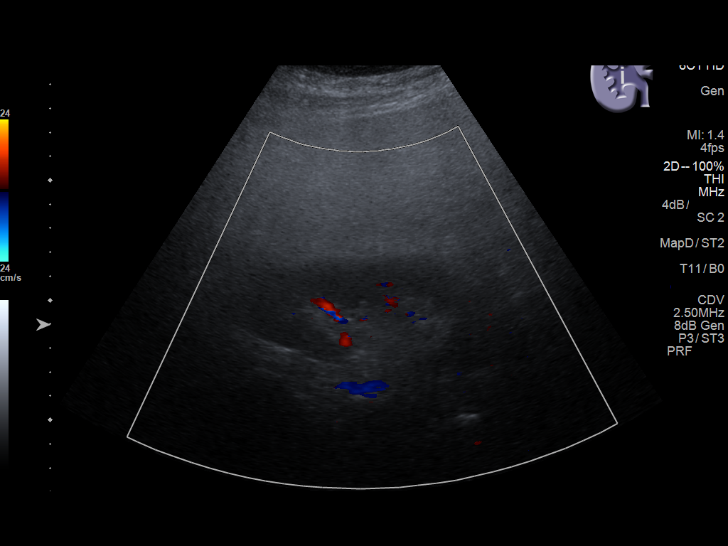
[im 86/104]
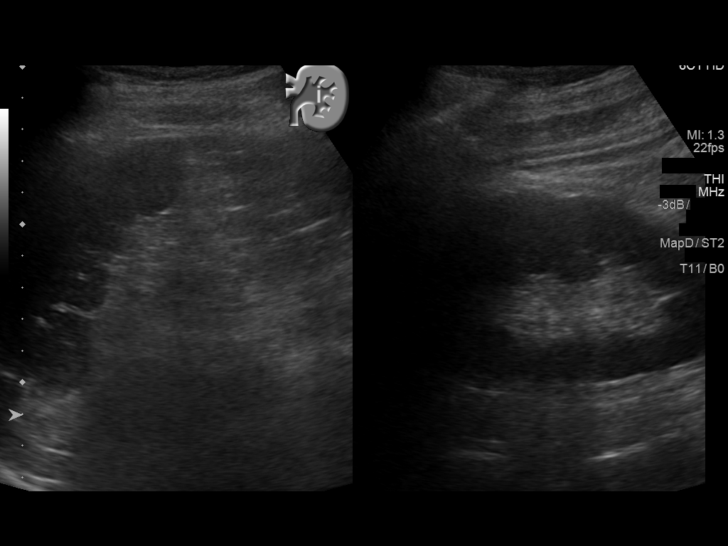
[im 95/104]
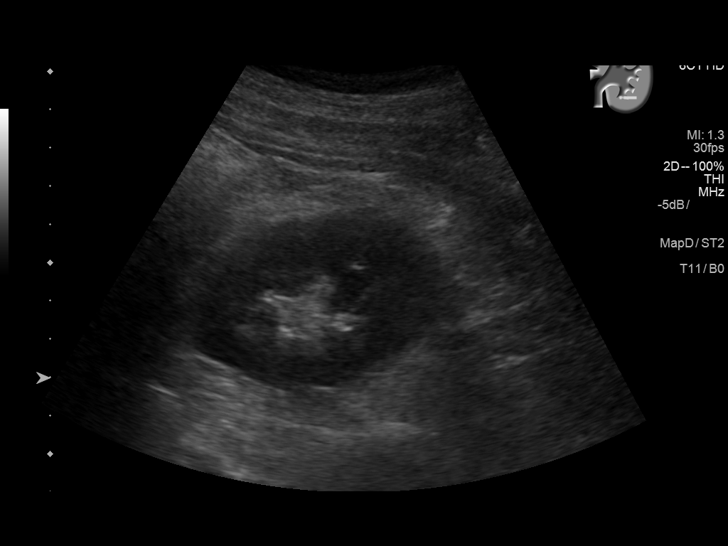
[im 104/104]
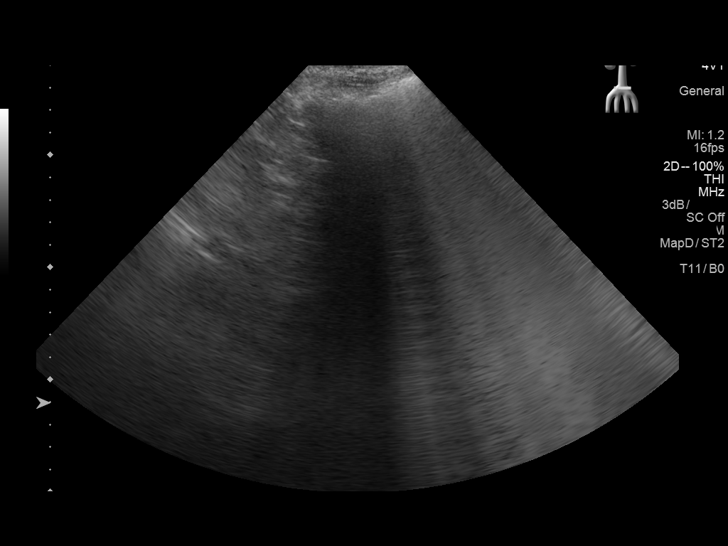

[14 of 25 positions shown; findings below may reference images not displayed]

FINDINGS: Gallbladder: Mild wall thickening, 5 mm. No visible stones. Negative
sonographic Tc.

Common bile duct: Diameter: Normal caliber, 3 mm

Liver: Increased echotexture compatible with fatty infiltration or
intrinsic liver disease. Liver appears enlarged, with a craniocaudal
length of 19 cm.

IVC: No abnormality visualized.

Pancreas: Not well visualized due to overlying bowel gas.

Spleen: Size and appearance within normal limits.

Right Kidney: Length: 12.2 cm. Echogenicity within normal limits. No
mass or hydronephrosis visualized.

Left Kidney: Length: 12.0 cm. Echogenicity within normal limits. No
mass or hydronephrosis visualized.

Abdominal aorta: Mildly ectatic at 2.9 cm within the mid aorta.

Other findings: None.
IMPRESSION: Diffusely increased echotexture throughout the liver compatible with
fatty infiltration or intrinsic liver disease. Liver appears
enlarged.

Mild gallbladder wall thickening without visible stones or
sonographic Murphy's sign. This may be related to liver disease.
